# Patient Record
Sex: Male | Born: 1945 | Hispanic: Refuse to answer | Smoking: Former smoker
Health system: Southern US, Community
[De-identification: ages and names within clinical notes are randomized; demographics above are authoritative.]

## PROBLEM LIST (undated history)

## (undated) DIAGNOSIS — K0530 Chronic periodontitis, unspecified: Secondary | ICD-10-CM

## (undated) DIAGNOSIS — Q2381 Bicuspid aortic valve: Secondary | ICD-10-CM

## (undated) DIAGNOSIS — Z951 Presence of aortocoronary bypass graft: Secondary | ICD-10-CM

## (undated) DIAGNOSIS — J189 Pneumonia, unspecified organism: Secondary | ICD-10-CM

## (undated) DIAGNOSIS — I330 Acute and subacute infective endocarditis: Secondary | ICD-10-CM

## (undated) DIAGNOSIS — D649 Anemia, unspecified: Secondary | ICD-10-CM

## (undated) DIAGNOSIS — I2510 Atherosclerotic heart disease of native coronary artery without angina pectoris: Secondary | ICD-10-CM

## (undated) DIAGNOSIS — I7121 Aneurysm of the ascending aorta, without rupture: Secondary | ICD-10-CM

## (undated) DIAGNOSIS — Q231 Congenital insufficiency of aortic valve: Secondary | ICD-10-CM

## (undated) DIAGNOSIS — I351 Nonrheumatic aortic (valve) insufficiency: Secondary | ICD-10-CM

## (undated) DIAGNOSIS — I712 Thoracic aortic aneurysm, without rupture: Secondary | ICD-10-CM

## (undated) DIAGNOSIS — Z954 Presence of other heart-valve replacement: Secondary | ICD-10-CM

## (undated) DIAGNOSIS — Z8774 Personal history of (corrected) congenital malformations of heart and circulatory system: Secondary | ICD-10-CM

## (undated) HISTORY — PX: HERNIA REPAIR: SHX51

## (undated) HISTORY — DX: Acute and subacute infective endocarditis: I33.0

---

## 1976-04-13 DIAGNOSIS — J189 Pneumonia, unspecified organism: Secondary | ICD-10-CM

## 1976-04-13 HISTORY — DX: Pneumonia, unspecified organism: J18.9

## 2016-04-09 ENCOUNTER — Emergency Department (HOSPITAL_COMMUNITY): Payer: Medicare Other

## 2016-04-09 ENCOUNTER — Emergency Department (HOSPITAL_COMMUNITY)
Admission: EM | Admit: 2016-04-09 | Discharge: 2016-04-09 | Disposition: A | Discharge status: Home / Self Care | Payer: Medicare Other | Attending: Emergency Medicine | Admitting: Emergency Medicine

## 2016-04-09 ENCOUNTER — Encounter (HOSPITAL_COMMUNITY): Payer: Self-pay | Admitting: *Deleted

## 2016-04-09 DIAGNOSIS — J40 Bronchitis, not specified as acute or chronic: Secondary | ICD-10-CM | POA: Insufficient documentation

## 2016-04-09 DIAGNOSIS — R05 Cough: Secondary | ICD-10-CM | POA: Diagnosis not present

## 2016-04-09 DIAGNOSIS — Z87891 Personal history of nicotine dependence: Secondary | ICD-10-CM | POA: Insufficient documentation

## 2016-04-09 MED ORDER — GUAIFENESIN-CODEINE 100-10 MG/5ML PO SYRP: 10.0000 mL | ORAL_SOLUTION | Freq: Three times a day (TID) | ORAL | 0 refills | Status: DC | PRN

## 2016-04-09 MED ORDER — AZITHROMYCIN 250 MG PO TABS: ORAL_TABLET | ORAL | 0 refills | Status: DC

## 2016-04-09 MED ORDER — PREDNISONE 20 MG PO TABS: 40.0000 mg | ORAL_TABLET | Freq: Every day | ORAL | 0 refills | Status: DC

## 2016-04-09 MED ORDER — ALBUTEROL SULFATE HFA 108 (90 BASE) MCG/ACT IN AERS
2.0000 | INHALATION_SPRAY | Freq: Once | RESPIRATORY_TRACT | Status: AC
  Administered 2016-04-09: 2 via RESPIRATORY_TRACT
  Filled 2016-04-09: qty 6.7

## 2016-04-09 NOTE — Discharge Instructions (Signed)
2 puffs of the inhaler 4 times a day as needed.  Drink plenty of fluids.  Tylenol if needed for fever.  Follow-up with your doctor for recheck.

## 2016-04-09 NOTE — ED Triage Notes (Signed)
Pt comes in with cough and congestion starting one week ago. NAD noted in triage. Pt denies any n/v/d.

## 2016-04-13 NOTE — ED Provider Notes (Signed)
[place] DEPT Provider Note   CSN: [id number] Arrival date & time: 04/09/16  1150     History   Chief Complaint Chief Complaint  Patient presents with  . Cough    HPI Charles [name] is a 71 y.o. male.  HPI   Charles [name] is a 71 y.o. male who presents to the Emergency Department complaining of cough and chest congestion for one week.  He states cough is productive of dark sputum.  He also reports having associated chest and nasal congestion and "scratchy" throat.  He has tried OTC cold medication w/o relief.  He denies chest pain, shortness of breath and fever.     History reviewed. No pertinent past medical history.  There are no active problems to display for this patient.   Past Surgical History:  Procedure Laterality Date  . HERNIA REPAIR         Home Medications    Prior to Admission medications   Medication Sig Start Date End Date Taking? Authorizing Provider  azithromycin (ZITHROMAX) 250 MG tablet Take first 2 tablets together, then 1 every day until finished. 04/09/16   Kareem Aul, PA-C  guaiFENesin-codeine (ROBITUSSIN AC) 100-10 MG/5ML syrup Take 10 mLs by mouth 3 (three) times daily as needed. 04/09/16   Lousie Calico, PA-C  predniSONE (DELTASONE) 20 MG tablet Take 2 tablets (40 mg total) by mouth daily. For 5 days 04/09/16   Kem Parkinson, PA-C    Family History No family history on file.  Social History Social History  Substance Use Topics  . Smoking status: Former Research scientist (life sciences)  . Smokeless tobacco: Never Used  . Alcohol use No     Allergies   Patient has no known allergies.   Review of Systems Review of Systems  Constitutional: Negative for activity change, appetite change, chills and fever.  HENT: Positive for congestion, rhinorrhea and sore throat. Negative for facial swelling and trouble swallowing.   Eyes: Negative for visual disturbance.  Respiratory: Positive for cough. Negative for chest tightness, shortness of breath,  wheezing and stridor.   Gastrointestinal: Negative for abdominal pain, nausea and vomiting.  Musculoskeletal: Negative for neck pain and neck stiffness.  Skin: Negative.   Neurological: Negative for dizziness, weakness, numbness and headaches.  Hematological: Negative for adenopathy.  Psychiatric/Behavioral: Negative for confusion.  All other systems reviewed and are negative.    Physical Exam Updated Vital Signs BP 130/58 (BP Location: Left Arm)   Pulse 74   Temp 97.6 F (36.4 C) (Oral)   Resp 16   Ht 6' (1.829 m)   Wt 117.9 kg   SpO2 93%   BMI 35.26 kg/m   Physical Exam  Constitutional: He is oriented to person, place, and time. He appears well-developed and well-nourished. No distress.  HENT:  Head: Normocephalic and atraumatic.  Right Ear: Tympanic membrane and ear canal normal.  Left Ear: Tympanic membrane and ear canal normal.  Nose: Mucosal edema and rhinorrhea present.  Mouth/Throat: Uvula is midline and mucous membranes are normal. No trismus in the jaw. No uvula swelling. No oropharyngeal exudate, posterior oropharyngeal edema, posterior oropharyngeal erythema or tonsillar abscesses.  Eyes: Conjunctivae are normal.  Neck: Normal range of motion and phonation normal. Neck supple. No Brudzinski's sign and no Kernig's sign noted.  Cardiovascular: Normal rate, regular rhythm and intact distal pulses.   No murmur heard. Pulmonary/Chest: Effort normal and breath sounds normal. No respiratory distress. He has no wheezes. He has no rales.  Coarse lung sounds bilaterally with scattered expiratory wheezes.  No rales.    Abdominal: Soft. He exhibits no distension. There is no tenderness. There is no rebound and no guarding.  Musculoskeletal: He exhibits no edema.  Lymphadenopathy:    He has no cervical adenopathy.  Neurological: He is alert and oriented to person, place, and time. Coordination normal.  Skin: Skin is warm and dry.  Nursing note and vitals reviewed.    ED  Treatments / Results  Labs (all labs ordered are listed, but only abnormal results are displayed) Labs Reviewed - No data to display  EKG  EKG Interpretation None       Radiology Dg Chest 2 View  Result Date: 04/09/2016 CLINICAL DATA:  Cold with cough and congestion for 1 week EXAM: CHEST  2 VIEW COMPARISON:  None. FINDINGS: There is no focal consolidation. There is mild left basilar atelectasis. There is no pleural effusion or pneumothorax. The heart and mediastinal contours are unremarkable. The osseous structures are unremarkable. IMPRESSION: No active cardiopulmonary disease. Electronically Signed   By: Kathreen Devoid   On: 04/09/2016 12:55     Procedures Procedures (including critical care time)  Medications Ordered in ED Medications  albuterol (PROVENTIL HFA;VENTOLIN HFA) 108 (90 Base) MCG/ACT inhaler 2 puff (2 puffs Inhalation Given 04/09/16 1328)     Initial Impression / Assessment and Plan / ED Course  I have reviewed the triage vital signs and the nursing notes.  Pertinent labs & imaging results that were available during my care of the patient were reviewed by me and considered in my medical decision making (see chart for details).  Clinical Course     Pt non-toxic appearing.  Vitals stable.  CXR neg  For PNA.    Albuterol MDI dispensed, will tx with prednisone, z pack and anti-tussive.  Pt agrees to PMD f/u if needed.   Final Clinical Impressions(s) / ED Diagnoses   Final diagnoses:  Bronchitis    New Prescriptions Discharge Medication List as of 04/09/2016  1:21 PM    START taking these medications   Details  azithromycin (ZITHROMAX) 250 MG tablet Take first 2 tablets together, then 1 every day until finished., Print    guaiFENesin-codeine (ROBITUSSIN AC) 100-10 MG/5ML syrup Take 10 mLs by mouth 3 (three) times daily as needed., Starting Thu 04/09/2016, Print    predniSONE (DELTASONE) 20 MG tablet Take 2 tablets (40 mg total) by mouth daily. For 5  days, Starting Thu 04/09/2016, [place], PA-C 04/13/16 2015    Nat Christen, MD 04/14/16 [phone]

## 2016-04-14 DIAGNOSIS — Z6834 Body mass index (BMI) 34.0-34.9, adult: Secondary | ICD-10-CM | POA: Diagnosis not present

## 2016-04-14 DIAGNOSIS — R0602 Shortness of breath: Secondary | ICD-10-CM | POA: Diagnosis not present

## 2016-05-01 ENCOUNTER — Encounter (HOSPITAL_COMMUNITY): Payer: Self-pay | Admitting: Emergency Medicine

## 2016-05-01 ENCOUNTER — Emergency Department (HOSPITAL_COMMUNITY)
Admission: EM | Admit: 2016-05-01 | Discharge: 2016-05-01 | Disposition: A | Discharge status: Home / Self Care | Payer: Medicare Other | Attending: Dermatology | Admitting: Dermatology

## 2016-05-01 ENCOUNTER — Emergency Department (HOSPITAL_COMMUNITY): Payer: Medicare Other

## 2016-05-01 DIAGNOSIS — R509 Fever, unspecified: Secondary | ICD-10-CM | POA: Diagnosis not present

## 2016-05-01 DIAGNOSIS — Z5321 Procedure and treatment not carried out due to patient leaving prior to being seen by health care provider: Secondary | ICD-10-CM | POA: Diagnosis not present

## 2016-05-01 DIAGNOSIS — R05 Cough: Secondary | ICD-10-CM | POA: Diagnosis not present

## 2016-05-01 DIAGNOSIS — Z79899 Other long term (current) drug therapy: Secondary | ICD-10-CM | POA: Insufficient documentation

## 2016-05-01 DIAGNOSIS — Z87891 Personal history of nicotine dependence: Secondary | ICD-10-CM | POA: Diagnosis not present

## 2016-05-01 NOTE — ED Triage Notes (Signed)
Pt states he has had a fever since last night with generally not feeling well.  Denies other symptoms other than body aches.

## 2016-05-06 DIAGNOSIS — J060 Acute laryngopharyngitis: Secondary | ICD-10-CM | POA: Diagnosis not present

## 2016-05-06 DIAGNOSIS — R05 Cough: Secondary | ICD-10-CM | POA: Diagnosis not present

## 2016-05-06 DIAGNOSIS — Z6834 Body mass index (BMI) 34.0-34.9, adult: Secondary | ICD-10-CM | POA: Diagnosis not present

## 2016-05-25 DIAGNOSIS — Z6832 Body mass index (BMI) 32.0-32.9, adult: Secondary | ICD-10-CM | POA: Diagnosis not present

## 2016-05-25 DIAGNOSIS — J060 Acute laryngopharyngitis: Secondary | ICD-10-CM | POA: Diagnosis not present

## 2016-06-05 DIAGNOSIS — R5383 Other fatigue: Secondary | ICD-10-CM | POA: Diagnosis not present

## 2016-06-05 DIAGNOSIS — K219 Gastro-esophageal reflux disease without esophagitis: Secondary | ICD-10-CM | POA: Diagnosis not present

## 2016-06-05 DIAGNOSIS — R05 Cough: Secondary | ICD-10-CM | POA: Diagnosis not present

## 2016-06-05 DIAGNOSIS — R531 Weakness: Secondary | ICD-10-CM | POA: Diagnosis not present

## 2016-06-19 DIAGNOSIS — R5383 Other fatigue: Secondary | ICD-10-CM | POA: Diagnosis not present

## 2016-06-19 DIAGNOSIS — R05 Cough: Secondary | ICD-10-CM | POA: Diagnosis not present

## 2016-06-19 DIAGNOSIS — R61 Generalized hyperhidrosis: Secondary | ICD-10-CM | POA: Diagnosis not present

## 2016-06-23 DIAGNOSIS — R079 Chest pain, unspecified: Secondary | ICD-10-CM | POA: Diagnosis not present

## 2016-06-23 DIAGNOSIS — R05 Cough: Secondary | ICD-10-CM | POA: Diagnosis not present

## 2016-06-23 DIAGNOSIS — R531 Weakness: Secondary | ICD-10-CM | POA: Diagnosis not present

## 2016-06-23 DIAGNOSIS — R61 Generalized hyperhidrosis: Secondary | ICD-10-CM | POA: Diagnosis not present

## 2016-06-23 DIAGNOSIS — R5383 Other fatigue: Secondary | ICD-10-CM | POA: Diagnosis not present

## 2016-07-01 ENCOUNTER — Encounter (HOSPITAL_COMMUNITY): Payer: Self-pay | Admitting: Emergency Medicine

## 2016-07-01 ENCOUNTER — Emergency Department (HOSPITAL_COMMUNITY)
Admission: EM | Admit: 2016-07-01 | Discharge: 2016-07-02 | Disposition: A | Discharge status: Home / Self Care | Payer: Medicare Other | Source: Home / Self Care | Attending: Emergency Medicine | Admitting: Emergency Medicine

## 2016-07-01 ENCOUNTER — Emergency Department (HOSPITAL_COMMUNITY): Payer: Medicare Other

## 2016-07-01 DIAGNOSIS — R634 Abnormal weight loss: Secondary | ICD-10-CM

## 2016-07-01 DIAGNOSIS — R61 Generalized hyperhidrosis: Secondary | ICD-10-CM | POA: Diagnosis not present

## 2016-07-01 DIAGNOSIS — Y929 Unspecified place or not applicable: Secondary | ICD-10-CM

## 2016-07-01 DIAGNOSIS — Y939 Activity, unspecified: Secondary | ICD-10-CM | POA: Insufficient documentation

## 2016-07-01 DIAGNOSIS — D3501 Benign neoplasm of right adrenal gland: Secondary | ICD-10-CM

## 2016-07-01 DIAGNOSIS — I77819 Aortic ectasia, unspecified site: Secondary | ICD-10-CM

## 2016-07-01 DIAGNOSIS — Y999 Unspecified external cause status: Secondary | ICD-10-CM

## 2016-07-01 DIAGNOSIS — R6889 Other general symptoms and signs: Secondary | ICD-10-CM

## 2016-07-01 DIAGNOSIS — Z87891 Personal history of nicotine dependence: Secondary | ICD-10-CM | POA: Insufficient documentation

## 2016-07-01 DIAGNOSIS — D3500 Benign neoplasm of unspecified adrenal gland: Secondary | ICD-10-CM | POA: Diagnosis not present

## 2016-07-01 DIAGNOSIS — W01198A Fall on same level from slipping, tripping and stumbling with subsequent striking against other object, initial encounter: Secondary | ICD-10-CM | POA: Insufficient documentation

## 2016-07-01 DIAGNOSIS — S0990XA Unspecified injury of head, initial encounter: Secondary | ICD-10-CM

## 2016-07-01 HISTORY — DX: Anemia, unspecified: D64.9

## 2016-07-01 LAB — HEPATIC FUNCTION PANEL
ALK PHOS: 126 U/L (ref 38–126)
ALT: 17 U/L (ref 17–63)
AST: 21 U/L (ref 15–41)
Albumin: 3 g/dL — ABNORMAL LOW (ref 3.5–5.0)
BILIRUBIN TOTAL: 0.7 mg/dL (ref 0.3–1.2)
Bilirubin, Direct: 0.1 mg/dL (ref 0.1–0.5)
Indirect Bilirubin: 0.6 mg/dL (ref 0.3–0.9)
TOTAL PROTEIN: 7.6 g/dL (ref 6.5–8.1)

## 2016-07-01 LAB — URINALYSIS, ROUTINE W REFLEX MICROSCOPIC
Bilirubin Urine: NEGATIVE
Glucose, UA: NEGATIVE mg/dL
KETONES UR: 5 mg/dL — AB
LEUKOCYTES UA: NEGATIVE
Nitrite: NEGATIVE
PROTEIN: 100 mg/dL — AB
Specific Gravity, Urine: 1.026 (ref 1.005–1.030)
pH: 5 (ref 5.0–8.0)

## 2016-07-01 LAB — BASIC METABOLIC PANEL
ANION GAP: 9 (ref 5–15)
BUN: 27 mg/dL — ABNORMAL HIGH (ref 6–20)
CALCIUM: 8.5 mg/dL — AB (ref 8.9–10.3)
CO2: 24 mmol/L (ref 22–32)
CREATININE: 1.37 mg/dL — AB (ref 0.61–1.24)
Chloride: 101 mmol/L (ref 101–111)
GFR, EST AFRICAN AMERICAN: 60 mL/min — AB (ref >60)
GFR, EST NON AFRICAN AMERICAN: 51 mL/min — AB (ref >60)
Glucose, Bld: 148 mg/dL — ABNORMAL HIGH (ref 65–99)
Potassium: 3.9 mmol/L (ref 3.5–5.1)
SODIUM: 134 mmol/L — AB (ref 135–145)

## 2016-07-01 LAB — CBC WITH DIFFERENTIAL/PLATELET
BASOS ABS: 0 10*3/uL (ref 0.0–0.1)
BASOS PCT: 0 %
EOS ABS: 0 10*3/uL (ref 0.0–0.7)
Eosinophils Relative: 0 %
HCT: 32 % — ABNORMAL LOW (ref 39.0–52.0)
Hemoglobin: 10.2 g/dL — ABNORMAL LOW (ref 13.0–17.0)
Lymphocytes Relative: 8 %
Lymphs Abs: 1.3 10*3/uL (ref 0.7–4.0)
MCH: 25.6 pg — ABNORMAL LOW (ref 26.0–34.0)
MCHC: 31.9 g/dL (ref 30.0–36.0)
MCV: 80.4 fL (ref 78.0–100.0)
MONO ABS: 0.8 10*3/uL (ref 0.1–1.0)
Monocytes Relative: 5 %
NEUTROS ABS: 13.8 10*3/uL — AB (ref 1.7–7.7)
NEUTROS PCT: 87 %
Platelets: 299 10*3/uL (ref 150–400)
RBC: 3.98 MIL/uL — ABNORMAL LOW (ref 4.22–5.81)
RDW: 17 % — AB (ref 11.5–15.5)
WBC: 16 10*3/uL — ABNORMAL HIGH (ref 4.0–10.5)

## 2016-07-01 LAB — MAGNESIUM: MAGNESIUM: 2.3 mg/dL (ref 1.7–2.4)

## 2016-07-01 LAB — TSH: TSH: 1.3 u[IU]/mL (ref 0.350–4.500)
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
## 2016-07-02 ENCOUNTER — Inpatient Hospital Stay (HOSPITAL_COMMUNITY)
Admission: EM | Admit: 2016-07-02 | Discharge: 2016-07-09 | DRG: 871 | Disposition: A | Discharge status: Home Health | Payer: Medicare Other | Attending: Internal Medicine | Admitting: Internal Medicine

## 2016-07-02 ENCOUNTER — Emergency Department (HOSPITAL_COMMUNITY): Payer: Medicare Other

## 2016-07-02 ENCOUNTER — Encounter (HOSPITAL_COMMUNITY): Payer: Self-pay | Admitting: Emergency Medicine

## 2016-07-02 DIAGNOSIS — K760 Fatty (change of) liver, not elsewhere classified: Secondary | ICD-10-CM | POA: Diagnosis present

## 2016-07-02 DIAGNOSIS — Z9181 History of falling: Secondary | ICD-10-CM

## 2016-07-02 DIAGNOSIS — I248 Other forms of acute ischemic heart disease: Secondary | ICD-10-CM | POA: Diagnosis present

## 2016-07-02 DIAGNOSIS — B955 Unspecified streptococcus as the cause of diseases classified elsewhere: Secondary | ICD-10-CM | POA: Diagnosis not present

## 2016-07-02 DIAGNOSIS — B9689 Other specified bacterial agents as the cause of diseases classified elsewhere: Secondary | ICD-10-CM | POA: Diagnosis present

## 2016-07-02 DIAGNOSIS — E669 Obesity, unspecified: Secondary | ICD-10-CM | POA: Diagnosis present

## 2016-07-02 DIAGNOSIS — B954 Other streptococcus as the cause of diseases classified elsewhere: Secondary | ICD-10-CM | POA: Diagnosis present

## 2016-07-02 DIAGNOSIS — M545 Low back pain: Secondary | ICD-10-CM | POA: Diagnosis not present

## 2016-07-02 DIAGNOSIS — E43 Unspecified severe protein-calorie malnutrition: Secondary | ICD-10-CM | POA: Diagnosis present

## 2016-07-02 DIAGNOSIS — R634 Abnormal weight loss: Secondary | ICD-10-CM | POA: Diagnosis not present

## 2016-07-02 DIAGNOSIS — D508 Other iron deficiency anemias: Secondary | ICD-10-CM | POA: Diagnosis not present

## 2016-07-02 DIAGNOSIS — Z972 Presence of dental prosthetic device (complete) (partial): Secondary | ICD-10-CM | POA: Diagnosis not present

## 2016-07-02 DIAGNOSIS — I509 Heart failure, unspecified: Secondary | ICD-10-CM

## 2016-07-02 DIAGNOSIS — Z8041 Family history of malignant neoplasm of ovary: Secondary | ICD-10-CM | POA: Diagnosis not present

## 2016-07-02 DIAGNOSIS — I7121 Aneurysm of the ascending aorta, without rupture: Secondary | ICD-10-CM | POA: Diagnosis present

## 2016-07-02 DIAGNOSIS — I330 Acute and subacute infective endocarditis: Secondary | ICD-10-CM

## 2016-07-02 DIAGNOSIS — Z6831 Body mass index (BMI) 31.0-31.9, adult: Secondary | ICD-10-CM

## 2016-07-02 DIAGNOSIS — Z87891 Personal history of nicotine dependence: Secondary | ICD-10-CM | POA: Diagnosis not present

## 2016-07-02 DIAGNOSIS — Z01810 Encounter for preprocedural cardiovascular examination: Secondary | ICD-10-CM | POA: Diagnosis not present

## 2016-07-02 DIAGNOSIS — S0990XA Unspecified injury of head, initial encounter: Secondary | ICD-10-CM | POA: Diagnosis not present

## 2016-07-02 DIAGNOSIS — Q2381 Bicuspid aortic valve: Secondary | ICD-10-CM

## 2016-07-02 DIAGNOSIS — I712 Thoracic aortic aneurysm, without rupture: Secondary | ICD-10-CM | POA: Diagnosis not present

## 2016-07-02 DIAGNOSIS — I350 Nonrheumatic aortic (valve) stenosis: Secondary | ICD-10-CM | POA: Diagnosis not present

## 2016-07-02 DIAGNOSIS — Z79899 Other long term (current) drug therapy: Secondary | ICD-10-CM | POA: Diagnosis not present

## 2016-07-02 DIAGNOSIS — J9811 Atelectasis: Secondary | ICD-10-CM | POA: Diagnosis not present

## 2016-07-02 DIAGNOSIS — I214 Non-ST elevation (NSTEMI) myocardial infarction: Secondary | ICD-10-CM | POA: Diagnosis present

## 2016-07-02 DIAGNOSIS — Q231 Congenital insufficiency of aortic valve: Secondary | ICD-10-CM | POA: Diagnosis not present

## 2016-07-02 DIAGNOSIS — I339 Acute and subacute endocarditis, unspecified: Secondary | ICD-10-CM | POA: Diagnosis not present

## 2016-07-02 DIAGNOSIS — R61 Generalized hyperhidrosis: Secondary | ICD-10-CM | POA: Diagnosis not present

## 2016-07-02 DIAGNOSIS — R778 Other specified abnormalities of plasma proteins: Secondary | ICD-10-CM | POA: Diagnosis not present

## 2016-07-02 DIAGNOSIS — I441 Atrioventricular block, second degree: Secondary | ICD-10-CM | POA: Diagnosis not present

## 2016-07-02 DIAGNOSIS — I77819 Aortic ectasia, unspecified site: Secondary | ICD-10-CM | POA: Diagnosis not present

## 2016-07-02 DIAGNOSIS — R7881 Bacteremia: Principal | ICD-10-CM | POA: Diagnosis present

## 2016-07-02 DIAGNOSIS — Z8249 Family history of ischemic heart disease and other diseases of the circulatory system: Secondary | ICD-10-CM | POA: Diagnosis not present

## 2016-07-02 DIAGNOSIS — D3500 Benign neoplasm of unspecified adrenal gland: Secondary | ICD-10-CM | POA: Diagnosis not present

## 2016-07-02 DIAGNOSIS — D649 Anemia, unspecified: Secondary | ICD-10-CM | POA: Diagnosis not present

## 2016-07-02 DIAGNOSIS — R011 Cardiac murmur, unspecified: Secondary | ICD-10-CM | POA: Diagnosis not present

## 2016-07-02 DIAGNOSIS — D509 Iron deficiency anemia, unspecified: Secondary | ICD-10-CM | POA: Diagnosis not present

## 2016-07-02 DIAGNOSIS — I340 Nonrheumatic mitral (valve) insufficiency: Secondary | ICD-10-CM | POA: Diagnosis not present

## 2016-07-02 DIAGNOSIS — B351 Tinea unguium: Secondary | ICD-10-CM | POA: Diagnosis not present

## 2016-07-02 DIAGNOSIS — D72829 Elevated white blood cell count, unspecified: Secondary | ICD-10-CM | POA: Diagnosis not present

## 2016-07-02 DIAGNOSIS — I351 Nonrheumatic aortic (valve) insufficiency: Secondary | ICD-10-CM | POA: Diagnosis present

## 2016-07-02 DIAGNOSIS — R6884 Jaw pain: Secondary | ICD-10-CM | POA: Diagnosis not present

## 2016-07-02 DIAGNOSIS — Z8679 Personal history of other diseases of the circulatory system: Secondary | ICD-10-CM

## 2016-07-02 DIAGNOSIS — K0530 Chronic periodontitis, unspecified: Secondary | ICD-10-CM | POA: Diagnosis present

## 2016-07-02 HISTORY — DX: Thoracic aortic aneurysm, without rupture: I71.2

## 2016-07-02 HISTORY — DX: Bicuspid aortic valve: Q23.81

## 2016-07-02 HISTORY — DX: Aneurysm of the ascending aorta, without rupture: I71.21

## 2016-07-02 HISTORY — DX: Acute and subacute infective endocarditis: I33.0

## 2016-07-02 HISTORY — DX: Nonrheumatic aortic (valve) insufficiency: I35.1

## 2016-07-02 HISTORY — DX: Congenital insufficiency of aortic valve: Q23.1

## 2016-07-02 LAB — I-STAT CHEM 8, ED
BUN: 33 mg/dL — AB (ref 6–20)
CALCIUM ION: 1.16 mmol/L (ref 1.15–1.40)
CHLORIDE: 102 mmol/L (ref 101–111)
Creatinine, Ser: 1.2 mg/dL (ref 0.61–1.24)
Glucose, Bld: 162 mg/dL — ABNORMAL HIGH (ref 65–99)
HEMATOCRIT: 29 % — AB (ref 39.0–52.0)
Hemoglobin: 9.9 g/dL — ABNORMAL LOW (ref 13.0–17.0)
Potassium: 4.1 mmol/L (ref 3.5–5.1)
SODIUM: 138 mmol/L (ref 135–145)
TCO2: 26 mmol/L (ref 0–100)

## 2016-07-02 LAB — RAPID URINE DRUG SCREEN, HOSP PERFORMED
AMPHETAMINES: NOT DETECTED
BENZODIAZEPINES: NOT DETECTED
Barbiturates: NOT DETECTED
Cocaine: NOT DETECTED
OPIATES: NOT DETECTED
TETRAHYDROCANNABINOL: NOT DETECTED

## 2016-07-02 LAB — I-STAT CG4 LACTIC ACID, ED: LACTIC ACID, VENOUS: 1.21 mmol/L (ref 0.5–1.9)

## 2016-07-02 LAB — TROPONIN I: Troponin I: 0.89 ng/mL (ref <0.03)

## 2016-07-02 MED ORDER — IOPAMIDOL (ISOVUE-300) INJECTION 61%
75.0000 mL | Freq: Once | INTRAVENOUS | Status: AC | PRN
  Administered 2016-07-02: 75 mL via INTRAVENOUS

## 2016-07-02 MED ORDER — ATORVASTATIN CALCIUM 40 MG PO TABS
40.0000 mg | ORAL_TABLET | Freq: Every day | ORAL | Status: DC
  Administered 2016-07-02 – 2016-07-08 (×6): 40 mg via ORAL
  Filled 2016-07-02 (×7): qty 1

## 2016-07-02 MED ORDER — ACETAMINOPHEN 650 MG RE SUPP: 650.0000 mg | Freq: Four times a day (QID) | RECTAL | Status: DC | PRN

## 2016-07-02 MED ORDER — VANCOMYCIN HCL IN DEXTROSE 1-5 GM/200ML-% IV SOLN
1000.0000 mg | Freq: Once | INTRAVENOUS | Status: AC
  Administered 2016-07-02: 1000 mg via INTRAVENOUS
  Filled 2016-07-02: qty 200

## 2016-07-02 MED ORDER — SODIUM CHLORIDE 0.9% FLUSH
3.0000 mL | Freq: Two times a day (BID) | INTRAVENOUS | Status: DC
  Administered 2016-07-03 – 2016-07-06 (×6): 3 mL via INTRAVENOUS

## 2016-07-02 MED ORDER — SODIUM CHLORIDE 0.9 % IV SOLN
INTRAVENOUS | Status: DC
  Administered 2016-07-02: 19:00:00 via INTRAVENOUS

## 2016-07-02 MED ORDER — ASPIRIN 325 MG PO TABS
325.0000 mg | ORAL_TABLET | Freq: Once | ORAL | Status: AC
  Administered 2016-07-02: 325 mg via ORAL
  Filled 2016-07-02: qty 1

## 2016-07-02 MED ORDER — SENNOSIDES-DOCUSATE SODIUM 8.6-50 MG PO TABS: 1.0000 | ORAL_TABLET | Freq: Every evening | ORAL | Status: DC | PRN

## 2016-07-02 MED ORDER — SODIUM CHLORIDE 0.9 % IV BOLUS (SEPSIS)
250.0000 mL | Freq: Once | INTRAVENOUS | Status: AC
  Administered 2016-07-02: 250 mL via INTRAVENOUS

## 2016-07-02 MED ORDER — PIPERACILLIN-TAZOBACTAM 3.375 G IVPB 30 MIN
3.3750 g | Freq: Once | INTRAVENOUS | Status: AC
  Administered 2016-07-02: 3.375 g via INTRAVENOUS
  Filled 2016-07-02: qty 50

## 2016-07-02 MED ORDER — SODIUM CHLORIDE 0.9 % IV SOLN
INTRAVENOUS | Status: AC
  Administered 2016-07-02 – 2016-07-03 (×2): via INTRAVENOUS

## 2016-07-02 MED ORDER — ENSURE ENLIVE PO LIQD
237.0000 mL | Freq: Two times a day (BID) | ORAL | Status: DC
  Administered 2016-07-03 – 2016-07-09 (×8): 237 mL via ORAL

## 2016-07-02 MED ORDER — VANCOMYCIN HCL IN DEXTROSE 750-5 MG/150ML-% IV SOLN
750.0000 mg | Freq: Two times a day (BID) | INTRAVENOUS | Status: DC
  Administered 2016-07-03: 750 mg via INTRAVENOUS
  Filled 2016-07-02 (×7): qty 150

## 2016-07-02 MED ORDER — ONDANSETRON HCL 4 MG/2ML IJ SOLN: 4.0000 mg | Freq: Four times a day (QID) | INTRAMUSCULAR | Status: DC | PRN

## 2016-07-02 MED ORDER — ACETAMINOPHEN 325 MG PO TABS
650.0000 mg | ORAL_TABLET | Freq: Four times a day (QID) | ORAL | Status: DC | PRN
  Administered 2016-07-03 – 2016-07-08 (×10): 650 mg via ORAL
  Filled 2016-07-02 (×10): qty 2

## 2016-07-02 MED ORDER — ENOXAPARIN SODIUM 40 MG/0.4ML ~~LOC~~ SOLN
40.0000 mg | SUBCUTANEOUS | Status: DC
  Administered 2016-07-02 – 2016-07-08 (×7): 40 mg via SUBCUTANEOUS
  Filled 2016-07-02 (×7): qty 0.4

## 2016-07-02 MED ORDER — PIPERACILLIN-TAZOBACTAM 3.375 G IVPB
3.3750 g | Freq: Three times a day (TID) | INTRAVENOUS | Status: DC
  Administered 2016-07-02 – 2016-07-03 (×2): 3.375 g via INTRAVENOUS
  Filled 2016-07-02 (×2): qty 50

## 2016-07-02 MED ORDER — ONDANSETRON HCL 4 MG PO TABS: 4.0000 mg | ORAL_TABLET | Freq: Four times a day (QID) | ORAL | Status: DC | PRN

## 2016-07-02 MED ORDER — SODIUM CHLORIDE 0.9 % IV SOLN: INTRAVENOUS | Status: DC
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
## 2016-07-02 NOTE — H&P (Addendum)
History and Physical    Rohnan Bartleson JGG:[id number] DOB: [birth year] DOA: 07/02/2016  PCP: Wende Neighbors, MD  Patient coming from: Home  Chief Complaint: Night sweats and chills and weight loss.  HPI: Charles [name] is a 71 y.o. male with no significant medical history medical history. Agents initially presented to the ED yesterday- 07/01/16, with complaints of night sweats chills and 15 pound weight loss since his fall in December. Workup was unremarkable, so patient was sent home, but Blood cultures were drawn, now growing gram-positive cocci , shunt was called to come back to the hospital for admission.  In December he sleeps on ice and fell hitting the back of his head- he did not seek medical attention, and he feels he is been ill since that fall. He denies sustaining any bruises, endorses some neck pain and head pain, he does not exactly remember the fall, but his son was present.  Patient endorses drenching night sweats, chills intermittent since fall in december. He was diagnosed with the flu, by his PCP, and was started on some antibiotics. He presented to the ED in December and then in January and chest x-rays were unremarkable. Patient endorses weight dropped from 255-240, which he attributes to poor appetite that started after he took an antibiotic prescribed ?name.  He endorses intermittent chest pain that lasts 30 seconds over the past month, sometimes substernal right shoulder left shoulder back, unprovoked.  He has had a cough since his diagnosis of influenza, productive , but this has improved since he started taking over-the-counter Mucinex. No shortness of breath. He denies diarrhea, vomiting, dysuria, frequency , headaches, neck stiffness. Family history of ovarian cancer in mother. He has never had a colonoscopy. Denies IV drug use, denies alcohol use, quit smoking cigarettes over 20 years ago.  ED Course: Today the ED, vitals were stable, lactic acid was normal at 1.2, CT chest with  contrast- acute process, mildly dilated ascending aorta-4.4cm, fatty liver. Pt was started on IV vancomycin and Zosyn. Repeat blood cultures were drawn  Review of Systems:  SKIN- chronic rash lower extremities HEAD- No Headache or dizziness. EYES- No Vision loss, pain, redness, double or blurred vision. NEUROLOGIC- No Numbness, syncope, seizures or burning. [facility]- Denies depression or anxiety.  Past Medical History:  Diagnosis Date  . Anemia     Past Surgical History:  Procedure Laterality Date  . HERNIA REPAIR      Prior to Admission medications   Medication Sig Start Date End Date Taking? Authorizing Provider  ENSURE PLUS (ENSURE PLUS) LIQD Take 237 mLs by mouth daily.   Yes Historical Provider, MD  ferrous sulfate 325 (65 FE) MG tablet Take 325 mg by mouth daily with breakfast.   Yes Historical Provider, MD  Multiple Vitamin (MULTIVITAMIN WITH MINERALS) TABS tablet Take 1 tablet by mouth daily.   Yes Historical Provider, MD    Physical Exam: Vitals:   07/02/16 1551  BP: 138/65  Pulse: 92  Resp: 18  Temp: 97.8 F (36.6 C)  TempSrc: Oral  SpO2: 97%  Weight: 108.9 kg (240 lb)  Height: 6' (1.829 m)    Constitutional: NAD, calm, comfortable Vitals:   07/02/16 1551  BP: 138/65  Pulse: 92  Resp: 18  Temp: 97.8 F (36.6 C)  TempSrc: Oral  SpO2: 97%  Weight: 108.9 kg (240 lb)  Height: 6' (1.829 m)   Eyes: PERRL, lids and conjunctivae normal ENMT: Mucous membranes are mildly dry. Posterior pharynx clear of any exudate or lesions.Normal dentition.  Neck: normal, supple, no masses, no thyromegaly,. No cervical lymphadenopathy. Respiratory: clear to auscultation bilaterally, no wheezing, no crackles. Normal respiratory effort. No accessory muscle use.  Cardiovascular: Regular rate and rhythm, 2/6 Diastolic murmur ,  No extremity edema. 2+ pedal pulses. Abdomen: no tenderness, no masses palpated. No hepatosplenomegaly. Bowel sounds positive.  Musculoskeletal: no  clubbing / cyanosis. No joint deformity upper and lower extremities. Good ROM, no contractures. Normal muscle tone.  Skin: Chronic hyperpigmented patches bilateral lower extremities suggesting chronic stasis changes . No erythema or open wounds . Neurologic: CN 2-12 grossly intact. Sensation intact, DTR normal. Strength 5/5 in all 4.  Psychiatric: Normal judgment and insight. Alert and oriented x 3. Normal mood.   Labs on Admission: I have personally reviewed following labs and imaging studies  CBC:  Recent Labs Lab 07/01/16 2017 07/02/16 1744  WBC 16.0*  --   NEUTROABS 13.8*  --   HGB 10.2* 9.9*  HCT 32.0* 29.0*  MCV 80.4  --   PLT 299  --    Basic Metabolic Panel:  Recent Labs Lab 07/01/16 2017 07/01/16 2118 07/02/16 1744  NA 134*  --  138  K 3.9  --  4.1  CL 101  --  102  CO2 24  --   --   GLUCOSE 148*  --  162*  BUN 27*  --  33*  CREATININE 1.37*  --  1.20  CALCIUM 8.5*  --   --   MG  --  2.3  --    GFR: Estimated Creatinine Clearance: 75.1 mL/min (by C-G formula based on SCr of 1.2 mg/dL). Liver Function Tests:  Recent Labs Lab 07/01/16 2118  AST 21  ALT 17  ALKPHOS 126  BILITOT 0.7  PROT 7.6  ALBUMIN 3.0*   Thyroid Function Tests:  Recent Labs  07/01/16 2118  TSH 1.300   Urine analysis:    Component Value Date/Time   COLORURINE AMBER (A) 07/01/2016 2304   APPEARANCEUR HAZY (A) 07/01/2016 2304   LABSPEC 1.026 07/01/2016 2304   PHURINE 5.0 07/01/2016 2304   GLUCOSEU NEGATIVE 07/01/2016 2304   HGBUR MODERATE (A) 07/01/2016 2304   BILIRUBINUR NEGATIVE 07/01/2016 2304   KETONESUR 5 (A) 07/01/2016 2304   PROTEINUR 100 (A) 07/01/2016 2304   NITRITE NEGATIVE 07/01/2016 2304   LEUKOCYTESUR NEGATIVE 07/01/2016 2304    Radiological Exams on Admission: Dg Chest 2 View  Result Date: 07/01/2016 CLINICAL DATA:  71 year old male with night sweats and weight loss. EXAM: CHEST  2 VIEW COMPARISON:  Chest radiograph dated 05/01/2016 FINDINGS: The lungs  are clear. There is no pleural effusion or pneumothorax. The cardiac silhouette is within normal limits. The aorta is tortuous. There is prominence of the aortic root on the lateral projection which may be the artifactual and related to tortuosity. Aneurysmal dilatation of the ascending aorta or aortic root is not excluded. CT of the chest with contrast may provide better evaluation. No acute osseous pathology identified. IMPRESSION: 1. No acute cardiopulmonary process. 2. Tortuous aorta with dilated appearance of the ascending aorta/aortic root. CT with contrast may provide better evaluation. Electronically Signed   By: Anner Crete M.D.   On: 07/01/2016 23:21   Ct Head Wo Contrast  Result Date: 07/01/2016 CLINICAL DATA:  Fall in December not feeling well EXAM: CT HEAD WITHOUT CONTRAST TECHNIQUE: Contiguous axial images were obtained from the base of the skull through the vertex without intravenous contrast. COMPARISON:  None. FINDINGS: Brain: No acute territorial infarction, intracranial hemorrhage or  focal mass lesion is visualized. The ventricles are nonenlarged. Mild atrophy. Vascular: No hyperdense vessels. Scattered calcifications at the carotid siphons. Skull: Normal. Negative for fracture or focal lesion. Sinuses/Orbits: Minimal mucosal thickening in the ethmoid sinuses. Prior maxillary antrostomies. Tiny mucous retention cysts or small polyps in the maxillary sinuses. Other: None IMPRESSION: No CT evidence for acute intracranial abnormality. Electronically Signed   By: Donavan Foil M.D.   On: 07/01/2016 23:03   Ct Chest W Contrast  Result Date: 07/02/2016 CLINICAL DATA:  71 year old male with weight loss, night sweats, and abnormal chest x-ray. EXAM: CT CHEST WITH CONTRAST TECHNIQUE: Multidetector CT imaging of the chest was performed during intravenous contrast administration. CONTRAST:  71mL ISOVUE-300 IOPAMIDOL (ISOVUE-300) INJECTION 61% COMPARISON:  Chest radiograph dated 07/01/2016  FINDINGS: Cardiovascular: Top-normal cardiac size. No pericardial effusion. The thoracic aorta is tortuous. There is mild dilatation of the ascending aorta measuring up to 4.4 cm in diameter. No aortic dissection. The origins of the great vessels of the aortic arch appear patent. The central pulmonary arteries are grossly unremarkable. Evaluation of the pulmonary arteries is limited due to suboptimal opacification and timing of the contrast. Mediastinum/Nodes: There is no hilar or mediastinal adenopathy. The esophagus and the thyroid gland are grossly unremarkable. Lungs/Pleura: There is mild elevation of the left hemidiaphragm with linear left lung base atelectasis/ scarring. Pneumonia is less likely. There is no pleural effusion or pneumothorax. The central airways are patent. Upper Abdomen: There is apparent fatty infiltration of the liver. A 2.1 cm hypodense lesion in the caudate lobe is not well characterized but may represent a cyst or hemangioma. There is a 2.5 cm indeterminate right adrenal hypodense nodule, most likely an adenoma. The visualized upper abdomen is otherwise unremarkable. Musculoskeletal: Mild degenerative changes of spine. No acute osseous pathology. IMPRESSION: 1. No acute intrathoracic pathology. 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 Recommend annual imaging followup by CTA or MRA. This recommendation follows 2010 ACCF/AHA/AATS/ACR/ASA/SCA/SCAI/SIR/STS/SVM Guidelines for the Diagnosis and Management of Patients with Thoracic Aortic Disease. Circulation. 2010; 121: A250-N397 2. Fatty liver. A small hypodense lesion in the caudate lobe is indeterminate but likely represents a cyst or hemangioma. 3. Probable right adrenal adenoma. Electronically Signed   By: Anner Crete M.D.   On: 07/02/2016 01:14    EKG: None  Assessment/Plan Principal Problem:   Bacteremia Active Problems:   Anemia   H/O cardiac murmur   Aortic dilatation (HCC)  Bacteremia- ?  Source. Cultures growing gram-positive cocci. Denies IV drug use. Prior influenza infection, ?january with antibiotic treatment, ?post influenza staph infection. - Continue present broad spectrum IV antibiotics with vancomycin and Zosyn pending speciation - Follow-up blood cultures 3/21 - Follow up repeat blood cultures 3/22 - Pt has a diastolic murmur on exam, he has had this since childhood, CT showing dilated aorta, abnormal valves to be high risk for endocarditis, will order TTE,  - Recommend repeat blood cultures 48 hours after IV antibiotics, to document clearance of infection, possible TEE pending repeat blood cultures - IV hydration for 1 day  - UDS  Anemia- no colonoscopies. ?acuity. ?infection related. No acute blood loss. - Anemia panel - Follow up with PCP for colonoscopy.  NSTEMI- atypical chest pain, intermittent for the past month, lasting 30s, sometimes substernal, unprovoked. No chest pain now, but trop elevated 0.89, likely demand ischemia due to bacteremia. - Troponins 3 - EKG- fascicular block, 1st degree A_V block, EKG am - Lipid panel a.m. - Follow-up echo -  Aspirin 325mg  start  - Will start Lipitor 40mg   - Will hold off on heparin for now - Bp soft to low normal, will off BB - Consider cardiology consult am, if continues to trend up.  Aortic dilatation- possibly related to patient's diastolic murmur. CT chest with contrast - No acute intrathoracic pathology. 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 Recommend annual imaging followup by CTA or MRA. 2. Fatty liver. A small hypodense lesion in the caudate lobe is indeterminate but likely represents a cyst or hemangioma. 3.Probable right adrenal adenoma. - Follow up with PCP for repeat annual imaging.    DVT prophylaxis: Lovenox. Code Status: Full Family Communication:  Disposition Plan: Home  Consults called: None  Admission status: Inpt, tele.   Bethena Roys MD [facility] Pager [phone].  If 7PM-7AM, please contact night-coverage [email or web address] Password [email or web address]  07/02/2016, 6:18 PM

## 2016-07-02 NOTE — ED Provider Notes (Addendum)
[facility] DEPT Provider Note   CSN: [id number] Arrival date & time: 07/02/16  1545     History   Chief Complaint Chief Complaint  Patient presents with  . Abnormal Lab    HPI Charles [name] is a 71 y.o. male.  Patient seen yesterday for symptoms of an ongoing for several weeks. Workup was negative other than the leukocytosis. Patient did have blood cultures done. 2 of them turn positive for gram-positive cocci. Patient called to return back. Patient not any worse and is been for the last several weeks.      Past Medical History:  Diagnosis Date  . Anemia     There are no active problems to display for this patient.   Past Surgical History:  Procedure Laterality Date  . HERNIA REPAIR         Home Medications    Prior to Admission medications   Medication Sig Start Date End Date Taking? Authorizing Provider  ENSURE PLUS (ENSURE PLUS) LIQD Take 237 mLs by mouth daily.   Yes Historical Provider, MD  ferrous sulfate 325 (65 FE) MG tablet Take 325 mg by mouth daily with breakfast.   Yes Historical Provider, MD  Multiple Vitamin (MULTIVITAMIN WITH MINERALS) TABS tablet Take 1 tablet by mouth daily.   Yes Historical Provider, MD    Family History History reviewed. No pertinent family history.  Social History Social History  Substance Use Topics  . Smoking status: Former Research scientist (life sciences)  . Smokeless tobacco: Never Used  . Alcohol use No     Allergies   Patient has no known allergies.   Review of Systems Review of Systems  Constitutional: Positive for diaphoresis, fatigue and unexpected weight change.  HENT: Negative for congestion.   Eyes: Negative for visual disturbance.  Respiratory: Negative for shortness of breath.   Cardiovascular: Negative for chest pain.  Gastrointestinal: Positive for nausea. Negative for abdominal pain and vomiting.  Genitourinary: Negative for dysuria and hematuria.  Musculoskeletal: Negative for back pain.  Skin: Negative for  rash.  Neurological: Negative for headaches.  Hematological: Does not bruise/bleed easily.  Psychiatric/Behavioral: Positive for sleep disturbance. Negative for confusion.     Physical Exam Updated Vital Signs BP 138/65 (BP Location: Right Arm)   Pulse 92   Temp 97.8 F (36.6 C) (Oral)   Resp 18   Ht 6' (1.829 m)   Wt 108.9 kg   SpO2 97%   BMI 32.55 kg/m   Physical Exam  Constitutional: He is oriented to person, place, and time. He appears well-developed and well-nourished. No distress.  HENT:  Head: Normocephalic and atraumatic.  Mouth/Throat: Oropharynx is clear and moist.  Eyes: EOM are normal. Pupils are equal, round, and reactive to light.  Neck: Normal range of motion. Neck supple.  Cardiovascular: Normal rate, regular rhythm and normal heart sounds.   No murmur heard. Pulmonary/Chest: Effort normal and breath sounds normal.  Abdominal: Soft. Bowel sounds are normal. There is no tenderness.  Musculoskeletal: Normal range of motion. He exhibits no edema.  Neurological: He is alert and oriented to person, place, and time. No cranial nerve deficit or sensory deficit. He exhibits normal muscle tone. Coordination normal.  Skin: Skin is warm.  Nursing note and vitals reviewed.    ED Treatments / Results  Labs (all labs ordered are listed, but only abnormal results are displayed) Labs Reviewed  CULTURE, BLOOD (ROUTINE X 2)  CULTURE, BLOOD (ROUTINE X 2)  I-STAT CG4 LACTIC ACID, ED  I-STAT CHEM 8, ED  EKG  EKG Interpretation None       Radiology Dg Chest 2 View  Result Date: 07/01/2016 CLINICAL DATA:  71 year old male with night sweats and weight loss. EXAM: CHEST  2 VIEW COMPARISON:  Chest radiograph dated 05/01/2016 FINDINGS: The lungs are clear. There is no pleural effusion or pneumothorax. The cardiac silhouette is within normal limits. The aorta is tortuous. There is prominence of the aortic root on the lateral projection which may be the artifactual and  related to tortuosity. Aneurysmal dilatation of the ascending aorta or aortic root is not excluded. CT of the chest with contrast may provide better evaluation. No acute osseous pathology identified. IMPRESSION: 1. No acute cardiopulmonary process. 2. Tortuous aorta with dilated appearance of the ascending aorta/aortic root. CT with contrast may provide better evaluation. Electronically Signed   By: Anner Crete M.D.   On: 07/01/2016 23:21   Ct Head Wo Contrast  Result Date: 07/01/2016 CLINICAL DATA:  Fall in December not feeling well EXAM: CT HEAD WITHOUT CONTRAST TECHNIQUE: Contiguous axial images were obtained from the base of the skull through the vertex without intravenous contrast. COMPARISON:  None. FINDINGS: Brain: No acute territorial infarction, intracranial hemorrhage or focal mass lesion is visualized. The ventricles are nonenlarged. Mild atrophy. Vascular: No hyperdense vessels. Scattered calcifications at the carotid siphons. Skull: Normal. Negative for fracture or focal lesion. Sinuses/Orbits: Minimal mucosal thickening in the ethmoid sinuses. Prior maxillary antrostomies. Tiny mucous retention cysts or small polyps in the maxillary sinuses. Other: None IMPRESSION: No CT evidence for acute intracranial abnormality. Electronically Signed   By: Donavan Foil M.D.   On: 07/01/2016 23:03   Ct Chest W Contrast  Result Date: 07/02/2016 CLINICAL DATA:  71 year old male with weight loss, night sweats, and abnormal chest x-ray. EXAM: CT CHEST WITH CONTRAST TECHNIQUE: Multidetector CT imaging of the chest was performed during intravenous contrast administration. CONTRAST:  27mL ISOVUE-300 IOPAMIDOL (ISOVUE-300) INJECTION 61% COMPARISON:  Chest radiograph dated 07/01/2016 FINDINGS: Cardiovascular: Top-normal cardiac size. No pericardial effusion. The thoracic aorta is tortuous. There is mild dilatation of the ascending aorta measuring up to 4.4 cm in diameter. No aortic dissection. The origins of the  great vessels of the aortic arch appear patent. The central pulmonary arteries are grossly unremarkable. Evaluation of the pulmonary arteries is limited due to suboptimal opacification and timing of the contrast. Mediastinum/Nodes: There is no hilar or mediastinal adenopathy. The esophagus and the thyroid gland are grossly unremarkable. Lungs/Pleura: There is mild elevation of the left hemidiaphragm with linear left lung base atelectasis/ scarring. Pneumonia is less likely. There is no pleural effusion or pneumothorax. The central airways are patent. Upper Abdomen: There is apparent fatty infiltration of the liver. A 2.1 cm hypodense lesion in the caudate lobe is not well characterized but may represent a cyst or hemangioma. There is a 2.5 cm indeterminate right adrenal hypodense nodule, most likely an adenoma. The visualized upper abdomen is otherwise unremarkable. Musculoskeletal: Mild degenerative changes of spine. No acute osseous pathology. IMPRESSION: 1. No acute intrathoracic pathology. 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 Recommend annual imaging followup by CTA or MRA. This recommendation follows 2010 ACCF/AHA/AATS/ACR/ASA/SCA/SCAI/SIR/STS/SVM Guidelines for the Diagnosis and Management of Patients with Thoracic Aortic Disease. Circulation. 2010; 121: Q469-G295 2. Fatty liver. A small hypodense lesion in the caudate lobe is indeterminate but likely represents a cyst or hemangioma. 3. Probable right adrenal adenoma. Electronically Signed   By: Anner Crete M.D.   On: 07/02/2016 01:14  Results for orders placed or performed during the hospital encounter of 07/01/16  Blood culture (routine x 2)  Result Value Ref Range   Specimen Description BLOOD RIGHT HAND    Special Requests BOTTLES DRAWN AEROBIC AND ANAEROBIC [place]    Culture  Setup Time      GRAM POSITIVE COCCI Gram Stain Report Called to,Read Back By and Verified With: MINTER,R ON 07/02/16 @1515  BY MITCHELL,S     Culture PENDING    Report Status PENDING   Blood culture (routine x 2)  Result Value Ref Range   Specimen Description BLOOD RIGHT ARM    Special Requests BOTTLES DRAWN AEROBIC AND ANAEROBIC [place]    Culture  Setup Time      GRAM POSITIVE COCCI Gram Stain Report Called to,Read Back By and Verified With: BECCA MINTER RN @ 2355 ON 3.22.18 BY BAYSE,L    Culture PENDING    Report Status PENDING   CBC with Differential  Result Value Ref Range   WBC 16.0 (H) 4.0 - 10.5 K/uL   RBC 3.98 (L) 4.22 - 5.81 MIL/uL   Hemoglobin 10.2 (L) 13.0 - 17.0 g/dL   HCT 32.0 (L) 39.0 - 52.0 %   MCV 80.4 78.0 - 100.0 fL   MCH 25.6 (L) 26.0 - 34.0 pg   MCHC 31.9 30.0 - 36.0 g/dL   RDW 17.0 (H) 11.5 - 15.5 %   Platelets 299 150 - 400 K/uL   Neutrophils Relative % 87 %   Neutro Abs 13.8 (H) 1.7 - 7.7 K/uL   Lymphocytes Relative 8 %   Lymphs Abs 1.3 0.7 - 4.0 K/uL   Monocytes Relative 5 %   Monocytes Absolute 0.8 0.1 - 1.0 K/uL   Eosinophils Relative 0 %   Eosinophils Absolute 0.0 0.0 - 0.7 K/uL   Basophils Relative 0 %   Basophils Absolute 0.0 0.0 - 0.1 K/uL  Basic metabolic panel  Result Value Ref Range   Sodium 134 (L) 135 - 145 mmol/L   Potassium 3.9 3.5 - 5.1 mmol/L   Chloride 101 101 - 111 mmol/L   CO2 24 22 - 32 mmol/L   Glucose, Bld 148 (H) 65 - 99 mg/dL   BUN 27 (H) 6 - 20 mg/dL   Creatinine, Ser 1.37 (H) 0.61 - 1.24 mg/dL   Calcium 8.5 (L) 8.9 - 10.3 mg/dL   GFR calc non Af Amer 51 (L) >60 mL/min   GFR calc Af Amer 60 (L) >60 mL/min   Anion gap 9 5 - 15  Hepatic function panel  Result Value Ref Range   Total Protein 7.6 6.5 - 8.1 g/dL   Albumin 3.0 (L) 3.5 - 5.0 g/dL   AST 21 15 - 41 U/L   ALT 17 17 - 63 U/L   Alkaline Phosphatase 126 38 - 126 U/L   Total Bilirubin 0.7 0.3 - 1.2 mg/dL   Bilirubin, Direct 0.1 0.1 - 0.5 mg/dL   Indirect Bilirubin 0.6 0.3 - 0.9 mg/dL  TSH  Result Value Ref Range   TSH 1.300 0.350 - 4.500 uIU/mL  Urinalysis, Routine w reflex microscopic  Result Value  Ref Range   Color, Urine AMBER (A) YELLOW   APPearance HAZY (A) CLEAR   Specific Gravity, Urine 1.026 1.005 - 1.030   pH 5.0 5.0 - 8.0   Glucose, UA NEGATIVE NEGATIVE mg/dL   Hgb urine dipstick MODERATE (A) NEGATIVE   Bilirubin Urine NEGATIVE NEGATIVE   Ketones, ur 5 (A) NEGATIVE mg/dL   Protein,  ur 100 (A) NEGATIVE mg/dL   Nitrite NEGATIVE NEGATIVE   Leukocytes, UA NEGATIVE NEGATIVE   RBC / HPF 6-30 0 - 5 RBC/hpf   WBC, UA 0-5 0 - 5 WBC/hpf   Bacteria, UA RARE (A) NONE SEEN   Mucous PRESENT   Magnesium  Result Value Ref Range   Magnesium 2.3 1.7 - 2.4 mg/dL     Procedures Procedures (including critical care time)  Medications Ordered in ED Medications  sodium chloride 0.9 % bolus 250 mL (not administered)  piperacillin-tazobactam (ZOSYN) IVPB 3.375 g (not administered)  vancomycin (VANCOCIN) IVPB 1000 mg/200 mL premix (not administered)  0.9 %  sodium chloride infusion (not administered)     Initial Impression / Assessment and Plan / ED Course  I have reviewed the triage vital signs and the nursing notes.  Pertinent labs & imaging results that were available during my care of the patient were reviewed by me and considered in my medical decision making (see chart for details).     Patient has to return today for positive blood cultures 2 both growing gram-positive cocci so most likely accurate. Patient with symptoms over the last several weeks could be consistent with a bacteremia. Yesterday's labs x-ray and CT scans reviewed. Discussed with hospitalist they will admit. Started on Zosyn and vancomycin here. Extra blood cultures done today. Lactic acid also ordered.   Vital signs nontoxic no acute distress.    Final Clinical Impressions(s) / ED Diagnoses   Final diagnoses:  Bacteremia    New Prescriptions New Prescriptions   No medications on file     Fredia Sorrow, MD 07/02/16 [place], MD 07/02/16 1702

## 2016-07-02 NOTE — ED Notes (Signed)
Micro called at this time and Dr. Loraine Maple made aware that pt's anaerobic bottles (both of them) came back positive for gram cocci. PT was contacted at this time and stated he would return to the ED for further evaluation today.

## 2016-07-02 NOTE — Discharge Instructions (Signed)
Your lab tests and imaging tonight does not give a specific reason for your symptoms.  You do have mild anemia (hemoglobin is 10.2 with normal range 13-17) and you are slightly dehydrated.  You should concentrate on drinking more fluids.   Your thyroid function appears normal, liver and kidneys appear ok.  You have an incidental finding on your CT scan of a right adrenal nodule (which is most likely an adrenal adenoma or benign, non cancerous tumor).  There are some adrenal tumors however that can secrete hormones that can cause sweating, heart palpitations and potentially weight loss.  This should be followed up by your primary doctor.  Also, the slightly enlarged aorta as discussed is ok, but is recommended that you also have this checked yearly to make sure it is not changing.  Your blood cultures are pending and you will be notified if any positive result is obtained.

## 2016-07-02 NOTE — ED Notes (Signed)
Pt verbalized understanding of discharge instructions. Pt wheeled to car in parking lot.

## 2016-07-02 NOTE — ED Notes (Signed)
  Positive gram positive cocci from blood culture bottles from last night. MD notified.

## 2016-07-02 NOTE — ED Triage Notes (Signed)
PT was told to come back to ED for further evaluation d/t both anaerobic bottles coming back positive for gram cocci bacteria. PT denies any complaints today.

## 2016-07-02 NOTE — Progress Notes (Signed)
Pharmacy Antibiotic Note  Charles [name] is a 71 y.o. male admitted on 07/02/2016 with bacteremia.  Pharmacy has been consulted for Vancomycin and Zosyn dosing.  Initial doses already given.  Obesity/Normalized CrCl Vancomycin dosing protocol will be initiated with an estimated normalized CrCl = 59.3 ml/min.   Plan: Additional Vancomycin 1gm for total 2gm load. Vancomycin 750mg  IV every 12 hours.  Goal trough 15-20 mcg/mL. Zosyn 3.375g IV q8h (4 hour infusion).  Monitor labs, micro and vitals.   Height: 6' (182.9 cm) Weight: 240 lb (108.9 kg) IBW/kg (Calculated) : 77.6  Temp (24hrs), Avg:98.5 F (36.9 C), Min:97.8 F (36.6 C), Max:99.1 F (37.3 C)   Recent Labs Lab 07/01/16 2017 07/02/16 1744  WBC 16.0*  --   CREATININE 1.37* 1.20  LATICACIDVEN  --  1.21    Estimated Creatinine Clearance: 75.1 mL/min (by C-G formula based on SCr of 1.2 mg/dL).    No Known Allergies  Antimicrobials this admission: Vanc 3/22 >>  Zosyn 3/22 >>   Dose adjustments this admission: n/a   Microbiology results: 3/22 BCx: Gram + Cocci 3/22 Repeat Bcx: Pending  Thank you for allowing pharmacy to be a part of this patient's care.  Pricilla Larsson 07/02/2016 7:46 PM

## 2016-07-03 ENCOUNTER — Inpatient Hospital Stay (HOSPITAL_COMMUNITY): Payer: Medicare Other

## 2016-07-03 DIAGNOSIS — R011 Cardiac murmur, unspecified: Secondary | ICD-10-CM

## 2016-07-03 DIAGNOSIS — E43 Unspecified severe protein-calorie malnutrition: Secondary | ICD-10-CM | POA: Diagnosis present

## 2016-07-03 LAB — ECHOCARDIOGRAM COMPLETE
AO mean calculated velocity dopler: 98.5 cm/s
AOVTI: 30.6 cm
AV Area VTI: 4.43 cm2
AV Mean grad: 5 mmHg
AV Peak grad: 8 mmHg
AV VEL mean LVOT/AV: 0.93
AV pk vel: 145 cm/s
AVAREAMEANV: 4.2 cm2
AVAREAMEANVIN: 1.8 cm2/m2
AVAREAVTIIND: 1.95 cm2/m2
AVLVOTPG: 8 mmHg
Ao pk vel: 0.98 m/s
CHL CUP AV PEAK INDEX: 1.89
CHL CUP AV VEL: 4.55
CHL CUP RV SYS PRESS: 23 mmHg
CHL CUP TV REG PEAK VELOCITY: 224 cm/s
E decel time: 176 msec
EERAT: 14.69
FS: 33 % (ref 28–44)
Height: 72 in
IVS/LV PW RATIO, ED: 1.18
LA ID, A-P, ES: 31 mm
LA vol index: 25.6 mL/m2
LADIAMINDEX: 1.33 cm/m2
LAVOL: 59.8 mL
LAVOLA4C: 55.2 mL
LEFT ATRIUM END SYS DIAM: 31 mm
LV E/e'average: 14.69
LV dias vol index: 56 mL/m2
LV dias vol: 132 mL (ref 62–150)
LVEEMED: 14.69
LVELAT: 7.83 cm/s
LVOT SV: 139 mL
LVOT VTI: 30.8 cm
LVOT area: 4.52 cm2
LVOT diameter: 24 mm
LVOTPV: 142 cm/s
LVOTVTI: 1.01 cm
LVSYSVOL: 53 mL
LVSYSVOLIN: 23 mL/m2
MV Dec: 176
MV Peak grad: 5 mmHg
MV pk E vel: 115 m/s
MVPKAVEL: 116 m/s
P 1/2 time: 383 ms
PW: 11.9 mm — AB (ref 0.6–1.1)
RV LATERAL S' VELOCITY: 15.1 cm/s
RV TAPSE: 19.8 mm
Simpson's disk: 60
Stroke v: 79 ml
TDI e' lateral: 7.83
TDI e' medial: 5.66
TRMAXVEL: 224 cm/s
Valve area index: 1.95
Valve area: 4.55 cm2
Weight: 3707.2 oz

## 2016-07-03 LAB — CBC
HCT: 27.1 % — ABNORMAL LOW (ref 39.0–52.0)
Hemoglobin: 8.5 g/dL — ABNORMAL LOW (ref 13.0–17.0)
MCH: 24.9 pg — AB (ref 26.0–34.0)
MCHC: 31.4 g/dL (ref 30.0–36.0)
MCV: 79.2 fL (ref 78.0–100.0)
PLATELETS: 237 10*3/uL (ref 150–400)
RBC: 3.42 MIL/uL — AB (ref 4.22–5.81)
RDW: 16.8 % — AB (ref 11.5–15.5)
WBC: 12.9 10*3/uL — ABNORMAL HIGH (ref 4.0–10.5)

## 2016-07-03 LAB — LIPID PANEL
CHOL/HDL RATIO: 7.6 ratio
Cholesterol: 122 mg/dL (ref 0–200)
HDL: 16 mg/dL — ABNORMAL LOW (ref >40)
LDL Cholesterol: 81 mg/dL (ref 0–99)
Triglycerides: 125 mg/dL (ref <150)
VLDL: 25 mg/dL (ref 0–40)

## 2016-07-03 LAB — BASIC METABOLIC PANEL
Anion gap: 6 (ref 5–15)
BUN: 23 mg/dL — AB (ref 6–20)
CHLORIDE: 106 mmol/L (ref 101–111)
CO2: 24 mmol/L (ref 22–32)
CREATININE: 1.19 mg/dL (ref 0.61–1.24)
Calcium: 8 mg/dL — ABNORMAL LOW (ref 8.9–10.3)
GFR calc Af Amer: >60 mL/min (ref >60)
GFR calc non Af Amer: >60 mL/min (ref >60)
GLUCOSE: 123 mg/dL — AB (ref 65–99)
Potassium: 3.9 mmol/L (ref 3.5–5.1)
Sodium: 136 mmol/L (ref 135–145)

## 2016-07-03 LAB — BLOOD CULTURE ID PANEL (REFLEXED)
Acinetobacter baumannii: NOT DETECTED
Candida albicans: NOT DETECTED
Candida glabrata: NOT DETECTED
Candida krusei: NOT DETECTED
Candida parapsilosis: NOT DETECTED
Candida tropicalis: NOT DETECTED
ENTEROBACTERIACEAE SPECIES: NOT DETECTED
Enterobacter cloacae complex: NOT DETECTED
Enterococcus species: NOT DETECTED
Escherichia coli: NOT DETECTED
Haemophilus influenzae: NOT DETECTED
Klebsiella oxytoca: NOT DETECTED
Klebsiella pneumoniae: NOT DETECTED
LISTERIA MONOCYTOGENES: NOT DETECTED
NEISSERIA MENINGITIDIS: NOT DETECTED
PROTEUS SPECIES: NOT DETECTED
PSEUDOMONAS AERUGINOSA: NOT DETECTED
SERRATIA MARCESCENS: NOT DETECTED
STAPHYLOCOCCUS AUREUS BCID: NOT DETECTED
STAPHYLOCOCCUS SPECIES: NOT DETECTED
STREPTOCOCCUS AGALACTIAE: NOT DETECTED
Streptococcus pneumoniae: NOT DETECTED
Streptococcus pyogenes: NOT DETECTED
Streptococcus species: DETECTED — AB

## 2016-07-03 LAB — IRON AND TIBC
Iron: 37 ug/dL — ABNORMAL LOW (ref 45–182)
Saturation Ratios: 16 % — ABNORMAL LOW (ref 17.9–39.5)
TIBC: 238 ug/dL — AB (ref 250–450)
UIBC: 201 ug/dL

## 2016-07-03 LAB — TROPONIN I
TROPONIN I: 1.01 ng/mL — AB (ref <0.03)
TROPONIN I: 0.78 ng/mL — AB (ref <0.03)

## 2016-07-03 LAB — FERRITIN: Ferritin: 443 ng/mL — ABNORMAL HIGH (ref 24–336)

## 2016-07-03 MED ORDER — BOOST / RESOURCE BREEZE PO LIQD
1.0000 | Freq: Three times a day (TID) | ORAL | Status: DC
  Administered 2016-07-03: 1 via ORAL
  Administered 2016-07-04: 237 mL via ORAL
  Administered 2016-07-05: 1 via ORAL
  Administered 2016-07-05 – 2016-07-06 (×2): 237 mL via ORAL
  Administered 2016-07-06 – 2016-07-09 (×6): 1 via ORAL

## 2016-07-03 MED ORDER — DEXTROSE 5 % IV SOLN
2.0000 g | INTRAVENOUS | Status: DC
  Administered 2016-07-03 – 2016-07-04 (×2): 2 g via INTRAVENOUS
  Filled 2016-07-03 (×4): qty 2

## 2016-07-03 MED ORDER — PRO-STAT SUGAR FREE PO LIQD
30.0000 mL | Freq: Two times a day (BID) | ORAL | Status: DC
  Administered 2016-07-03 – 2016-07-09 (×12): 30 mL via ORAL
  Filled 2016-07-03 (×13): qty 30

## 2016-07-03 NOTE — Progress Notes (Signed)
CRITICAL VALUE ALERT  Critical value received:  Blood culture species identified as Strep species  Date of notification: 3//23/18  Time of notification:  0041  Critical value read back:Yes.    Nurse who received alert:  Concha Pyo, RN  MD notified (1st page):  Opyd  Time of first page:  0045  MD notified (2nd page):  Time of second page:  Responding MD:  Opyd  Time MD responded:  7159 No new orders given at this time.  Pt is on sufficient antibiotic coverage.

## 2016-07-03 NOTE — Progress Notes (Signed)
*  PRELIMINARY RESULTS* Echocardiogram 2D Echocardiogram has been performed.  Samuel Germany 07/03/2016, 2:37 PM

## 2016-07-03 NOTE — Care Management Note (Signed)
Case Management Note  Patient Details  Name: Hosteen Kienast MRN: [id number] Date of Birth: [birth year]  Subjective/Objective:    Adm with bacteremia. Patient reports being from home with "in laws". He reports ind with ADL's, no DME. He states he has a PCP (Dr. Nevada Crane), drives himself to appointments, no HH PTA.                Action/Plan: Anticipate DC home with self care. CM will follow for ongoing needs. ? IV antibiotics.   Expected Discharge Date:       07/06/2016           Expected Discharge Plan:  Home/Self Care  In-House Referral:     Discharge planning Services  CM Consult  Post Acute Care Choice:  NA Choice offered to:  NA  DME Arranged:    DME Agency:     HH Arranged:    HH Agency:     Status of Service:  Completed, signed off  If discussed at [employer] of Stay Meetings, dates discussed:    Additional Comments:  Farley Crooker, Chauncey Reading, RN 07/03/2016, 11:32 AM

## 2016-07-03 NOTE — Progress Notes (Signed)
CRITICAL VALUE ALERT  Critical value received:  Troponin=0.89  Date of notification:  07/02/16  Time of notification:  2140  Critical value read back:Yes.    Nurse who received alert:  Concha Pyo, RN  MD notified (1st page):  Dr. Denton Brick  Time of first page:  2150  MD notified (2nd page):  Time of second page:  Responding MD:  [facility]  Time MD responded: 2226 New orders placed in [facility].  Nursing staff to continue

## 2016-07-03 NOTE — Progress Notes (Signed)
PHARMACY - PHYSICIAN COMMUNICATION CRITICAL VALUE ALERT - BLOOD CULTURE IDENTIFICATION (BCID)  Results for orders placed or performed during the hospital encounter of 07/01/16  Blood Culture ID Panel (Reflexed) (Collected: 07/02/2016 12:17 AM)  Result Value Ref Range   Enterococcus species NOT DETECTED NOT DETECTED   Listeria monocytogenes NOT DETECTED NOT DETECTED   Staphylococcus species NOT DETECTED NOT DETECTED   Staphylococcus aureus NOT DETECTED NOT DETECTED   Streptococcus species DETECTED (A) NOT DETECTED   Streptococcus agalactiae NOT DETECTED NOT DETECTED   Streptococcus pneumoniae NOT DETECTED NOT DETECTED   Streptococcus pyogenes NOT DETECTED NOT DETECTED   Acinetobacter baumannii NOT DETECTED NOT DETECTED   Enterobacteriaceae species NOT DETECTED NOT DETECTED   Enterobacter cloacae complex NOT DETECTED NOT DETECTED   Escherichia coli NOT DETECTED NOT DETECTED   Klebsiella oxytoca NOT DETECTED NOT DETECTED   Klebsiella pneumoniae NOT DETECTED NOT DETECTED   Proteus species NOT DETECTED NOT DETECTED   Serratia marcescens NOT DETECTED NOT DETECTED   Haemophilus influenzae NOT DETECTED NOT DETECTED   Neisseria meningitidis NOT DETECTED NOT DETECTED   Pseudomonas aeruginosa NOT DETECTED NOT DETECTED   Candida albicans NOT DETECTED NOT DETECTED   Candida glabrata NOT DETECTED NOT DETECTED   Candida krusei NOT DETECTED NOT DETECTED   Candida parapsilosis NOT DETECTED NOT DETECTED   Candida tropicalis NOT DETECTED NOT DETECTED   Name of physician (or Provider) Contacted: Dr Jerilee Hoh  Changes to prescribed antibiotics required: Rocephin 2gm IV q24hrs  Hart Robinsons A 07/03/2016  1:05 PM

## 2016-07-03 NOTE — Progress Notes (Signed)
PROGRESS NOTE    Charles [name]  BVQ:[id number] DOB: 09-Jul-1945 DOA: 07/02/2016 PCP: Wende Neighbors, MD     Brief Narrative:  71 y/o man admitted from home on 3/22 after he was called to return to the hospital due to [facility] bacteremia on [facility] dated 3/22. He had been seen in the early hours of the morning due to night sweats and other nonspecific symptoms. Speciation shows strep viridans. ECHO reviewed with Dr. 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 He recommends transfer to [facility] for evaluation by thoracic surgery. D/w Dr. Roxy Manns, will see in consult. Will need TEE.   Assessment & Plan:   Principal Problem:   Bacteremia Active Problems:   Anemia   H/O cardiac murmur   Aortic dilatation (HCC)   Elevated troponin   Protein-calorie malnutrition, severe   Strep Viridans Bacteremia -With AV endocarditis. -On vanc, will switch to rocephin. -With moderate dilatation of the aortic root, have discussed case with Dr. Roxy Manns. Will transfer to [facility] for evaluation by thoracic surgery. -Repeat BC requested. Will need PICC placement once bacteremia cleared for protracted abx course. -Would recommend ID consultation upon arrival to [facility]. -Will also need a cardiology consultation for probable TEE.  Elevated Troponin -Likely demand ischemia from bacteremia and endocarditis. -Case discussed via phone with Dr. Domenic Polite (cardiology); no need for heparin, unlikely NSTEMI. Patient without CP.   DVT prophylaxis: lovenox Code Status: full code Family Communication: patient only Disposition Plan: transfer to [facility]  Consultants:   Cardiology  Thoracic surgery (via phone)  Procedures:   None  Antimicrobials:  Anti-infectives    Start     Dose/Rate Route Frequency Ordered Stop   07/03/16 0800  vancomycin (VANCOCIN) IVPB 750 mg/150 ml premix     750 mg 150 mL/hr over 60 Minutes Intravenous Every 12 hours 07/02/16 1953     07/03/16 0000   piperacillin-tazobactam (ZOSYN) IVPB 3.375 g  Status:  Discontinued     3.375 g 12.5 mL/hr over 240 Minutes Intravenous Every 8 hours 07/02/16 1953 07/03/16 1205   07/02/16 2000  vancomycin (VANCOCIN) IVPB 1000 mg/200 mL premix     1,000 mg 200 mL/hr over 60 Minutes Intravenous  Once 07/02/16 1953 07/02/16 2232   07/02/16 1630  piperacillin-tazobactam (ZOSYN) IVPB 3.375 g     3.375 g 100 mL/hr over 30 Minutes Intravenous  Once 07/02/16 1623 07/02/16 1745   07/02/16 1630  vancomycin (VANCOCIN) IVPB 1000 mg/200 mL premix     1,000 mg 200 mL/hr over 60 Minutes Intravenous  Once 07/02/16 1623 07/02/16 1817       Subjective: No complaints, specifically no CP or SOB  Objective: Vitals:   07/02/16 1845 07/02/16 2002 07/03/16 0428 07/03/16 1416  BP:  (!) 133/55 (!) 109/52 (!) 135/57  Pulse: 78 78 87 77  Resp: 14 20 17 18   Temp:  98.4 F (36.9 C) 98.5 F (36.9 C) 98.3 F (36.8 C)  TempSrc:  Oral Oral Axillary  SpO2: 97% 97% 94% 97%  Weight:  105.1 kg (231 lb 11.2 oz)    Height:  6' (1.829 m)      Intake/Output Summary (Last 24 hours) at 07/03/16 1658 Last data filed at 07/03/16 8003  Gross per 24 hour  Intake          1439.67 ml  Output              500 ml  Net  939.67 ml   Filed Weights   07/02/16 1551 07/02/16 2002  Weight: 108.9 kg (240 lb) 105.1 kg (231 lb 11.2 oz)    Examination:  General exam: Alert, awake, oriented x 3 Respiratory system: Clear to auscultation. Respiratory effort normal. Cardiovascular system:RRR. No murmurs, rubs, gallops. Gastrointestinal system: Abdomen is nondistended, soft and nontender. No organomegaly or masses felt. Normal bowel sounds heard. Central nervous system: Alert and oriented. No focal neurological deficits. Extremities: No C/C/E, +pedal pulses Skin: No rashes, lesions or ulcers Psychiatry: Judgement and insight appear normal. Mood & affect appropriate.     Data Reviewed: I have personally reviewed following labs and  imaging studies  CBC:  Recent Labs Lab 07/01/16 2017 07/02/16 1744 07/03/16 0715  WBC 16.0*  --  12.9*  NEUTROABS 13.8*  --   --   HGB 10.2* 9.9* 8.5*  HCT 32.0* 29.0* 27.1*  MCV 80.4  --  79.2  PLT 299  --  174   Basic Metabolic Panel:  Recent Labs Lab 07/01/16 2017 07/01/16 2118 07/02/16 1744 07/03/16 0715  NA 134*  --  138 136  K 3.9  --  4.1 3.9  CL 101  --  102 106  CO2 24  --   --  24  GLUCOSE 148*  --  162* 123*  BUN 27*  --  33* 23*  CREATININE 1.37*  --  1.20 1.19  CALCIUM 8.5*  --   --  8.0*  MG  --  2.3  --   --    GFR: Estimated Creatinine Clearance: 74.5 mL/min (by C-G formula based on SCr of 1.19 mg/dL). Liver Function Tests:  Recent Labs Lab 07/01/16 2118  AST 21  ALT 17  ALKPHOS 126  BILITOT 0.7  PROT 7.6  ALBUMIN 3.0*   No results for input(s): LIPASE, AMYLASE in the last 168 hours. No results for input(s): AMMONIA in the last 168 hours. Coagulation Profile: No results for input(s): INR, PROTIME in the last 168 hours. Cardiac Enzymes:  Recent Labs Lab 07/02/16 1954 07/03/16 0117 07/03/16 0715  TROPONINI 0.89* 1.01* 0.78*   BNP (last 3 results) No results for input(s): PROBNP in the last 8760 hours. HbA1C: No results for input(s): HGBA1C in the last 72 hours. CBG: No results for input(s): GLUCAP in the last 168 hours. Lipid Profile:  Recent Labs  07/03/16 0715  CHOL 122  HDL 16*  LDLCALC 81  TRIG 125  CHOLHDL 7.6   Thyroid Function Tests:  Recent Labs  07/01/16 2118  TSH 1.300   Anemia Panel:  Recent Labs  07/02/16 1954  FERRITIN 443*  TIBC 238*  IRON 37*   Urine analysis:    Component Value Date/Time   COLORURINE AMBER (A) 07/01/2016 2304   APPEARANCEUR HAZY (A) 07/01/2016 2304   LABSPEC 1.026 07/01/2016 2304   PHURINE 5.0 07/01/2016 2304   GLUCOSEU NEGATIVE 07/01/2016 2304   HGBUR MODERATE (A) 07/01/2016 2304   BILIRUBINUR NEGATIVE 07/01/2016 2304   KETONESUR 5 (A) 07/01/2016 2304   PROTEINUR  100 (A) 07/01/2016 2304   NITRITE NEGATIVE 07/01/2016 2304   LEUKOCYTESUR NEGATIVE 07/01/2016 2304   Sepsis Labs: @LABRCNTIP (procalcitonin:4,lacticidven:4)  ) Recent Results (from the past 240 hour(s))  Blood culture (routine x 2)     Status: Abnormal (Preliminary result)   Collection Time: 07/02/16 12:08 AM  Result Value Ref Range Status   Specimen Description BLOOD RIGHT HAND  Final   Special Requests BOTTLES DRAWN AEROBIC AND ANAEROBIC [place]  Final  Culture  Setup Time   Final    GRAM POSITIVE COCCI Gram Stain Report Called to,Read Back By and Verified With: MINTER,R ON 07/02/16 @1515  BY MITCHELL,S IN BOTH AEROBIC AND ANAEROBIC BOTTLES Performed at [facility], [place] [address] [place], [place] [address]    Culture VIRIDANS STREPTOCOCCUS (A)  Final   Report Status PENDING  Incomplete  Blood culture (routine x 2)     Status: Abnormal (Preliminary result)   Collection Time: 07/02/16 12:17 AM  Result Value Ref Range Status   Specimen Description BLOOD RIGHT ARM  Final   Special Requests BOTTLES DRAWN AEROBIC AND ANAEROBIC [place]  Final   Culture  Setup Time   Final    GRAM POSITIVE COCCI Gram Stain Report Called to,Read Back By and Verified With: BECCA MINTER RN @ 8416 ON 3.22.18 BY BAYSE,L IN BOTH AEROBIC AND ANAEROBIC BOTTLES CRITICAL RESULT CALLED TO, READ BACK BY AND VERIFIED WITH: T BULLOCK,RN @0041  07/03/16 MKELLY,MLT Performed at [facility], [place] [address], [place], [place] [address]    Culture VIRIDANS STREPTOCOCCUS (A)  Final   Report Status PENDING  Incomplete  Blood Culture ID Panel (Reflexed)     Status: Abnormal   Collection Time: 07/02/16 12:17 AM  Result Value Ref Range Status   Enterococcus species NOT DETECTED NOT DETECTED Final   Listeria monocytogenes NOT DETECTED NOT DETECTED Final   Staphylococcus species NOT DETECTED NOT DETECTED Final   Staphylococcus aureus NOT DETECTED NOT DETECTED Final   Streptococcus species DETECTED (A) NOT DETECTED  Final    Comment: Not Enterococcus species, Streptococcus agalactiae, Streptococcus pyogenes, or Streptococcus pneumoniae. CRITICAL RESULT CALLED TO, READ BACK BY AND VERIFIED WITH: T BULLOCK,RN @0041  07/03/16 MKELLY,MLT    Streptococcus agalactiae NOT DETECTED NOT DETECTED Final   Streptococcus pneumoniae NOT DETECTED NOT DETECTED Final   Streptococcus pyogenes NOT DETECTED NOT DETECTED Final   Acinetobacter baumannii NOT DETECTED NOT DETECTED Final   Enterobacteriaceae species NOT DETECTED NOT DETECTED Final   Enterobacter cloacae complex NOT DETECTED NOT DETECTED Final   Escherichia coli NOT DETECTED NOT DETECTED Final   Klebsiella oxytoca NOT DETECTED NOT DETECTED Final   Klebsiella pneumoniae NOT DETECTED NOT DETECTED Final   Proteus species NOT DETECTED NOT DETECTED Final   Serratia marcescens NOT DETECTED NOT DETECTED Final   Haemophilus influenzae NOT DETECTED NOT DETECTED Final   Neisseria meningitidis NOT DETECTED NOT DETECTED Final   Pseudomonas aeruginosa NOT DETECTED NOT DETECTED Final   Candida albicans NOT DETECTED NOT DETECTED Final   Candida glabrata NOT DETECTED NOT DETECTED Final   Candida krusei NOT DETECTED NOT DETECTED Final   Candida parapsilosis NOT DETECTED NOT DETECTED Final   Candida tropicalis NOT DETECTED NOT DETECTED Final    Comment: Performed at [facility], [place] [address], [place], [place] [address]  Culture, blood (Routine X 2) w Reflex to ID Panel     Status: None (Preliminary result)   Collection Time: 07/02/16  4:31 PM  Result Value Ref Range Status   Specimen Description RIGHT ANTECUBITAL  Final   Special Requests BOTTLES DRAWN AEROBIC AND ANAEROBIC 10 CC EACH  Final   Culture  Setup Time   Final    GRAM POSITIVE COCCI Gram Stain Report Called to,Read Back By and Verified With: JOHNSON B. AT [place] [id number] BY THOMPSON S. IN BOTH AEROBIC AND ANAEROBIC BOTTLES CRITICAL VALUE NOTED.  VALUE IS CONSISTENT WITH PREVIOUSLY REPORTED AND CALLED  VALUE. Performed at [facility], [address] [place], [place] [address]    Culture PENDING  Incomplete   Report Status PENDING  Incomplete  Culture, blood (Routine X 2) w Reflex to ID Panel     Status: None (Preliminary result)   Collection Time: 07/02/16  4:34 PM  Result Value Ref Range Status   Specimen Description BLOOD RIGHT HAND  Final   Special Requests BOTTLES DRAWN AEROBIC AND ANAEROBIC 10CC EACH  Final   Culture  Setup Time   Final    GRAM POSITIVE COCCI Gram Stain Report Called to,Read Back By and Verified With: JOHNSON B. AT [place] [id number] BY THOMPSON S. IN BOTH AEROBIC AND ANAEROBIC BOTTLES CRITICAL VALUE NOTED.  VALUE IS CONSISTENT WITH PREVIOUSLY REPORTED AND CALLED VALUE. Performed at [facility], [place] [address], [place], [place] [address]    Culture PENDING  Incomplete   Report Status PENDING  Incomplete         Radiology Studies: Dg Chest 2 View  Result Date: 07/01/2016 CLINICAL DATA:  71 year old male with night sweats and weight loss. EXAM: CHEST  2 VIEW COMPARISON:  Chest radiograph dated 05/01/2016 FINDINGS: The lungs are clear. There is no pleural effusion or pneumothorax. The cardiac silhouette is within normal limits. The aorta is tortuous. There is prominence of the aortic root on the lateral projection which may be the artifactual and related to tortuosity. Aneurysmal dilatation of the ascending aorta or aortic root is not excluded. CT of the chest with contrast may provide better evaluation. No acute osseous pathology identified. IMPRESSION: 1. No acute cardiopulmonary process. 2. Tortuous aorta with dilated appearance of the ascending aorta/aortic root. CT with contrast may provide better evaluation. Electronically Signed   By: Anner Crete M.D.   On: 07/01/2016 23:21   Ct Head Wo Contrast  Result Date: 07/01/2016 CLINICAL DATA:  Fall in December not feeling well EXAM: CT HEAD WITHOUT CONTRAST TECHNIQUE: Contiguous axial images were  obtained from the base of the skull through the vertex without intravenous contrast. COMPARISON:  None. FINDINGS: Brain: No acute territorial infarction, intracranial hemorrhage or focal mass lesion is visualized. The ventricles are nonenlarged. Mild atrophy. Vascular: No hyperdense vessels. Scattered calcifications at the carotid siphons. Skull: Normal. Negative for fracture or focal lesion. Sinuses/Orbits: Minimal mucosal thickening in the ethmoid sinuses. Prior maxillary antrostomies. Tiny mucous retention cysts or small polyps in the maxillary sinuses. Other: None IMPRESSION: No CT evidence for acute intracranial abnormality. Electronically Signed   By: Donavan Foil M.D.   On: 07/01/2016 23:03   Ct Chest W Contrast  Result Date: 07/02/2016 CLINICAL DATA:  71 year old male with weight loss, night sweats, and abnormal chest x-ray. EXAM: CT CHEST WITH CONTRAST TECHNIQUE: Multidetector CT imaging of the chest was performed during intravenous contrast administration. CONTRAST:  25mL ISOVUE-300 IOPAMIDOL (ISOVUE-300) INJECTION 61% COMPARISON:  Chest radiograph dated 07/01/2016 FINDINGS: Cardiovascular: Top-normal cardiac size. No pericardial effusion. The thoracic aorta is tortuous. There is mild dilatation of the ascending aorta measuring up to 4.4 cm in diameter. No aortic dissection. The origins of the great vessels of the aortic arch appear patent. The central pulmonary arteries are grossly unremarkable. Evaluation of the pulmonary arteries is limited due to suboptimal opacification and timing of the contrast. Mediastinum/Nodes: There is no hilar or mediastinal adenopathy. The esophagus and the thyroid gland are grossly unremarkable. Lungs/Pleura: There is mild elevation of the left hemidiaphragm with linear left lung base atelectasis/ scarring. Pneumonia is less likely. There is no pleural effusion or  pneumothorax. The central airways are patent. Upper Abdomen: There is apparent fatty infiltration of the  liver. A 2.1 cm hypodense lesion in the caudate lobe is not well characterized but may represent a cyst or hemangioma. There is a 2.5 cm indeterminate right adrenal hypodense nodule, most likely an adenoma. The visualized upper abdomen is otherwise unremarkable. Musculoskeletal: Mild degenerative changes of spine. No acute osseous pathology. IMPRESSION: 1. No acute intrathoracic pathology. 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 Recommend annual imaging followup by CTA or MRA. This recommendation follows 2010 ACCF/AHA/AATS/ACR/ASA/SCA/SCAI/SIR/STS/SVM Guidelines for the Diagnosis and Management of Patients with Thoracic Aortic Disease. Circulation. 2010; 121: D622-W979 2. Fatty liver. A small hypodense lesion in the caudate lobe is indeterminate but likely represents a cyst or hemangioma. 3. Probable right adrenal adenoma. Electronically Signed   By: Anner Crete M.D.   On: 07/02/2016 01:14        Scheduled Meds: . atorvastatin  40 mg Oral q1800  . enoxaparin (LOVENOX) injection  40 mg Subcutaneous Q24H  . feeding supplement  1 Container Oral TID BM  . feeding supplement (ENSURE ENLIVE)  237 mL Oral BID BM  . feeding supplement (PRO-STAT SUGAR FREE 64)  30 mL Oral BID  . sodium chloride flush  3 mL Intravenous Q12H  . vancomycin  750 mg Intravenous Q12H   Continuous Infusions: . sodium chloride 100 mL/hr at 07/02/16 2132     LOS: 1 day    Time spent: 25 minutes. Greater than 50% of this time was spent in direct contact with the patient coordinating care.     Lelon Frohlich, MD [facility] Pager [phone]  If 7PM-7AM, please contact night-coverage [email or web address] Password [facility] 07/03/2016, 4:58 PM

## 2016-07-03 NOTE — Progress Notes (Signed)
Received order to transfer patient to [place]. Report called and given to Chrissy at [facility]. All questions were answered and no further questions at this time. Pt in stable condition and in no acute distress at time of discharge. Pt will be escorted via [facility].

## 2016-07-03 NOTE — Progress Notes (Signed)
Initial Nutrition Assessment  DOCUMENTATION CODES:   Severe malnutrition in context of acute illness/injury, Obesity unspecified  INTERVENTION:  Ensure Enlive po BID, each supplement provides 350 kcal and 20 grams of protein   Boost Breeze po TID, each supplement provides 250 kcal and 9 grams of protein   ProStat 30 ml TID (each 30 ml provides 100 kcal, 15 gr protein)   Food preferences obtained and conveyed to diet office staff   NUTRITION DIAGNOSIS:   Malnutrition related to acute illness as evidenced by energy intake < or equal to 50% for > or equal to 5 days, percent weight loss (11% < 3 months).   GOAL:   Patient will meet greater than or equal to 90% of their needs   MONITOR:   PO intake, Supplement acceptance, Labs, Weight trends  REASON FOR ASSESSMENT:   Malnutrition Screening Tool    ASSESSMENT: Charles [name] is a 71 yo who has never been admitted into hospital. He presented to the ED back in late December related to Bronchitis he was discharged with antibiotic, steriod and robitussin. According to him he has enjoyed mostly good health up until about 5 weeks ago. Since that time his appetite "just went away". He lives with his wife and consumes regular diet. He denies chewing swallowing problems and says he has been living on fresh and canned fruits, water and fruit juices. Questioned him about protein sources. He had a hamburger from [facility] on day and says he had to "make himself" eat it.  He doesn't usually drink milk but is willing to for increased nutrition and he likes Ensure but especially eager to try the Boost Breeze (juice type) product. He has been taking a multivitamin and Iron consistently. The patient is aware of his poor intake over the past 5 weeks and expresses understanding of the importance of improving his nutritional status. With his poor po's his weight is down 11% since December. His usual body weight range 116-118 kg).  Recent Labs Lab  07/01/16 2017 07/01/16 2118 07/02/16 1744 07/03/16 0715  NA 134*  --  138 136  K 3.9  --  4.1 3.9  CL 101  --  102 106  CO2 24  --   --  24  BUN 27*  --  33* 23*  CREATININE 1.37*  --  1.20 1.19  CALCIUM 8.5*  --   --  8.0*  MG  --  2.3  --   --   GLUCOSE 148*  --  162* 123*   Labs: reviewed  Meds: Zosyn, Vancomycin  Nutrition-Focused physical exam completed. Findings are mild fat depletion (triceps), mild (temporal, clavicle and patellar) muscle depletion, and no edema.  Patient complains of increased weakness.    Diet Order:  Diet regular Room service appropriate? Yes; Fluid consistency: Thin  Skin:  Reviewed, no issues  Last BM:  07/02/16  Height:   Ht Readings from Last 1 Encounters:  07/02/16 6' (1.829 m)    Weight:   Wt Readings from Last 1 Encounters:  07/02/16 231 lb 11.2 oz (105.1 kg)    Ideal Body Weight:  81 kg  BMI:  Body mass index is 31.42 kg/m.  Estimated Nutritional Needs:   Kcal:  1980-2160  Protein:  121-130 gr  Fluid:  2.0-2.2 liters daily  EDUCATION NEEDS:   Education needs addressed regarding calorie and protein needs  Charles Cater MS,RD,CSG,LDN Office: [phone] Pager: [phone]

## 2016-07-03 NOTE — Care Management Important Message (Signed)
Important Message  Patient Details  Name: Charles [name] MRN: [id number] Date of Birth: 01/19/1946   Medicare Important Message Given:  Yes    Duvall Comes, Chauncey Reading, RN 07/03/2016, 3:19 PM

## 2016-07-04 ENCOUNTER — Encounter (HOSPITAL_COMMUNITY): Payer: Self-pay | Admitting: Thoracic Surgery (Cardiothoracic Vascular Surgery)

## 2016-07-04 DIAGNOSIS — Z972 Presence of dental prosthetic device (complete) (partial): Secondary | ICD-10-CM

## 2016-07-04 DIAGNOSIS — B351 Tinea unguium: Secondary | ICD-10-CM

## 2016-07-04 DIAGNOSIS — I330 Acute and subacute infective endocarditis: Secondary | ICD-10-CM

## 2016-07-04 DIAGNOSIS — D72829 Elevated white blood cell count, unspecified: Secondary | ICD-10-CM

## 2016-07-04 DIAGNOSIS — B954 Other streptococcus as the cause of diseases classified elsewhere: Secondary | ICD-10-CM

## 2016-07-04 DIAGNOSIS — D649 Anemia, unspecified: Secondary | ICD-10-CM

## 2016-07-04 DIAGNOSIS — M545 Low back pain: Secondary | ICD-10-CM

## 2016-07-04 DIAGNOSIS — E43 Unspecified severe protein-calorie malnutrition: Secondary | ICD-10-CM

## 2016-07-04 LAB — RETICULOCYTES
RBC.: 3.79 MIL/uL — ABNORMAL LOW (ref 4.22–5.81)
RETIC COUNT ABSOLUTE: 56.9 10*3/uL (ref 19.0–186.0)
Retic Ct Pct: 1.5 % (ref 0.4–3.1)

## 2016-07-04 LAB — CULTURE, BLOOD (ROUTINE X 2)

## 2016-07-04 LAB — IRON AND TIBC
IRON: 27 ug/dL — AB (ref 45–182)
Saturation Ratios: 12 % — ABNORMAL LOW (ref 17.9–39.5)
TIBC: 234 ug/dL — ABNORMAL LOW (ref 250–450)
UIBC: 207 ug/dL

## 2016-07-04 LAB — FOLATE: FOLATE: 27.6 ng/mL (ref >5.9)

## 2016-07-04 LAB — FERRITIN: Ferritin: 411 ng/mL — ABNORMAL HIGH (ref 24–336)

## 2016-07-04 LAB — VITAMIN B12: Vitamin B-12: 636 pg/mL (ref 180–914)

## 2016-07-04 NOTE — Consult Note (Addendum)
CARDIOLOGY CONSULT NOTE  Patient ID: Charles [name] MRN: [id number] DOB/AGE: 10-07-1945 71 y.o.  Admit date: 07/02/2016 Primary Physician Delphina Cahill, MD Primary Cardiologist New Chief Complaint  Night sweats, weight loss Requesting  Dr. Sloan Leiter  HPI:   The patient presented to the ED with night sweats and weight loss.  He was at [facility].  Cultures were drawn and he was sent home but was called back when his cultures became positive.  He was found to have strep Viridans bacteremia.  He has a history of a heart murmur.    However, he was living in [place].  He saw a cardiologist and reports that he had an echo but was not told that he had any valve problems as far as he knows.  He also reports a negative POET (Plain Old Exercise Treadmill) about 3 years ago.  He has had no symptoms recently.  He is able to work around the house and shoveled gravel prior to getting sick.    About two months ago he started feeling weak and having diaphoresis and weight loss.  He had a couple of episodes of chills.  He might have had some fevers but he did not take his temp.  He had about 3 vague episodes of shoulder discomfort.  Otherwise he has no chest, neck or jaw pain.  He has had no prior cardiac diagnosis other than the murmur.    He has had an echo with results as below.  There appears to be a small lesion on a clearly bicuspid aortic valve.  He has an eccentric jet of AI.     Past Medical History:  Diagnosis Date  . Anemia   . Aortic dilatation ([place]) 07/02/2016   Ct chest W contrast- 07/01/16-  1. No acute intrathoracic pathology. 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 Recommend annual imaging followup by CTA or MRA. This recommendation follows 2010 ACCF/AHA/AATS/ACR/ASA/SCA/SCAI/SIR/STS/SVM Guidelines for the Diagnosis and Management of Patients with Thoracic Aortic Disease. Circulation. 2010; 121: J825-K539 2. Fatty liver. A sm  . Aortic insufficiency 07/02/2016  . Bicuspid aortic  valve 07/02/2016    Past Surgical History:  Procedure Laterality Date  . HERNIA REPAIR      No Known Allergies Prescriptions Prior to Admission  Medication Sig Dispense Refill Last Dose  . ENSURE PLUS (ENSURE PLUS) LIQD Take 237 mLs by mouth daily.   07/02/2016 at Unknown time  . ferrous sulfate 325 (65 FE) MG tablet Take 325 mg by mouth daily with breakfast.   07/02/2016 at Unknown time  . Multiple Vitamin (MULTIVITAMIN WITH MINERALS) TABS tablet Take 1 tablet by mouth daily.   07/02/2016 at Unknown time   Family history Father died of Coronary Thrombosis at age 12,  Mother died of Ovarian cancer.     Social History   Social History  . Marital status: Married    Spouse name: N/A  . Number of children: N/A  . Years of education: N/A   Occupational History  . Not on file.   Social History Main Topics  . Smoking status: Former Research scientist (life sciences)  . Smokeless tobacco: Never Used  . Alcohol use No  . Drug use: No  . Sexual activity: Not on file   Other Topics Concern  . Not on file   Social History Narrative  . No narrative on file     ROS:    As stated in the HPI and negative for all other systems.  Physical Exam: Blood  pressure (!) 127/49, pulse 75, temperature 98.2 F (36.8 C), temperature source Oral, resp. rate 16, height 6' (1.829 m), weight 235 lb 10.8 oz (106.9 kg), SpO2 96 %.  GENERAL:  Well appearing HEENT:  Pupils equal round and reactive, fundi not visualized, oral mucosa unremarkable NECK:  No jugular venous distention, waveform within normal limits, carotid upstroke brisk and symmetric, no bruits, no thyromegaly LYMPHATICS:  No cervical, inguinal adenopathy LUNGS:  Clear to auscultation bilaterally BACK:  No CVA tenderness CHEST:  Unremarkable HEART:  PMI not displaced or sustained,S1 and S2 within normal limits, no S3, no S4, no clicks, no rubs, 2/6 brief apical systolic murmur.  2/6 diastolic murmur at the  murmurs ABD:  Flat, positive bowel sounds normal in  frequency in pitch, no bruits, no rebound, no guarding, no midline pulsatile mass, no hepatomegaly, no splenomegaly EXT:  2 plus pulses throughout, mild bilateral ankle edema, no cyanosis no clubbing SKIN:  No rashes no nodules NEURO:  Cranial nerves II through XII grossly intact, motor grossly intact throughout PSYCH:  Cognitively intact, oriented to person place and time   Labs: Lab Results  Component Value Date   BUN 23 (H) 07/03/2016   Lab Results  Component Value Date   CREATININE 1.19 07/03/2016   Lab Results  Component Value Date   NA 136 07/03/2016   K 3.9 07/03/2016   CL 106 07/03/2016   CO2 24 07/03/2016   Lab Results  Component Value Date   TROPONINI 0.78 (HH) 07/03/2016   Lab Results  Component Value Date   WBC 12.9 (H) 07/03/2016   HGB 8.5 (L) 07/03/2016   HCT 27.1 (L) 07/03/2016   MCV 79.2 07/03/2016   PLT 237 07/03/2016   Lab Results  Component Value Date   CHOL 122 07/03/2016   HDL 16 (L) 07/03/2016   LDLCALC 81 07/03/2016   TRIG 125 07/03/2016   CHOLHDL 7.6 07/03/2016   Lab Results  Component Value Date   ALT 17 07/01/2016   AST 21 07/01/2016   ALKPHOS 126 07/01/2016   BILITOT 0.7 07/01/2016   Echo (07/03/16) :  - Left ventricle: The cavity size was mildly dilated. Wall   thickness was increased in a pattern of mild LVH. Systolic   function was normal. The estimated ejection fraction was 55%.   Probable hypokinesis of the apicalinferior myocardium. Doppler   parameters are consistent with abnormal left ventricular   relaxation (grade 1 diastolic dysfunction). Doppler parameters   are consistent with high ventricular filling pressure. - Aortic valve: Bicuspid; moderately thickened leaflets.   Intermittently noted small echodensities seen near leaflet tips   in the parasternal views, although not well in other views.   Suspicious for vegetations particularly in light of bacteremia   although TEE is indicated for further assessment, and also  to   exclude any associated abscess in light of fairly peripheral   aortic regurgitation. There was moderate regurgitation. Mean   gradient (S): 5 mm Hg. Peak gradient (S): 8 mm Hg. Regurgitation   pressure half-time: 383 ms. - Aorta: Aortic root dimension: 50 mm (ED). - Aortic root: The aortic root was moderately dilated. - Mitral valve: Calcified annulus. There was mild regurgitation. - Right atrium: Echodensity noted near the septum and tricuspid   valve plane, best seen in the apical views but not in the   subcostal views. Suspect artifact or perhaps visualization of   compressed free wall, although in light of bacteremia would   suggest further imaging  with TEE to exclude other pathology.   Central venous pressure (est): 8 mm Hg. - Tricuspid valve: There was trivial regurgitation. - Pulmonary arteries: PA peak pressure: 28 mm Hg (S). - Pericardium, extracardiac: There was no pericardial effusion.    Radiology:   CT: 1. No acute intrathoracic pathology. 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 Recommend annual imaging followup by CTA or MRA. This recommendation follows 2010 ACCF/AHA/AATS/ACR/ASA/SCA/SCAI/SIR/STS/SVM Guidelines for the Diagnosis and Management of Patients with Thoracic Aortic Disease. Circulation. 2010; 121: [id number] 2. Fatty liver. A small hypodense lesion in the caudate lobe is indeterminate but likely represents a cyst or hemangioma. 3. Probable right adrenal adenoma.   EKG:   NSR, rate 89, LAD, LVH, no acute ST T wave changes.    ASSESSMENT AND PLAN:   ENDOCARDITIS:  Meets major/minor criteria for SBE.   Eccentric AI.  Plan TEE and will likely schedule for Monday.  ID and CVS to see.  Continue current antibiotics.  Thus far he does not have indicators for early surgical intervention.    BICUSPID AORTIC VALVE:  Will assess as below.    ELEVATED TROPONIN:   No acute ST changes on EKG.  Troponin trend is flat.  Will need cardiac  cath prior to any surgical treatment of his bicuspid valve.  This will follow work up and treatment as above.  No urgent indication for cath.    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  Related to bicuspid valve.    SignedMinus Breeding 07/04/2016, 12:48 PM

## 2016-07-04 NOTE — Consult Note (Signed)
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 male  With history of bicuspid aortic valve and dilated ascending aorta, anemia, closed head injury in dec 2017, and influenza in dec 2017 who was admitted for progressive subjective fevers, nightsweats for roungly 2-3 months, and 15 lb weight loss. He attributes some of these symptoms occurring after being treated for influenza in December. He also reported having dental work (partials placement, no drilling or root canal) in the past few months.When he presented to the Ed on 3/22, he was found to have numerous blood cx positive for viridans streptococcal bacteremia. His TTE suggests that he may have evidence of endocarditis to his bicuspid AV. He was initially placed on vancomycin and piptazo but narrowed in ceftriaxone. His initial labs showed WBC of 16K now down to 12.9K. He is hemodynamically stable, though has had +troponins, peaked at 1.01, being seen by cardiology to see if demand ischemia in setting of infection vs NSTEMI/ACS. He had repeat chest Ct to evaluate ascending aorta, no pulmonary signs to suggest septic emboli per my read.  Past Medical History:  Diagnosis Date  . Anemia   . Aortic dilatation ([place]) 07/02/2016   Ct chest W contrast- 07/01/16-  1. No acute intrathoracic pathology. 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 Recommend annual imaging followup by CTA or MRA. This recommendation follows 2010 ACCF/AHA/AATS/ACR/ASA/SCA/SCAI/SIR/STS/SVM Guidelines for  the Diagnosis and Management of Patients with Thoracic Aortic Disease. Circulation. 2010; 121: [id number] 2. Fatty liver. A sm  . Aortic insufficiency 07/02/2016  . Bicuspid aortic valve 07/02/2016    Allergies: No Known Allergies   MEDICATIONS: . atorvastatin  40 mg Oral q1800  . cefTRIAXone (ROCEPHIN)  IV  2 g Intravenous Q24H  . enoxaparin (LOVENOX) injection  40 mg Subcutaneous Q24H  . feeding supplement  1 Container Oral TID BM  . feeding supplement (ENSURE ENLIVE)  237 mL Oral BID BM  . feeding supplement (PRO-STAT SUGAR FREE 64)  30 mL Oral BID  . sodium chloride flush  3 mL Intravenous Q12H    Social History  Substance Use Topics  . Smoking status: Former Research scientist (life sciences)  . Smokeless tobacco: Never Used  . Alcohol use No    Family HX: father had MI and died of age of 15. Mother died of ovarian ca  Review of Systems -   Constitutional: positive for fever, chills, diaphoresis, decreased activity change, decreased appetite change, fatigue and unexpected weight change of 17 lb.  HENT: Negative for congestion, sore throat, rhinorrhea, sneezing, trouble swallowing and sinus pressure.  Eyes: Negative for photophobia and visual disturbance.  Respiratory: Negative for cough, chest tightness, shortness of breath, wheezing and stridor.  Cardiovascular: Negative for chest pain, palpitations and leg swelling.  Gastrointestinal: Negative for nausea, vomiting, abdominal pain, diarrhea, constipation, blood in stool, abdominal distention and anal bleeding.  Genitourinary: Negative for dysuria, hematuria, flank pain and difficulty urinating.  Musculoskeletal:slight  left lower back pain. Negative for myalgias, back pain, joint swelling, arthralgias and gait problem.  Skin: Negative for color change, pallor, rash and wound.  Neurological: Negative for dizziness, tremors, weakness and light-headedness.  Hematological: Negative for adenopathy. Does not bruise/bleed easily.  Psychiatric/Behavioral:  Negative for behavioral problems, confusion, sleep disturbance, dysphoric mood, decreased concentration and agitation.     OBJECTIVE: Temp:  [97.6 F (36.4 C)-98.7 F (37.1 C)] 98.2 F (36.8 C) (03/24 0835) Pulse Rate:  [73-79] 75 (03/24 0835) Resp:  [16-18] 16 (03/24 0200) BP: (127-137)/(49-69) 127/49 (03/24 0835) SpO2:  [96 %-98 %] 96 % (03/24 0835) Weight:  [235 lb 10.8 oz (106.9 kg)] 235 lb 10.8 oz (106.9 kg) (03/24 0200) Physical Exam  Constitutional: He is oriented to person, place, and time. He appears well-developed and well-nourished. No distress.  HENT:  Mouth/Throat: Oropharynx is clear and moist. No oropharyngeal exudate.  Cardiovascular: Normal rate, regular rhythm and normal heart sounds. Exam reveals no gallop and no friction rub.  No murmur heard.  Pulmonary/Chest: Effort normal and breath sounds normal. No respiratory distress. He has no wheezes. Mild inspiratory crackles on the left base Abdominal: Soft. Bowel sounds are normal. He exhibits no distension. There is no tenderness.  Lymphadenopathy:  He has no cervical adenopathy.  Neurological: He is alert and oriented to person, place, and time.  Skin: Skin is warm and dry. No rash noted. No erythema. No splinter hemorrhages on nails. Onychomycosis of feet Ext: trace edema LE Bilaterally Psychiatric: He has a normal mood and affect. His behavior is normal.     LABS: Results for orders placed or performed during the hospital encounter of 07/02/16 (from the past 48 hour(s))  Culture, blood (Routine X 2) w Reflex to ID Panel     Status: None (Preliminary result)   Collection Time: 07/02/16  4:31 PM  Result Value Ref Range   Specimen Description RIGHT ANTECUBITAL    Special Requests BOTTLES DRAWN AEROBIC AND ANAEROBIC 10 CC EACH    Culture  Setup Time      GRAM POSITIVE COCCI Gram Stain Report Called to,Read Back By and Verified With: JOHNSON B. AT [email or web address] ON [id number] BY THOMPSON S. IN BOTH AEROBIC AND ANAEROBIC  BOTTLES CRITICAL VALUE NOTED.  VALUE IS CONSISTENT WITH PREVIOUSLY REPORTED AND CALLED VALUE. Performed at [facility], [place] [address] [place], [place] [address]    Culture GRAM POSITIVE COCCI    Report Status PENDING   Culture, blood (Routine X 2) w Reflex to ID Panel     Status: None (Preliminary result)   Collection Time: 07/02/16  4:34 PM  Result Value Ref Range   Specimen Description BLOOD RIGHT HAND    Special Requests BOTTLES DRAWN AEROBIC AND ANAEROBIC 10CC EACH    Culture  Setup Time      GRAM POSITIVE COCCI Gram Stain Report Called to,Read Back By and Verified With: JOHNSON B. AT [email or web address] ON [id number] BY THOMPSON S. IN BOTH AEROBIC AND ANAEROBIC BOTTLES CRITICAL VALUE NOTED.  VALUE IS CONSISTENT WITH PREVIOUSLY REPORTED AND CALLED VALUE. Performed at [facility], [place] [address], [place], [place] [address]    Culture GRAM POSITIVE COCCI    Report Status PENDING   I-Stat CG4 Lactic Acid, ED     Status: None   Collection Time: 07/02/16  5:44 PM  Result Value Ref Range   Lactic Acid, Venous 1.21 0.5 - 1.9 mmol/L  I-stat Chem 8, ED     Status: Abnormal  Collection Time: 07/02/16  5:44 PM  Result Value Ref Range   Sodium 138 135 - 145 mmol/L   Potassium 4.1 3.5 - 5.1 mmol/L   Chloride 102 101 - 111 mmol/L   BUN 33 (H) 6 - 20 mg/dL   Creatinine, Ser 1.20 0.61 - 1.24 mg/dL   Glucose, Bld 162 (H) 65 - 99 mg/dL   Calcium, Ion 1.16 1.15 - 1.40 mmol/L   TCO2 26 0 - 100 mmol/L   Hemoglobin 9.9 (L) 13.0 - 17.0 g/dL   HCT 29.0 (L) 39.0 - 52.0 %  Urine rapid drug screen (hosp performed)     Status: None   Collection Time: 07/02/16  7:20 PM  Result Value Ref Range   Opiates NONE DETECTED NONE DETECTED   Cocaine NONE DETECTED NONE DETECTED   Benzodiazepines NONE DETECTED NONE DETECTED   Amphetamines NONE DETECTED NONE DETECTED   Tetrahydrocannabinol NONE DETECTED NONE DETECTED   Barbiturates NONE DETECTED NONE DETECTED    Comment:        DRUG SCREEN FOR MEDICAL  PURPOSES ONLY.  IF CONFIRMATION IS NEEDED FOR ANY PURPOSE, NOTIFY LAB WITHIN 5 DAYS.        LOWEST DETECTABLE LIMITS FOR URINE DRUG SCREEN Drug Class       Cutoff (ng/mL) Amphetamine      1000 Barbiturate      200 Benzodiazepine   384 Tricyclics       665 Opiates          300 Cocaine          300 THC              50   Troponin I     Status: Abnormal   Collection Time: 07/02/16  7:54 PM  Result Value Ref Range   Troponin I 0.89 (HH) <0.03 ng/mL    Comment: CRITICAL RESULT CALLED TO, READ BACK BY AND VERIFIED WITH: BULICK,T AT 9935 ON 7.01.77 BY ISLEY,B   Ferritin     Status: Abnormal   Collection Time: 07/02/16  7:54 PM  Result Value Ref Range   Ferritin 443 (H) 24 - 336 ng/mL    Comment: Performed at [facility], [place] [address], [place], [place] [address]  Iron and TIBC     Status: Abnormal   Collection Time: 07/02/16  7:54 PM  Result Value Ref Range   Iron 37 (L) 45 - 182 ug/dL   TIBC 238 (L) 250 - 450 ug/dL   Saturation Ratios 16 (L) 17.9 - 39.5 %   UIBC 201 ug/dL    Comment: Performed at [facility] [address], [place], [place] [address]  Troponin I     Status: Abnormal   Collection Time: 07/03/16  1:17 AM  Result Value Ref Range   Troponin I 1.01 (HH) <0.03 ng/mL    Comment: CRITICAL VALUE NOTED.  VALUE IS CONSISTENT WITH PREVIOUSLY REPORTED AND CALLED VALUE.  Troponin I     Status: Abnormal   Collection Time: 07/03/16  7:15 AM  Result Value Ref Range   Troponin I 0.78 (HH) <0.03 ng/mL    Comment: CRITICAL VALUE NOTED.  VALUE IS CONSISTENT WITH PREVIOUSLY REPORTED AND CALLED VALUE.  Basic metabolic panel     Status: Abnormal   Collection Time: 07/03/16  7:15 AM  Result Value Ref Range   Sodium 136 135 - 145 mmol/L   Potassium 3.9 3.5 - 5.1 mmol/L   Chloride 106 101 - 111 mmol/L  CO2 24 22 - 32 mmol/L   Glucose, Bld 123 (H) 65 - 99 mg/dL   BUN 23 (H) 6 - 20 mg/dL   Creatinine, Ser 1.19 0.61 - 1.24 mg/dL   Calcium 8.0 (L) 8.9 - 10.3 mg/dL     GFR calc non Af Amer >60 >60 mL/min   GFR calc Af Amer >60 >60 mL/min    Comment: (NOTE) The eGFR has been calculated using the CKD EPI equation. This calculation has not been validated in all clinical situations. eGFR's persistently <60 mL/min signify possible Chronic Kidney Disease.    Anion gap 6 5 - 15  CBC     Status: Abnormal   Collection Time: 07/03/16  7:15 AM  Result Value Ref Range   WBC 12.9 (H) 4.0 - 10.5 K/uL   RBC 3.42 (L) 4.22 - 5.81 MIL/uL   Hemoglobin 8.5 (L) 13.0 - 17.0 g/dL   HCT 27.1 (L) 39.0 - 52.0 %   MCV 79.2 78.0 - 100.0 fL   MCH 24.9 (L) 26.0 - 34.0 pg   MCHC 31.4 30.0 - 36.0 g/dL   RDW 16.8 (H) 11.5 - 15.5 %   Platelets 237 150 - 400 K/uL  Lipid panel     Status: Abnormal   Collection Time: 07/03/16  7:15 AM  Result Value Ref Range   Cholesterol 122 0 - 200 mg/dL   Triglycerides 125 <150 mg/dL   HDL 16 (L) >40 mg/dL   Total CHOL/HDL Ratio 7.6 RATIO   VLDL 25 0 - 40 mg/dL   LDL Cholesterol 81 0 - 99 mg/dL    Comment:        Total Cholesterol/HDL:CHD Risk Coronary Heart Disease Risk Table                     Men   Women  1/2 Average Risk   3.4   3.3  Average Risk       5.0   4.4  2 X Average Risk   9.6   7.1  3 X Average Risk  23.4   11.0        Use the calculated Patient Ratio above and the CHD Risk Table to determine the patient's CHD Risk.        ATP III CLASSIFICATION (LDL):  <100     mg/dL   Optimal  100-129  mg/dL   Near or Above                    Optimal  130-159  mg/dL   Borderline  160-189  mg/dL   High  >190     mg/dL   Very High     MICRO: 3/22 blood cx x 4 - strep viridans S ctx S PCN <0.06 IMAGING: No results found. TTE: - Mildly dilated left ventricle with mild LVH and LVEF   approximately 55%. Probable hypokinesis of the apical inferior   wall. Grade 1 diastolic dysfunction with increased filling   pressures. Calcified mitral annulus with mild mitral   regurgitation. Aortic valve is bicuspid and moderately  thickened.   Intermittently noted small echodensities seen near leaflet tips   in the parasternal views, although not well in other views.   Suspicious for vegetations particularly in light of bacteremia   although TEE is indicated for further assessment, and also to   exclude any associated abscess in light of fairly peripheral   aortic regurgitation. Aortic root is moderately dilated, measures   up  to 50 mm. Trivial tricuspid regurgitation with PASP estimated   28 mmHg. Echodensity noted in right atrium near the septum and   tricuspid valve plane, best seen in the apical views but not in   the subcostal views. 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 Possibly had low grade bacteremia after having influenza in December placing him at risk for endocarditis. Source is unclear.  NVE - recommend to follow up of repeat blood cultures from today to see he is clearing his bacteremia - will recommend to narrow abtx further to penicillin IV 24 MU over 24hr - will need TEE to have further description of aortic valve and if need for valve replacement  Back pain - would recommend imaging of spine to see if other foci of infection. He can undergo mri - he denies any metal implants  NSTEMI - appears that he has family history of cardiac disease. Agree with cardiology work up.   Leukocytosis = starting to trend down since initiation of abtx

## 2016-07-04 NOTE — Progress Notes (Signed)
PROGRESS NOTE        PATIENT DETAILS Name: Charles [name] Age: 71 y.o. Sex: male Date of Birth: 06/19/1945 Admit Date: 07/02/2016 Admitting Physician Bethena Roys, MD ZOX:WRUE [place] Crane, MD  Brief Narrative: Patient is a 71 y.o. 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 See below for further details.  Subjective: No chest pain or shortness of breath. Sitting at bedside.  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 Will likely need a TEE-have formally consulted cardiology. Have also consulted infectious disease. Await orthopantogram-may need to consult dentistry services on Monday.Once work up complete-will formally consult [facility] if needed  Elevated Troponin:?etiology-does have hx of occasional chest pain with mostly atypical features, however does have hypokinesis of apical inferior wall. Given his of AS-likely that he could have underlying CAD or could have septic embolic to his coronaries. Have formally consulted cardiology.  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: seen on CT Chest-will need annual imaging  Severe malnutrition: in context of acute illness/injury-continue supplements  Obesity unspecified  DVT Prophylaxis: Prophylactic Lovenox   Code Status: Full code  Family Communication: None at bedside  Disposition Plan: Remain inpatient  Antimicrobial agents: Anti-infectives     Start     Dose/Rate Route Frequency Ordered Stop   07/03/16 1800  cefTRIAXone (ROCEPHIN) 2 g in dextrose 5 % 50 mL IVPB     2 g 100 mL/hr over 30 Minutes Intravenous Every 24 hours 07/03/16 1711     07/03/16 0800  vancomycin (VANCOCIN) IVPB 750 mg/150 ml premix  Status:  Discontinued     750 mg 150 mL/hr over 60 Minutes Intravenous Every 12 hours 07/02/16 1953 07/03/16 1711   07/03/16 0000  piperacillin-tazobactam (ZOSYN) IVPB 3.375 g  Status:  Discontinued     3.375 g 12.5 mL/hr over 240 Minutes Intravenous Every 8 hours 07/02/16 1953 07/03/16 1205   07/02/16 2000  vancomycin (VANCOCIN) IVPB 1000 mg/200 mL premix     1,000 mg 200 mL/hr over 60 Minutes Intravenous  Once 07/02/16 1953 07/02/16 2232   07/02/16 1630  piperacillin-tazobactam (ZOSYN) IVPB 3.375 g     3.375 g 100 mL/hr over 30 Minutes Intravenous  Once 07/02/16 1623 07/02/16 1745   07/02/16 1630  vancomycin (VANCOCIN) IVPB 1000 mg/200 mL premix     1,000 mg 200 mL/hr over 60 Minutes Intravenous  Once 07/02/16 1623 07/02/16 1817      Procedures: Echo   CONSULTS:  cardiology   ID  Time spent: 25-minutes-Greater than 50% of this time was spent in counseling, explanation of diagnosis, planning of further management, and coordination of care.  MEDICATIONS: Scheduled Meds: . atorvastatin  40 mg Oral q1800  . cefTRIAXone (ROCEPHIN)  IV  2 g Intravenous Q24H  . enoxaparin (LOVENOX) injection  40 mg Subcutaneous Q24H  . feeding supplement  1 Container Oral TID BM  . feeding supplement (ENSURE ENLIVE)  237 mL Oral BID BM  .  feeding supplement (PRO-STAT SUGAR FREE 64)  30 mL Oral BID  . sodium chloride flush  3 mL Intravenous Q12H   Continuous Infusions: PRN Meds:.acetaminophen **OR** acetaminophen, ondansetron **OR** ondansetron (ZOFRAN) IV, senna-docusate   PHYSICAL EXAM: Vital signs: Vitals:   07/03/16 2318 07/04/16 0200 07/04/16 0500 07/04/16 0835  BP: 134/69 137/65  (!) 127/49  Pulse: 73 79  75  Resp: 18  16    Temp: 97.6 F (36.4 C) 98.3 F (36.8 C) 98.7 F (37.1 C) 98.2 F (36.8 C)  TempSrc: Oral Oral Oral Oral  SpO2: 98%   96%  Weight:  106.9 kg (235 lb 10.8 oz)    Height:  6' (1.829 m)     Filed Weights   07/02/16 1551 07/02/16 2002 07/04/16 0200  Weight: 108.9 kg (240 lb) 105.1 kg (231 lb 11.2 oz) 106.9 kg (235 lb 10.8 oz)   Body mass index is 31.96 kg/m.   General appearance :Awake, alert, not in any distress. Speech Clear.  Eyes:, pupils equally reactive to light and accomodation,no scleral icterus. HEENT: Atraumatic and Normocephalic Neck: supple, no JVD. No cervical lymphadenopathy. Resp:Good air entry bilaterally, no added sounds  CVS: S1 S2 regular, + syst murmur GI: Bowel sounds present, Non tender and not distended with no gaurding, rigidity or rebound.No organomegaly Extremities: B/L Lower Ext shows no edema, both legs are warm to touch Neurology:  speech clear,Non focal, sensation is grossly intact. Psychiatric: Normal judgment and insight. Alert and oriented x 3.  Musculoskeletal:No digital cyanosis Skin:No Rash, warm and dry Wounds:N/A  I have personally reviewed following labs and imaging studies  LABORATORY DATA: CBC:  Recent Labs Lab 07/01/16 2017 07/02/16 1744 07/03/16 0715  WBC 16.0*  --  12.9*  NEUTROABS 13.8*  --   --   HGB 10.2* 9.9* 8.5*  HCT 32.0* 29.0* 27.1*  MCV 80.4  --  79.2  PLT 299  --  716    Basic Metabolic Panel:  Recent Labs Lab 07/01/16 2017 07/01/16 2118 07/02/16 1744 07/03/16 0715  NA 134*  --  138 136  K 3.9  --  4.1 3.9  CL 101  --  102 106  CO2 24  --   --  24  GLUCOSE 148*  --  162* 123*  BUN 27*  --  33* 23*  CREATININE 1.37*  --  1.20 1.19  CALCIUM 8.5*  --   --  8.0*  MG  --  2.3  --   --     GFR: Estimated Creatinine Clearance: 75 mL/min (by C-G formula based on SCr of 1.19 mg/dL).  Liver Function Tests:  Recent Labs Lab 07/01/16 2118  AST 21  ALT 17  ALKPHOS 126  BILITOT 0.7  PROT 7.6    ALBUMIN 3.0*   No results for input(s): LIPASE, AMYLASE in the last 168 hours. No results for input(s): AMMONIA in the last 168 hours.  Coagulation Profile: No results for input(s): INR, PROTIME in the last 168 hours.  Cardiac Enzymes:  Recent Labs Lab 07/02/16 1954 07/03/16 0117 07/03/16 0715  TROPONINI 0.89* 1.01* 0.78*    BNP (last 3 results) No results for input(s): PROBNP in the last 8760 hours.  HbA1C: No results for input(s): HGBA1C in the last 72 hours.  CBG: No results for input(s): GLUCAP in the last 168 hours.  Lipid Profile:  Recent Labs  07/03/16 0715  CHOL 122  HDL 16*  LDLCALC 81  TRIG 125  CHOLHDL 7.6    Thyroid  Function Tests:  Recent Labs  07/01/16 2118  TSH 1.300    Anemia Panel:  Recent Labs  07/02/16 1954  FERRITIN 443*  TIBC 238*  IRON 37*    Urine analysis:    Component Value Date/Time   COLORURINE AMBER (A) 07/01/2016 2304   APPEARANCEUR HAZY (A) 07/01/2016 2304   LABSPEC 1.026 07/01/2016 2304   PHURINE 5.0 07/01/2016 2304   GLUCOSEU NEGATIVE 07/01/2016 2304   HGBUR MODERATE (A) 07/01/2016 2304   BILIRUBINUR NEGATIVE 07/01/2016 2304   KETONESUR 5 (A) 07/01/2016 2304   PROTEINUR 100 (A) 07/01/2016 2304   NITRITE NEGATIVE 07/01/2016 2304   LEUKOCYTESUR NEGATIVE 07/01/2016 2304    Sepsis Labs: Lactic Acid, Venous    Component Value Date/Time   LATICACIDVEN 1.21 07/02/2016 1744    MICROBIOLOGY: Recent Results (from the past 240 hour(s))  Blood culture (routine x 2)     Status: Abnormal   Collection Time: 07/02/16 12:08 AM  Result Value Ref Range Status   Specimen Description BLOOD RIGHT HAND  Final   Special Requests BOTTLES DRAWN AEROBIC AND ANAEROBIC [place]  Final   Culture  Setup Time   Final    GRAM POSITIVE COCCI Gram Stain Report Called to,Read Back By and Verified With: MINTER,R ON 07/02/16 @1515  BY MITCHELL,S IN BOTH AEROBIC AND ANAEROBIC BOTTLES    Culture (A)  Final    VIRIDANS  STREPTOCOCCUS SUSCEPTIBILITIES PERFORMED ON PREVIOUS CULTURE WITHIN THE LAST 5 DAYS. Performed at [facility], [place] [address] [place], [place] [address]    Report Status 07/04/2016 FINAL  Final  Blood culture (routine x 2)     Status: Abnormal   Collection Time: 07/02/16 12:17 AM  Result Value Ref Range Status   Specimen Description BLOOD RIGHT ARM  Final   Special Requests BOTTLES DRAWN AEROBIC AND ANAEROBIC [place]  Final   Culture  Setup Time   Final    GRAM POSITIVE COCCI Gram Stain Report Called to,Read Back By and Verified With: BECCA MINTER RN @ 1245 ON 3.22.18 BY BAYSE,L IN BOTH AEROBIC AND ANAEROBIC BOTTLES CRITICAL RESULT CALLED TO, READ BACK BY AND VERIFIED WITH: T BULLOCK,RN @0041  07/03/16 MKELLY,MLT Performed at [facility], [place] [address] [place], [place] [address]    Culture VIRIDANS STREPTOCOCCUS (A)  Final   Report Status 07/04/2016 FINAL  Final   Organism ID, Bacteria VIRIDANS STREPTOCOCCUS  Final      Susceptibility   Viridans streptococcus - MIC*    ERYTHROMYCIN 2 RESISTANT Resistant     LEVOFLOXACIN 1 SENSITIVE Sensitive     VANCOMYCIN 0.5 SENSITIVE Sensitive     * VIRIDANS STREPTOCOCCUS  Blood Culture ID Panel (Reflexed)     Status: Abnormal   Collection Time: 07/02/16 12:17 AM  Result Value Ref Range Status   Enterococcus species NOT DETECTED NOT DETECTED Final   Listeria monocytogenes NOT DETECTED NOT DETECTED Final   Staphylococcus species NOT DETECTED NOT DETECTED Final   Staphylococcus aureus NOT DETECTED NOT DETECTED Final   Streptococcus species DETECTED (A) NOT DETECTED Final    Comment: Not Enterococcus species, Streptococcus agalactiae, Streptococcus pyogenes, or Streptococcus pneumoniae. CRITICAL RESULT CALLED TO, READ BACK BY AND VERIFIED WITH: T BULLOCK,RN @0041  07/03/16 MKELLY,MLT    Streptococcus agalactiae NOT DETECTED NOT DETECTED Final   Streptococcus pneumoniae NOT DETECTED NOT DETECTED Final   Streptococcus pyogenes NOT  DETECTED NOT DETECTED Final   Acinetobacter baumannii NOT DETECTED NOT DETECTED Final   Enterobacteriaceae species NOT DETECTED NOT DETECTED Final  Enterobacter cloacae complex NOT DETECTED NOT DETECTED Final   Escherichia coli NOT DETECTED NOT DETECTED Final   Klebsiella oxytoca NOT DETECTED NOT DETECTED Final   Klebsiella pneumoniae NOT DETECTED NOT DETECTED Final   Proteus species NOT DETECTED NOT DETECTED Final   Serratia marcescens NOT DETECTED NOT DETECTED Final   Haemophilus influenzae NOT DETECTED NOT DETECTED Final   Neisseria meningitidis NOT DETECTED NOT DETECTED Final   Pseudomonas aeruginosa NOT DETECTED NOT DETECTED Final   Candida albicans NOT DETECTED NOT DETECTED Final   Candida glabrata NOT DETECTED NOT DETECTED Final   Candida krusei NOT DETECTED NOT DETECTED Final   Candida parapsilosis NOT DETECTED NOT DETECTED Final   Candida tropicalis NOT DETECTED NOT DETECTED Final    Comment: Performed at [facility], [place] [address] [place] [address], [place], [place] [address]  Culture, blood (Routine X 2) w Reflex to ID Panel     Status: None (Preliminary result)   Collection Time: 07/02/16  4:31 PM  Result Value Ref Range Status   Specimen Description RIGHT ANTECUBITAL  Final   Special Requests BOTTLES DRAWN AEROBIC AND ANAEROBIC 10 CC EACH  Final   Culture  Setup Time   Final    GRAM POSITIVE COCCI Gram Stain Report Called to,Read Back By and Verified With: JOHNSON B. AT [place] [id number] BY THOMPSON S. IN BOTH AEROBIC AND ANAEROBIC BOTTLES CRITICAL VALUE NOTED.  VALUE IS CONSISTENT WITH PREVIOUSLY REPORTED AND CALLED VALUE. Performed at [facility], [place] [address], [place], [place] [address]    Culture GRAM POSITIVE COCCI  Final   Report Status PENDING  Incomplete  Culture, blood (Routine X 2) w Reflex to ID Panel     Status: None (Preliminary result)   Collection Time: 07/02/16  4:34 PM  Result Value Ref Range Status   Specimen Description BLOOD RIGHT HAND  Final    Special Requests BOTTLES DRAWN AEROBIC AND ANAEROBIC 10CC EACH  Final   Culture  Setup Time   Final    GRAM POSITIVE COCCI Gram Stain Report Called to,Read Back By and Verified With: JOHNSON B. AT [place] ON [id number] BY THOMPSON S. IN BOTH AEROBIC AND ANAEROBIC BOTTLES CRITICAL VALUE NOTED.  VALUE IS CONSISTENT WITH PREVIOUSLY REPORTED AND CALLED VALUE. Performed at [facility], [place] [address], [place], [place] [address]    Culture [facility] POSITIVE COCCI  Final   Report Status PENDING  Incomplete    RADIOLOGY STUDIES/RESULTS: Dg Chest 2 View  Result Date: 07/01/2016 CLINICAL DATA:  71 year old male with night sweats and weight loss. EXAM: CHEST  2 VIEW COMPARISON:  Chest radiograph dated 05/01/2016 FINDINGS: The lungs are clear. There is no pleural effusion or pneumothorax. The cardiac silhouette is within normal limits. The aorta is tortuous. There is prominence of the aortic root on the lateral projection which may be the artifactual and related to tortuosity. Aneurysmal dilatation of the ascending aorta or aortic root is not excluded. CT of the chest with contrast may provide better evaluation. No acute osseous pathology identified. IMPRESSION: 1. No acute cardiopulmonary process. 2. Tortuous aorta with dilated appearance of the ascending aorta/aortic root. CT with contrast may provide better evaluation. Electronically Signed   By: Anner Crete M.D.   On: 07/01/2016 23:21   Ct Head Wo Contrast  Result Date: 07/01/2016 CLINICAL DATA:  Fall in December not feeling well EXAM: CT HEAD WITHOUT CONTRAST TECHNIQUE: Contiguous axial images were obtained from the base of the skull through the vertex without  intravenous contrast. COMPARISON:  None. FINDINGS: Brain: No acute territorial infarction, intracranial hemorrhage or focal mass lesion is visualized. The ventricles are nonenlarged. Mild atrophy. Vascular: No hyperdense vessels. Scattered calcifications at the carotid siphons. Skull: Normal.  Negative for fracture or focal lesion. Sinuses/Orbits: Minimal mucosal thickening in the ethmoid sinuses. Prior maxillary antrostomies. Tiny mucous retention cysts or small polyps in the maxillary sinuses. Other: None IMPRESSION: No CT evidence for acute intracranial abnormality. Electronically Signed   By: Donavan Foil M.D.   On: 07/01/2016 23:03   Ct Chest W Contrast  Result Date: 07/02/2016 CLINICAL DATA:  71 year old male with weight loss, night sweats, and abnormal chest x-ray. EXAM: CT CHEST WITH CONTRAST TECHNIQUE: Multidetector CT imaging of the chest was performed during intravenous contrast administration. CONTRAST:  4mL ISOVUE-300 IOPAMIDOL (ISOVUE-300) INJECTION 61% COMPARISON:  Chest radiograph dated 07/01/2016 FINDINGS: Cardiovascular: Top-normal cardiac size. No pericardial effusion. The thoracic aorta is tortuous. There is mild dilatation of the ascending aorta measuring up to 4.4 cm in diameter. No aortic dissection. The origins of the great vessels of the aortic arch appear patent. The central pulmonary arteries are grossly unremarkable. Evaluation of the pulmonary arteries is limited due to suboptimal opacification and timing of the contrast. Mediastinum/Nodes: There is no hilar or mediastinal adenopathy. The esophagus and the thyroid gland are grossly unremarkable. Lungs/Pleura: There is mild elevation of the left hemidiaphragm with linear left lung base atelectasis/ scarring. Pneumonia is less likely. There is no pleural effusion or pneumothorax. The central airways are patent. Upper Abdomen: There is apparent fatty infiltration of the liver. A 2.1 cm hypodense lesion in the caudate lobe is not well characterized but may represent a cyst or hemangioma. There is a 2.5 cm indeterminate right adrenal hypodense nodule, most likely an adenoma. The visualized upper abdomen is otherwise unremarkable. Musculoskeletal: Mild degenerative changes of spine. No acute osseous pathology. IMPRESSION: 1.  No acute intrathoracic pathology. 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 Recommend annual imaging followup by CTA or MRA. This recommendation follows 2010 ACCF/AHA/AATS/ACR/ASA/SCA/SCAI/SIR/STS/SVM Guidelines for the Diagnosis and Management of Patients with Thoracic Aortic Disease. Circulation. 2010; 121: R604-V409 2. Fatty liver. A small hypodense lesion in the caudate lobe is indeterminate but likely represents a cyst or hemangioma. 3. Probable right adrenal adenoma. Electronically Signed   By: Anner Crete M.D.   On: 07/02/2016 01:14     LOS: 2 days   Oren Binet, MD  [facility] Pager:[phone]  If 7PM-7AM, please contact night-coverage [email or web address] Password [facility] 07/04/2016, 11:27 AM

## 2016-07-04 NOTE — Plan of Care (Signed)
Problem: Safety: Goal: Ability to remain free from injury will improve Outcome: Progressing Patient noted with increased difficulty getting out of bed and ambulating. Patient encouraged to call staff before getting out of bed.   He verbalized understanding. Call bell within reach.

## 2016-07-04 NOTE — Consult Note (Addendum)
[address]       [place] [address]             [phone]          CARDIOTHORACIC SURGERY CONSULTATION REPORT  PCP is Wende Neighbors, MD Referring Provider is Ghimire, Henreitta Leber, MD  Reason for consultation:  Bacterial endocarditis  HPI:  Patient is a 71 year old moderately obese white male with no previous cardiac history other than a reported history of heart murmur who presented to the emergency department at [facility] with several month history of night sweats, generalized weakness, loss of appetite, and weight loss and was transferred to [facility] for further management of 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  The patient first began feeling poorly in December. He was evaluated in the emergency department at [facility] a total 3 times beginning on 04/09/2016.  He returned for the third visit on 07/02/2016 and complained of night sweats and chills with a 15 pound weight loss. Blood cultures were obtained at that time and ultimately turned positive for Streptococcus viridans. He was called at home and return to the emergency department where an echocardiogram was performed demonstrating what appeared to be bicuspid aortic valve with at least moderate aortic insufficiency and possible vegetation on the valve consistent with bacterial endocarditis. There was also enlargement of the aortic root and proximal aorta. CT angiogram of the chest was performed confirming moderate aneurysmal enlargement of the proximal ascending thoracic aorta without aortic dissection. The patient was transferred to [facility] for further management and cardiothoracic surgical consultation was requested.  The patient is married and lives with his wife in [place]. 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 He moved to [place] recently to be close to his son and his son's family. He admits to living a sedentary  lifestyle. However, he reports no significant physical limitations. He first began feeling poorly nearly 3 months ago. Symptoms have progressed and characterize as generalized weakness, decreased energy, malaise, night sweats, loss of appetite, and weight loss. He has not experienced shortness of breath or significant chest pain. He has recently began to have some vague myalgias and arthralgias in both legs. He denies any productive cough. He has not had headaches. He reports some transient visual disturbances in the right eye.  He denies and exertional chest discomfort or shortness of breath with ordinary activities, PND, orthopnea, palpitations, dizzy spells or syncope.   Past Medical History:  Diagnosis Date  . Anemia   . Aortic insufficiency   . Ascending aortic aneurysm (HCC)    4.6 cm  . Bacteremia 07/02/2016   Streptococcus viridans   . Bicuspid aortic valve     Past Surgical History:  Procedure Laterality Date  . HERNIA REPAIR     left inguinal    Family History  Problem Relation Age of Onset  . Ovarian cancer Mother   . Heart disease Father 79    Died of coronary thrombosis.      Social History   Social History  . Marital status: Married    Spouse name: N/A  . Number of children: 3  . Years of education: N/A   Occupational History  . Not on file.   Social History Main Topics  . Smoking status: Former Smoker    Types: Cigarettes  . Smokeless tobacco: Never Used     Comment: Quit 30 years ago.    . Alcohol use  No  . Drug use: No  . Sexual activity: Not on file   Other Topics Concern  . Not on file   Social History Narrative  . No narrative on file    Prior to Admission medications   Medication Sig Start Date End Date Taking? Authorizing Provider  ENSURE PLUS (ENSURE PLUS) LIQD Take 237 mLs by mouth daily.   Yes Historical Provider, MD  ferrous sulfate 325 (65 FE) MG tablet Take 325 mg by mouth daily with breakfast.   Yes Historical Provider, MD    Multiple Vitamin (MULTIVITAMIN WITH MINERALS) TABS tablet Take 1 tablet by mouth daily.   Yes Historical Provider, MD    Current Facility-Administered Medications  Medication Dose Route Frequency Provider Last Rate Last Dose  . acetaminophen (TYLENOL) tablet 650 mg  650 mg Oral Q6H PRN Bethena Roys, MD   650 mg at 07/04/16 9509   Or  . acetaminophen (TYLENOL) suppository 650 mg  650 mg Rectal Q6H PRN Ejiroghene E Emokpae, MD      . atorvastatin (LIPITOR) tablet 40 mg  40 mg Oral q1800 Ejiroghene E Emokpae, MD   40 mg at 07/04/16 1700  . cefTRIAXone (ROCEPHIN) 2 g in dextrose 5 % 50 mL IVPB  2 g Intravenous Q24H Erline Hau, MD   2 g at 07/04/16 1751  . enoxaparin (LOVENOX) injection 40 mg  40 mg Subcutaneous Q24H Ejiroghene E Denton Brick, MD   40 mg at 07/03/16 2251  . feeding supplement (BOOST / RESOURCE BREEZE) liquid 1 Container  1 Container Oral TID BM Estela Leonie Green, MD   1 Container at 07/03/16 1550  . feeding supplement (ENSURE ENLIVE) (ENSURE ENLIVE) liquid 237 mL  237 mL Oral BID BM Ejiroghene E Emokpae, MD   237 mL at 07/03/16 1551  . feeding supplement (PRO-STAT SUGAR FREE 64) liquid 30 mL  30 mL Oral BID Erline Hau, MD   30 mL at 07/04/16 0828  . ondansetron (ZOFRAN) tablet 4 mg  4 mg Oral Q6H PRN Ejiroghene Arlyce Dice, MD       Or  . ondansetron (ZOFRAN) injection 4 mg  4 mg Intravenous Q6H PRN Ejiroghene E Emokpae, MD      . senna-docusate (Senokot-S) tablet 1 tablet  1 tablet Oral QHS PRN Ejiroghene E Emokpae, MD      . sodium chloride flush (NS) 0.9 % injection 3 mL  3 mL Intravenous Q12H Ejiroghene Arlyce Dice, MD   3 mL at 07/04/16 0437    No Known Allergies    Review of Systems:   General:  decreased appetite, decreased energy, no weight gain, + significant weight loss, no fever, + night sweats  Cardiac:  no chest pain with exertion, no chest pain at rest, no SOB with exertion, no resting SOB, no PND, no orthopnea, no  palpitations, no arrhythmia, no atrial fibrillation, no LE edema, no dizzy spells, no syncope  Respiratory:  no shortness of breath, no home oxygen, no productive cough, no dry cough, no bronchitis, no wheezing, no hemoptysis, no asthma, no pain with inspiration or cough, no sleep apnea, no CPAP at night  GI:   no difficulty swallowing, no reflux, no frequent heartburn, no hiatal hernia, no abdominal pain, no constipation, no diarrhea, no hematochezia, no hematemesis, no melena  GU:   no dysuria,  no frequency, no urinary tract infection, no hematuria, no enlarged prostate, no kidney stones, no kidney disease  Vascular:  no pain suggestive of  claudication, no pain in feet, no leg cramps, no varicose veins, no DVT, no non-healing foot ulcer  Neuro:   no stroke, no TIA's, no seizures, no headaches, ? temporary blindness one eye,  no slurred speech, no peripheral neuropathy, no chronic pain, no instability of gait, no memory/cognitive dysfunction  Musculoskeletal: no arthritis, no joint swelling, + myalgias, no difficulty walking, normal mobility   Skin:   no rash, no itching, no skin infections, no pressure sores or ulcerations  Psych:   no anxiety, no depression, no nervousness, no unusual recent stress  Eyes:   no blurry vision, no floaters, no recent vision changes, + wears glasses or contacts  ENT:   no hearing loss, no loose or painful teeth, partial lower dentures, last saw dentist within the past 6 months  Hematologic:  no easy bruising, no abnormal bleeding, no clotting disorder, no frequent epistaxis  Endocrine:  no diabetes, does not check CBG's at home     Physical Exam:   BP (!) 137/57 (BP Location: Right Arm)   Pulse 67   Temp 97.6 F (36.4 C) (Oral)   Resp 20   Ht 6' (1.829 m)   Wt 235 lb 10.8 oz (106.9 kg)   SpO2 100%   BMI 31.96 kg/m   General:  Moderately obese male NAD    HEENT:  Unremarkable   Neck:   no JVD, no bruits, no adenopathy   Chest:   clear to auscultation,  symmetrical breath sounds, no wheezes, no rhonchi   CV:   RRR, grade IV/VI diastolic murmur   Abdomen:  soft, non-tender, no masses   Extremities:  warm, well-perfused, pulses palpable, no lower extremity edema  Rectal/GU  Deferred  Neuro:   Grossly non-focal and symmetrical throughout  Skin:   Clean and dry, no rashes, no breakdown  Diagnostic Tests:  Transthoracic Echocardiography  Patient:    Charles [name], Charles [name] MR #:       [id number] Study Date: 07/03/2016 Gender:     M Age:        71 Height:     182.9 cm Weight:     105.1 kg BSA:        2.34 m^2 Pt. Status: Room:       [place], Estela Y  ADMITTING    Emokpae, Ejiroghene E  Theador Hawthorne, Ejiroghene E  REFERRING    Emokpae, Ejiroghene E  PERFORMING   Chmg, Forestine Na  SONOGRAPHER  [employer], RCS  cc:  ------------------------------------------------------------------- LV EF: 55%  ------------------------------------------------------------------- Indications:      Murmur 785.2.  ------------------------------------------------------------------- History:   PMH:   Bacteremia.  PMH:  H/O cardiac murmur, aortic dilatation, NSTEMI.  ------------------------------------------------------------------- Study Conclusions  - Left ventricle: The cavity size was mildly dilated. Wall   thickness was increased in a pattern of mild LVH. Systolic   function was normal. The estimated ejection fraction was 55%.   Probable hypokinesis of the apicalinferior myocardium. Doppler   parameters are consistent with abnormal left ventricular   relaxation (grade 1 diastolic dysfunction). Doppler parameters   are consistent with high ventricular filling pressure. - Aortic valve: Bicuspid; moderately thickened leaflets.   Intermittently noted small echodensities seen near leaflet tips   in the parasternal views, although not well in other views.   Suspicious for vegetations particularly in light  of bacteremia   although TEE is indicated for further assessment, and also to   exclude any  associated abscess in light of fairly peripheral   aortic regurgitation. There was moderate regurgitation. Mean   gradient (S): 5 mm Hg. Peak gradient (S): 8 mm Hg. Regurgitation   pressure half-time: 383 ms. - Aorta: Aortic root dimension: 50 mm (ED). - Aortic root: The aortic root was moderately dilated. - Mitral valve: Calcified annulus. There was mild regurgitation. - Right atrium: Echodensity noted near the septum and tricuspid   valve plane, best seen in the apical views but not in the   subcostal views. Suspect artifact or perhaps visualization of   compressed free wall, although in light of bacteremia would   suggest further imaging with TEE to exclude other pathology.   Central venous pressure (est): 8 mm Hg. - Tricuspid valve: There was trivial regurgitation. - Pulmonary arteries: PA peak pressure: 28 mm Hg (S). - Pericardium, extracardiac: There was no pericardial effusion.  Impressions:  - Mildly dilated left ventricle with mild LVH and LVEF   approximately 55%. Probable hypokinesis of the apical inferior   wall. Grade 1 diastolic dysfunction with increased filling   pressures. Calcified mitral annulus with mild mitral   regurgitation. Aortic valve is bicuspid and moderately thickened.   Intermittently noted small echodensities seen near leaflet tips   in the parasternal views, although not well in other views.   Suspicious for vegetations particularly in light of bacteremia   although TEE is indicated for further assessment, and also to   exclude any associated abscess in light of fairly peripheral   aortic regurgitation. Aortic root is moderately dilated, measures   up to 50 mm. Trivial tricuspid regurgitation with PASP estimated   28 mmHg. Echodensity noted in right atrium near the septum and   tricuspid valve plane, best seen in the apical views but not in   the subcostal  views. Suspect artifact or perhaps visualization of   compressed free wall, although in light of bacteremia would   suggest further imaging with TEE to exclude other pathology.   Results discussed with Dr. Jerilee Hoh.  ------------------------------------------------------------------- Study data:   Study status:  Routine.  Procedure:  The patient reported no pain pre or post test. Transthoracic echocardiography. Image quality was adequate.  Study completion:  There were no complications.          Transthoracic echocardiography.  M-mode, complete 2D, spectral Doppler, and color Doppler.  Birthdate: Patient birthdate: Aug 24, 1945.  Age:  Patient is 71 yr old.  Sex: Gender: male.    BMI: 31.4 kg/m^2.  Blood pressure:     109/52 Patient status:  Inpatient.  Study date:  Study date: 07/03/2016. Study time: 01:35 PM.  Location:  Bedside.  -------------------------------------------------------------------  ------------------------------------------------------------------- Left ventricle:  The cavity size was mildly dilated. Wall thickness was increased in a pattern of mild LVH. Systolic function was normal. The estimated ejection fraction was 55%.  Regional wall motion abnormalities:   Probable hypokinesis of the apicalinferior myocardium. Doppler parameters are consistent with abnormal left ventricular relaxation (grade 1 diastolic dysfunction). Doppler parameters are consistent with high ventricular filling pressure.   ------------------------------------------------------------------- Aortic valve:   Bicuspid; moderately thickened leaflets. Intermittently noted small echodensities seen near leaflet tips in the parasternal views, although not well in other views. Suspicious for vegetations particularly in light of bacteremia although TEE is indicated for further assessment, and also to exclude any associated abscess in light of fairly peripheral aortic regurgitation.  Doppler:  There  was moderate regurgitation.    VTI ratio of LVOT to aortic valve: 1.01.  Valve area (VTI): 4.55 cm^2. Indexed valve area (VTI): 1.95 cm^2/m^2. Peak velocity ratio of LVOT to aortic valve: 0.98. Valve area (Vmax): 4.43 cm^2. Indexed valve area (Vmax): 1.89 cm^2/m^2. Mean velocity ratio of LVOT to aortic valve: 0.93. Valve area (Vmean): 4.2 cm^2. Indexed valve area (Vmean): 1.8 cm^2/m^2.    Mean gradient (S): 5 mm Hg. Peak gradient (S): 8 mm Hg.  ------------------------------------------------------------------- Aorta:  Aortic root: The aortic root was moderately dilated.  ------------------------------------------------------------------- Mitral valve:   Calcified annulus.  Doppler:  There was mild regurgitation.    Peak gradient (D): 5 mm Hg.  ------------------------------------------------------------------- Left atrium:  The atrium was normal in size.  ------------------------------------------------------------------- Right ventricle:  The cavity size was normal. Systolic function was normal.  ------------------------------------------------------------------- Pulmonic valve:    The valve appears to be grossly normal. Doppler:  There was no significant regurgitation.  ------------------------------------------------------------------- Tricuspid valve:   The valve appears to be grossly normal. Doppler:  There was trivial regurgitation.  ------------------------------------------------------------------- Right atrium:  Echodensity noted near the septum and tricuspid valve plane, best seen in the apical views but not in the subcostal views. Suspect artifact or perhaps visualization of compressed free wall, although in light of bacteremia would suggest further imaging with TEE to exclude other pathology. The atrium was normal in size.   ------------------------------------------------------------------- Pericardium:  There was no pericardial  effusion.  ------------------------------------------------------------------- Systemic veins: Inferior vena cava: The vessel was dilated. The respirophasic diameter changes were in the normal range (>= 50%).  ------------------------------------------------------------------- Measurements   Left ventricle                           Value          Reference  LV ID, ED, PLAX chordal          (H)     64    mm       43 - 52  LV ID, ES, PLAX chordal          (H)     43.2  mm       23 - 38  LV fx shortening, PLAX chordal           33    %        >=29  LV PW thickness, ED                      11.9  mm       ----------  IVS/LV PW ratio, ED                      1.18           <=1.3  Stroke volume, 2D                        139   ml       ----------  Stroke volume/bsa, 2D                    59    ml/m^2   ----------  LV ejection fraction, 1-p A4C            61    %        ----------  LV end-diastolic volume, 2-p             132   ml       ----------  LV end-systolic volume,  2-p              53    ml       ----------  LV ejection fraction, 2-p                60    %        ----------  Stroke volume, 2-p                       79    ml       ----------  LV end-diastolic volume/bsa, 2-p         56    ml/m^2   ----------  LV end-systolic volume/bsa, 2-p          23    ml/m^2   ----------  Stroke volume/bsa, 2-p                   33.6  ml/m^2   ----------  LV e&', lateral                           7.83  cm/s     ----------  LV E/e&', lateral                         14.69          ----------  LV e&', medial                            5.66  cm/s     ----------  LV E/e&', medial                          20.32          ----------  LV e&', average                           6.75  cm/s     ----------  LV E/e&', average                         17.05          ----------    Ventricular septum                       Value          Reference  IVS thickness, ED                        14    mm        ----------    LVOT                                     Value          Reference  LVOT ID, S                               24    mm       ----------  LVOT area  4.52  cm^2     ----------  LVOT peak velocity, S                    142   cm/s     ----------  LVOT mean velocity, S                    91.5  cm/s     ----------  LVOT VTI, S                              30.8  cm       ----------  LVOT peak gradient, S                    8     mm Hg    ----------    Aortic valve                             Value          Reference  Aortic valve peak velocity, S            145   cm/s     ----------  Aortic valve mean velocity, S            98.5  cm/s     ----------  Aortic valve VTI, S                      30.6  cm       ----------  Aortic mean gradient, S                  5     mm Hg    ----------  Aortic peak gradient, S                  8     mm Hg    ----------  VTI ratio, LVOT/AV                       1.01           ----------  Aortic valve area, VTI                   4.55  cm^2     ----------  Aortic valve area/bsa, VTI               1.95  cm^2/m^2 ----------  Velocity ratio, peak, LVOT/AV            0.98           ----------  Aortic valve area, peak velocity         4.43  cm^2     ----------  Aortic valve area/bsa, peak              1.89  cm^2/m^2 ----------  velocity  Velocity ratio, mean, LVOT/AV            0.93           ----------  Aortic valve area, mean velocity         4.2   cm^2     ----------  Aortic valve area/bsa, mean              1.8   cm^2/m^2 ----------  velocity  Aortic regurg pressure half-time  383   ms       ----------    Aorta                                    Value          Reference  Aortic root ID, ED                       50    mm       ----------    Left atrium                              Value          Reference  LA ID, A-P, ES                           31    mm       ----------  LA ID/bsa, A-P                            1.33  cm/m^2   <=2.2  LA volume, S                             59.8  ml       ----------  LA volume/bsa, S                         25.6  ml/m^2   ----------  LA volume, ES, 1-p A4C                   55.2  ml       ----------  LA volume/bsa, ES, 1-p A4C               23.6  ml/m^2   ----------  LA volume, ES, 1-p A2C                   61.5  ml       ----------  LA volume/bsa, ES, 1-p A2C               26.3  ml/m^2   ----------    Mitral valve                             Value          Reference  Mitral E-wave peak velocity              115   cm/s     ----------  Mitral A-wave peak velocity              116   cm/s     ----------  Mitral deceleration time                 176   ms       150 - 230  Mitral peak gradient, D                  5     mm Hg    ----------  Mitral E/A ratio, peak  1              ----------    Pulmonary arteries                       Value          Reference  PA pressure, S, DP                       28    mm Hg    <=30    Tricuspid valve                          Value          Reference  Tricuspid regurg peak velocity           224   cm/s     ----------  Tricuspid peak RV-RA gradient            20    mm Hg    ----------    Right atrium                             Value          Reference  RA ID, S-I, ES, A4C              (H)     60.5  mm       34 - 49  RA area, ES, A4C                 (H)     20.3  cm^2     8.3 - 19.5  RA volume, ES, A/L                       56.5  ml       ----------  RA volume/bsa, ES, A/L                   24.2  ml/m^2   ----------    Systemic veins                           Value          Reference  Estimated CVP                            8     mm Hg    ----------    Right ventricle                          Value          Reference  RV ID, ED, PLAX                          33.1  mm       19 - 38  TAPSE                                    19.8  mm       ----------  RV pressure, S, DP  28    mm Hg    <=30   RV s&', lateral, S                        15.1  cm/s     ----------  Legend: (L)  and  (H)  mark values outside specified reference range.  ------------------------------------------------------------------- Prepared and Electronically Authenticated by  Rozann Lesches, M.D. 2018-03-23T15:42:51    Results for ZACCHEUS, EDMISTER (MRN [id number]) as of 07/04/2016 19:06  Ref. Range 07/02/2016 00:17  Organism ID, Bacteria Unknown VIRIDANS STREPTOC...     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 I have personally reviewed the patient's recent transthoracic echocardiogram and CT angiogram. The patient has a bicuspid aortic valve with at least moderate aortic insufficiency. The jet of insufficiency is eccentric and could be related to leaflet prolapse but also might be secondary to leaflet perforation. There may be small vegetations on the valve, although these are not well seen. The aortic root is dilated.  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  There is no aortic dissection. The patient does not presently have any signs or symptoms of congestive heart failure nor evidence of septic embolization.   Plan:  I have discussed the patient's clinical problem and findings on his transthoracic echocardiogram at length with the patient at the bedside this evening. As a next step he needs transesophageal echocardiogram and dental service consultation in addition to consultation with Cardiology and Infectious Disease. In the absence of complicating features I favor continued medical therapy to treat the patient's bacterial endocarditis with close follow-up to look for signs of the development of acute congestive heart failure.  At some point the patient may need aortic valve repair or  replacement, but there are no clear indications for surgery at this time. We'll continue to follow periodically.   I spent in excess of 120 minutes during the conduct of this hospital consultation and >50% of this time involved direct face-to-face encounter for counseling and/or coordination of the patient's care.    Valentina Gu. Roxy Manns, MD 07/04/2016 7:06 PM

## 2016-07-05 ENCOUNTER — Inpatient Hospital Stay (HOSPITAL_COMMUNITY): Payer: Medicare Other

## 2016-07-05 ENCOUNTER — Telehealth: Payer: Self-pay

## 2016-07-05 DIAGNOSIS — D509 Iron deficiency anemia, unspecified: Secondary | ICD-10-CM

## 2016-07-05 DIAGNOSIS — R778 Other specified abnormalities of plasma proteins: Secondary | ICD-10-CM

## 2016-07-05 DIAGNOSIS — R634 Abnormal weight loss: Secondary | ICD-10-CM | POA: Diagnosis present

## 2016-07-05 DIAGNOSIS — I330 Acute and subacute infective endocarditis: Secondary | ICD-10-CM | POA: Diagnosis present

## 2016-07-05 DIAGNOSIS — R7881 Bacteremia: Principal | ICD-10-CM

## 2016-07-05 LAB — CBC
HEMATOCRIT: 27.4 % — AB (ref 39.0–52.0)
Hemoglobin: 8.5 g/dL — ABNORMAL LOW (ref 13.0–17.0)
MCH: 25.1 pg — AB (ref 26.0–34.0)
MCHC: 31 g/dL (ref 30.0–36.0)
MCV: 80.8 fL (ref 78.0–100.0)
PLATELETS: 246 10*3/uL (ref 150–400)
RBC: 3.39 MIL/uL — AB (ref 4.22–5.81)
RDW: 17.2 % — ABNORMAL HIGH (ref 11.5–15.5)
WBC: 8 10*3/uL (ref 4.0–10.5)

## 2016-07-05 LAB — CULTURE, BLOOD (ROUTINE X 2)

## 2016-07-05 LAB — BLOOD GAS, ARTERIAL
Acid-base deficit: 0.4 mmol/L (ref 0.0–2.0)
BICARBONATE: 22.5 mmol/L (ref 20.0–28.0)
Drawn by: 437071
FIO2: 21
O2 Saturation: 97.5 %
PATIENT TEMPERATURE: 97.9
PH ART: 7.502 — AB (ref 7.350–7.450)
PO2 ART: 88.1 mmHg (ref 83.0–108.0)
pCO2 arterial: 28.9 mmHg — ABNORMAL LOW (ref 32.0–48.0)

## 2016-07-05 LAB — COMPREHENSIVE METABOLIC PANEL
ALT: 17 U/L (ref 17–63)
AST: 16 U/L (ref 15–41)
Albumin: 2.2 g/dL — ABNORMAL LOW (ref 3.5–5.0)
Alkaline Phosphatase: 82 U/L (ref 38–126)
Anion gap: 9 (ref 5–15)
BILIRUBIN TOTAL: 0.3 mg/dL (ref 0.3–1.2)
BUN: 19 mg/dL (ref 6–20)
CALCIUM: 8.3 mg/dL — AB (ref 8.9–10.3)
CHLORIDE: 106 mmol/L (ref 101–111)
CO2: 23 mmol/L (ref 22–32)
CREATININE: 1.02 mg/dL (ref 0.61–1.24)
Glucose, Bld: 101 mg/dL — ABNORMAL HIGH (ref 65–99)
Potassium: 4.2 mmol/L (ref 3.5–5.1)
Sodium: 138 mmol/L (ref 135–145)
TOTAL PROTEIN: 6.2 g/dL — AB (ref 6.5–8.1)

## 2016-07-05 LAB — PREALBUMIN: PREALBUMIN: 14.8 mg/dL — AB (ref 18–38)

## 2016-07-05 MED ORDER — FERROUS SULFATE 325 (65 FE) MG PO TABS
325.0000 mg | ORAL_TABLET | Freq: Every day | ORAL | Status: DC
  Administered 2016-07-05 – 2016-07-09 (×5): 325 mg via ORAL
  Filled 2016-07-05 (×6): qty 1

## 2016-07-05 MED ORDER — PENICILLIN G POTASSIUM 20000000 UNITS IJ SOLR
12.0000 10*6.[IU] | Freq: Two times a day (BID) | INTRAVENOUS | Status: DC
  Administered 2016-07-05 – 2016-07-08 (×7): 12 10*6.[IU] via INTRAVENOUS
  Filled 2016-07-05 (×9): qty 12

## 2016-07-05 NOTE — Telephone Encounter (Signed)
+   BC from ED visit 07/02/16. Admitted to [facility] and being treated

## 2016-07-05 NOTE — Progress Notes (Signed)
PROGRESS NOTE        PATIENT DETAILS Name: Charles [name] Age: 71 y.o. Sex: male Date of Birth: Sep 06, 1945 Admit Date: 07/02/2016 Admitting Physician Bethena Roys, MD MBT:[facility] [place] Crane, MD  Brief Narrative: Patient is a 71 y.o. 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 See below for further details.  Subjective: No chest pain or shortness of breath. Sitting at bedside.  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 Cardiology planning on TEE.  ID/CTVS following. Orthopantogram-shows possible periodontal disease affecting the right mandbular pre-molar-will d/w Dr Enrique Sack on 3/26  Elevated Troponin:?etiology-does have hx of occasional chest pain with mostly atypical features, however does have hypokinesis of apical inferior wall. Given his of AS-likely that he could have underlying CAD or could have septic embolic to his coronaries. Cardiology following  Anemia: probably secondary to acute illness-but apparently has Fe def anemia at baseline. Restart Fe supplementation. Per patient he has not had a screening colonoscopy-he will need to do in the outpatient setting.  Hx of Biscuspic aortic valve:see above  Mildly dilated ascending aorta measuring up to 4.4 cm in diameter: seen on CT Chest-likely related to Biscuspid aortic valve-will need annual imaging  Severe malnutrition: in context of acute illness/injury-continue supplements  Obesity unspecified  DVT Prophylaxis: Prophylactic Lovenox   Code Status: Full code  Family Communication: None at bedside  Disposition  Plan: Remain inpatient  Antimicrobial agents: Anti-infectives    Start     Dose/Rate Route Frequency Ordered Stop   07/03/16 1800  cefTRIAXone (ROCEPHIN) 2 g in dextrose 5 % 50 mL IVPB     2 g 100 mL/hr over 30 Minutes Intravenous Every 24 hours 07/03/16 1711     07/03/16 0800  vancomycin (VANCOCIN) IVPB 750 mg/150 ml premix  Status:  Discontinued     750 mg 150 mL/hr over 60 Minutes Intravenous Every 12 hours 07/02/16 1953 07/03/16 1711   07/03/16 0000  piperacillin-tazobactam (ZOSYN) IVPB 3.375 g  Status:  Discontinued     3.375 g 12.5 mL/hr over 240 Minutes Intravenous Every 8 hours 07/02/16 1953 07/03/16 1205   07/02/16 2000  vancomycin (VANCOCIN) IVPB 1000 mg/200 mL premix     1,000 mg 200 mL/hr over 60 Minutes Intravenous  Once 07/02/16 1953 07/02/16 2232   07/02/16 1630  piperacillin-tazobactam (ZOSYN) IVPB 3.375 g     3.375 g 100 mL/hr over 30 Minutes Intravenous  Once 07/02/16 1623 07/02/16 1745   07/02/16 1630  vancomycin (VANCOCIN) IVPB 1000 mg/200 mL premix     1,000 mg 200 mL/hr over 60 Minutes Intravenous  Once 07/02/16 1623 07/02/16 1817      Procedures: Echo   CONSULTS:  cardiology   ID  Time spent: 25-minutes-Greater than 50% of this time was spent in counseling, explanation of diagnosis, planning of further management, and coordination of care.  MEDICATIONS: Scheduled Meds: . atorvastatin  40 mg Oral q1800  . cefTRIAXone (ROCEPHIN)  IV  2 g Intravenous Q24H  . enoxaparin (LOVENOX) injection  40 mg Subcutaneous Q24H  . feeding supplement  1 Container Oral TID BM  .  feeding supplement (ENSURE ENLIVE)  237 mL Oral BID BM  . feeding supplement (PRO-STAT SUGAR FREE 64)  30 mL Oral BID  . sodium chloride flush  3 mL Intravenous Q12H   Continuous Infusions: PRN Meds:.acetaminophen **OR** acetaminophen, ondansetron **OR** ondansetron (ZOFRAN) IV, senna-docusate   PHYSICAL EXAM: Vital signs: Vitals:   07/04/16 0835 07/04/16 1709 07/04/16 2010 07/05/16  0526  BP: (!) 127/49 (!) 137/57 (!) 115/46 (!) 128/55  Pulse: 75 67 81 69  Resp:   19 17  Temp: 98.2 F (36.8 C) 97.6 F (36.4 C) 97.7 F (36.5 C) 99.1 F (37.3 C)  TempSrc: Oral Oral Oral Oral  SpO2: 96% 100%  95%  Weight:    97.8 kg (215 lb 9.6 oz)  Height:       Filed Weights   07/02/16 2002 07/04/16 0200 07/05/16 0526  Weight: 105.1 kg (231 lb 11.2 oz) 106.9 kg (235 lb 10.8 oz) 97.8 kg (215 lb 9.6 oz)   Body mass index is 29.24 kg/m.   General appearance :Awake, alert, not in any distress. Lying comfortably in bed.  Eyes:, pupils equally reactive to light and accomodation HEENT: Atraumatic and Normocephalic Neck: supple, no JVD. No cervical lymphadenopathy. Resp:Good air entry bilaterally, no rhonchi CVS: S1 S2 regular, + syst murmur GI: Bowel sounds present, Non tender and not distended with no gaurding, rigidity or rebound.No organomegaly Extremities: B/L Lower Ext shows no edema, both legs are warm to touch Neurology:  speech clear,Non focal, sensation is grossly intact. Psychiatric: Normal judgment and insight. Alert and oriented x 3.  Musculoskeletal:No digital cyanosis Skin:No Rash, warm and dry Wounds:N/A  I have personally reviewed following labs and imaging studies  LABORATORY DATA: CBC:  Recent Labs Lab 07/01/16 2017 07/02/16 1744 07/03/16 0715 07/05/16 0300  WBC 16.0*  --  12.9* 8.0  NEUTROABS 13.8*  --   --   --   HGB 10.2* 9.9* 8.5* 8.5*  HCT 32.0* 29.0* 27.1* 27.4*  MCV 80.4  --  79.2 80.8  PLT 299  --  237 409    Basic Metabolic Panel:  Recent Labs Lab 07/01/16 2017 07/01/16 2118 07/02/16 1744 07/03/16 0715 07/05/16 0300  NA 134*  --  138 136 138  K 3.9  --  4.1 3.9 4.2  CL 101  --  102 106 106  CO2 24  --   --  24 23  GLUCOSE 148*  --  162* 123* 101*  BUN 27*  --  33* 23* 19  CREATININE 1.37*  --  1.20 1.19 1.02  CALCIUM 8.5*  --   --  8.0* 8.3*  MG  --  2.3  --   --   --     GFR: Estimated Creatinine Clearance: 84 mL/min  (by C-G formula based on SCr of 1.02 mg/dL).  Liver Function Tests:  Recent Labs Lab 07/01/16 2118 07/05/16 0300  AST 21 16  ALT 17 17  ALKPHOS 126 82  BILITOT 0.7 0.3  PROT 7.6 6.2*  ALBUMIN 3.0* 2.2*   No results for input(s): LIPASE, AMYLASE in the last 168 hours. No results for input(s): AMMONIA in the last 168 hours.  Coagulation Profile: No results for input(s): INR, PROTIME in the last 168 hours.  Cardiac Enzymes:  Recent Labs Lab 07/02/16 1954 07/03/16 0117 07/03/16 0715  TROPONINI 0.89* 1.01* 0.78*    BNP (last 3 results) No results for input(s): PROBNP in the last 8760 hours.  HbA1C: No results for input(s): HGBA1C in the  last 72 hours.  CBG: No results for input(s): GLUCAP in the last 168 hours.  Lipid Profile:  Recent Labs  07/03/16 0715  CHOL 122  HDL 16*  LDLCALC 81  TRIG 125  CHOLHDL 7.6    Thyroid Function Tests: No results for input(s): TSH, T4TOTAL, FREET4, T3FREE, THYROIDAB in the last 72 hours.  Anemia Panel:  Recent Labs  07/02/16 1954 07/04/16 1248  VITAMINB12  --  636  FOLATE  --  27.6  FERRITIN 443* 411*  TIBC 238* 234*  IRON 37* 27*  RETICCTPCT  --  1.5    Urine analysis:    Component Value Date/Time   COLORURINE AMBER (A) 07/01/2016 2304   APPEARANCEUR HAZY (A) 07/01/2016 2304   LABSPEC 1.026 07/01/2016 2304   PHURINE 5.0 07/01/2016 2304   GLUCOSEU NEGATIVE 07/01/2016 2304   HGBUR MODERATE (A) 07/01/2016 2304   BILIRUBINUR NEGATIVE 07/01/2016 2304   KETONESUR 5 (A) 07/01/2016 2304   PROTEINUR 100 (A) 07/01/2016 2304   NITRITE NEGATIVE 07/01/2016 2304   LEUKOCYTESUR NEGATIVE 07/01/2016 2304    Sepsis Labs: Lactic Acid, Venous    Component Value Date/Time   LATICACIDVEN 1.21 07/02/2016 1744    MICROBIOLOGY: Recent Results (from the past 240 hour(s))  Blood culture (routine x 2)     Status: Abnormal   Collection Time: 07/02/16 12:08 AM  Result Value Ref Range Status   Specimen Description BLOOD  RIGHT HAND  Final   Special Requests BOTTLES DRAWN AEROBIC AND ANAEROBIC [place]  Final   Culture  Setup Time   Final    GRAM POSITIVE COCCI Gram Stain Report Called to,Read Back By and Verified With: MINTER,R ON 07/02/16 @1515  BY MITCHELL,S IN BOTH AEROBIC AND ANAEROBIC BOTTLES    Culture (A)  Final    VIRIDANS STREPTOCOCCUS SUSCEPTIBILITIES PERFORMED ON PREVIOUS CULTURE WITHIN THE LAST 5 DAYS. Performed at [facility], [place] [address] [place], [place] [address]    Report Status 07/04/2016 FINAL  Final  Blood culture (routine x 2)     Status: Abnormal   Collection Time: 07/02/16 12:17 AM  Result Value Ref Range Status   Specimen Description BLOOD RIGHT ARM  Final   Special Requests BOTTLES DRAWN AEROBIC AND ANAEROBIC [place]  Final   Culture  Setup Time   Final    GRAM POSITIVE COCCI Gram Stain Report Called to,Read Back By and Verified With: BECCA MINTER RN @ 7782 ON 3.22.18 BY BAYSE,L IN BOTH AEROBIC AND ANAEROBIC BOTTLES CRITICAL RESULT CALLED TO, READ BACK BY AND VERIFIED WITH: T BULLOCK,RN @0041  07/03/16 MKELLY,MLT Performed at [facility], [place] [address], [place], [place] [address]    Culture VIRIDANS STREPTOCOCCUS (A)  Final   Report Status 07/04/2016 FINAL  Final   Organism ID, Bacteria VIRIDANS STREPTOCOCCUS  Final      Susceptibility   Viridans streptococcus - MIC*    PENICILLIN <=0.06 SENSITIVE Sensitive     CEFTRIAXONE <=0.12 SENSITIVE Sensitive     ERYTHROMYCIN 2 RESISTANT Resistant     LEVOFLOXACIN 1 SENSITIVE Sensitive     VANCOMYCIN 0.5 SENSITIVE Sensitive     * VIRIDANS STREPTOCOCCUS  Blood Culture ID Panel (Reflexed)     Status: Abnormal   Collection Time: 07/02/16 12:17 AM  Result Value Ref Range Status   Enterococcus species NOT DETECTED NOT DETECTED Final   Listeria monocytogenes NOT DETECTED NOT DETECTED Final   Staphylococcus species NOT DETECTED NOT DETECTED Final   Staphylococcus aureus NOT DETECTED NOT DETECTED Final  Streptococcus species  DETECTED (A) NOT DETECTED Final    Comment: Not Enterococcus species, Streptococcus agalactiae, Streptococcus pyogenes, or Streptococcus pneumoniae. CRITICAL RESULT CALLED TO, READ BACK BY AND VERIFIED WITH: T BULLOCK,RN @0041  07/03/16 MKELLY,MLT    Streptococcus agalactiae NOT DETECTED NOT DETECTED Final   Streptococcus pneumoniae NOT DETECTED NOT DETECTED Final   Streptococcus pyogenes NOT DETECTED NOT DETECTED Final   Acinetobacter baumannii NOT DETECTED NOT DETECTED Final   Enterobacteriaceae species NOT DETECTED NOT DETECTED Final   Enterobacter cloacae complex NOT DETECTED NOT DETECTED Final   Escherichia coli NOT DETECTED NOT DETECTED Final   Klebsiella oxytoca NOT DETECTED NOT DETECTED Final   Klebsiella pneumoniae NOT DETECTED NOT DETECTED Final   Proteus species NOT DETECTED NOT DETECTED Final   Serratia marcescens NOT DETECTED NOT DETECTED Final   Haemophilus influenzae NOT DETECTED NOT DETECTED Final   Neisseria meningitidis NOT DETECTED NOT DETECTED Final   Pseudomonas aeruginosa NOT DETECTED NOT DETECTED Final   Candida albicans NOT DETECTED NOT DETECTED Final   Candida glabrata NOT DETECTED NOT DETECTED Final   Candida krusei NOT DETECTED NOT DETECTED Final   Candida parapsilosis NOT DETECTED NOT DETECTED Final   Candida tropicalis NOT DETECTED NOT DETECTED Final    Comment: Performed at [facility], [place] [address] [place], [place] [address]  Culture, blood (Routine X 2) w Reflex to ID Panel     Status: Abnormal   Collection Time: 07/02/16  4:31 PM  Result Value Ref Range Status   Specimen Description RIGHT ANTECUBITAL  Final   Special Requests BOTTLES DRAWN AEROBIC AND ANAEROBIC 10 CC EACH  Final   Culture  Setup Time   Final    GRAM POSITIVE COCCI Gram Stain Report Called to,Read Back By and Verified With: JOHNSON B. AT [place] ON [id number] BY THOMPSON S. IN BOTH AEROBIC AND ANAEROBIC BOTTLES CRITICAL VALUE NOTED.  VALUE IS CONSISTENT WITH PREVIOUSLY REPORTED AND  CALLED VALUE.    Culture (A)  Final    VIRIDANS STREPTOCOCCUS SUSCEPTIBILITIES PERFORMED ON PREVIOUS CULTURE WITHIN THE LAST 5 DAYS. Performed at [facility], [place] [address], [place], [place] [address]    Report Status 07/05/2016 FINAL  Final  Culture, blood (Routine X 2) w Reflex to ID Panel     Status: Abnormal   Collection Time: 07/02/16  4:34 PM  Result Value Ref Range Status   Specimen Description BLOOD RIGHT HAND  Final   Special Requests BOTTLES DRAWN AEROBIC AND ANAEROBIC 10CC EACH  Final   Culture  Setup Time   Final    GRAM POSITIVE COCCI Gram Stain Report Called to,Read Back By and Verified With: JOHNSON B. AT [place] ON [id number] BY THOMPSON S. IN BOTH AEROBIC AND ANAEROBIC BOTTLES CRITICAL VALUE NOTED.  VALUE IS CONSISTENT WITH PREVIOUSLY REPORTED AND CALLED VALUE.    Culture (A)  Final    VIRIDANS STREPTOCOCCUS SUSCEPTIBILITIES PERFORMED ON PREVIOUS CULTURE WITHIN THE LAST 5 DAYS. Performed at [facility], [place] [address], [place], [place] [address]    Report Status 07/05/2016 FINAL  Final    RADIOLOGY STUDIES/RESULTS: Dg Orthopantogram  Result Date: 07/05/2016 CLINICAL DATA:  Congestive heart failure. History of dental work. Denies jaw pain. EXAM: ORTHOPANTOGRAM/PANORAMIC COMPARISON:  None. FINDINGS: Several root canal procedures have been performed. Surrounding a mandibular RIGHT premolar tooth, there is a periapical lucency (arrow). Several teeth are also missing. IMPRESSION: Query periodontal disease affecting a RIGHT mandibular premolar. Electronically Signed   By: Roderic Ovens.D.  On: 07/05/2016 09:07   Dg Chest 2 View  Result Date: 07/05/2016 CLINICAL DATA:  Patient with history of congestive heart failure. EXAM: CHEST  2 VIEW COMPARISON:  Chest radiograph 07/01/2016; chest CT 07/02/2016. FINDINGS: Monitoring leads overlie the patient. Stable prominent cardiac and mediastinal contours with tortuosity of the thoracic aorta. Minimal heterogeneous  opacities lung bases bilaterally. Stable biapical pleuroparenchymal thickening. Lateral view limited due to overlapping soft tissue. Thoracic spine degenerative changes. IMPRESSION: Tortuous aorta. Aortic atherosclerosis. Basilar atelectasis. Electronically Signed   By: Lovey Newcomer M.D.   On: 07/05/2016 08:12   Dg Chest 2 View  Result Date: 07/01/2016 CLINICAL DATA:  71 year old male with night sweats and weight loss. EXAM: CHEST  2 VIEW COMPARISON:  Chest radiograph dated 05/01/2016 FINDINGS: The lungs are clear. There is no pleural effusion or pneumothorax. The cardiac silhouette is within normal limits. The aorta is tortuous. There is prominence of the aortic root on the lateral projection which may be the artifactual and related to tortuosity. Aneurysmal dilatation of the ascending aorta or aortic root is not excluded. CT of the chest with contrast may provide better evaluation. No acute osseous pathology identified. IMPRESSION: 1. No acute cardiopulmonary process. 2. Tortuous aorta with dilated appearance of the ascending aorta/aortic root. CT with contrast may provide better evaluation. Electronically Signed   By: Anner Crete M.D.   On: 07/01/2016 23:21   Ct Head Wo Contrast  Result Date: 07/01/2016 CLINICAL DATA:  Fall in December not feeling well EXAM: CT HEAD WITHOUT CONTRAST TECHNIQUE: Contiguous axial images were obtained from the base of the skull through the vertex without intravenous contrast. COMPARISON:  None. FINDINGS: Brain: No acute territorial infarction, intracranial hemorrhage or focal mass lesion is visualized. The ventricles are nonenlarged. Mild atrophy. Vascular: No hyperdense vessels. Scattered calcifications at the carotid siphons. Skull: Normal. Negative for fracture or focal lesion. Sinuses/Orbits: Minimal mucosal thickening in the ethmoid sinuses. Prior maxillary antrostomies. Tiny mucous retention cysts or small polyps in the maxillary sinuses. Other: None IMPRESSION: No  CT evidence for acute intracranial abnormality. Electronically Signed   By: Donavan Foil M.D.   On: 07/01/2016 23:03   Ct Chest W Contrast  Result Date: 07/02/2016 CLINICAL DATA:  71 year old male with weight loss, night sweats, and abnormal chest x-ray. EXAM: CT CHEST WITH CONTRAST TECHNIQUE: Multidetector CT imaging of the chest was performed during intravenous contrast administration. CONTRAST:  37mL ISOVUE-300 IOPAMIDOL (ISOVUE-300) INJECTION 61% COMPARISON:  Chest radiograph dated 07/01/2016 FINDINGS: Cardiovascular: Top-normal cardiac size. No pericardial effusion. The thoracic aorta is tortuous. There is mild dilatation of the ascending aorta measuring up to 4.4 cm in diameter. No aortic dissection. The origins of the great vessels of the aortic arch appear patent. The central pulmonary arteries are grossly unremarkable. Evaluation of the pulmonary arteries is limited due to suboptimal opacification and timing of the contrast. Mediastinum/Nodes: There is no hilar or mediastinal adenopathy. The esophagus and the thyroid gland are grossly unremarkable. Lungs/Pleura: There is mild elevation of the left hemidiaphragm with linear left lung base atelectasis/ scarring. Pneumonia is less likely. There is no pleural effusion or pneumothorax. The central airways are patent. Upper Abdomen: There is apparent fatty infiltration of the liver. A 2.1 cm hypodense lesion in the caudate lobe is not well characterized but may represent a cyst or hemangioma. There is a 2.5 cm indeterminate right adrenal hypodense nodule, most likely an adenoma. The visualized upper abdomen is otherwise unremarkable. Musculoskeletal: Mild degenerative changes of spine. No acute osseous pathology.  IMPRESSION: 1. No acute intrathoracic pathology. 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 Recommend annual imaging followup by CTA or MRA. This recommendation follows 2010 ACCF/AHA/AATS/ACR/ASA/SCA/SCAI/SIR/STS/SVM  Guidelines for the Diagnosis and Management of Patients with Thoracic Aortic Disease. Circulation. 2010; 121: Q773-P366 2. Fatty liver. A small hypodense lesion in the caudate lobe is indeterminate but likely represents a cyst or hemangioma. 3. Probable right adrenal adenoma. Electronically Signed   By: Anner Crete M.D.   On: 07/02/2016 01:14     LOS: 3 days   Oren Binet, MD  [facility] Pager:[phone]  If 7PM-7AM, please contact night-coverage [email or web address] Password [facility] 07/05/2016, 9:40 AM

## 2016-07-05 NOTE — Progress Notes (Signed)
Message sent to schedule TEE when endoscopy opens Monday.  Charles [name] make NPO after midnight tonight.  Allegra Lai, MD

## 2016-07-05 NOTE — Progress Notes (Signed)
5 bts V tach noted on telemetry. Asymptomatic. Cards Fellow Mliss Sax, MD) notified via [facility] text messaging.

## 2016-07-05 NOTE — Progress Notes (Signed)
Pt noted to be in short run of Mobitz I and Mobitz II for approx 1 min prior to returning spontaneously to NSR. Pt asymptomatic. Pecolia Ades, PA made aware. Will continue to monitor.

## 2016-07-05 NOTE — Progress Notes (Addendum)
[facility] for Infectious Disease    Date of Admission:  07/02/2016   Total days of antibiotics 4        Day 3 ctx   ID: Charles [name] is a 71 y.o. male with viridans strep native aortic valve endocarditis Principal Problem:   Bacteremia Active Problems:   Anemia   H/O cardiac murmur   Ascending aortic aneurysm (HCC)   NSTEMI (non-ST elevated myocardial infarction) (HCC)   Protein-calorie malnutrition, severe   Bicuspid aortic valve   Aortic insufficiency    Subjective: Less subjective nightsweats. Afebrile.   Evaluated by cards, CT surgery, plan for TEE tomorrow.   Medications:  . atorvastatin  40 mg Oral q1800  . cefTRIAXone (ROCEPHIN)  IV  2 g Intravenous Q24H  . enoxaparin (LOVENOX) injection  40 mg Subcutaneous Q24H  . feeding supplement  1 Container Oral TID BM  . feeding supplement (ENSURE ENLIVE)  237 mL Oral BID BM  . feeding supplement (PRO-STAT SUGAR FREE 64)  30 mL Oral BID  . [START ON 07/06/2016] ferrous sulfate  325 mg Oral Q breakfast  . sodium chloride flush  3 mL Intravenous Q12H    Objective: Vital signs in last 24 hours: Temp:  [97.6 F (36.4 C)-99.1 F (37.3 C)] 99.1 F (37.3 C) (03/25 0526) Pulse Rate:  [67-81] 69 (03/25 0526) Resp:  [17-19] 17 (03/25 0526) BP: (115-137)/(46-57) 128/55 (03/25 0526) SpO2:  [95 %-100 %] 95 % (03/25 0526) Weight:  [215 lb 9.6 oz (97.8 kg)] 215 lb 9.6 oz (97.8 kg) (03/25 0526) Physical Exam  Constitutional: He is oriented to person, place, and time. He appears well-developed and well-nourished. No distress.  HENT:  Mouth/Throat: Oropharynx is clear and moist. No oropharyngeal exudate.  Cardiovascular: Normal rate, regular rhythm and normal heart sounds. 2/6 SM apex. Exam reveals no gallop and no friction rub.  Pulmonary/Chest: Effort normal and breath sounds normal. No respiratory distress. He has no wheezes.  Abdominal: Soft. Bowel sounds are normal. He exhibits no distension. There is no tenderness.    Lymphadenopathy:  He has no cervical adenopathy.  Neurological: He is alert and oriented to person, place, and time.  Skin: Skin is warm and dry. No rash noted. No erythema. No stigmata of endocarditis, no splinter hemorrhage Psychiatric: He has a normal mood and affect. His behavior is normal.     Lab Results  Recent Labs  07/03/16 0715 07/05/16 0300  WBC 12.9* 8.0  HGB 8.5* 8.5*  HCT 27.1* 27.4*  NA 136 138  K 3.9 4.2  CL 106 106  CO2 24 23  BUN 23* 19  CREATININE 1.19 1.02   Liver Panel  Recent Labs  07/05/16 0300  PROT 6.2*  ALBUMIN 2.2*  AST 16  ALT 17  ALKPHOS 82  BILITOT 0.3    Microbiology: 3/24 blood cx ngtd at 24hr 3.22 blood cx viridans strep group S PCN S CTX Studies/Results: Dg Orthopantogram  Result Date: 07/05/2016 CLINICAL DATA:  Congestive heart failure. History of dental work. Denies jaw pain. EXAM: ORTHOPANTOGRAM/PANORAMIC COMPARISON:  None. FINDINGS: Several root canal procedures have been performed. Surrounding a mandibular RIGHT premolar tooth, there is a periapical lucency (arrow). Several teeth are also missing. IMPRESSION: Query periodontal disease affecting a RIGHT mandibular premolar. Electronically Signed   By: Staci Righter M.D.   On: 07/05/2016 09:07   Dg Chest 2 View  Result Date: 07/05/2016 CLINICAL DATA:  Patient with history of congestive heart failure. EXAM: CHEST  2 VIEW  COMPARISON:  Chest radiograph 07/01/2016; chest CT 07/02/2016. FINDINGS: Monitoring leads overlie the patient. Stable prominent cardiac and mediastinal contours with tortuosity of the thoracic aorta. Minimal heterogeneous opacities lung bases bilaterally. Stable biapical pleuroparenchymal thickening. Lateral view limited due to overlapping soft tissue. Thoracic spine degenerative changes. IMPRESSION: Tortuous aorta. Aortic atherosclerosis. Basilar atelectasis. Electronically Signed   By: Lovey Newcomer M.D.   On: 07/05/2016 08:12     Assessment/Plan: Viridans  strep group endocarditis = recommend panorex, will do a trial of penicillin 12MU Q 12 hr to see if he tolerates fluid volume instead of ceftriaxone. If blood cx remain negative today can get picc line tomorrow or Tuesday. Await TEE results. Current consensus is to do medical management no urgent surgical needs  Elevated troponins = trended down . Followed by cards who recommends cardiac cath prior to any surgical tx of AV  Anemia = hx of iron deficiency anema but possibly worsened of late with poor nutritional intake, and weight loss  Weight loss = thought to be due to underlying infection. He reports good appetite since nightsweats improving  Dr Megan Salon to provide further [place], [facility] for Infectious Diseases Cell: [phone] Pager: [phone]  07/05/2016, 1:04 PM

## 2016-07-06 DIAGNOSIS — Z87891 Personal history of nicotine dependence: Secondary | ICD-10-CM

## 2016-07-06 DIAGNOSIS — Z8249 Family history of ischemic heart disease and other diseases of the circulatory system: Secondary | ICD-10-CM

## 2016-07-06 DIAGNOSIS — B955 Unspecified streptococcus as the cause of diseases classified elsewhere: Secondary | ICD-10-CM

## 2016-07-06 DIAGNOSIS — D508 Other iron deficiency anemias: Secondary | ICD-10-CM

## 2016-07-06 DIAGNOSIS — I330 Acute and subacute infective endocarditis: Secondary | ICD-10-CM

## 2016-07-06 DIAGNOSIS — Z8041 Family history of malignant neoplasm of ovary: Secondary | ICD-10-CM

## 2016-07-06 DIAGNOSIS — B954 Other streptococcus as the cause of diseases classified elsewhere: Secondary | ICD-10-CM

## 2016-07-06 DIAGNOSIS — Q231 Congenital insufficiency of aortic valve: Secondary | ICD-10-CM

## 2016-07-06 DIAGNOSIS — I712 Thoracic aortic aneurysm, without rupture: Secondary | ICD-10-CM

## 2016-07-06 DIAGNOSIS — I351 Nonrheumatic aortic (valve) insufficiency: Secondary | ICD-10-CM

## 2016-07-06 LAB — BASIC METABOLIC PANEL
ANION GAP: 9 (ref 5–15)
BUN: 17 mg/dL (ref 6–20)
CHLORIDE: 106 mmol/L (ref 101–111)
CO2: 25 mmol/L (ref 22–32)
Calcium: 8.6 mg/dL — ABNORMAL LOW (ref 8.9–10.3)
Creatinine, Ser: 0.97 mg/dL (ref 0.61–1.24)
Glucose, Bld: 103 mg/dL — ABNORMAL HIGH (ref 65–99)
POTASSIUM: 4.3 mmol/L (ref 3.5–5.1)
SODIUM: 140 mmol/L (ref 135–145)

## 2016-07-06 LAB — HEMOGLOBIN A1C
Hgb A1c MFr Bld: 6 % — ABNORMAL HIGH (ref 4.8–5.6)
MEAN PLASMA GLUCOSE: 126 mg/dL

## 2016-07-06 MED ORDER — SODIUM CHLORIDE 0.9% FLUSH
10.0000 mL | Freq: Two times a day (BID) | INTRAVENOUS | Status: DC
  Administered 2016-07-06 – 2016-07-08 (×4): 10 mL
  Administered 2016-07-08: 20 mL
  Administered 2016-07-09: 10 mL

## 2016-07-06 MED ORDER — SODIUM CHLORIDE 0.9% FLUSH: 10.0000 mL | INTRAVENOUS | Status: DC | PRN

## 2016-07-06 NOTE — Progress Notes (Signed)
Peripherally Inserted Central Catheter/Midline Placement  The IV Nurse has discussed with the patient and/or persons authorized to consent for the patient, the purpose of this procedure and the potential benefits and risks involved with this procedure.  The benefits include less needle sticks, lab draws from the catheter, and the patient may be discharged home with the catheter. Risks include, but not limited to, infection, bleeding, blood clot (thrombus formation), and puncture of an artery; nerve damage and irregular heartbeat and possibility to perform a PICC exchange if needed/ordered by physician.  Alternatives to this procedure were also discussed.  Bard Power PICC patient education guide, fact sheet on infection prevention and patient information card has been provided to patient /or left at bedside.    PICC/Midline Placement Documentation  PICC Single Lumen 12/45/80 PICC Right Basilic 43 cm 0 cm (Active)  Indication for Insertion or Continuance of Line Home intravenous therapies (PICC only) 07/06/2016  9:00 PM  Exposed Catheter (cm) 0 cm 07/06/2016  9:00 PM  Dressing Change Due 07/13/16 07/06/2016  9:00 PM       Jule Economy Horton 07/06/2016, 9:16 PM

## 2016-07-06 NOTE — Consult Note (Signed)
[facility]  Date of Admission:  07/02/2016   Total days of antibiotics 5        Day to penicillin         Principal Problem:   Streptococcal endocarditis Active Problems:   Anemia   H/O cardiac murmur   Ascending aortic aneurysm (HCC)   NSTEMI (non-ST elevated myocardial infarction) (HCC)   Protein-calorie malnutrition, severe   Bicuspid aortic valve   Aortic insufficiency   Weight loss   . atorvastatin  40 mg Oral q1800  . enoxaparin (LOVENOX) injection  40 mg Subcutaneous Q24H  . feeding supplement  1 Container Oral TID BM  . feeding supplement (ENSURE ENLIVE)  237 mL Oral BID BM  . feeding supplement (PRO-STAT SUGAR FREE 64)  30 mL Oral BID  . ferrous sulfate  325 mg Oral Q breakfast  . penicillin g continuous IV infusion  12 Million Units Intravenous Q12H  . sodium chloride flush  3 mL Intravenous Q12H    SUBJECTIVE: He is beginning to feel a little bit better. He did not have night sweats last night. He has been feeling poorly for a little over 3 months.  Review of Systems: Review of Systems  Constitutional: Positive for weight loss. Negative for chills, diaphoresis, fever and malaise/fatigue.  HENT: Negative for sore throat.   Respiratory: Negative for cough, sputum production and shortness of breath.   Cardiovascular: Negative for chest pain.  Gastrointestinal: Negative for abdominal pain, diarrhea, nausea and vomiting.  Genitourinary: Negative for dysuria and frequency.  Musculoskeletal: Negative for joint pain and myalgias.  Skin: Negative for rash.  Neurological: Negative for dizziness and headaches.  Psychiatric/Behavioral: Negative for depression and substance abuse. The patient is not nervous/anxious.     Past Medical History:  Diagnosis Date  . Anemia   . Aortic insufficiency   . Ascending aortic aneurysm (HCC)    4.6 cm  . Bacteremia 07/02/2016   Streptococcus viridans   . Bicuspid aortic valve     Social  History  Substance Use Topics  . Smoking status: Former Smoker    Types: Cigarettes  . Smokeless tobacco: Never Used     Comment: Quit 30 years ago.    . Alcohol use No    Family History  Problem Relation Age of Onset  . Ovarian cancer Mother   . Heart disease Father 33    Died of coronary thrombosis.     No Known Allergies  OBJECTIVE: Vitals:   07/05/16 1400 07/05/16 2242 07/06/16 0527 07/06/16 1225  BP: (!) 132/53 (!) 119/42 (!) 132/53 (!) 124/48  Pulse: 73 84 73 77  Resp: 20   19  Temp: 97.7 F (36.5 C) 98 F (36.7 C) 98.4 F (36.9 C) 98.6 F (37 C)  TempSrc: Oral Oral Oral Oral  SpO2: 98% 96% 98% 97%  Weight:   237 lb 14.4 oz (107.9 kg)   Height:       Body mass index is 32.27 kg/m.  Physical Exam  Constitutional: He is oriented to person, place, and time.  He appears comfortable and in good spirits. He is sitting up in a chair eating lunch.  Cardiovascular: Normal rate and regular rhythm.   Murmur heard. To-and-fro systolic and diastolic murmur heard best at right upper sternal border.  Pulmonary/Chest: Effort normal and breath sounds normal. He has no wheezes. He has no rales.  Abdominal: Soft.  Musculoskeletal: Normal range of motion. He exhibits  no edema or tenderness.  Neurological: He is alert and oriented to person, place, and time.  Skin: No rash noted.  Psychiatric: Mood and affect normal.    Lab Results Lab Results  Component Value Date   WBC 8.0 07/05/2016   HGB 8.5 (L) 07/05/2016   HCT 27.4 (L) 07/05/2016   MCV 80.8 07/05/2016   PLT 246 07/05/2016    Lab Results  Component Value Date   CREATININE 0.97 07/06/2016   BUN 17 07/06/2016   NA 140 07/06/2016   K 4.3 07/06/2016   CL 106 07/06/2016   CO2 25 07/06/2016    Lab Results  Component Value Date   ALT 17 07/05/2016   AST 16 07/05/2016   ALKPHOS 82 07/05/2016   BILITOT 0.3 07/05/2016     Microbiology: Recent Results (from the past 240 hour(s))  Blood culture (routine x 2)      Status: Abnormal   Collection Time: 07/02/16 12:08 AM  Result Value Ref Range Status   Specimen Description BLOOD RIGHT HAND  Final   Special Requests BOTTLES DRAWN AEROBIC AND ANAEROBIC [place]  Final   Culture  Setup Time   Final    GRAM POSITIVE COCCI Gram Stain Report Called to,Read Back By and Verified With: MINTER,R ON 07/02/16 @1515  BY MITCHELL,S IN BOTH AEROBIC AND ANAEROBIC BOTTLES    Culture (A)  Final    VIRIDANS STREPTOCOCCUS SUSCEPTIBILITIES PERFORMED ON PREVIOUS CULTURE WITHIN THE LAST 5 DAYS. Performed at [facility], [place] [address], [place], [place] [address]    Report Status 07/04/2016 FINAL  Final  Blood culture (routine x 2)     Status: Abnormal   Collection Time: 07/02/16 12:17 AM  Result Value Ref Range Status   Specimen Description BLOOD RIGHT ARM  Final   Special Requests BOTTLES DRAWN AEROBIC AND ANAEROBIC [place]  Final   Culture  Setup Time   Final    GRAM POSITIVE COCCI Gram Stain Report Called to,Read Back By and Verified With: BECCA MINTER RN @ 9211 ON 3.22.18 BY BAYSE,L IN BOTH AEROBIC AND ANAEROBIC BOTTLES CRITICAL RESULT CALLED TO, READ BACK BY AND VERIFIED WITH: T BULLOCK,RN @0041  07/03/16 MKELLY,MLT Performed at [facility], [place] [address], [place], [place] [address]    Culture VIRIDANS STREPTOCOCCUS (A)  Final   Report Status 07/04/2016 FINAL  Final   Organism ID, Bacteria VIRIDANS STREPTOCOCCUS  Final      Susceptibility   Viridans streptococcus - MIC*    PENICILLIN <=0.06 SENSITIVE Sensitive     CEFTRIAXONE <=0.12 SENSITIVE Sensitive     ERYTHROMYCIN 2 RESISTANT Resistant     LEVOFLOXACIN 1 SENSITIVE Sensitive     VANCOMYCIN 0.5 SENSITIVE Sensitive     * VIRIDANS STREPTOCOCCUS  Blood Culture ID Panel (Reflexed)     Status: Abnormal   Collection Time: 07/02/16 12:17 AM  Result Value Ref Range Status   Enterococcus species NOT DETECTED NOT DETECTED Final   Listeria monocytogenes NOT DETECTED NOT DETECTED Final   Staphylococcus  species NOT DETECTED NOT DETECTED Final   Staphylococcus aureus NOT DETECTED NOT DETECTED Final   Streptococcus species DETECTED (A) NOT DETECTED Final    Comment: Not Enterococcus species, Streptococcus agalactiae, Streptococcus pyogenes, or Streptococcus pneumoniae. CRITICAL RESULT CALLED TO, READ BACK BY AND VERIFIED WITH: T BULLOCK,RN @0041  07/03/16 MKELLY,MLT    Streptococcus agalactiae NOT DETECTED NOT DETECTED Final   Streptococcus pneumoniae NOT DETECTED NOT DETECTED Final   Streptococcus pyogenes NOT DETECTED NOT DETECTED Final  Acinetobacter baumannii NOT DETECTED NOT DETECTED Final   Enterobacteriaceae species NOT DETECTED NOT DETECTED Final   Enterobacter cloacae complex NOT DETECTED NOT DETECTED Final   Escherichia coli NOT DETECTED NOT DETECTED Final   Klebsiella oxytoca NOT DETECTED NOT DETECTED Final   Klebsiella pneumoniae NOT DETECTED NOT DETECTED Final   Proteus species NOT DETECTED NOT DETECTED Final   Serratia marcescens NOT DETECTED NOT DETECTED Final   Haemophilus influenzae NOT DETECTED NOT DETECTED Final   Neisseria meningitidis NOT DETECTED NOT DETECTED Final   Pseudomonas aeruginosa NOT DETECTED NOT DETECTED Final   Candida albicans NOT DETECTED NOT DETECTED Final   Candida glabrata NOT DETECTED NOT DETECTED Final   Candida krusei NOT DETECTED NOT DETECTED Final   Candida parapsilosis NOT DETECTED NOT DETECTED Final   Candida tropicalis NOT DETECTED NOT DETECTED Final    Comment: Performed at [facility], [place] [address], [place], [place] [address]  Culture, blood (Routine X 2) w Reflex to ID Panel     Status: Abnormal   Collection Time: 07/02/16  4:31 PM  Result Value Ref Range Status   Specimen Description RIGHT ANTECUBITAL  Final   Special Requests BOTTLES DRAWN AEROBIC AND ANAEROBIC 10 CC EACH  Final   Culture  Setup Time   Final    GRAM POSITIVE COCCI Gram Stain Report Called to,Read Back By and Verified With: JOHNSON B. AT [place] [id number] BY  THOMPSON S. IN BOTH AEROBIC AND ANAEROBIC BOTTLES CRITICAL VALUE NOTED.  VALUE IS CONSISTENT WITH PREVIOUSLY REPORTED AND CALLED VALUE.    Culture (A)  Final    VIRIDANS STREPTOCOCCUS SUSCEPTIBILITIES PERFORMED ON PREVIOUS CULTURE WITHIN THE LAST 5 DAYS. Performed at [facility], [place] [address] [place], [place] [address]    Report Status 07/05/2016 FINAL  Final  Culture, blood (Routine X 2) w Reflex to ID Panel     Status: Abnormal   Collection Time: 07/02/16  4:34 PM  Result Value Ref Range Status   Specimen Description BLOOD RIGHT HAND  Final   Special Requests BOTTLES DRAWN AEROBIC AND ANAEROBIC 10CC EACH  Final   Culture  Setup Time   Final    GRAM POSITIVE COCCI Gram Stain Report Called to,Read Back By and Verified With: JOHNSON B. AT [place] ON [id number] BY THOMPSON S. IN BOTH AEROBIC AND ANAEROBIC BOTTLES CRITICAL VALUE NOTED.  VALUE IS CONSISTENT WITH PREVIOUSLY REPORTED AND CALLED VALUE.    Culture (A)  Final    VIRIDANS STREPTOCOCCUS SUSCEPTIBILITIES PERFORMED ON PREVIOUS CULTURE WITHIN THE LAST 5 DAYS. Performed at [facility], [place] [address], [place], [place] [address]    Report Status 07/05/2016 FINAL  Final  Culture, blood (routine x 2)     Status: None (Preliminary result)   Collection Time: 07/04/16 12:48 PM  Result Value Ref Range Status   Specimen Description BLOOD LEFT HAND  Final   Special Requests IN PEDIATRIC BOTTLE 1.5CC  Final   Culture NO GROWTH 1 DAY  Final   Report Status PENDING  Incomplete  Culture, blood (routine x 2)     Status: None (Preliminary result)   Collection Time: 07/04/16 12:55 PM  Result Value Ref Range Status   Specimen Description BLOOD LEFT HAND  Final   Special Requests IN PEDIATRIC BOTTLE 1CC  Final   Culture NO GROWTH 1 DAY  Final   Report Status PENDING  Incomplete     ASSESSMENT: 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 Repeat blood  cultures are negative at 48 hours so I will go ahead and order a PICC. Once he is closer to discharge I will change him to more convenient, once daily dosing with ceftriaxone.  PLAN: 1. Continue penicillin 2. PICC placement  Michel Bickers, [place] for [place] [phone] pager   [phone] cell 07/06/2016, 12:38 PM

## 2016-07-06 NOTE — Progress Notes (Signed)
PROGRESS NOTE        PATIENT DETAILS Name: Charles [name] Age: 71 y.o. Sex: male Date of Birth: 11-25-1945 Admit Date: 07/02/2016 Admitting Physician Bethena Roys, MD KCL:EXNT [place] Crane, MD  Brief Narrative: Patient is a 71 y.o. 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 See below for further details.  Subjective: No chest pain or shortness of breath. Lying comfortably in bed.  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 Cardiology planning on TEE if and now scheduled for 3/27.  ID/CTVS following. Orthopantogram-shows possible periodontal disease affecting the right mandbular pre-have left a message for Dr. Ritta Slot office.   Elevated Troponin:?etiology-does have hx of occasional chest pain with mostly atypical features, however does have hypokinesis of apical inferior wall. Given his of AS-likely that he could have underlying CAD or could have septic embolic to his coronaries. Cardiology following  Anemia: probably secondary to acute illness-but apparently has Fe def anemia at baseline. Restart Fe supplementation. Per patient he has not had a screening colonoscopy-he will need to do in the outpatient setting.  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  Antimicrobial agents: Anti-infectives    Start     Dose/Rate Route Frequency Ordered Stop   07/05/16 1400  penicillin G potassium 12 Million Units in dextrose 5 % 500 mL continuous infusion     12 Million Units 41.7 mL/hr over 12 Hours Intravenous Every 12 hours 07/05/16 1312     07/03/16 1800  cefTRIAXone (ROCEPHIN) 2 g in dextrose 5 % 50 mL IVPB  Status:  Discontinued     2 g 100 mL/hr over 30 Minutes Intravenous Every 24 hours 07/03/16 1711 07/05/16 1312   07/03/16 0800  vancomycin (VANCOCIN) IVPB 750 mg/150 ml premix  Status:  Discontinued     750 mg 150 mL/hr over 60 Minutes Intravenous Every 12 hours 07/02/16 1953 07/03/16 1711   07/03/16 0000  piperacillin-tazobactam (ZOSYN) IVPB 3.375 g  Status:  Discontinued     3.375 g 12.5 mL/hr over 240 Minutes Intravenous Every 8 hours 07/02/16 1953 07/03/16 1205   07/02/16 2000  vancomycin (VANCOCIN) IVPB 1000 mg/200 mL premix     1,000 mg 200 mL/hr over 60 Minutes Intravenous  Once 07/02/16 1953 07/02/16 2232   07/02/16 1630  piperacillin-tazobactam (ZOSYN) IVPB 3.375 g     3.375 g 100 mL/hr over 30 Minutes Intravenous  Once 07/02/16 1623 07/02/16 1745   07/02/16 1630  vancomycin (VANCOCIN) IVPB 1000 mg/200 mL premix     1,000 mg 200 mL/hr over 60 Minutes Intravenous  Once 07/02/16 1623 07/02/16 1817      Procedures: Echo   CONSULTS:  cardiology   ID  Time spent: 25-minutes-Greater than 50% of this time  was spent in counseling, explanation of diagnosis, planning of further management, and coordination of care.  MEDICATIONS: Scheduled Meds: . atorvastatin  40 mg Oral q1800  . enoxaparin (LOVENOX) injection  40 mg Subcutaneous Q24H  . feeding supplement  1 Container Oral TID BM  . feeding supplement (ENSURE ENLIVE)  237 mL Oral BID BM  . feeding supplement (PRO-STAT SUGAR FREE 64)  30 mL Oral BID  . ferrous sulfate   325 mg Oral Q breakfast  . penicillin g continuous IV infusion  12 Million Units Intravenous Q12H  . sodium chloride flush  3 mL Intravenous Q12H   Continuous Infusions: PRN Meds:.acetaminophen **OR** acetaminophen, ondansetron **OR** ondansetron (ZOFRAN) IV, senna-docusate   PHYSICAL EXAM: Vital signs: Vitals:   07/05/16 0526 07/05/16 1400 07/05/16 2242 07/06/16 0527  BP: (!) 128/55 (!) 132/53 (!) 119/42 (!) 132/53  Pulse: 69 73 84 73  Resp: 17 20    Temp: 99.1 F (37.3 C) 97.7 F (36.5 C) 98 F (36.7 C) 98.4 F (36.9 C)  TempSrc: Oral Oral Oral Oral  SpO2: 95% 98% 96% 98%  Weight: 97.8 kg (215 lb 9.6 oz)   107.9 kg (237 lb 14.4 oz)  Height:       Filed Weights   07/04/16 0200 07/05/16 0526 07/06/16 0527  Weight: 106.9 kg (235 lb 10.8 oz) 97.8 kg (215 lb 9.6 oz) 107.9 kg (237 lb 14.4 oz)   Body mass index is 32.27 kg/m.   General appearance :Awake, alert, not in any distress. Lying comfortably in bed.  Eyes:, pupils equally reactive to light and accomodation HEENT: Atraumatic and Normocephalic Neck: supple, no JVD. No cervical lymphadenopathy. Resp:Good air entry bilaterally, no rales CVS: S1 S2 regular, + syst murmur GI: Bowel sounds present, Non tender and not distended with no gaurding, rigidity or rebound Extremities: B/L Lower Ext shows no edema, both legs are warm to touch Neurology:  speech clear,Non focal, sensation is grossly intact. Psychiatric: Normal judgment and insight. Alert and oriented x 3.  Musculoskeletal:No digital cyanosis Skin:No Rash, warm and dry Wounds:N/A  I have personally reviewed following labs and imaging studies  LABORATORY DATA: CBC:  Recent Labs Lab 07/01/16 2017 07/02/16 1744 07/03/16 0715 07/05/16 0300  WBC 16.0*  --  12.9* 8.0  NEUTROABS 13.8*  --   --   --   HGB 10.2* 9.9* 8.5* 8.5*  HCT 32.0* 29.0* 27.1* 27.4*  MCV 80.4  --  79.2 80.8  PLT 299  --  237 614    Basic Metabolic Panel:  Recent Labs Lab  07/01/16 2017 07/01/16 2118 07/02/16 1744 07/03/16 0715 07/05/16 0300 07/06/16 0502  NA 134*  --  138 136 138 140  K 3.9  --  4.1 3.9 4.2 4.3  CL 101  --  102 106 106 106  CO2 24  --   --  24 23 25   GLUCOSE 148*  --  162* 123* 101* 103*  BUN 27*  --  33* 23* 19 17  CREATININE 1.37*  --  1.20 1.19 1.02 0.97  CALCIUM 8.5*  --   --  8.0* 8.3* 8.6*  MG  --  2.3  --   --   --   --     GFR: Estimated Creatinine Clearance: 92.5 mL/min (by C-G formula based on SCr of 0.97 mg/dL).  Liver Function Tests:  Recent Labs Lab 07/01/16 2118 07/05/16 0300  AST 21 16  ALT 17 17  ALKPHOS 126 82  BILITOT 0.7 0.3  PROT 7.6 6.2*  ALBUMIN 3.0* 2.2*   No results for input(s): LIPASE, AMYLASE in the last 168 hours. No results for input(s): AMMONIA in the last 168 hours.  Coagulation Profile: No results for input(s): INR, PROTIME in the last 168 hours.  Cardiac Enzymes:  Recent Labs Lab 07/02/16 1954 07/03/16 0117 07/03/16 0715  TROPONINI 0.89* 1.01* 0.78*    BNP (last 3 results) No results for input(s): PROBNP in the last 8760 hours.  HbA1C:  Recent Labs  07/05/16 0300  HGBA1C 6.0*    CBG: No results for input(s): GLUCAP in the last 168 hours.  Lipid Profile: No results for input(s): CHOL, HDL, LDLCALC, TRIG, CHOLHDL, LDLDIRECT in the last 72 hours.  Thyroid Function Tests: No results for input(s): TSH, T4TOTAL, FREET4, T3FREE, THYROIDAB in the last 72 hours.  Anemia Panel:  Recent Labs  07/04/16 1248  VITAMINB12 636  FOLATE 27.6  FERRITIN 411*  TIBC 234*  IRON 27*  RETICCTPCT 1.5    Urine analysis:    Component Value Date/Time   COLORURINE AMBER (A) 07/01/2016 2304   APPEARANCEUR HAZY (A) 07/01/2016 2304   LABSPEC 1.026 07/01/2016 2304   PHURINE 5.0 07/01/2016 2304   GLUCOSEU NEGATIVE 07/01/2016 2304   HGBUR MODERATE (A) 07/01/2016 2304   BILIRUBINUR NEGATIVE 07/01/2016 2304   KETONESUR 5 (A) 07/01/2016 2304   PROTEINUR 100 (A) 07/01/2016 2304    NITRITE NEGATIVE 07/01/2016 2304   LEUKOCYTESUR NEGATIVE 07/01/2016 2304    Sepsis Labs: Lactic Acid, Venous    Component Value Date/Time   LATICACIDVEN 1.21 07/02/2016 1744    MICROBIOLOGY: Recent Results (from the past 240 hour(s))  Blood culture (routine x 2)     Status: Abnormal   Collection Time: 07/02/16 12:08 AM  Result Value Ref Range Status   Specimen Description BLOOD RIGHT HAND  Final   Special Requests BOTTLES DRAWN AEROBIC AND ANAEROBIC [place]  Final   Culture  Setup Time   Final    GRAM POSITIVE COCCI Gram Stain Report Called to,Read Back By and Verified With: MINTER,R ON 07/02/16 @1515  BY MITCHELL,S IN BOTH AEROBIC AND ANAEROBIC BOTTLES    Culture (A)  Final    VIRIDANS STREPTOCOCCUS SUSCEPTIBILITIES PERFORMED ON PREVIOUS CULTURE WITHIN THE LAST 5 DAYS. Performed at [facility], [place] [address] [place], [place] [address]    Report Status 07/04/2016 FINAL  Final  Blood culture (routine x 2)     Status: Abnormal   Collection Time: 07/02/16 12:17 AM  Result Value Ref Range Status   Specimen Description BLOOD RIGHT ARM  Final   Special Requests BOTTLES DRAWN AEROBIC AND ANAEROBIC [place]  Final   Culture  Setup Time   Final    GRAM POSITIVE COCCI Gram Stain Report Called to,Read Back By and Verified With: BECCA MINTER RN @ 5277 ON 3.22.18 BY BAYSE,L IN BOTH AEROBIC AND ANAEROBIC BOTTLES CRITICAL RESULT CALLED TO, READ BACK BY AND VERIFIED WITH: T BULLOCK,RN @0041  07/03/16 MKELLY,MLT Performed at [facility], [place] [address], [place], [place] [address]    Culture VIRIDANS STREPTOCOCCUS (A)  Final   Report Status 07/04/2016 FINAL  Final   Organism ID, Bacteria VIRIDANS STREPTOCOCCUS  Final      Susceptibility   Viridans streptococcus - MIC*    PENICILLIN <=0.06 SENSITIVE Sensitive     CEFTRIAXONE <=0.12 SENSITIVE Sensitive     ERYTHROMYCIN 2 RESISTANT Resistant     LEVOFLOXACIN 1 SENSITIVE Sensitive     VANCOMYCIN 0.5 SENSITIVE Sensitive     *  VIRIDANS  STREPTOCOCCUS  Blood Culture ID Panel (Reflexed)     Status: Abnormal   Collection Time: 07/02/16 12:17 AM  Result Value Ref Range Status   Enterococcus species NOT DETECTED NOT DETECTED Final   Listeria monocytogenes NOT DETECTED NOT DETECTED Final   Staphylococcus species NOT DETECTED NOT DETECTED Final   Staphylococcus aureus NOT DETECTED NOT DETECTED Final   Streptococcus species DETECTED (A) NOT DETECTED Final    Comment: Not Enterococcus species, Streptococcus agalactiae, Streptococcus pyogenes, or Streptococcus pneumoniae. CRITICAL RESULT CALLED TO, READ BACK BY AND VERIFIED WITH: T BULLOCK,RN @0041  07/03/16 MKELLY,MLT    Streptococcus agalactiae NOT DETECTED NOT DETECTED Final   Streptococcus pneumoniae NOT DETECTED NOT DETECTED Final   Streptococcus pyogenes NOT DETECTED NOT DETECTED Final   Acinetobacter baumannii NOT DETECTED NOT DETECTED Final   Enterobacteriaceae species NOT DETECTED NOT DETECTED Final   Enterobacter cloacae complex NOT DETECTED NOT DETECTED Final   Escherichia coli NOT DETECTED NOT DETECTED Final   Klebsiella oxytoca NOT DETECTED NOT DETECTED Final   Klebsiella pneumoniae NOT DETECTED NOT DETECTED Final   Proteus species NOT DETECTED NOT DETECTED Final   Serratia marcescens NOT DETECTED NOT DETECTED Final   Haemophilus influenzae NOT DETECTED NOT DETECTED Final   Neisseria meningitidis NOT DETECTED NOT DETECTED Final   Pseudomonas aeruginosa NOT DETECTED NOT DETECTED Final   Candida albicans NOT DETECTED NOT DETECTED Final   Candida glabrata NOT DETECTED NOT DETECTED Final   Candida krusei NOT DETECTED NOT DETECTED Final   Candida parapsilosis NOT DETECTED NOT DETECTED Final   Candida tropicalis NOT DETECTED NOT DETECTED Final    Comment: Performed at [facility], [place] [address] [place], [place] [address]  Culture, blood (Routine X 2) w Reflex to ID Panel     Status: Abnormal   Collection Time: 07/02/16  4:31 PM  Result Value Ref Range Status     Specimen Description RIGHT ANTECUBITAL  Final   Special Requests BOTTLES DRAWN AEROBIC AND ANAEROBIC 10 CC EACH  Final   Culture  Setup Time   Final    GRAM POSITIVE COCCI Gram Stain Report Called to,Read Back By and Verified With: JOHNSON B. AT [place] ON [id number] BY THOMPSON S. IN BOTH AEROBIC AND ANAEROBIC BOTTLES CRITICAL VALUE NOTED.  VALUE IS CONSISTENT WITH PREVIOUSLY REPORTED AND CALLED VALUE.    Culture (A)  Final    VIRIDANS STREPTOCOCCUS SUSCEPTIBILITIES PERFORMED ON PREVIOUS CULTURE WITHIN THE LAST 5 DAYS. Performed at [facility], [place] [address] [place], [place] [address]    Report Status 07/05/2016 FINAL  Final  Culture, blood (Routine X 2) w Reflex to ID Panel     Status: Abnormal   Collection Time: 07/02/16  4:34 PM  Result Value Ref Range Status   Specimen Description BLOOD RIGHT HAND  Final   Special Requests BOTTLES DRAWN AEROBIC AND ANAEROBIC 10CC EACH  Final   Culture  Setup Time   Final    GRAM POSITIVE COCCI Gram Stain Report Called to,Read Back By and Verified With: JOHNSON B. AT [place] ON [id number] BY THOMPSON S. IN BOTH AEROBIC AND ANAEROBIC BOTTLES CRITICAL VALUE NOTED.  VALUE IS CONSISTENT WITH PREVIOUSLY REPORTED AND CALLED VALUE.    Culture (A)  Final    VIRIDANS STREPTOCOCCUS SUSCEPTIBILITIES PERFORMED ON PREVIOUS CULTURE WITHIN THE LAST 5 DAYS. Performed at [facility], [place] [address], [place], [place] [address]    Report Status 07/05/2016 FINAL  Final  Culture, blood (routine x 2)  Status: None (Preliminary result)   Collection Time: 07/04/16 12:48 PM  Result Value Ref Range Status   Specimen Description BLOOD LEFT HAND  Final   Special Requests IN PEDIATRIC BOTTLE 1.5CC  Final   Culture NO GROWTH 1 DAY  Final   Report Status PENDING  Incomplete  Culture, blood (routine x 2)     Status: None (Preliminary result)   Collection Time: 07/04/16 12:55 PM  Result Value Ref Range Status   Specimen Description BLOOD LEFT HAND  Final    Special Requests IN PEDIATRIC BOTTLE 1CC  Final   Culture NO GROWTH 1 DAY  Final   Report Status PENDING  Incomplete    RADIOLOGY STUDIES/RESULTS: Dg Orthopantogram  Result Date: 07/05/2016 CLINICAL DATA:  Congestive heart failure. History of dental work. Denies jaw pain. EXAM: ORTHOPANTOGRAM/PANORAMIC COMPARISON:  None. FINDINGS: Several root canal procedures have been performed. Surrounding a mandibular RIGHT premolar tooth, there is a periapical lucency (arrow). Several teeth are also missing. IMPRESSION: Query periodontal disease affecting a RIGHT mandibular premolar. Electronically Signed   By: Staci Righter M.D.   On: 07/05/2016 09:07   Dg Chest 2 View  Result Date: 07/05/2016 CLINICAL DATA:  Patient with history of congestive heart failure. EXAM: CHEST  2 VIEW COMPARISON:  Chest radiograph 07/01/2016; chest CT 07/02/2016. FINDINGS: Monitoring leads overlie the patient. Stable prominent cardiac and mediastinal contours with tortuosity of the thoracic aorta. Minimal heterogeneous opacities lung bases bilaterally. Stable biapical pleuroparenchymal thickening. Lateral view limited due to overlapping soft tissue. Thoracic spine degenerative changes. IMPRESSION: Tortuous aorta. Aortic atherosclerosis. Basilar atelectasis. Electronically Signed   By: Lovey Newcomer M.D.   On: 07/05/2016 08:12   Dg Chest 2 View  Result Date: 07/01/2016 CLINICAL DATA:  71 year old male with night sweats and weight loss. EXAM: CHEST  2 VIEW COMPARISON:  Chest radiograph dated 05/01/2016 FINDINGS: The lungs are clear. There is no pleural effusion or pneumothorax. The cardiac silhouette is within normal limits. The aorta is tortuous. There is prominence of the aortic root on the lateral projection which may be the artifactual and related to tortuosity. Aneurysmal dilatation of the ascending aorta or aortic root is not excluded. CT of the chest with contrast may provide better evaluation. No acute osseous pathology  identified. IMPRESSION: 1. No acute cardiopulmonary process. 2. Tortuous aorta with dilated appearance of the ascending aorta/aortic root. CT with contrast may provide better evaluation. Electronically Signed   By: Anner Crete M.D.   On: 07/01/2016 23:21   Ct Head Wo Contrast  Result Date: 07/01/2016 CLINICAL DATA:  Fall in December not feeling well EXAM: CT HEAD WITHOUT CONTRAST TECHNIQUE: Contiguous axial images were obtained from the base of the skull through the vertex without intravenous contrast. COMPARISON:  None. FINDINGS: Brain: No acute territorial infarction, intracranial hemorrhage or focal mass lesion is visualized. The ventricles are nonenlarged. Mild atrophy. Vascular: No hyperdense vessels. Scattered calcifications at the carotid siphons. Skull: Normal. Negative for fracture or focal lesion. Sinuses/Orbits: Minimal mucosal thickening in the ethmoid sinuses. Prior maxillary antrostomies. Tiny mucous retention cysts or small polyps in the maxillary sinuses. Other: None IMPRESSION: No CT evidence for acute intracranial abnormality. Electronically Signed   By: Donavan Foil M.D.   On: 07/01/2016 23:03   Ct Chest W Contrast  Result Date: 07/02/2016 CLINICAL DATA:  71 year old male with weight loss, night sweats, and abnormal chest x-ray. EXAM: CT CHEST WITH CONTRAST TECHNIQUE: Multidetector CT imaging of the chest was performed during intravenous contrast administration. CONTRAST:  52mL ISOVUE-300 IOPAMIDOL (ISOVUE-300) INJECTION 61% COMPARISON:  Chest radiograph dated 07/01/2016 FINDINGS: Cardiovascular: Top-normal cardiac size. No pericardial effusion. The thoracic aorta is tortuous. There is mild dilatation of the ascending aorta measuring up to 4.4 cm in diameter. No aortic dissection. The origins of the great vessels of the aortic arch appear patent. The central pulmonary arteries are grossly unremarkable. Evaluation of the pulmonary arteries is limited due to suboptimal opacification  and timing of the contrast. Mediastinum/Nodes: There is no hilar or mediastinal adenopathy. The esophagus and the thyroid gland are grossly unremarkable. Lungs/Pleura: There is mild elevation of the left hemidiaphragm with linear left lung base atelectasis/ scarring. Pneumonia is less likely. There is no pleural effusion or pneumothorax. The central airways are patent. Upper Abdomen: There is apparent fatty infiltration of the liver. A 2.1 cm hypodense lesion in the caudate lobe is not well characterized but may represent a cyst or hemangioma. There is a 2.5 cm indeterminate right adrenal hypodense nodule, most likely an adenoma. The visualized upper abdomen is otherwise unremarkable. Musculoskeletal: Mild degenerative changes of spine. No acute osseous pathology. IMPRESSION: 1. No acute intrathoracic pathology. 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 Recommend annual imaging followup by CTA or MRA. This recommendation follows 2010 ACCF/AHA/AATS/ACR/ASA/SCA/SCAI/SIR/STS/SVM Guidelines for the Diagnosis and Management of Patients with Thoracic Aortic Disease. Circulation. 2010; 121: S239-R320 2. Fatty liver. A small hypodense lesion in the caudate lobe is indeterminate but likely represents a cyst or hemangioma. 3. Probable right adrenal adenoma. Electronically Signed   By: Anner Crete M.D.   On: 07/02/2016 01:14     LOS: 4 days   Oren Binet, MD  [facility] Pager:[phone]  If 7PM-7AM, please contact night-coverage [email or web address] Password [facility] 07/06/2016, 10:46 AM

## 2016-07-06 NOTE — Progress Notes (Signed)
Progress Note  Patient Name: Charles [name] Date of Encounter: 07/06/2016  Primary Cardiologist: Hochrein/new  Subjective   Feels well today. No fever, chills, SOB, chest pain.  Inpatient Medications    Scheduled Meds: . atorvastatin  40 mg Oral q1800  . enoxaparin (LOVENOX) injection  40 mg Subcutaneous Q24H  . feeding supplement  1 Container Oral TID BM  . feeding supplement (ENSURE ENLIVE)  237 mL Oral BID BM  . feeding supplement (PRO-STAT SUGAR FREE 64)  30 mL Oral BID  . ferrous sulfate  325 mg Oral Q breakfast  . penicillin g continuous IV infusion  12 Million Units Intravenous Q12H  . sodium chloride flush  3 mL Intravenous Q12H   Continuous Infusions:  PRN Meds: acetaminophen **OR** acetaminophen, ondansetron **OR** ondansetron (ZOFRAN) IV, senna-docusate   Vital Signs    Vitals:   07/05/16 0526 07/05/16 1400 07/05/16 2242 07/06/16 0527  BP: (!) 128/55 (!) 132/53 (!) 119/42 (!) 132/53  Pulse: 69 73 84 73  Resp: 17 20    Temp: 99.1 F (37.3 C) 97.7 F (36.5 C) 98 F (36.7 C) 98.4 F (36.9 C)  TempSrc: Oral Oral Oral Oral  SpO2: 95% 98% 96% 98%  Weight: 215 lb 9.6 oz (97.8 kg)   237 lb 14.4 oz (107.9 kg)  Height:        Intake/Output Summary (Last 24 hours) at 07/06/16 1140 Last data filed at 07/06/16 5427  Gross per 24 hour  Intake           1100.4 ml  Output             1450 ml  Net           -349.6 ml   Filed Weights   07/04/16 0200 07/05/16 0526 07/06/16 0527  Weight: 235 lb 10.8 oz (106.9 kg) 215 lb 9.6 oz (97.8 kg) 237 lb 14.4 oz (107.9 kg)    Telemetry    NSR with rare blocked PAC, rare PVC - Personally Reviewed  ECG    NSR with LVH - Personally Reviewed  Physical Exam   GEN: No acute distress.   Neck: No JVD Cardiac: RRR, 2/6 apical systolic murmur. 2/6 diastolic murmur at the LSB.  Respiratory: Clear to auscultation bilaterally. GI: Soft, nontender, non-distended  MS: No edema; No deformity. Neuro:  Nonfocal  Psych: Normal  affect   Labs    Chemistry Recent Labs Lab 07/01/16 2118  07/03/16 0715 07/05/16 0300 07/06/16 0502  NA  --   < > 136 138 140  K  --   < > 3.9 4.2 4.3  CL  --   < > 106 106 106  CO2  --   --  24 23 25   GLUCOSE  --   < > 123* 101* 103*  BUN  --   < > 23* 19 17  CREATININE  --   < > 1.19 1.02 0.97  CALCIUM  --   --  8.0* 8.3* 8.6*  PROT 7.6  --   --  6.2*  --   ALBUMIN 3.0*  --   --  2.2*  --   AST 21  --   --  16  --   ALT 17  --   --  17  --   ALKPHOS 126  --   --  82  --   BILITOT 0.7  --   --  0.3  --   GFRNONAA  --   --  >60 >  60 >60  GFRAA  --   --  >60 >60 >60  ANIONGAP  --   --  6 9 9   < > = values in this interval not displayed.   Hematology Recent Labs Lab 07/01/16 2017 07/02/16 1744 07/03/16 0715 07/04/16 1248 07/05/16 0300  WBC 16.0*  --  12.9*  --  8.0  RBC 3.98*  --  3.42* 3.79* 3.39*  HGB 10.2* 9.9* 8.5*  --  8.5*  HCT 32.0* 29.0* 27.1*  --  27.4*  MCV 80.4  --  79.2  --  80.8  MCH 25.6*  --  24.9*  --  25.1*  MCHC 31.9  --  31.4  --  31.0  RDW 17.0*  --  16.8*  --  17.2*  PLT 299  --  237  --  246    Cardiac Enzymes Recent Labs Lab 07/02/16 1954 07/03/16 0117 07/03/16 0715  TROPONINI 0.89* 1.01* 0.78*   No results for input(s): TROPIPOC in the last 168 hours.   BNPNo results for input(s): BNP, PROBNP in the last 168 hours.   DDimer No results for input(s): DDIMER in the last 168 hours.   Radiology    Dg Orthopantogram  Result Date: 07/05/2016 CLINICAL DATA:  Congestive heart failure. History of dental work. Denies jaw pain. EXAM: ORTHOPANTOGRAM/PANORAMIC COMPARISON:  None. FINDINGS: Several root canal procedures have been performed. Surrounding a mandibular RIGHT premolar tooth, there is a periapical lucency (arrow). Several teeth are also missing. IMPRESSION: Query periodontal disease affecting a RIGHT mandibular premolar. Electronically Signed   By: Staci Righter M.D.   On: 07/05/2016 09:07   Dg Chest 2 View  Result Date:  07/05/2016 CLINICAL DATA:  Patient with history of congestive heart failure. EXAM: CHEST  2 VIEW COMPARISON:  Chest radiograph 07/01/2016; chest CT 07/02/2016. FINDINGS: Monitoring leads overlie the patient. Stable prominent cardiac and mediastinal contours with tortuosity of the thoracic aorta. Minimal heterogeneous opacities lung bases bilaterally. Stable biapical pleuroparenchymal thickening. Lateral view limited due to overlapping soft tissue. Thoracic spine degenerative changes. IMPRESSION: Tortuous aorta. Aortic atherosclerosis. Basilar atelectasis. Electronically Signed   By: Lovey Newcomer M.D.   On: 07/05/2016 08:12    Cardiac Studies   Echo: 07/03/16: Study Conclusions  - Left ventricle: The cavity size was mildly dilated. Wall   thickness was increased in a pattern of mild LVH. Systolic   function was normal. The estimated ejection fraction was 55%.   Probable hypokinesis of the apicalinferior myocardium. Doppler   parameters are consistent with abnormal left ventricular   relaxation (grade 1 diastolic dysfunction). Doppler parameters   are consistent with high ventricular filling pressure. - Aortic valve: Bicuspid; moderately thickened leaflets.   Intermittently noted small echodensities seen near leaflet tips   in the parasternal views, although not well in other views.   Suspicious for vegetations particularly in light of bacteremia   although TEE is indicated for further assessment, and also to   exclude any associated abscess in light of fairly peripheral   aortic regurgitation. There was moderate regurgitation. Mean   gradient (S): 5 mm Hg. Peak gradient (S): 8 mm Hg. Regurgitation   pressure half-time: 383 ms. - Aorta: Aortic root dimension: 50 mm (ED). - Aortic root: The aortic root was moderately dilated. - Mitral valve: Calcified annulus. There was mild regurgitation. - Right atrium: Echodensity noted near the septum and tricuspid   valve plane, best seen in the apical  views but not in the   subcostal views.  Suspect artifact or perhaps visualization of   compressed free wall, although in light of bacteremia would   suggest further imaging with TEE to exclude other pathology.   Central venous pressure (est): 8 mm Hg. - Tricuspid valve: There was trivial regurgitation. - Pulmonary arteries: PA peak pressure: 28 mm Hg (S). - Pericardium, extracardiac: There was no pericardial effusion.  Impressions:  - Mildly dilated left ventricle with mild LVH and LVEF   approximately 55%. Probable hypokinesis of the apical inferior   wall. Grade 1 diastolic dysfunction with increased filling   pressures. Calcified mitral annulus with mild mitral   regurgitation. Aortic valve is bicuspid and moderately thickened.   Intermittently noted small echodensities seen near leaflet tips   in the parasternal views, although not well in other views.   Suspicious for vegetations particularly in light of bacteremia   although TEE is indicated for further assessment, and also to   exclude any associated abscess in light of fairly peripheral   aortic regurgitation. Aortic root is moderately dilated, measures   up to 50 mm. Trivial tricuspid regurgitation with PASP estimated   28 mmHg. Echodensity noted in right atrium near the septum and   tricuspid valve plane, best seen in the apical views but not in   the subcostal views. Suspect artifact or perhaps visualization of   compressed free wall, although in light of bacteremia would   suggest further imaging with TEE to exclude other pathology.   Results discussed with Dr. Jerilee Hoh.  Patient Profile     71 y.o. male with night sweats and weight loss.  He was at [facility].  Cultures were drawn and he was sent home but was called back when his cultures became positive.  He was found to have strep Viridans bacteremia.  He has a history of a heart murmur.   Assessment & Plan    ENDOCARDITIS:  Meets major/minor criteria for SBE.    Eccentric AI.  Plan TEE tomorrow- Tuesday.  ID and CT surgery on board.   Continue current antibiotics. Repeat blood cultures negative.  Thus far he does not have indicators for early surgical intervention.  no acute CHF, AV block or evidence of peripheral embolization.  BICUSPID AORTIC VALVE:  moderate AI. Await TEE findings.   ELEVATED TROPONIN:   No acute ST changes on EKG.  Troponin trend is flat.  Will need cardiac cath prior to any surgical treatment of his bicuspid valve.  This will follow work up and treatment as above.  No urgent indication for cath.    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  Related to bicuspid valve.     Signed, Makya Yurko [place], MD  07/06/2016, 11:40 AM

## 2016-07-06 NOTE — Care Management Important Message (Signed)
Important Message  Patient Details  Name: Charles [name] MRN: [id number] Date of Birth: [birth year]   Medicare Important Message Given:  Yes    Nathen May 07/06/2016, 4:07 PM

## 2016-07-07 ENCOUNTER — Inpatient Hospital Stay (HOSPITAL_COMMUNITY): Payer: Medicare Other

## 2016-07-07 ENCOUNTER — Encounter (HOSPITAL_COMMUNITY)
Admission: EM | Disposition: A | Discharge status: Home Health | Payer: Self-pay | Source: Home / Self Care | Attending: Internal Medicine

## 2016-07-07 ENCOUNTER — Encounter (HOSPITAL_COMMUNITY): Payer: Self-pay | Admitting: Dentistry

## 2016-07-07 DIAGNOSIS — I214 Non-ST elevation (NSTEMI) myocardial infarction: Secondary | ICD-10-CM

## 2016-07-07 DIAGNOSIS — I350 Nonrheumatic aortic (valve) stenosis: Secondary | ICD-10-CM

## 2016-07-07 DIAGNOSIS — I340 Nonrheumatic mitral (valve) insufficiency: Secondary | ICD-10-CM

## 2016-07-07 DIAGNOSIS — Z01810 Encounter for preprocedural cardiovascular examination: Secondary | ICD-10-CM

## 2016-07-07 HISTORY — PX: TEE WITHOUT CARDIOVERSION: SHX5443

## 2016-07-07 LAB — CBC
HCT: 26.7 % — ABNORMAL LOW (ref 39.0–52.0)
HEMOGLOBIN: 8.3 g/dL — AB (ref 13.0–17.0)
MCH: 25.1 pg — ABNORMAL LOW (ref 26.0–34.0)
MCHC: 31.1 g/dL (ref 30.0–36.0)
MCV: 80.7 fL (ref 78.0–100.0)
Platelets: 252 10*3/uL (ref 150–400)
RBC: 3.31 MIL/uL — AB (ref 4.22–5.81)
RDW: 17.4 % — ABNORMAL HIGH (ref 11.5–15.5)
WBC: 8.2 10*3/uL (ref 4.0–10.5)

## 2016-07-07 LAB — BASIC METABOLIC PANEL
Anion gap: 8 (ref 5–15)
BUN: 14 mg/dL (ref 6–20)
CALCIUM: 8.3 mg/dL — AB (ref 8.9–10.3)
CO2: 25 mmol/L (ref 22–32)
Chloride: 104 mmol/L (ref 101–111)
Creatinine, Ser: 0.91 mg/dL (ref 0.61–1.24)
Glucose, Bld: 100 mg/dL — ABNORMAL HIGH (ref 65–99)
Potassium: 3.9 mmol/L (ref 3.5–5.1)
Sodium: 137 mmol/L (ref 135–145)

## 2016-07-07 LAB — MAGNESIUM: MAGNESIUM: 2 mg/dL (ref 1.7–2.4)

## 2016-07-07 SURGERY — ECHOCARDIOGRAM, TRANSESOPHAGEAL
Anesthesia: Moderate Sedation

## 2016-07-07 MED ORDER — BUTAMBEN-TETRACAINE-BENZOCAINE 2-2-14 % EX AERO
INHALATION_SPRAY | CUTANEOUS | Status: DC | PRN
Start: 2016-07-07 — End: 2016-07-07
  Administered 2016-07-07: 2 via TOPICAL

## 2016-07-07 MED ORDER — MIDAZOLAM HCL 5 MG/ML IJ SOLN
INTRAMUSCULAR | Status: AC
  Filled 2016-07-07: qty 2

## 2016-07-07 MED ORDER — FENTANYL CITRATE (PF) 100 MCG/2ML IJ SOLN
INTRAMUSCULAR | Status: AC
  Filled 2016-07-07: qty 2

## 2016-07-07 MED ORDER — SODIUM CHLORIDE 0.9 % IV SOLN
INTRAVENOUS | Status: DC
  Administered 2016-07-07: 12:00:00 via INTRAVENOUS

## 2016-07-07 MED ORDER — FENTANYL CITRATE (PF) 100 MCG/2ML IJ SOLN
INTRAMUSCULAR | Status: DC | PRN
  Administered 2016-07-07: 50 ug via INTRAVENOUS

## 2016-07-07 MED ORDER — MIDAZOLAM HCL 10 MG/2ML IJ SOLN
INTRAMUSCULAR | Status: DC | PRN
  Administered 2016-07-07: 2 mg via INTRAVENOUS

## 2016-07-07 NOTE — Interval H&P Note (Signed)
History and Physical Interval Note:  07/07/2016 12:34 PM  Charles [name]  has presented today for surgery, with the diagnosis of BACTEREMIA  The various methods of treatment have been discussed with the patient and family. After consideration of risks, benefits and other options for treatment, the patient has consented to  Procedure(s): TRANSESOPHAGEAL ECHOCARDIOGRAM (TEE) (N/A) as a surgical intervention .  The patient's history has been reviewed, patient examined, no change in status, stable for surgery.  I have reviewed the patient's chart and labs.  Questions were answered to the patient's satisfaction.     [name] [employer]

## 2016-07-07 NOTE — Plan of Care (Signed)
Problem: Activity: Goal: Risk for activity intolerance will decrease Outcome: Progressing Patient to and from bathroom denies shortness of breath with that activity.  Patient denying shortness of breath at rest.

## 2016-07-07 NOTE — H&P (View-Only) (Signed)
Progress Note  Patient Name: Charles [name] Date of Encounter: 07/07/2016  Primary Cardiologist: New to Dr. Percival Spanish   Subjective   Sleepy today. No chest pain or dyspnea.   Inpatient Medications    Scheduled Meds: . atorvastatin  40 mg Oral q1800  . enoxaparin (LOVENOX) injection  40 mg Subcutaneous Q24H  . feeding supplement  1 Container Oral TID BM  . feeding supplement (ENSURE ENLIVE)  237 mL Oral BID BM  . feeding supplement (PRO-STAT SUGAR FREE 64)  30 mL Oral BID  . ferrous sulfate  325 mg Oral Q breakfast  . penicillin g continuous IV infusion  12 Million Units Intravenous Q12H  . sodium chloride flush  10-40 mL Intracatheter Q12H  . sodium chloride flush  3 mL Intravenous Q12H   Continuous Infusions:  PRN Meds: acetaminophen **OR** acetaminophen, ondansetron **OR** ondansetron (ZOFRAN) IV, senna-docusate, sodium chloride flush   Vital Signs    Vitals:   07/06/16 2141 07/07/16 0444 07/07/16 0446 07/07/16 0455  BP: 137/70  133/67   Pulse: 74  75   Resp: 20  18   Temp: 98.4 F (36.9 C) 98.5 F (36.9 C)    TempSrc: Axillary Oral    SpO2: 96%  96%   Weight:    237 lb 1.6 oz (107.5 kg)  Height:        Intake/Output Summary (Last 24 hours) at 07/07/16 1033 Last data filed at 07/07/16 0900  Gross per 24 hour  Intake           1385.4 ml  Output             1375 ml  Net             10.4 ml   Filed Weights   07/05/16 0526 07/06/16 0527 07/07/16 0455  Weight: 215 lb 9.6 oz (97.8 kg) 237 lb 14.4 oz (107.9 kg) 237 lb 1.6 oz (107.5 kg)    Telemetry    Sinus rhythm with rate PACs and PVCs- Personally Reviewed  ECG    None today   Physical Exam   GEN: No acute distress.   Neck: No JVD Cardiac: RRR, 8-0/3 systolic and diastolic murmurs, rubs, or gallops.  Respiratory: Clear to auscultation bilaterally. GI: Soft, nontender, non-distended  MS: No edema; No deformity. Neuro:  Nonfocal  Psych: Normal affect   Labs    Chemistry Recent Labs Lab  07/01/16 2118  07/05/16 0300 07/06/16 0502 07/07/16 0500  NA  --   < > 138 140 137  K  --   < > 4.2 4.3 3.9  CL  --   < > 106 106 104  CO2  --   < > 23 25 25   GLUCOSE  --   < > 101* 103* 100*  BUN  --   < > 19 17 14   CREATININE  --   < > 1.02 0.97 0.91  CALCIUM  --   < > 8.3* 8.6* 8.3*  PROT 7.6  --  6.2*  --   --   ALBUMIN 3.0*  --  2.2*  --   --   AST 21  --  16  --   --   ALT 17  --  17  --   --   ALKPHOS 126  --  82  --   --   BILITOT 0.7  --  0.3  --   --   GFRNONAA  --   < > >60 >60 >60  GFRAA  --   < > >60 >60 >60  ANIONGAP  --   < > 9 9 8   < > = values in this interval not displayed.   Hematology Recent Labs Lab 07/03/16 0715 07/04/16 1248 07/05/16 0300 07/07/16 0500  WBC 12.9*  --  8.0 8.2  RBC 3.42* 3.79* 3.39* 3.31*  HGB 8.5*  --  8.5* 8.3*  HCT 27.1*  --  27.4* 26.7*  MCV 79.2  --  80.8 80.7  MCH 24.9*  --  25.1* 25.1*  MCHC 31.4  --  31.0 31.1  RDW 16.8*  --  17.2* 17.4*  PLT 237  --  246 252    Cardiac Enzymes Recent Labs Lab 07/02/16 1954 07/03/16 0117 07/03/16 0715  TROPONINI 0.89* 1.01* 0.78*   No results for input(s): TROPIPOC in the last 168 hours.   BNPNo results for input(s): BNP, PROBNP in the last 168 hours.   DDimer No results for input(s): DDIMER in the last 168 hours.   Radiology    No results found.  Cardiac Studies   Echo: 07/03/16: Study Conclusions  - Left ventricle: The cavity size was mildly dilated. Wall thickness was increased in a pattern of mild LVH. Systolic function was normal. The estimated ejection fraction was 55%. Probable hypokinesis of the apicalinferior myocardium. Doppler parameters are consistent with abnormal left ventricular relaxation (grade 1 diastolic dysfunction). Doppler parameters are consistent with high ventricular filling pressure. - Aortic valve: Bicuspid; moderately thickened leaflets. Intermittently noted small echodensities seen near leaflet tips in the parasternal  views, although not well in other views. Suspicious for vegetations particularly in light of bacteremia although TEE is indicated for further assessment, and also to exclude any associated abscess in light of fairly peripheral aortic regurgitation. There was moderate regurgitation. Mean gradient (S): 5 mm Hg. Peak gradient (S): 8 mm Hg. Regurgitation pressure half-time: 383 ms. - Aorta: Aortic root dimension: 50 mm (ED). - Aortic root: The aortic root was moderately dilated. - Mitral valve: Calcified annulus. There was mild regurgitation. - Right atrium: Echodensity noted near the septum and tricuspid valve plane, best seen in the apical views but not in the subcostal views. Suspect artifact or perhaps visualization of compressed free wall, although in light of bacteremia would suggest further imaging with TEE to exclude other pathology. Central venous pressure (est): 8 mm Hg. - Tricuspid valve: There was trivial regurgitation. - Pulmonary arteries: PA peak pressure: 28 mm Hg (S). - Pericardium, extracardiac: There was no pericardial effusion.  Impressions:  - Mildly dilated left ventricle with mild LVH and LVEF approximately 55%. Probable hypokinesis of the apical inferior wall. Grade 1 diastolic dysfunction with increased filling pressures. Calcified mitral annulus with mild mitral regurgitation. Aortic valve is bicuspid and moderately thickened. Intermittently noted small echodensities seen near leaflet tips in the parasternal views, although not well in other views. Suspicious for vegetations particularly in light of bacteremia although TEE is indicated for further assessment, and also to exclude any associated abscess in light of fairly peripheral aortic regurgitation. Aortic root is moderately dilated, measures up to 50 mm. Trivial tricuspid regurgitation with PASP estimated 28 mmHg. Echodensity noted in right atrium near the  septum and tricuspid valve plane, best seen in the apical views but not in the subcostal views. Suspect artifact or perhaps visualization of compressed free wall, although in light of bacteremia would suggest further imaging with TEE to exclude other pathology. Results discussed with Dr. Jerilee Hoh.  Patient Profile  71 y.o. male with night sweats and weight loss. He was at [facility]. Cultures were drawn and he was sent home but was called back when his cultures became positive. He was found to have strep Viridans bacteremia. He has a history of a heart murmur.   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 ID and CT surgery on board. Repeat blood cultures negative.   2. 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 Related to bicuspid valve. Will need yearly assessment.   4. Elevated troponin - Flat trend. No acute ST changes on EKG. Chest pain free. Will need cardiac cath prior to any surgical treatment of his bicuspid valve. No urgent indication currently.   Jarrett Soho, PA  07/07/2016, 10:33 AM    Patient seen and examined and history reviewed. Agree with above findings and plan. Doing well. No symptoms. No fever. Telemetry shows no AV block. occ PACs and PVCs. For TEE today at 4 pm. Murmur is unchanged.  Saima Monterroso [place], [place] 07/07/2016 10:51 AM

## 2016-07-07 NOTE — CV Procedure (Signed)
Procedure: TEE  Indication: Bacteremia, r/o endocarditis  Sedation: Versed 2 mg IV, Fentanyl 50 mcg IV  Findings: Please see echo section for full report.  The left ventricle appeared moderately dilated with normal wall thickness.  EF 60-65%, normal wall motion.  Normal right ventricular size and systolic function.  Trivial TR, no vegetation.  No PV vegetation.  Normal left and right atrial sizes, no LA thrombus noted.  No PFO or ASD noted by color doppler.  There was mild mitral regurgitation, no MV vegetation.  The aortic valve was bicuspid.  There was eccentric aortic insufficiency that appeared severe.  There was no aortic stenosis.  No definite vegetation seen.  There was a small area of thickening with possible potential space at the valve base adjacent to the RV.  The AI appeared to originate from this area.  However, I am not convinced this is a vegetation or abscess. The aortic root was dilated to to 4.7 cm with a 4.4 cm ascending aorta.  Diastolic flow reversal noted in the ascending thoracic aorta.   Impression:  1. Moderately dilated LV with normal systolic function.  2. Bicuspid aortic valve with severe eccentric aortic insufficiency.  No definite infection seen.  3. Moderately dilated aortic root and ascending aorta.   Loralie Champagne 07/07/2016 1:06 PM

## 2016-07-07 NOTE — Consult Note (Signed)
DENTAL CONSULTATION  Date of Consultation:  07/07/2016 Patient Name:   Charles [name] Date of Birth:   April 05, 1946 Medical Record Number: [id number]  VITALS: BP 133/67 (BP Location: Left Arm)   Pulse 75   Temp 98.5 F (36.9 C) (Oral)   Resp 18   Ht 6' (1.829 m)   Wt 237 lb 1.6 oz (107.5 kg)   SpO2 96%   BMI 32.16 kg/m   CHIEF COMPLAINT: Patient referred by Dr. Sloan Leiter for a dental consultation.  HPI: Charles [name] is a 71 year old male with recent diagnosis of bacterial endocarditis. The patient is currently under care of infectious disease with active treatment with Rocephin IV antibiotic therapy.   The patient with anticipated aortic valve replacement or repair in the future with Dr. Roxy Manns. Patient now seen as part of a medically necessary pre-heart valve surgery dental protocol examination rule out dental infection that may further affect the patient's systemic health and anticipated heart valve surgery.   Patient currently denies acute toothaches, swellings, or abscesses. Patient was last seen in January 2018 for dental treatment with Dr. Aquilla Solian in [place]. Patient with additional dental treatment scheduled for early April of 2018. Patient has been seeing Dr. Andree Elk since June or July of 2017. Patient moved from [place] to [place] at that time. Patient usually sees the dentist on an every 6 month basis. Patient denies having dental phobia. Patient has a lower partial denture by report.  PROBLEM LIST: Patient Active Problem List   Diagnosis Date Noted  . Weight loss   . Streptococcal endocarditis   . Protein-calorie malnutrition, severe 07/03/2016  . Anemia 07/02/2016  . H/O cardiac murmur 07/02/2016  . Ascending aortic aneurysm ([facility]) 07/02/2016  . NSTEMI (non-ST elevated myocardial infarction) ([facility]) 07/02/2016  . Bicuspid aortic valve 07/02/2016  . Aortic insufficiency 07/02/2016    PMH: Past Medical History:  Diagnosis Date  . Anemia   . Aortic  insufficiency   . Ascending aortic aneurysm ([facility])    4.6 cm  . Bacteremia 07/02/2016   Streptococcus viridans   . Bicuspid aortic valve     PSH: Past Surgical History:  Procedure Laterality Date  . HERNIA REPAIR     left inguinal    ALLERGIES: No Known Allergies  MEDICATIONS: Current Facility-Administered Medications  Medication Dose Route Frequency Provider Last Rate Last Dose  . acetaminophen (TYLENOL) tablet 650 mg  650 mg Oral Q6H PRN Bethena Roys, MD   650 mg at 07/06/16 2113   Or  . acetaminophen (TYLENOL) suppository 650 mg  650 mg Rectal Q6H PRN Ejiroghene E Emokpae, MD      . atorvastatin (LIPITOR) tablet 40 mg  40 mg Oral q1800 Ejiroghene E Denton Brick, MD   40 mg at 07/06/16 1646  . enoxaparin (LOVENOX) injection 40 mg  40 mg Subcutaneous Q24H Ejiroghene Arlyce Dice, MD   40 mg at 07/06/16 2114  . feeding supplement (BOOST / RESOURCE BREEZE) liquid 1 Container  1 Container Oral TID BM Estela Leonie Green, MD   1 Container at 07/06/16 2130  . feeding supplement (ENSURE ENLIVE) (ENSURE ENLIVE) liquid 237 mL  237 mL Oral BID BM Ejiroghene E Emokpae, MD   237 mL at 07/06/16 1646  . feeding supplement (PRO-STAT SUGAR FREE 64) liquid 30 mL  30 mL Oral BID Erline Hau, MD   30 mL at 07/06/16 2113  . ferrous sulfate tablet 325 mg  325 mg Oral Q breakfast Shanker M  Ghimire, MD   325 mg at 07/06/16 0953  . ondansetron (ZOFRAN) tablet 4 mg  4 mg Oral Q6H PRN Ejiroghene Arlyce Dice, MD       Or  . ondansetron (ZOFRAN) injection 4 mg  4 mg Intravenous Q6H PRN Ejiroghene E Emokpae, MD      . penicillin G potassium 12 Million Units in dextrose 5 % 500 mL continuous infusion  12 Million Units Intravenous Q12H Carlyle Basques, MD   12 Million Units at 07/06/16 2135  . senna-docusate (Senokot-S) tablet 1 tablet  1 tablet Oral QHS PRN Ejiroghene E Emokpae, MD      . sodium chloride flush (NS) 0.9 % injection 10-40 mL  10-40 mL Intracatheter Q12H Jonetta Osgood, MD   10  mL at 07/06/16 2135  . sodium chloride flush (NS) 0.9 % injection 10-40 mL  10-40 mL Intracatheter PRN Jonetta Osgood, MD      . sodium chloride flush (NS) 0.9 % injection 3 mL  3 mL Intravenous Q12H Ejiroghene E Denton Brick, MD   3 mL at 07/06/16 0954    LABS: Lab Results  Component Value Date   WBC 8.2 07/07/2016   HGB 8.3 (L) 07/07/2016   HCT 26.7 (L) 07/07/2016   MCV 80.7 07/07/2016   PLT 252 07/07/2016      Component Value Date/Time   NA 137 07/07/2016 0500   K 3.9 07/07/2016 0500   CL 104 07/07/2016 0500   CO2 25 07/07/2016 0500   GLUCOSE 100 (H) 07/07/2016 0500   BUN 14 07/07/2016 0500   CREATININE 0.91 07/07/2016 0500   CALCIUM 8.3 (L) 07/07/2016 0500   GFRNONAA >60 07/07/2016 0500   GFRAA >60 07/07/2016 0500   No results found for: INR, PROTIME No results found for: PTT  SOCIAL HISTORY: Social History   Social History  . Marital status: Married    Spouse name: N/A  . Number of children: 3  . Years of education: N/A   Occupational History  . Not on file.   Social History Main Topics  . Smoking status: Former Smoker    Types: Cigarettes  . Smokeless tobacco: Never Used     Comment: Quit 30 years ago.    . Alcohol use No  . Drug use: No  . Sexual activity: Not on file   Other Topics Concern  . Not on file   Social History Narrative  . No narrative on file    FAMILY HISTORY: Family History  Problem Relation Age of Onset  . Ovarian cancer Mother   . Heart disease Father 65    Died of coronary thrombosis.      REVIEW OF SYSTEMS: Reviewed with the patient as per history of present illness.  Psych: Patient denies having dental phobia.   DENTAL HISTORY: CHIEF COMPLAINT: Patient referred by Dr. Sloan Leiter for a dental consultation.  HPI: Charles [name] is a 71 year old male with recent diagnosis of bacterial endocarditis. The patient is currently under care of infectious disease with active treatment with Rocephin IV antibiotic therapy.   The  patient with anticipated aortic valve replacement or repair in the future with Dr. Roxy Manns. Patient now seen as part of a medically necessary pre-heart valve surgery dental protocol examination rule out dental infection that may further affect the patient's systemic health and anticipated heart valve surgery.   Patient currently denies acute toothaches, swellings, or abscesses. Patient was last seen in January 2018 for dental treatment with Dr. Aquilla Solian in  [place], [place] Patient with additional dental treatment scheduled for early April of 2018. Patient has been seeing Dr. Andree Elk since June or July of 2017. Patient moved from [place] to [place] at that time. Patient usually sees the dentist on an every 6 month basis. Patient denies having dental phobia. Patient has a lower partial denture by report.  DENTAL EXAMINATION: GENERAL: The patient is a well-developed, well-nourished male in no acute distress. HEAD AND NECK: There is no palpable neck lymphadenopathy. The patient denies acute TMJ symptoms. INTRAORAL EXAM: Patient has normal saliva. There is no evidence of abscess formation. Patient has bilateral mandibular lingual tori. Patient has a deep palatal vault. DENTITION: The patient is missing tooth numbers 13, 16, 17, 19, 30, and 31. 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 DENTAL CARIES/SUBOPTIMAL RESTORATIONS: No obvious dental caries are noted. Ideally patient would have a full series of dental radiographs to rule out incipient dental caries. ENDODONTIC: Patient currently denies acute pulpitis symptoms. Patient appears to have possible periapical radiolucency associated with the apices of tooth numbers 2, 4, 14, and 29 per review of suboptimal orthopantogram. CROWN AND BRIDGE: Patient has multiple crown restorations noted. PROSTHODONTIC: Patient has a lower partial denture that "  fits fine" by patient report. OCCLUSION: Patient has a poor occlusal scheme secondary to multiple missing teeth, supra-eruption and drifting of the unopposed teeth into the edentulous areas, and lack of replacement of all missing teeth with dental prostheses.  RADIOGRAPHIC INTERPRETATION: A suboptimal orthopantogram was taken on 07/05/2016. There are multiple missing teeth. There is supra-eruption and drifting of the unopposed teeth into the edentulous areas. There is incipient to moderate bone loss noted. There appears to be periapical radiolucencies associated with tooth numbers 2, ?3, 4,14, and 29.  Tooth numbers 2, 4, 14, and 29 have previous root canal therapies.    ASSESSMENTS: 1. Streptococcal endocarditis 2. Moderate aortic insufficiency with history of heart valve surgery  3. Pre-heart valve surgery dental protocol examination 4. Questionable periapical radiolucencies associated with tooth numbers 2, 3, 4, 14, and 29. Ideally need periapical radiographs of these areas to further evaluate for periapical pathology. 5. Chronic periodontitis with bone loss 6. Accretions 7. Gingival recession 8. Lower partial denture 9. Multiple missing teeth 10. Poor occlusal scheme and malocclusion  11. Current Lovenox therapy with risk for bleeding with invasive dental procedures 12. Need for evaluation for antibiotic premedication prior to invasive dental procedures due to history of endocarditis.  PLAN/RECOMMENDATIONS: 1. I discussed the risks, benefits, and complications of various treatment options with the patient in relationship to his medical and dental conditions, anticipated aortic valve replacement or repair, and future risk for endocarditis. We discussed various treatment options to include no treatment, multiple extractions with alveoloplasty, pre-prosthetic surgery as indicated, periodontal therapy, dental restorations, root canal therapy, crown and bridge therapy, implant therapy, and  replacement of missing teeth as indicated. We also discussed the need for referral to an endodontist and/or oral surgeon as indicated. I did call Dr. Andree Elk' office and had a discussion with Dr. Nyra Capes in the absence of Dr. Andree Elk.  The patient was to be referred for extraction of tooth #14 with possible retreatment of tooth #4 with an endodontist. No apparent periapical pathology was noted at the apices of tooth numbers 2, 3, and 29 upon review of radiographs taken approximately 5-6 months ago. After discussion with Dr. Roxy Manns, it was determined that patient most likely will proceed  with aortic valve replacement in the near future. I will therefore schedule the patient for dental x-rays and reexamination to discuss plan of care for the patient as an Outpatient. He has been scheduled for Tuesday, 07/14/2016 at 10 AM at [facility].  In the meantime, the patient will remain on IV antibiotic therapy per the infectious disease team.    2. Discussion of findings with medical team and coordination of future medical and dental care as needed.    Lenn Cal, DDS

## 2016-07-07 NOTE — Plan of Care (Signed)
Problem: Education: Goal: Knowledge of [facility] Education information/materials will improve Outcome: Progressing Patient aware of plan of care.  RN and patient discussed all medications that have been administered thus far this shift.  Patient stated understanding.  Patient able to rate pain using numeric pain scale.

## 2016-07-07 NOTE — Progress Notes (Signed)
PROGRESS NOTE        PATIENT DETAILS Name: Charles [name] Age: 71 y.o. Sex: male Date of Birth: [birth year] Admit Date: 07/02/2016 Admitting Physician Bethena Roys, MD GLO:VFIE [place] Crane, MD  Brief Narrative: Patient is a 71 y.o. 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 See below for further details.  Subjective: No chest pain. No abdominal pain. No nausea or vomiting.  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 Cardiology planning on TEE on 3/27.  ID/CTVS following. Orthopantogram-shows possible periodontal disease affecting the right mandibular-spoke with Dr. Ritta Slot this morning  Elevated Troponin:?etiology-does have hx of occasional chest pain with mostly atypical features, however does have hypokinesis of apical inferior wall. Given his of AS-likely that he could have underlying CAD or could have septic embolic to his coronaries. Cardiology following  Anemia: probably secondary to acute illness-but apparently has Fe def anemia at baseline. Restart Fe supplementation. Per patient he has not had a screening colonoscopy-he will need to do in the outpatient setting.  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  Antimicrobial agents: Anti-infectives    Start     Dose/Rate Route Frequency Ordered Stop   07/05/16 1400  penicillin G potassium 12 Million Units in dextrose 5 % 500 mL continuous infusion     12 Million Units 41.7 mL/hr over 12 Hours Intravenous Every 12 hours 07/05/16 1312     07/03/16 1800  cefTRIAXone (ROCEPHIN) 2 g in dextrose 5 % 50 mL IVPB  Status:  Discontinued     2 g 100 mL/hr over 30 Minutes Intravenous Every 24 hours 07/03/16 1711 07/05/16 1312   07/03/16 0800  vancomycin (VANCOCIN) IVPB 750 mg/150 ml premix  Status:  Discontinued     750 mg 150 mL/hr over 60 Minutes Intravenous Every 12 hours 07/02/16 1953 07/03/16 1711   07/03/16 0000  piperacillin-tazobactam (ZOSYN) IVPB 3.375 g  Status:  Discontinued     3.375 g 12.5 mL/hr over 240 Minutes Intravenous Every 8 hours 07/02/16 1953 07/03/16 1205   07/02/16 2000  vancomycin (VANCOCIN) IVPB 1000 mg/200 mL premix     1,000 mg 200 mL/hr over 60 Minutes Intravenous  Once 07/02/16 1953 07/02/16 2232   07/02/16 1630  piperacillin-tazobactam (ZOSYN) IVPB 3.375 g     3.375 g 100 mL/hr over 30 Minutes Intravenous  Once 07/02/16 1623 07/02/16 1745   07/02/16 1630  vancomycin (VANCOCIN) IVPB 1000 mg/200 mL premix     1,000 mg 200 mL/hr over 60 Minutes Intravenous  Once 07/02/16 1623 07/02/16 1817      Procedures: Echo   CONSULTS:  cardiology   ID  Time spent: 25-minutes-Greater than 50% of this time was spent in counseling, explanation of diagnosis, planning of  further management, and coordination of care.  MEDICATIONS: Scheduled Meds: . atorvastatin  40 mg Oral q1800  . enoxaparin (LOVENOX) injection  40 mg Subcutaneous Q24H  . feeding supplement  1 Container Oral TID BM  . feeding supplement (ENSURE ENLIVE)  237 mL Oral BID BM  . feeding supplement (PRO-STAT SUGAR FREE 64)  30 mL Oral BID  . ferrous sulfate  325 mg Oral Q breakfast  . penicillin g  continuous IV infusion  12 Million Units Intravenous Q12H  . sodium chloride flush  10-40 mL Intracatheter Q12H  . sodium chloride flush  3 mL Intravenous Q12H   Continuous Infusions: PRN Meds:.acetaminophen **OR** acetaminophen, ondansetron **OR** ondansetron (ZOFRAN) IV, senna-docusate, sodium chloride flush   PHYSICAL EXAM: Vital signs: Vitals:   07/06/16 2141 07/07/16 0444 07/07/16 0446 07/07/16 0455  BP: 137/70  133/67   Pulse: 74  75   Resp: 20  18   Temp: 98.4 F (36.9 C) 98.5 F (36.9 C)    TempSrc: Axillary Oral    SpO2: 96%  96%   Weight:    107.5 kg (237 lb 1.6 oz)  Height:       Filed Weights   07/05/16 0526 07/06/16 0527 07/07/16 0455  Weight: 97.8 kg (215 lb 9.6 oz) 107.9 kg (237 lb 14.4 oz) 107.5 kg (237 lb 1.6 oz)   Body mass index is 32.16 kg/m.   General appearance :Awake, alert, not in any distress. Sitting up at chair at bedside. Eyes:, pupils equally reactive to light and accomodation HEENT: Atraumatic and Normocephalic Neck: supple, no JVD. No cervical lymphadenopathy. Resp:Good air entry bilaterally, no rhonchi CVS: S1 S2 regular, + syst murmur GI: Bowel sounds present, Non tender and not distended with no gaurding, rigidity or rebound Extremities: B/L Lower Ext shows no edema, both legs are warm to touch Neurology:  speech clear,Non focal, sensation is grossly intact. Psychiatric: Normal judgment and insight. Alert and oriented x 3.  Musculoskeletal:No digital cyanosis Skin:No Rash, warm and dry Wounds:N/A  I have personally reviewed following labs and imaging studies  LABORATORY DATA: CBC:  Recent Labs Lab 07/01/16 2017 07/02/16 1744 07/03/16 0715 07/05/16 0300 07/07/16 0500  WBC 16.0*  --  12.9* 8.0 8.2  NEUTROABS 13.8*  --   --   --   --   HGB 10.2* 9.9* 8.5* 8.5* 8.3*  HCT 32.0* 29.0* 27.1* 27.4* 26.7*  MCV 80.4  --  79.2 80.8 80.7  PLT 299  --  237 246 366    Basic Metabolic Panel:  Recent Labs Lab 07/01/16 2017  07/01/16 2118 07/02/16 1744 07/03/16 0715 07/05/16 0300 07/06/16 0502 07/07/16 0500  NA 134*  --  138 136 138 140 137  K 3.9  --  4.1 3.9 4.2 4.3 3.9  CL 101  --  102 106 106 106 104  CO2 24  --   --  24 23 25 25   GLUCOSE 148*  --  162* 123* 101* 103* 100*  BUN 27*  --  33* 23* 19 17 14   CREATININE 1.37*  --  1.20 1.19 1.02 0.97 0.91  CALCIUM 8.5*  --   --  8.0* 8.3* 8.6* 8.3*  MG  --  2.3  --   --   --   --  2.0    GFR: Estimated Creatinine Clearance: 98.5 mL/min (by C-G formula based on SCr of 0.91 mg/dL).  Liver Function Tests:  Recent Labs Lab 07/01/16 2118 07/05/16 0300  AST 21 16  ALT  17 17  ALKPHOS 126 82  BILITOT 0.7 0.3  PROT 7.6 6.2*  ALBUMIN 3.0* 2.2*   No results for input(s): LIPASE, AMYLASE in the last 168 hours. No results for input(s): AMMONIA in the last 168 hours.  Coagulation Profile: No results for input(s): INR, PROTIME in the last 168 hours.  Cardiac Enzymes:  Recent Labs Lab 07/02/16 1954 07/03/16 0117 07/03/16 0715  TROPONINI 0.89* 1.01* 0.78*    BNP (last 3 results) No results for input(s): PROBNP in the last 8760 hours.  HbA1C:  Recent Labs  07/05/16 0300  HGBA1C 6.0*    CBG: No results for input(s): GLUCAP in the last 168 hours.  Lipid Profile: No results for input(s): CHOL, HDL, LDLCALC, TRIG, CHOLHDL, LDLDIRECT in the last 72 hours.  Thyroid Function Tests: No results for input(s): TSH, T4TOTAL, FREET4, T3FREE, THYROIDAB in the last 72 hours.  Anemia Panel:  Recent Labs  07/04/16 1248  VITAMINB12 636  FOLATE 27.6  FERRITIN 411*  TIBC 234*  IRON 27*  RETICCTPCT 1.5    Urine analysis:    Component Value Date/Time   COLORURINE AMBER (A) 07/01/2016 2304   APPEARANCEUR HAZY (A) 07/01/2016 2304   LABSPEC 1.026 07/01/2016 2304   PHURINE 5.0 07/01/2016 2304   GLUCOSEU NEGATIVE 07/01/2016 2304   HGBUR MODERATE (A) 07/01/2016 2304   BILIRUBINUR NEGATIVE 07/01/2016 2304   KETONESUR 5 (A) 07/01/2016 2304    PROTEINUR 100 (A) 07/01/2016 2304   NITRITE NEGATIVE 07/01/2016 2304   LEUKOCYTESUR NEGATIVE 07/01/2016 2304    Sepsis Labs: Lactic Acid, Venous    Component Value Date/Time   LATICACIDVEN 1.21 07/02/2016 1744    MICROBIOLOGY: Recent Results (from the past 240 hour(s))  Blood culture (routine x 2)     Status: Abnormal   Collection Time: 07/02/16 12:08 AM  Result Value Ref Range Status   Specimen Description BLOOD RIGHT HAND  Final   Special Requests BOTTLES DRAWN AEROBIC AND ANAEROBIC [place]  Final   Culture  Setup Time   Final    GRAM POSITIVE COCCI Gram Stain Report Called to,Read Back By and Verified With: MINTER,R ON 07/02/16 @1515  BY MITCHELL,S IN BOTH AEROBIC AND ANAEROBIC BOTTLES    Culture (A)  Final    VIRIDANS STREPTOCOCCUS SUSCEPTIBILITIES PERFORMED ON PREVIOUS CULTURE WITHIN THE LAST 5 DAYS. Performed at [facility], [place] [address] [place], [place] [address]    Report Status 07/04/2016 FINAL  Final  Blood culture (routine x 2)     Status: Abnormal   Collection Time: 07/02/16 12:17 AM  Result Value Ref Range Status   Specimen Description BLOOD RIGHT ARM  Final   Special Requests BOTTLES DRAWN AEROBIC AND ANAEROBIC [place]  Final   Culture  Setup Time   Final    GRAM POSITIVE COCCI Gram Stain Report Called to,Read Back By and Verified With: BECCA MINTER RN @ 9562 ON 3.22.18 BY BAYSE,L IN BOTH AEROBIC AND ANAEROBIC BOTTLES CRITICAL RESULT CALLED TO, READ BACK BY AND VERIFIED WITH: T BULLOCK,RN @0041  07/03/16 MKELLY,MLT Performed at [facility], [place] [address] [place], [place] [address]    Culture VIRIDANS STREPTOCOCCUS (A)  Final   Report Status 07/04/2016 FINAL  Final   Organism ID, Bacteria VIRIDANS STREPTOCOCCUS  Final      Susceptibility   Viridans streptococcus - MIC*    PENICILLIN <=0.06 SENSITIVE Sensitive     CEFTRIAXONE <=0.12 SENSITIVE Sensitive     ERYTHROMYCIN 2 RESISTANT Resistant     LEVOFLOXACIN 1  SENSITIVE Sensitive     VANCOMYCIN  0.5 SENSITIVE Sensitive     * VIRIDANS STREPTOCOCCUS  Blood Culture ID Panel (Reflexed)     Status: Abnormal   Collection Time: 07/02/16 12:17 AM  Result Value Ref Range Status   Enterococcus species NOT DETECTED NOT DETECTED Final   Listeria monocytogenes NOT DETECTED NOT DETECTED Final   Staphylococcus species NOT DETECTED NOT DETECTED Final   Staphylococcus aureus NOT DETECTED NOT DETECTED Final   Streptococcus species DETECTED (A) NOT DETECTED Final    Comment: Not Enterococcus species, Streptococcus agalactiae, Streptococcus pyogenes, or Streptococcus pneumoniae. CRITICAL RESULT CALLED TO, READ BACK BY AND VERIFIED WITH: T BULLOCK,RN @0041  07/03/16 MKELLY,MLT    Streptococcus agalactiae NOT DETECTED NOT DETECTED Final   Streptococcus pneumoniae NOT DETECTED NOT DETECTED Final   Streptococcus pyogenes NOT DETECTED NOT DETECTED Final   Acinetobacter baumannii NOT DETECTED NOT DETECTED Final   Enterobacteriaceae species NOT DETECTED NOT DETECTED Final   Enterobacter cloacae complex NOT DETECTED NOT DETECTED Final   Escherichia coli NOT DETECTED NOT DETECTED Final   Klebsiella oxytoca NOT DETECTED NOT DETECTED Final   Klebsiella pneumoniae NOT DETECTED NOT DETECTED Final   Proteus species NOT DETECTED NOT DETECTED Final   Serratia marcescens NOT DETECTED NOT DETECTED Final   Haemophilus influenzae NOT DETECTED NOT DETECTED Final   Neisseria meningitidis NOT DETECTED NOT DETECTED Final   Pseudomonas aeruginosa NOT DETECTED NOT DETECTED Final   Candida albicans NOT DETECTED NOT DETECTED Final   Candida glabrata NOT DETECTED NOT DETECTED Final   Candida krusei NOT DETECTED NOT DETECTED Final   Candida parapsilosis NOT DETECTED NOT DETECTED Final   Candida tropicalis NOT DETECTED NOT DETECTED Final    Comment: Performed at [facility], [place] [address] [place], [place] [address]  Culture, blood (Routine X 2) w Reflex to ID Panel     Status: Abnormal   Collection Time: 07/02/16   4:31 PM  Result Value Ref Range Status   Specimen Description RIGHT ANTECUBITAL  Final   Special Requests BOTTLES DRAWN AEROBIC AND ANAEROBIC 10 CC EACH  Final   Culture  Setup Time   Final    GRAM POSITIVE COCCI Gram Stain Report Called to,Read Back By and Verified With: JOHNSON B. AT [place] ON [id number] BY THOMPSON S. IN BOTH AEROBIC AND ANAEROBIC BOTTLES CRITICAL VALUE NOTED.  VALUE IS CONSISTENT WITH PREVIOUSLY REPORTED AND CALLED VALUE.    Culture (A)  Final    VIRIDANS STREPTOCOCCUS SUSCEPTIBILITIES PERFORMED ON PREVIOUS CULTURE WITHIN THE LAST 5 DAYS. Performed at [facility], [place] [address] [place], [place] [address]    Report Status 07/05/2016 FINAL  Final  Culture, blood (Routine X 2) w Reflex to ID Panel     Status: Abnormal   Collection Time: 07/02/16  4:34 PM  Result Value Ref Range Status   Specimen Description BLOOD RIGHT HAND  Final   Special Requests BOTTLES DRAWN AEROBIC AND ANAEROBIC 10CC EACH  Final   Culture  Setup Time   Final    GRAM POSITIVE COCCI Gram Stain Report Called to,Read Back By and Verified With: JOHNSON B. AT [place] ON [id number] BY THOMPSON S. IN BOTH AEROBIC AND ANAEROBIC BOTTLES CRITICAL VALUE NOTED.  VALUE IS CONSISTENT WITH PREVIOUSLY REPORTED AND CALLED VALUE.    Culture (A)  Final    VIRIDANS STREPTOCOCCUS SUSCEPTIBILITIES PERFORMED ON PREVIOUS CULTURE WITHIN THE LAST 5 DAYS. Performed at [facility], [place] [address] [place], [place] [address]  Report Status 07/05/2016 FINAL  Final  Culture, blood (routine x 2)     Status: None (Preliminary result)   Collection Time: 07/04/16 12:48 PM  Result Value Ref Range Status   Specimen Description BLOOD LEFT HAND  Final   Special Requests IN PEDIATRIC BOTTLE 1.5CC  Final   Culture NO GROWTH 2 DAYS  Final   Report Status PENDING  Incomplete  Culture, blood (routine x 2)     Status: None (Preliminary result)   Collection Time: 07/04/16 12:55 PM  Result Value Ref Range Status   Specimen  Description BLOOD LEFT HAND  Final   Special Requests IN PEDIATRIC BOTTLE 1CC  Final   Culture NO GROWTH 2 DAYS  Final   Report Status PENDING  Incomplete    RADIOLOGY STUDIES/RESULTS: Dg Orthopantogram  Result Date: 07/05/2016 CLINICAL DATA:  Congestive heart failure. History of dental work. Denies jaw pain. EXAM: ORTHOPANTOGRAM/PANORAMIC COMPARISON:  None. FINDINGS: Several root canal procedures have been performed. Surrounding a mandibular RIGHT premolar tooth, there is a periapical lucency (arrow). Several teeth are also missing. IMPRESSION: Query periodontal disease affecting a RIGHT mandibular premolar. Electronically Signed   By: Staci Righter M.D.   On: 07/05/2016 09:07   Dg Chest 2 View  Result Date: 07/05/2016 CLINICAL DATA:  Patient with history of congestive heart failure. EXAM: CHEST  2 VIEW COMPARISON:  Chest radiograph 07/01/2016; chest CT 07/02/2016. FINDINGS: Monitoring leads overlie the patient. Stable prominent cardiac and mediastinal contours with tortuosity of the thoracic aorta. Minimal heterogeneous opacities lung bases bilaterally. Stable biapical pleuroparenchymal thickening. Lateral view limited due to overlapping soft tissue. Thoracic spine degenerative changes. IMPRESSION: Tortuous aorta. Aortic atherosclerosis. Basilar atelectasis. Electronically Signed   By: Lovey Newcomer M.D.   On: 07/05/2016 08:12   Dg Chest 2 View  Result Date: 07/01/2016 CLINICAL DATA:  71 year old male with night sweats and weight loss. EXAM: CHEST  2 VIEW COMPARISON:  Chest radiograph dated 05/01/2016 FINDINGS: The lungs are clear. There is no pleural effusion or pneumothorax. The cardiac silhouette is within normal limits. The aorta is tortuous. There is prominence of the aortic root on the lateral projection which may be the artifactual and related to tortuosity. Aneurysmal dilatation of the ascending aorta or aortic root is not excluded. CT of the chest with contrast may provide better  evaluation. No acute osseous pathology identified. IMPRESSION: 1. No acute cardiopulmonary process. 2. Tortuous aorta with dilated appearance of the ascending aorta/aortic root. CT with contrast may provide better evaluation. Electronically Signed   By: Anner Crete M.D.   On: 07/01/2016 23:21   Ct Head Wo Contrast  Result Date: 07/01/2016 CLINICAL DATA:  Fall in December not feeling well EXAM: CT HEAD WITHOUT CONTRAST TECHNIQUE: Contiguous axial images were obtained from the base of the skull through the vertex without intravenous contrast. COMPARISON:  None. FINDINGS: Brain: No acute territorial infarction, intracranial hemorrhage or focal mass lesion is visualized. The ventricles are nonenlarged. Mild atrophy. Vascular: No hyperdense vessels. Scattered calcifications at the carotid siphons. Skull: Normal. Negative for fracture or focal lesion. Sinuses/Orbits: Minimal mucosal thickening in the ethmoid sinuses. Prior maxillary antrostomies. Tiny mucous retention cysts or small polyps in the maxillary sinuses. Other: None IMPRESSION: No CT evidence for acute intracranial abnormality. Electronically Signed   By: Donavan Foil M.D.   On: 07/01/2016 23:03   Ct Chest W Contrast  Result Date: 07/02/2016 CLINICAL DATA:  71 year old male with weight loss, night sweats, and abnormal chest x-ray. EXAM: CT CHEST WITH  CONTRAST TECHNIQUE: Multidetector CT imaging of the chest was performed during intravenous contrast administration. CONTRAST:  42mL ISOVUE-300 IOPAMIDOL (ISOVUE-300) INJECTION 61% COMPARISON:  Chest radiograph dated 07/01/2016 FINDINGS: Cardiovascular: Top-normal cardiac size. No pericardial effusion. The thoracic aorta is tortuous. There is mild dilatation of the ascending aorta measuring up to 4.4 cm in diameter. No aortic dissection. The origins of the great vessels of the aortic arch appear patent. The central pulmonary arteries are grossly unremarkable. Evaluation of the pulmonary arteries is  limited due to suboptimal opacification and timing of the contrast. Mediastinum/Nodes: There is no hilar or mediastinal adenopathy. The esophagus and the thyroid gland are grossly unremarkable. Lungs/Pleura: There is mild elevation of the left hemidiaphragm with linear left lung base atelectasis/ scarring. Pneumonia is less likely. There is no pleural effusion or pneumothorax. The central airways are patent. Upper Abdomen: There is apparent fatty infiltration of the liver. A 2.1 cm hypodense lesion in the caudate lobe is not well characterized but may represent a cyst or hemangioma. There is a 2.5 cm indeterminate right adrenal hypodense nodule, most likely an adenoma. The visualized upper abdomen is otherwise unremarkable. Musculoskeletal: Mild degenerative changes of spine. No acute osseous pathology. IMPRESSION: 1. No acute intrathoracic pathology. 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 Recommend annual imaging followup by CTA or MRA. This recommendation follows 2010 ACCF/AHA/AATS/ACR/ASA/SCA/SCAI/SIR/STS/SVM Guidelines for the Diagnosis and Management of Patients with Thoracic Aortic Disease. Circulation. 2010; 121: R740-C144 2. Fatty liver. A small hypodense lesion in the caudate lobe is indeterminate but likely represents a cyst or hemangioma. 3. Probable right adrenal adenoma. Electronically Signed   By: Anner Crete M.D.   On: 07/02/2016 01:14     LOS: 5 days   Oren Binet, MD  [facility] Pager:[phone]  If 7PM-7AM, please contact night-coverage [email or web address] Password [facility] 07/07/2016, 9:55 AM

## 2016-07-07 NOTE — Progress Notes (Signed)
Progress Note  Patient Name: Charles [name] Date of Encounter: 07/07/2016  Primary Cardiologist: New to Dr. Percival Spanish   Subjective   Sleepy today. No chest pain or dyspnea.   Inpatient Medications    Scheduled Meds: . atorvastatin  40 mg Oral q1800  . enoxaparin (LOVENOX) injection  40 mg Subcutaneous Q24H  . feeding supplement  1 Container Oral TID BM  . feeding supplement (ENSURE ENLIVE)  237 mL Oral BID BM  . feeding supplement (PRO-STAT SUGAR FREE 64)  30 mL Oral BID  . ferrous sulfate  325 mg Oral Q breakfast  . penicillin g continuous IV infusion  12 Million Units Intravenous Q12H  . sodium chloride flush  10-40 mL Intracatheter Q12H  . sodium chloride flush  3 mL Intravenous Q12H   Continuous Infusions:  PRN Meds: acetaminophen **OR** acetaminophen, ondansetron **OR** ondansetron (ZOFRAN) IV, senna-docusate, sodium chloride flush   Vital Signs    Vitals:   07/06/16 2141 07/07/16 0444 07/07/16 0446 07/07/16 0455  BP: 137/70  133/67   Pulse: 74  75   Resp: 20  18   Temp: 98.4 F (36.9 C) 98.5 F (36.9 C)    TempSrc: Axillary Oral    SpO2: 96%  96%   Weight:    237 lb 1.6 oz (107.5 kg)  Height:        Intake/Output Summary (Last 24 hours) at 07/07/16 1033 Last data filed at 07/07/16 0900  Gross per 24 hour  Intake           1385.4 ml  Output             1375 ml  Net             10.4 ml   Filed Weights   07/05/16 0526 07/06/16 0527 07/07/16 0455  Weight: 215 lb 9.6 oz (97.8 kg) 237 lb 14.4 oz (107.9 kg) 237 lb 1.6 oz (107.5 kg)    Telemetry    Sinus rhythm with rate PACs and PVCs- Personally Reviewed  ECG    None today   Physical Exam   GEN: No acute distress.   Neck: No JVD Cardiac: RRR, 4-6/9 systolic and diastolic murmurs, rubs, or gallops.  Respiratory: Clear to auscultation bilaterally. GI: Soft, nontender, non-distended  MS: No edema; No deformity. Neuro:  Nonfocal  Psych: Normal affect   Labs    Chemistry Recent Labs Lab  07/01/16 2118  07/05/16 0300 07/06/16 0502 07/07/16 0500  NA  --   < > 138 140 137  K  --   < > 4.2 4.3 3.9  CL  --   < > 106 106 104  CO2  --   < > 23 25 25   GLUCOSE  --   < > 101* 103* 100*  BUN  --   < > 19 17 14   CREATININE  --   < > 1.02 0.97 0.91  CALCIUM  --   < > 8.3* 8.6* 8.3*  PROT 7.6  --  6.2*  --   --   ALBUMIN 3.0*  --  2.2*  --   --   AST 21  --  16  --   --   ALT 17  --  17  --   --   ALKPHOS 126  --  82  --   --   BILITOT 0.7  --  0.3  --   --   GFRNONAA  --   < > >60 >60 >60  GFRAA  --   < > >60 >60 >60  ANIONGAP  --   < > 9 9 8   < > = values in this interval not displayed.   Hematology Recent Labs Lab 07/03/16 0715 07/04/16 1248 07/05/16 0300 07/07/16 0500  WBC 12.9*  --  8.0 8.2  RBC 3.42* 3.79* 3.39* 3.31*  HGB 8.5*  --  8.5* 8.3*  HCT 27.1*  --  27.4* 26.7*  MCV 79.2  --  80.8 80.7  MCH 24.9*  --  25.1* 25.1*  MCHC 31.4  --  31.0 31.1  RDW 16.8*  --  17.2* 17.4*  PLT 237  --  246 252    Cardiac Enzymes Recent Labs Lab 07/02/16 1954 07/03/16 0117 07/03/16 0715  TROPONINI 0.89* 1.01* 0.78*   No results for input(s): TROPIPOC in the last 168 hours.   BNPNo results for input(s): BNP, PROBNP in the last 168 hours.   DDimer No results for input(s): DDIMER in the last 168 hours.   Radiology    No results found.  Cardiac Studies   Echo: 07/03/16: Study Conclusions  - Left ventricle: The cavity size was mildly dilated. Wall thickness was increased in a pattern of mild LVH. Systolic function was normal. The estimated ejection fraction was 55%. Probable hypokinesis of the apicalinferior myocardium. Doppler parameters are consistent with abnormal left ventricular relaxation (grade 1 diastolic dysfunction). Doppler parameters are consistent with high ventricular filling pressure. - Aortic valve: Bicuspid; moderately thickened leaflets. Intermittently noted small echodensities seen near leaflet tips in the parasternal  views, although not well in other views. Suspicious for vegetations particularly in light of bacteremia although TEE is indicated for further assessment, and also to exclude any associated abscess in light of fairly peripheral aortic regurgitation. There was moderate regurgitation. Mean gradient (S): 5 mm Hg. Peak gradient (S): 8 mm Hg. Regurgitation pressure half-time: 383 ms. - Aorta: Aortic root dimension: 50 mm (ED). - Aortic root: The aortic root was moderately dilated. - Mitral valve: Calcified annulus. There was mild regurgitation. - Right atrium: Echodensity noted near the septum and tricuspid valve plane, best seen in the apical views but not in the subcostal views. Suspect artifact or perhaps visualization of compressed free wall, although in light of bacteremia would suggest further imaging with TEE to exclude other pathology. Central venous pressure (est): 8 mm Hg. - Tricuspid valve: There was trivial regurgitation. - Pulmonary arteries: PA peak pressure: 28 mm Hg (S). - Pericardium, extracardiac: There was no pericardial effusion.  Impressions:  - Mildly dilated left ventricle with mild LVH and LVEF approximately 55%. Probable hypokinesis of the apical inferior wall. Grade 1 diastolic dysfunction with increased filling pressures. Calcified mitral annulus with mild mitral regurgitation. Aortic valve is bicuspid and moderately thickened. Intermittently noted small echodensities seen near leaflet tips in the parasternal views, although not well in other views. Suspicious for vegetations particularly in light of bacteremia although TEE is indicated for further assessment, and also to exclude any associated abscess in light of fairly peripheral aortic regurgitation. Aortic root is moderately dilated, measures up to 50 mm. Trivial tricuspid regurgitation with PASP estimated 28 mmHg. Echodensity noted in right atrium near the  septum and tricuspid valve plane, best seen in the apical views but not in the subcostal views. Suspect artifact or perhaps visualization of compressed free wall, although in light of bacteremia would suggest further imaging with TEE to exclude other pathology. Results discussed with Dr. Jerilee Hoh.  Patient Profile  71 y.o. male with night sweats and weight loss. He was at [facility]. Cultures were drawn and he was sent home but was called back when his cultures became positive. He was found to have strep Viridans bacteremia. He has a history of a heart murmur.   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 ID and CT surgery on board. Repeat blood cultures negative.   2. 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 Related to bicuspid valve. Will need yearly assessment.   4. Elevated troponin - Flat trend. No acute ST changes on EKG. Chest pain free. Will need cardiac cath prior to any surgical treatment of his bicuspid valve. No urgent indication currently.   Jarrett Soho, PA  07/07/2016, 10:33 AM    Patient seen and examined and history reviewed. Agree with above findings and plan. Doing well. No symptoms. No fever. Telemetry shows no AV block. occ PACs and PVCs. For TEE today at 4 pm. Murmur is unchanged.  Aleksander Edmiston Martin[place], [place] 07/07/2016 10:51 AM

## 2016-07-07 NOTE — Progress Notes (Signed)
  Echocardiogram Echocardiogram Transesophageal has been performed.  Charles [name] 07/07/2016, 1:09 PM

## 2016-07-08 ENCOUNTER — Encounter (HOSPITAL_COMMUNITY): Payer: Self-pay | Admitting: Cardiology

## 2016-07-08 DIAGNOSIS — B954 Other streptococcus as the cause of diseases classified elsewhere: Secondary | ICD-10-CM

## 2016-07-08 DIAGNOSIS — I330 Acute and subacute infective endocarditis: Secondary | ICD-10-CM

## 2016-07-08 DIAGNOSIS — I441 Atrioventricular block, second degree: Secondary | ICD-10-CM | POA: Diagnosis not present

## 2016-07-08 DIAGNOSIS — Z6831 Body mass index (BMI) 31.0-31.9, adult: Secondary | ICD-10-CM

## 2016-07-08 DIAGNOSIS — R634 Abnormal weight loss: Secondary | ICD-10-CM

## 2016-07-08 LAB — BASIC METABOLIC PANEL
ANION GAP: 9 (ref 5–15)
BUN: 14 mg/dL (ref 6–20)
CALCIUM: 8.6 mg/dL — AB (ref 8.9–10.3)
CO2: 25 mmol/L (ref 22–32)
Chloride: 104 mmol/L (ref 101–111)
Creatinine, Ser: 1.03 mg/dL (ref 0.61–1.24)
GFR calc Af Amer: >60 mL/min (ref >60)
GLUCOSE: 110 mg/dL — AB (ref 65–99)
Potassium: 4.3 mmol/L (ref 3.5–5.1)
Sodium: 138 mmol/L (ref 135–145)

## 2016-07-08 MED ORDER — DEXTROSE 5 % IV SOLN
2.0000 g | INTRAVENOUS | Status: DC
Start: 2016-07-08 — End: 2016-07-09
  Administered 2016-07-08 – 2016-07-09 (×2): 2 g via INTRAVENOUS
  Filled 2016-07-08 (×2): qty 2

## 2016-07-08 NOTE — Plan of Care (Signed)
Problem: Nutrition: Goal: Adequate nutrition will be maintained Outcome: Progressing Patient has drank all of scheduled Boost supplements the last two nights in a row.

## 2016-07-08 NOTE — Progress Notes (Signed)
Progress Note  Patient Name: Charles [name] Date of Encounter: 07/08/2016  Primary Cardiologist: New to Dr. Percival Spanish   Subjective    No chest pain or dyspnea. Feels very well. No dizziness or palpitations.  Inpatient Medications    Scheduled Meds: . atorvastatin  40 mg Oral q1800  . enoxaparin (LOVENOX) injection  40 mg Subcutaneous Q24H  . feeding supplement  1 Container Oral TID BM  . feeding supplement (ENSURE ENLIVE)  237 mL Oral BID BM  . feeding supplement (PRO-STAT SUGAR FREE 64)  30 mL Oral BID  . ferrous sulfate  325 mg Oral Q breakfast  . penicillin g continuous IV infusion  12 Million Units Intravenous Q12H  . sodium chloride flush  10-40 mL Intracatheter Q12H  . sodium chloride flush  3 mL Intravenous Q12H   Continuous Infusions:  PRN Meds: acetaminophen **OR** acetaminophen, ondansetron **OR** ondansetron (ZOFRAN) IV, senna-docusate, sodium chloride flush   Vital Signs    Vitals:   07/07/16 1308 07/07/16 1320 07/07/16 1900 07/08/16 0500  BP: (!) 159/70 (!) 153/68 (!) 105/59 (!) 118/51  Pulse: 73 72 78 78  Resp: 14 19 17 19   Temp: 97.8 F (36.6 C)  97.7 F (36.5 C) 98.3 F (36.8 C)  TempSrc: Oral  Oral Oral  SpO2: 98% 95% 96% 96%  Weight:    235 lb 1.6 oz (106.6 kg)  Height:        Intake/Output Summary (Last 24 hours) at 07/08/16 0947 Last data filed at 07/08/16 5956  Gross per 24 hour  Intake             1667 ml  Output             2675 ml  Net            -1008 ml   Filed Weights   07/07/16 0455 07/07/16 1130 07/08/16 0500  Weight: 237 lb 1.6 oz (107.5 kg) 237 lb 1.6 oz (107.5 kg) 235 lb 1.6 oz (106.6 kg)    Telemetry    Sinus rhythm with prolonged PR and intermittent second degree AV block (Wenckebach block).  Personally Reviewed  ECG    None today   Physical Exam   GEN: No acute distress.   Neck: No JVD Cardiac: RRR, 2/6 systolic and diastolic murmurs, rubs, or gallops.  Respiratory: Clear to auscultation bilaterally. GI: Soft,  nontender, non-distended  MS: No edema; No deformity. Neuro:  Nonfocal  Psych: Normal affect   Labs    Chemistry Recent Labs Lab 07/01/16 2118  07/05/16 0300 07/06/16 0502 07/07/16 0500 07/08/16 0510  NA  --   < > 138 140 137 138  K  --   < > 4.2 4.3 3.9 4.3  CL  --   < > 106 106 104 104  CO2  --   < > 23 25 25 25   GLUCOSE  --   < > 101* 103* 100* 110*  BUN  --   < > 19 17 14 14   CREATININE  --   < > 1.02 0.97 0.91 1.03  CALCIUM  --   < > 8.3* 8.6* 8.3* 8.6*  PROT 7.6  --  6.2*  --   --   --   ALBUMIN 3.0*  --  2.2*  --   --   --   AST 21  --  16  --   --   --   ALT 17  --  17  --   --   --  ALKPHOS 126  --  82  --   --   --   BILITOT 0.7  --  0.3  --   --   --   GFRNONAA  --   < > >60 >60 >60 >60  GFRAA  --   < > >60 >60 >60 >60  ANIONGAP  --   < > 9 9 8 9   < > = values in this interval not displayed.   Hematology  Recent Labs Lab 07/03/16 0715 07/04/16 1248 07/05/16 0300 07/07/16 0500  WBC 12.9*  --  8.0 8.2  RBC 3.42* 3.79* 3.39* 3.31*  HGB 8.5*  --  8.5* 8.3*  HCT 27.1*  --  27.4* 26.7*  MCV 79.2  --  80.8 80.7  MCH 24.9*  --  25.1* 25.1*  MCHC 31.4  --  31.0 31.1  RDW 16.8*  --  17.2* 17.4*  PLT 237  --  246 252    Cardiac Enzymes  Recent Labs Lab 07/02/16 1954 07/03/16 0117 07/03/16 0715  TROPONINI 0.89* 1.01* 0.78*   No results for input(s): TROPIPOC in the last 168 hours.   BNPNo results for input(s): BNP, PROBNP in the last 168 hours.   DDimer No results for input(s): DDIMER in the last 168 hours.   Radiology    No results found.  Cardiac Studies   Echo: 07/03/16: Study Conclusions  - Left ventricle: The cavity size was mildly dilated. Wall thickness was increased in a pattern of mild LVH. Systolic function was normal. The estimated ejection fraction was 55%. Probable hypokinesis of the apicalinferior myocardium. Doppler parameters are consistent with abnormal left ventricular relaxation (grade 1 diastolic  dysfunction). Doppler parameters are consistent with high ventricular filling pressure. - Aortic valve: Bicuspid; moderately thickened leaflets. Intermittently noted small echodensities seen near leaflet tips in the parasternal views, although not well in other views. Suspicious for vegetations particularly in light of bacteremia although TEE is indicated for further assessment, and also to exclude any associated abscess in light of fairly peripheral aortic regurgitation. There was moderate regurgitation. Mean gradient (S): 5 mm Hg. Peak gradient (S): 8 mm Hg. Regurgitation pressure half-time: 383 ms. - Aorta: Aortic root dimension: 50 mm (ED). - Aortic root: The aortic root was moderately dilated. - Mitral valve: Calcified annulus. There was mild regurgitation. - Right atrium: Echodensity noted near the septum and tricuspid valve plane, best seen in the apical views but not in the subcostal views. Suspect artifact or perhaps visualization of compressed free wall, although in light of bacteremia would suggest further imaging with TEE to exclude other pathology. Central venous pressure (est): 8 mm Hg. - Tricuspid valve: There was trivial regurgitation. - Pulmonary arteries: PA peak pressure: 28 mm Hg (S). - Pericardium, extracardiac: There was no pericardial effusion.  Impressions:  - Mildly dilated left ventricle with mild LVH and LVEF approximately 55%. Probable hypokinesis of the apical inferior wall. Grade 1 diastolic dysfunction with increased filling pressures. Calcified mitral annulus with mild mitral regurgitation. Aortic valve is bicuspid and moderately thickened. Intermittently noted small echodensities seen near leaflet tips in the parasternal views, although not well in other views. Suspicious for vegetations particularly in light of bacteremia although TEE is indicated for further assessment, and also to exclude any  associated abscess in light of fairly peripheral aortic regurgitation. Aortic root is moderately dilated, measures up to 50 mm. Trivial tricuspid regurgitation with PASP estimated 28 mmHg. Echodensity noted in right atrium near the septum and tricuspid valve plane,  best seen in the apical views but not in the subcostal views. Suspect artifact or perhaps visualization of compressed free wall, although in light of bacteremia would suggest further imaging with TEE to exclude other pathology. Results discussed with Dr. Jerilee Hoh.  TEE 07/07/16: Procedure: TEE  Indication: Bacteremia, r/o endocarditis  Sedation: Versed 2 mg IV, Fentanyl 50 mcg IV  Findings: Please see echo section for full report.  The left ventricle appeared moderately dilated with normal wall thickness.  EF 60-65%, normal wall motion.  Normal right ventricular size and systolic function.  Trivial TR, no vegetation.  No PV vegetation.  Normal left and right atrial sizes, no LA thrombus noted.  No PFO or ASD noted by color doppler.  There was mild mitral regurgitation, no MV vegetation.  The aortic valve was bicuspid.  There was eccentric aortic insufficiency that appeared severe.  There was no aortic stenosis.  No definite vegetation seen.  There was a small area of thickening with possible potential space at the valve base adjacent to the RV.  The AI appeared to originate from this area.  However, I am not convinced this is a vegetation or abscess. The aortic root was dilated to to 4.7 cm with a 4.4 cm ascending aorta.  Diastolic flow reversal noted in the ascending thoracic aorta.   Impression:  1. Moderately dilated LV with normal systolic function.  2. Bicuspid aortic valve with severe eccentric aortic insufficiency.  No definite infection seen.  3. Moderately dilated aortic root and ascending aorta.   Loralie Champagne 07/07/2016 1:06 PM  Patient Profile     71 y.o. male with night sweats and weight loss.  He was at [facility]. Cultures were drawn and he was sent home but was called back when his cultures became positive. He was found to have strep Viridans bacteremia. He has a history of a heart murmur.   Assessment & Plan    1. Endocarditis- strep viridans  -  ID and CT surgery on board. On IV antibiotics. No fever.  Repeat blood cultures negative. 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 Would consider a full 6 week course of antibiotics IV. Development of progressive PR interval prolongation and second degree AV block is concerning.   2. Bicuspid aortic valve - confirmed on TEE. Severe eccentric AI. Some LV dilation. Will eventually need AVR and Aortic root grafting but hopefully we can wait for clearing of infection.  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 Related to bicuspid valve. Will need yearly assessment.   4. Elevated troponin - Flat trend. No acute ST changes on EKG. Chest pain free. Will need cardiac cath prior to any surgical treatment of his bicuspid valve. No urgent indication currently.   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 Patient's PR interval was normal on admission. Now progressively prolonged with intermittent second degree AV block. He is on no rate slowing medication. I would recommend continued monitoring on telemetry.   Signed,  Peter Martinique, [place] 07/08/2016 9:47 AM

## 2016-07-08 NOTE — Progress Notes (Signed)
Patient ID: Charles [name], male   DOB: [birth year], 71 y.o.   MRN: [id number]          [facility]  Date of Admission:  07/02/2016   Total days of antibiotics 7        Day 4 penicillin         Principal Problem:   Streptococcal endocarditis Active Problems:   Anemia   H/O cardiac murmur   Ascending aortic aneurysm (HCC)   NSTEMI (non-ST elevated myocardial infarction) (HCC)   Protein-calorie malnutrition, severe   Bicuspid aortic valve   Aortic insufficiency   Weight loss   Second degree AV block, Mobitz type I   . atorvastatin  40 mg Oral q1800  . enoxaparin (LOVENOX) injection  40 mg Subcutaneous Q24H  . feeding supplement  1 Container Oral TID BM  . feeding supplement (ENSURE ENLIVE)  237 mL Oral BID BM  . feeding supplement (PRO-STAT SUGAR FREE 64)  30 mL Oral BID  . ferrous sulfate  325 mg Oral Q breakfast  . penicillin g continuous IV infusion  12 Million Units Intravenous Q12H  . sodium chloride flush  10-40 mL Intracatheter Q12H  . sodium chloride flush  3 mL Intravenous Q12H    SUBJECTIVE: He is feeling better.  Review of Systems: Review of Systems  Constitutional: Positive for weight loss. Negative for chills, diaphoresis and fever.  Respiratory: Negative for cough, sputum production and shortness of breath.   Cardiovascular: Negative for chest pain.  Gastrointestinal: Negative for abdominal pain, diarrhea, nausea and vomiting.    Past Medical History:  Diagnosis Date  . Anemia   . Aortic insufficiency   . Ascending aortic aneurysm (HCC)    4.6 cm  . Bacteremia 07/02/2016   Streptococcus viridans   . Bicuspid aortic valve     Social History  Substance Use Topics  . Smoking status: Former Smoker    Types: Cigarettes  . Smokeless tobacco: Never Used     Comment: Quit 30 years ago.    . Alcohol use No    Family History  Problem Relation Age of Onset  . Ovarian cancer Mother   . Heart disease Father 58    Died of  coronary thrombosis.     No Known Allergies  OBJECTIVE: Vitals:   07/07/16 1320 07/07/16 1900 07/08/16 0500 07/08/16 1453  BP: (!) 153/68 (!) 105/59 (!) 118/51 (!) 121/59  Pulse: 72 78 78 81  Resp: '19 17 19 15  '$ Temp:  97.7 F (36.5 C) 98.3 F (36.8 C) 98.2 F (36.8 C)  TempSrc:  Oral Oral Oral  SpO2: 95% 96% 96% 98%  Weight:   235 lb 1.6 oz (106.6 kg)   Height:       Body mass index is 31.89 kg/m.  Physical Exam  Constitutional:  He has good spirits. He is sitting up in a chair working on his laptop computer.  Cardiovascular: Normal rate and regular rhythm.   Murmur heard. No change in to and fro, systolic/diastolic murmur.  Pulmonary/Chest: Effort normal and breath sounds normal. He has no wheezes. He has no rales.    Lab Results Lab Results  Component Value Date   WBC 8.2 07/07/2016   HGB 8.3 (L) 07/07/2016   HCT 26.7 (L) 07/07/2016   MCV 80.7 07/07/2016   PLT 252 07/07/2016    Lab Results  Component Value Date   CREATININE 1.03 07/08/2016   BUN 14 07/08/2016   NA 138 07/08/2016  K 4.3 07/08/2016   CL 104 07/08/2016   CO2 25 07/08/2016    Lab Results  Component Value Date   ALT 17 07/05/2016   AST 16 07/05/2016   ALKPHOS 82 07/05/2016   BILITOT 0.3 07/05/2016     Microbiology: Recent Results (from the past 240 hour(s))  Blood culture (routine x 2)     Status: Abnormal   Collection Time: 07/02/16 12:08 AM  Result Value Ref Range Status   Specimen Description BLOOD RIGHT HAND  Final   Special Requests BOTTLES DRAWN AEROBIC AND ANAEROBIC [place]  Final   Culture  Setup Time   Final    GRAM POSITIVE COCCI Gram Stain Report Called to,Read Back By and Verified With: MINTER,R ON 07/02/16 '@1515'$  BY MITCHELL,S IN BOTH AEROBIC AND ANAEROBIC BOTTLES    Culture (A)  Final    VIRIDANS STREPTOCOCCUS SUSCEPTIBILITIES PERFORMED ON PREVIOUS CULTURE WITHIN THE LAST 5 DAYS. Performed at [facility], [place] [address], [place], [place] [address]    Report Status  07/04/2016 FINAL  Final  Blood culture (routine x 2)     Status: Abnormal   Collection Time: 07/02/16 12:17 AM  Result Value Ref Range Status   Specimen Description BLOOD RIGHT ARM  Final   Special Requests BOTTLES DRAWN AEROBIC AND ANAEROBIC [place]  Final   Culture  Setup Time   Final    GRAM POSITIVE COCCI Gram Stain Report Called to,Read Back By and Verified With: BECCA MINTER RN @ 9024 ON 3.22.18 BY BAYSE,L IN BOTH AEROBIC AND ANAEROBIC BOTTLES CRITICAL RESULT CALLED TO, READ BACK BY AND VERIFIED WITH: T BULLOCK,RN '@0041'$  07/03/16 MKELLY,MLT Performed at [facility], [place] [address], [place], [place] [address]    Culture VIRIDANS STREPTOCOCCUS (A)  Final   Report Status 07/04/2016 FINAL  Final   Organism ID, Bacteria VIRIDANS STREPTOCOCCUS  Final      Susceptibility   Viridans streptococcus - MIC*    PENICILLIN <=0.06 SENSITIVE Sensitive     CEFTRIAXONE <=0.12 SENSITIVE Sensitive     ERYTHROMYCIN 2 RESISTANT Resistant     LEVOFLOXACIN 1 SENSITIVE Sensitive     VANCOMYCIN 0.5 SENSITIVE Sensitive     * VIRIDANS STREPTOCOCCUS  Blood Culture ID Panel (Reflexed)     Status: Abnormal   Collection Time: 07/02/16 12:17 AM  Result Value Ref Range Status   Enterococcus species NOT DETECTED NOT DETECTED Final   Listeria monocytogenes NOT DETECTED NOT DETECTED Final   Staphylococcus species NOT DETECTED NOT DETECTED Final   Staphylococcus aureus NOT DETECTED NOT DETECTED Final   Streptococcus species DETECTED (A) NOT DETECTED Final    Comment: Not Enterococcus species, Streptococcus agalactiae, Streptococcus pyogenes, or Streptococcus pneumoniae. CRITICAL RESULT CALLED TO, READ BACK BY AND VERIFIED WITH: T BULLOCK,RN '@0041'$  07/03/16 MKELLY,MLT    Streptococcus agalactiae NOT DETECTED NOT DETECTED Final   Streptococcus pneumoniae NOT DETECTED NOT DETECTED Final   Streptococcus pyogenes NOT DETECTED NOT DETECTED Final   Acinetobacter baumannii NOT DETECTED NOT DETECTED Final    Enterobacteriaceae species NOT DETECTED NOT DETECTED Final   Enterobacter cloacae complex NOT DETECTED NOT DETECTED Final   Escherichia coli NOT DETECTED NOT DETECTED Final   Klebsiella oxytoca NOT DETECTED NOT DETECTED Final   Klebsiella pneumoniae NOT DETECTED NOT DETECTED Final   Proteus species NOT DETECTED NOT DETECTED Final   Serratia marcescens NOT DETECTED NOT DETECTED Final   Haemophilus influenzae NOT DETECTED NOT DETECTED Final   Neisseria meningitidis NOT DETECTED NOT DETECTED Final  Pseudomonas aeruginosa NOT DETECTED NOT DETECTED Final   Candida albicans NOT DETECTED NOT DETECTED Final   Candida glabrata NOT DETECTED NOT DETECTED Final   Candida krusei NOT DETECTED NOT DETECTED Final   Candida parapsilosis NOT DETECTED NOT DETECTED Final   Candida tropicalis NOT DETECTED NOT DETECTED Final    Comment: Performed at [facility], [place] [address] [place], [place] [address]  Culture, blood (Routine X 2) w Reflex to ID Panel     Status: Abnormal   Collection Time: 07/02/16  4:31 PM  Result Value Ref Range Status   Specimen Description RIGHT ANTECUBITAL  Final   Special Requests BOTTLES DRAWN AEROBIC AND ANAEROBIC 10 CC EACH  Final   Culture  Setup Time   Final    GRAM POSITIVE COCCI Gram Stain Report Called to,Read Back By and Verified With: JOHNSON B. AT [place] ON [id number] BY THOMPSON S. IN BOTH AEROBIC AND ANAEROBIC BOTTLES CRITICAL VALUE NOTED.  VALUE IS CONSISTENT WITH PREVIOUSLY REPORTED AND CALLED VALUE.    Culture (A)  Final    VIRIDANS STREPTOCOCCUS SUSCEPTIBILITIES PERFORMED ON PREVIOUS CULTURE WITHIN THE LAST 5 DAYS. Performed at [facility], [place] [address], [place], [place] [address]    Report Status 07/05/2016 FINAL  Final  Culture, blood (Routine X 2) w Reflex to ID Panel     Status: Abnormal   Collection Time: 07/02/16  4:34 PM  Result Value Ref Range Status   Specimen Description BLOOD RIGHT HAND  Final   Special Requests BOTTLES DRAWN AEROBIC  AND ANAEROBIC 10CC EACH  Final   Culture  Setup Time   Final    GRAM POSITIVE COCCI Gram Stain Report Called to,Read Back By and Verified With: JOHNSON B. AT [place] ON [id number] BY THOMPSON S. IN BOTH AEROBIC AND ANAEROBIC BOTTLES CRITICAL VALUE NOTED.  VALUE IS CONSISTENT WITH PREVIOUSLY REPORTED AND CALLED VALUE.    Culture (A)  Final    VIRIDANS STREPTOCOCCUS SUSCEPTIBILITIES PERFORMED ON PREVIOUS CULTURE WITHIN THE LAST 5 DAYS. Performed at [facility], [place] [address] [place], [place] [address]    Report Status 07/05/2016 FINAL  Final  Culture, blood (routine x 2)     Status: None (Preliminary result)   Collection Time: 07/04/16 12:48 PM  Result Value Ref Range Status   Specimen Description BLOOD LEFT HAND  Final   Special Requests IN PEDIATRIC BOTTLE 1.5CC  Final   Culture NO GROWTH 4 DAYS  Final   Report Status PENDING  Incomplete  Culture, blood (routine x 2)     Status: None (Preliminary result)   Collection Time: 07/04/16 12:55 PM  Result Value Ref Range Status   Specimen Description BLOOD LEFT HAND  Final   Special Requests IN PEDIATRIC BOTTLE 1CC  Final   Culture NO GROWTH 4 DAYS  Final   Report Status PENDING  Incomplete     ASSESSMENT: His TEE revealed a bicuspid aortic valve with severe aortic insufficiency. No vegetation was seen but I am quite certain he has had recent subacute endocarditis due to severity and strep given his recent clinical illness and positive blood cultures. He also now has a prolonged PR interval and second-degree heart block raising suspicion for perivalvular infection although no abscess was seen.  PLAN: 1. I will change penicillin to once daily ceftriaxone and plan on 4 weeks of IV antibiotic therapy according to current guidelines  Diagnosis: Aortic valve endocarditis  Culture Result: Ferritin strep  No Known Allergies  Discharge antibiotics: Per pharmacy protocol ceftriaxone  Duration: 4 weeks End Date: 07/29/2016  [facility]  Care Per Protocol:  Labs weekly while on IV antibiotics: _x_ CBC with differential _x_ BMP __ CMP __ CRP __ ESR __ Vancomycin trough  __ Please pull PIC at completion of IV antibiotics _x_ Please leave PIC in place until doctor has seen patient or been notified  Fax weekly labs to [phone]  Clinic Follow Up Appt: I will arrange follow-up in clinic by 07/29/2016    Michel Bickers, [place] for [place] 336 [phone] pager   [phone] cell 07/08/2016, 3:24 PM

## 2016-07-08 NOTE — Progress Notes (Signed)
PHARMACY CONSULT NOTE FOR:  Ceftriaxone  OUTPATIENT  PARENTERAL ANTIBIOTIC THERAPY (OPAT)  Indication:   Aortic valve endocarditis Regimen:   Ceftriaxone 2g IV q24 End date:   07/29/16  IV antibiotic discharge orders are pended. To discharging provider:  please sign these orders via discharge navigator,  Select New Orders & click on the button choice - Manage This Unsigned Work.     Thank you for allowing pharmacy to be a part of this patient's care.   Gracy Bruins, PharmD Clinical Pharmacist [facility]

## 2016-07-08 NOTE — Progress Notes (Signed)
[facility] Infusion Coordinator is following Mr. Goldie for home IV ABX as ordered upon DC to home.  [facility] will partner with patient's home health agency of choice pending his decision.  [facility] will follow until DC to support HH needs.   If patient discharges after hours, please call [phone].   Larry Sierras 07/08/2016, 9:36 AM

## 2016-07-08 NOTE — Care Management Note (Signed)
Case Management Note  Patient Details  Name: Charles [name] MRN: [id number] Date of Birth: [birth year]  Subjective/Objective:  Pt presented for Bacterial Endocarditis of the Bicuspid Aortic Valve-TEE confirmed. Plan will be to return home with his wife with [facility] for IV antibiotic therapy and PT Services for evaluation and treatment 2/2 to deconditioning.                 Action/Plan: CM did offer agency choice and pt chose [facility]. CM did make referral with [facility] Liaison with [facility]. SOC to begin within 24-48 hours post d/c. No further needs at this time.  Expected Discharge Date:                  Expected Discharge Plan:  [place]  In-House Referral:  NA  Discharge planning Services  CM Consult  Post Acute Care Choice:  Durable Medical Equipment, Home Health Choice offered to:  Patient  DME Arranged:  IV pump/equipment DME Agency:  [facility] Arranged:  RN, PT [facility] Agency:  [facility]  Status of Service:  Completed, signed off  If discussed at [place] of Stay Meetings, dates discussed:    Additional Comments:  Bethena Roys, RN 07/08/2016, 10:29 AM

## 2016-07-08 NOTE — Progress Notes (Signed)
      [address]       [place] [address]             [phone]     CARDIOTHORACIC SURGERY PROGRESS NOTE  1 Day Post-Op  S/P Procedure(s) (LRB): TRANSESOPHAGEAL ECHOCARDIOGRAM (TEE) (N/A)  Subjective: No complaints.  Denies SOB.  Overall feeling better.  Objective: Vital signs in last 24 hours: Temp:  [97.7 F (36.5 C)-98.3 F (36.8 C)] 98.2 F (36.8 C) (03/28 1453) Pulse Rate:  [78-81] 81 (03/28 1453) Cardiac Rhythm: Heart block (03/28 1200) Resp:  [15-19] 15 (03/28 1453) BP: (105-121)/(51-59) 121/59 (03/28 1453) SpO2:  [96 %-98 %] 98 % (03/28 1453) Weight:  [235 lb 1.6 oz (106.6 kg)] 235 lb 1.6 oz (106.6 kg) (03/28 0500)  Physical Exam:  Rhythm:   sinus  Breath sounds: clear  Heart sounds:  RRR w/ diastolic murmur  Incisions:  n/a  Abdomen:  soft  Extremities:  warm   Intake/Output from previous day: 03/27 0701 - 03/28 0700 In: 1307 [P.O.:807; IV Piggyback:500] Out: 2675 [Urine:2675] Intake/Output this shift: Total I/O In: 360 [P.O.:360] Out: 850 [Urine:850]  Lab Results:  Recent Labs  07/07/16 0500  WBC 8.2  HGB 8.3*  HCT 26.7*  PLT 252   BMET:  Recent Labs  07/07/16 0500 07/08/16 0510  NA 137 138  K 3.9 4.3  CL 104 104  CO2 25 25  GLUCOSE 100* 110*  BUN 14 14  CREATININE 0.91 1.03  CALCIUM 8.3* 8.6*    CBG (last 3)  No results for input(s): GLUCAP in the last 72 hours. PT/INR:  No results for input(s): LABPROT, INR in the last 72 hours.  CXR:  N/A  Assessment/Plan: S/P Procedure(s) (LRB): TRANSESOPHAGEAL ECHOCARDIOGRAM (TEE) (N/A)  Patient remains clinically stable and overall improved.  No signs of CHF.  I have personally reviewed the patient's TEE.  The valve appears trileaflet with severe aortic insufficiency.  There is some degree of prolapse of the left and non-coronary leaflet, but I am concerned that there may be leaflet perforation.  No definite vegetations were seen but I don't think there's much question  that he has SBE.  I agree w/ plans to d/c home on IV antibiotics.  He will need to be monitored closely for the development of CHF.  He needs to get his teeth taken care of.  He probably should have surgery sooner rather than later.  He will need diagnostic cardiac catheterization prior to surgery.  I will arrange for follow up in my office in 4-6 weeks.  Please call if his condition changes and he needs to be seen sooner.  I spent in excess of 15 minutes during the conduct of this hospital encounter and >50% of this time involved direct face-to-face encounter with the patient for counseling and/or coordination of their care.    [name], MD 07/08/2016 5:50 PM

## 2016-07-08 NOTE — Plan of Care (Signed)
Problem: Bowel/Gastric: Goal: Will not experience complications related to bowel motility Outcome: Completed/Met Date Met: 07/08/16 Per patient report and previous RN charting patient has been experiencing daily bowel movements.

## 2016-07-08 NOTE — Progress Notes (Signed)
PROGRESS NOTE        PATIENT DETAILS Name: Charles [name] Age: 71 y.o. Sex: male Date of Birth: 08/13/1945 Admit Date: 07/02/2016 Admitting Physician Bethena Roys, MD OFB:PZWC [place] Crane, MD  Brief Narrative: Patient is a 71 y.o. 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 See below for further details.  Subjective: No chest pain. No abdominal pain. No nausea or vomiting.  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 Underwent TEE on 3/27-although no convincing evidence for any vegetation, cardiology recommending that we continue to treat as if patient has endocarditis. Patient with episode of second-degree Mobitz type I heart block this morning.   Mobitz type I second degree AV block: Seen in telemetry early this morning, concerning given issues with his aortic valve and possible underlying endocarditis. Spoke with cardiology-Dr. Martin[place], recommends continued telemetry monitoring.  Elevated Troponin:?etiology-does have hx of occasional chest pain with mostly atypical features, however does have hypokinesis of apical inferior wall. Given his of AS-likely that he could have underlying CAD or could have septic embolic to his coronaries. Cardiology following  Questionable periapical radiolucencies associated with tooth numbers 2, 3, 4, 14, and 29: Seen by dentistry, plans are for outpatient evaluation.  Anemia: probably secondary to acute illness-but apparently has Fe def anemia at baseline. Restart Fe supplementation. Per patient he has not had a screening colonoscopy-he will need to do in the  outpatient setting.  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  Antimicrobial agents: Anti-infectives    Start     Dose/Rate Route Frequency Ordered Stop   07/05/16 1400  penicillin G potassium 12 Million Units in dextrose 5 % 500 mL continuous infusion     12 Million Units 41.7 mL/hr over 12 Hours Intravenous Every 12 hours 07/05/16 1312     07/03/16 1800  cefTRIAXone (ROCEPHIN) 2 g in dextrose 5 % 50 mL IVPB  Status:  Discontinued     2 g 100 mL/hr over 30 Minutes Intravenous Every 24 hours 07/03/16 1711 07/05/16 1312   07/03/16 0800  vancomycin (VANCOCIN) IVPB 750 mg/150 ml premix  Status:  Discontinued     750 mg 150 mL/hr over 60 Minutes Intravenous Every 12 hours 07/02/16 1953 07/03/16 1711   07/03/16 0000  piperacillin-tazobactam (ZOSYN) IVPB 3.375 g  Status:  Discontinued     3.375 g 12.5 mL/hr over 240 Minutes Intravenous Every 8 hours 07/02/16 1953 07/03/16 1205   07/02/16 2000  vancomycin (VANCOCIN) IVPB 1000 mg/200 mL premix     1,000 mg 200 mL/hr over 60 Minutes Intravenous  Once 07/02/16 1953 07/02/16 2232   07/02/16 1630  piperacillin-tazobactam (ZOSYN) IVPB 3.375 g     3.375 g 100 mL/hr over 30 Minutes  Intravenous  Once 07/02/16 1623 07/02/16 1745   07/02/16 1630  vancomycin (VANCOCIN) IVPB 1000 mg/200 mL premix     1,000 mg 200 mL/hr over 60 Minutes Intravenous  Once 07/02/16 1623 07/02/16 1817      Procedures: Echo   CONSULTS:  cardiology   ID  Time spent: 25-minutes-Greater than 50% of this time was spent in  counseling, explanation of diagnosis, planning of further management, and coordination of care.  MEDICATIONS: Scheduled Meds: . atorvastatin  40 mg Oral q1800  . enoxaparin (LOVENOX) injection  40 mg Subcutaneous Q24H  . feeding supplement  1 Container Oral TID BM  . feeding supplement (ENSURE ENLIVE)  237 mL Oral BID BM  . feeding supplement (PRO-STAT SUGAR FREE 64)  30 mL Oral BID  . ferrous sulfate  325 mg Oral Q breakfast  . penicillin g continuous IV infusion  12 Million Units Intravenous Q12H  . sodium chloride flush  10-40 mL Intracatheter Q12H  . sodium chloride flush  3 mL Intravenous Q12H   Continuous Infusions: PRN Meds:.acetaminophen **OR** acetaminophen, ondansetron **OR** ondansetron (ZOFRAN) IV, senna-docusate, sodium chloride flush   PHYSICAL EXAM: Vital signs: Vitals:   07/07/16 1308 07/07/16 1320 07/07/16 1900 07/08/16 0500  BP: (!) 159/70 (!) 153/68 (!) 105/59 (!) 118/51  Pulse: 73 72 78 78  Resp: 14 19 17 19   Temp: 97.8 F (36.6 C)  97.7 F (36.5 C) 98.3 F (36.8 C)  TempSrc: Oral  Oral Oral  SpO2: 98% 95% 96% 96%  Weight:    106.6 kg (235 lb 1.6 oz)  Height:       Filed Weights   07/07/16 0455 07/07/16 1130 07/08/16 0500  Weight: 107.5 kg (237 lb 1.6 oz) 107.5 kg (237 lb 1.6 oz) 106.6 kg (235 lb 1.6 oz)   Body mass index is 31.89 kg/m.   General appearance :Awake, alert, not in any distress.  Eyes:, pupils equally reactive to light and accomodation HEENT: Atraumatic and Normocephalic Neck: supple, no JVD. No cervical lymphadenopathy. Resp:Good air entry bilaterally, no rales CVS: S1 S2 regular, + syst murmur GI: Bowel sounds present, Non tender and not distended with no gaurding, rigidity or rebound Extremities: B/L Lower Ext shows no edema, both legs are warm to touch Neurology:  speech clear,Non focal, sensation is grossly intact. Psychiatric: Normal judgment and insight. Alert and oriented x 3.  Musculoskeletal:No digital cyanosis Skin:No  Rash, warm and dry Wounds:N/A  I have personally reviewed following labs and imaging studies  LABORATORY DATA: CBC:  Recent Labs Lab 07/01/16 2017 07/02/16 1744 07/03/16 0715 07/05/16 0300 07/07/16 0500  WBC 16.0*  --  12.9* 8.0 8.2  NEUTROABS 13.8*  --   --   --   --   HGB 10.2* 9.9* 8.5* 8.5* 8.3*  HCT 32.0* 29.0* 27.1* 27.4* 26.7*  MCV 80.4  --  79.2 80.8 80.7  PLT 299  --  237 246 528    Basic Metabolic Panel:  Recent Labs Lab 07/01/16 2118  07/03/16 0715 07/05/16 0300 07/06/16 0502 07/07/16 0500 07/08/16 0510  NA  --   < > 136 138 140 137 138  K  --   < > 3.9 4.2 4.3 3.9 4.3  CL  --   < > 106 106 106 104 104  CO2  --   --  24 23 25 25 25   GLUCOSE  --   < > 123* 101* 103* 100* 110*  BUN  --   < > 23* 19 17  14 14  CREATININE  --   < > 1.19 1.02 0.97 0.91 1.03  CALCIUM  --   --  8.0* 8.3* 8.6* 8.3* 8.6*  MG 2.3  --   --   --   --  2.0  --   < > = values in this interval not displayed.  GFR: Estimated Creatinine Clearance: 86.6 mL/min (by C-G formula based on SCr of 1.03 mg/dL).  Liver Function Tests:  Recent Labs Lab 07/01/16 2118 07/05/16 0300  AST 21 16  ALT 17 17  ALKPHOS 126 82  BILITOT 0.7 0.3  PROT 7.6 6.2*  ALBUMIN 3.0* 2.2*   No results for input(s): LIPASE, AMYLASE in the last 168 hours. No results for input(s): AMMONIA in the last 168 hours.  Coagulation Profile: No results for input(s): INR, PROTIME in the last 168 hours.  Cardiac Enzymes:  Recent Labs Lab 07/02/16 1954 07/03/16 0117 07/03/16 0715  TROPONINI 0.89* 1.01* 0.78*    BNP (last 3 results) No results for input(s): PROBNP in the last 8760 hours.  HbA1C: No results for input(s): HGBA1C in the last 72 hours.  CBG: No results for input(s): GLUCAP in the last 168 hours.  Lipid Profile: No results for input(s): CHOL, HDL, LDLCALC, TRIG, CHOLHDL, LDLDIRECT in the last 72 hours.  Thyroid Function Tests: No results for input(s): TSH, T4TOTAL, FREET4, T3FREE,  THYROIDAB in the last 72 hours.  Anemia Panel: No results for input(s): VITAMINB12, FOLATE, FERRITIN, TIBC, IRON, RETICCTPCT in the last 72 hours.  Urine analysis:    Component Value Date/Time   COLORURINE AMBER (A) 07/01/2016 2304   APPEARANCEUR HAZY (A) 07/01/2016 2304   LABSPEC 1.026 07/01/2016 2304   PHURINE 5.0 07/01/2016 2304   GLUCOSEU NEGATIVE 07/01/2016 2304   HGBUR MODERATE (A) 07/01/2016 2304   BILIRUBINUR NEGATIVE 07/01/2016 2304   KETONESUR 5 (A) 07/01/2016 2304   PROTEINUR 100 (A) 07/01/2016 2304   NITRITE NEGATIVE 07/01/2016 2304   LEUKOCYTESUR NEGATIVE 07/01/2016 2304    Sepsis Labs: Lactic Acid, Venous    Component Value Date/Time   LATICACIDVEN 1.21 07/02/2016 1744    MICROBIOLOGY: Recent Results (from the past 240 hour(s))  Blood culture (routine x 2)     Status: Abnormal   Collection Time: 07/02/16 12:08 AM  Result Value Ref Range Status   Specimen Description BLOOD RIGHT HAND  Final   Special Requests BOTTLES DRAWN AEROBIC AND ANAEROBIC [place]  Final   Culture  Setup Time   Final    GRAM POSITIVE COCCI Gram Stain Report Called to,Read Back By and Verified With: MINTER,R ON 07/02/16 @1515  BY MITCHELL,S IN BOTH AEROBIC AND ANAEROBIC BOTTLES    Culture (A)  Final    VIRIDANS STREPTOCOCCUS SUSCEPTIBILITIES PERFORMED ON PREVIOUS CULTURE WITHIN THE LAST 5 DAYS. Performed at [facility], [place] [address] [place], [place] [address]    Report Status 07/04/2016 FINAL  Final  Blood culture (routine x 2)     Status: Abnormal   Collection Time: 07/02/16 12:17 AM  Result Value Ref Range Status   Specimen Description BLOOD RIGHT ARM  Final   Special Requests BOTTLES DRAWN AEROBIC AND ANAEROBIC [place]  Final   Culture  Setup Time   Final    GRAM POSITIVE COCCI Gram Stain Report Called to,Read Back By and Verified With: BECCA MINTER RN @ 1523 ON 3.22.18 BY BAYSE,L IN BOTH AEROBIC AND ANAEROBIC BOTTLES CRITICAL RESULT CALLED TO, READ BACK BY AND VERIFIED WITH:  T BULLOCK,RN @0041  07/03/16 MKELLY,MLT  Performed at [facility], [place] [address], [place], [place] [address]    Culture VIRIDANS STREPTOCOCCUS (A)  Final   Report Status 07/04/2016 FINAL  Final   Organism ID, Bacteria VIRIDANS STREPTOCOCCUS  Final      Susceptibility   Viridans streptococcus - MIC*    PENICILLIN <=0.06 SENSITIVE Sensitive     CEFTRIAXONE <=0.12 SENSITIVE Sensitive     ERYTHROMYCIN 2 RESISTANT Resistant     LEVOFLOXACIN 1 SENSITIVE Sensitive     VANCOMYCIN 0.5 SENSITIVE Sensitive     * VIRIDANS STREPTOCOCCUS  Blood Culture ID Panel (Reflexed)     Status: Abnormal   Collection Time: 07/02/16 12:17 AM  Result Value Ref Range Status   Enterococcus species NOT DETECTED NOT DETECTED Final   Listeria monocytogenes NOT DETECTED NOT DETECTED Final   Staphylococcus species NOT DETECTED NOT DETECTED Final   Staphylococcus aureus NOT DETECTED NOT DETECTED Final   Streptococcus species DETECTED (A) NOT DETECTED Final    Comment: Not Enterococcus species, Streptococcus agalactiae, Streptococcus pyogenes, or Streptococcus pneumoniae. CRITICAL RESULT CALLED TO, READ BACK BY AND VERIFIED WITH: T BULLOCK,RN @0041  07/03/16 MKELLY,MLT    Streptococcus agalactiae NOT DETECTED NOT DETECTED Final   Streptococcus pneumoniae NOT DETECTED NOT DETECTED Final   Streptococcus pyogenes NOT DETECTED NOT DETECTED Final   Acinetobacter baumannii NOT DETECTED NOT DETECTED Final   Enterobacteriaceae species NOT DETECTED NOT DETECTED Final   Enterobacter cloacae complex NOT DETECTED NOT DETECTED Final   Escherichia coli NOT DETECTED NOT DETECTED Final   Klebsiella oxytoca NOT DETECTED NOT DETECTED Final   Klebsiella pneumoniae NOT DETECTED NOT DETECTED Final   Proteus species NOT DETECTED NOT DETECTED Final   Serratia marcescens NOT DETECTED NOT DETECTED Final   Haemophilus influenzae NOT DETECTED NOT DETECTED Final   Neisseria meningitidis NOT DETECTED NOT DETECTED Final   Pseudomonas  aeruginosa NOT DETECTED NOT DETECTED Final   Candida albicans NOT DETECTED NOT DETECTED Final   Candida glabrata NOT DETECTED NOT DETECTED Final   Candida krusei NOT DETECTED NOT DETECTED Final   Candida parapsilosis NOT DETECTED NOT DETECTED Final   Candida tropicalis NOT DETECTED NOT DETECTED Final    Comment: Performed at [facility], [place] [address], [place], [place] [address]  Culture, blood (Routine X 2) w Reflex to ID Panel     Status: Abnormal   Collection Time: 07/02/16  4:31 PM  Result Value Ref Range Status   Specimen Description RIGHT ANTECUBITAL  Final   Special Requests BOTTLES DRAWN AEROBIC AND ANAEROBIC 10 CC EACH  Final   Culture  Setup Time   Final    GRAM POSITIVE COCCI Gram Stain Report Called to,Read Back By and Verified With: JOHNSON B. AT [place] ON [id number] BY THOMPSON S. IN BOTH AEROBIC AND ANAEROBIC BOTTLES CRITICAL VALUE NOTED.  VALUE IS CONSISTENT WITH PREVIOUSLY REPORTED AND CALLED VALUE.    Culture (A)  Final    VIRIDANS STREPTOCOCCUS SUSCEPTIBILITIES PERFORMED ON PREVIOUS CULTURE WITHIN THE LAST 5 DAYS. Performed at [facility], [place] [address] [place], [place] [address]    Report Status 07/05/2016 FINAL  Final  Culture, blood (Routine X 2) w Reflex to ID Panel     Status: Abnormal   Collection Time: 07/02/16  4:34 PM  Result Value Ref Range Status   Specimen Description BLOOD RIGHT HAND  Final   Special Requests BOTTLES DRAWN AEROBIC AND ANAEROBIC 10CC EACH  Final   Culture  Setup Time   Final  GRAM POSITIVE COCCI Gram Stain Report Called to,Read Back By and Verified With: JOHNSON B. AT [place] ON [id number] BY THOMPSON S. IN BOTH AEROBIC AND ANAEROBIC BOTTLES CRITICAL VALUE NOTED.  VALUE IS CONSISTENT WITH PREVIOUSLY REPORTED AND CALLED VALUE.    Culture (A)  Final    VIRIDANS STREPTOCOCCUS SUSCEPTIBILITIES PERFORMED ON PREVIOUS CULTURE WITHIN THE LAST 5 DAYS. Performed at [facility], [place] [address], [place], [place] [address]     Report Status 07/05/2016 FINAL  Final  Culture, blood (routine x 2)     Status: None (Preliminary result)   Collection Time: 07/04/16 12:48 PM  Result Value Ref Range Status   Specimen Description BLOOD LEFT HAND  Final   Special Requests IN PEDIATRIC BOTTLE 1.5CC  Final   Culture NO GROWTH 3 DAYS  Final   Report Status PENDING  Incomplete  Culture, blood (routine x 2)     Status: None (Preliminary result)   Collection Time: 07/04/16 12:55 PM  Result Value Ref Range Status   Specimen Description BLOOD LEFT HAND  Final   Special Requests IN PEDIATRIC BOTTLE 1CC  Final   Culture NO GROWTH 3 DAYS  Final   Report Status PENDING  Incomplete    RADIOLOGY STUDIES/RESULTS: Dg Orthopantogram  Result Date: 07/05/2016 CLINICAL DATA:  Congestive heart failure. History of dental work. Denies jaw pain. EXAM: ORTHOPANTOGRAM/PANORAMIC COMPARISON:  None. FINDINGS: Several root canal procedures have been performed. Surrounding a mandibular RIGHT premolar tooth, there is a periapical lucency (arrow). Several teeth are also missing. IMPRESSION: Query periodontal disease affecting a RIGHT mandibular premolar. Electronically Signed   By: Staci Righter M.D.   On: 07/05/2016 09:07   Dg Chest 2 View  Result Date: 07/05/2016 CLINICAL DATA:  Patient with history of congestive heart failure. EXAM: CHEST  2 VIEW COMPARISON:  Chest radiograph 07/01/2016; chest CT 07/02/2016. FINDINGS: Monitoring leads overlie the patient. Stable prominent cardiac and mediastinal contours with tortuosity of the thoracic aorta. Minimal heterogeneous opacities lung bases bilaterally. Stable biapical pleuroparenchymal thickening. Lateral view limited due to overlapping soft tissue. Thoracic spine degenerative changes. IMPRESSION: Tortuous aorta. Aortic atherosclerosis. Basilar atelectasis. Electronically Signed   By: Lovey Newcomer M.D.   On: 07/05/2016 08:12   Dg Chest 2 View  Result Date: 07/01/2016 CLINICAL DATA:  71 year old male with  night sweats and weight loss. EXAM: CHEST  2 VIEW COMPARISON:  Chest radiograph dated 05/01/2016 FINDINGS: The lungs are clear. There is no pleural effusion or pneumothorax. The cardiac silhouette is within normal limits. The aorta is tortuous. There is prominence of the aortic root on the lateral projection which may be the artifactual and related to tortuosity. Aneurysmal dilatation of the ascending aorta or aortic root is not excluded. CT of the chest with contrast may provide better evaluation. No acute osseous pathology identified. IMPRESSION: 1. No acute cardiopulmonary process. 2. Tortuous aorta with dilated appearance of the ascending aorta/aortic root. CT with contrast may provide better evaluation. Electronically Signed   By: Anner Crete M.D.   On: 07/01/2016 23:21   Ct Head Wo Contrast  Result Date: 07/01/2016 CLINICAL DATA:  Fall in December not feeling well EXAM: CT HEAD WITHOUT CONTRAST TECHNIQUE: Contiguous axial images were obtained from the base of the skull through the vertex without intravenous contrast. COMPARISON:  None. FINDINGS: Brain: No acute territorial infarction, intracranial hemorrhage or focal mass lesion is visualized. The ventricles are nonenlarged. Mild atrophy. Vascular: No hyperdense vessels. Scattered calcifications at the carotid siphons. Skull: Normal. Negative  for fracture or focal lesion. Sinuses/Orbits: Minimal mucosal thickening in the ethmoid sinuses. Prior maxillary antrostomies. Tiny mucous retention cysts or small polyps in the maxillary sinuses. Other: None IMPRESSION: No CT evidence for acute intracranial abnormality. Electronically Signed   By: Donavan Foil M.D.   On: 07/01/2016 23:03   Ct Chest W Contrast  Result Date: 07/02/2016 CLINICAL DATA:  71 year old male with weight loss, night sweats, and abnormal chest x-ray. EXAM: CT CHEST WITH CONTRAST TECHNIQUE: Multidetector CT imaging of the chest was performed during intravenous contrast administration.  CONTRAST:  35mL ISOVUE-300 IOPAMIDOL (ISOVUE-300) INJECTION 61% COMPARISON:  Chest radiograph dated 07/01/2016 FINDINGS: Cardiovascular: Top-normal cardiac size. No pericardial effusion. The thoracic aorta is tortuous. There is mild dilatation of the ascending aorta measuring up to 4.4 cm in diameter. No aortic dissection. The origins of the great vessels of the aortic arch appear patent. The central pulmonary arteries are grossly unremarkable. Evaluation of the pulmonary arteries is limited due to suboptimal opacification and timing of the contrast. Mediastinum/Nodes: There is no hilar or mediastinal adenopathy. The esophagus and the thyroid gland are grossly unremarkable. Lungs/Pleura: There is mild elevation of the left hemidiaphragm with linear left lung base atelectasis/ scarring. Pneumonia is less likely. There is no pleural effusion or pneumothorax. The central airways are patent. Upper Abdomen: There is apparent fatty infiltration of the liver. A 2.1 cm hypodense lesion in the caudate lobe is not well characterized but may represent a cyst or hemangioma. There is a 2.5 cm indeterminate right adrenal hypodense nodule, most likely an adenoma. The visualized upper abdomen is otherwise unremarkable. Musculoskeletal: Mild degenerative changes of spine. No acute osseous pathology. IMPRESSION: 1. No acute intrathoracic pathology. 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 Recommend annual imaging followup by CTA or MRA. This recommendation follows 2010 ACCF/AHA/AATS/ACR/ASA/SCA/SCAI/SIR/STS/SVM Guidelines for the Diagnosis and Management of Patients with Thoracic Aortic Disease. Circulation. 2010; 121: K349-Z791 2. Fatty liver. A small hypodense lesion in the caudate lobe is indeterminate but likely represents a cyst or hemangioma. 3. Probable right adrenal adenoma. Electronically Signed   By: Anner Crete M.D.   On: 07/02/2016 01:14     LOS: 6 days   Oren Binet, MD  [facility] Pager:[phone]  If 7PM-7AM, please contact night-coverage [email or web address] Password [facility] 07/08/2016, 12:40 PM

## 2016-07-09 ENCOUNTER — Other Ambulatory Visit: Payer: Self-pay | Admitting: Cardiology

## 2016-07-09 ENCOUNTER — Other Ambulatory Visit: Payer: Self-pay | Admitting: Student

## 2016-07-09 DIAGNOSIS — I441 Atrioventricular block, second degree: Secondary | ICD-10-CM

## 2016-07-09 DIAGNOSIS — I440 Atrioventricular block, first degree: Secondary | ICD-10-CM

## 2016-07-09 LAB — CULTURE, BLOOD (ROUTINE X 2)
CULTURE: NO GROWTH
Culture: NO GROWTH

## 2016-07-09 LAB — CREATININE, SERUM
Creatinine, Ser: 0.98 mg/dL (ref 0.61–1.24)
GFR calc non Af Amer: >60 mL/min (ref >60)

## 2016-07-09 MED ORDER — CEFTRIAXONE IV (FOR PTA / DISCHARGE USE ONLY): 2.0000 g | INTRAVENOUS | 0 refills | Status: DC

## 2016-07-09 MED ORDER — HEPARIN SOD (PORK) LOCK FLUSH 100 UNIT/ML IV SOLN
250.0000 [IU] | Freq: Once | INTRAVENOUS | Status: AC
  Administered 2016-07-09: 250 [IU] via INTRAVENOUS
  Filled 2016-07-09 (×3): qty 2.5

## 2016-07-09 NOTE — Progress Notes (Signed)
PHARMACY CONSULT NOTE FOR:  OUTPATIENT  PARENTERAL ANTIBIOTIC THERAPY (OPAT)  Indication: Endocarditis Regimen: Rocephin 2g IV q24h x 4 wks End date: 08/06/2016  IV antibiotic discharge orders are pended. To discharging provider:  please sign these orders via discharge navigator,  Select New Orders & click on the button choice - Manage This Unsigned Work.     Thank you for allowing pharmacy to be a part of this patient's care.   Charma Mocarski S. Alford Highland, PharmD, [place] Clinical Staff Pharmacist Pager [phone]  Eilene Ghazi [facility] 07/09/2016, 10:55 AM

## 2016-07-09 NOTE — Care Management Important Message (Signed)
Important Message  Patient Details  Name: Charles [name] MRN: [id number] Date of Birth: [birth year]   Medicare Important Message Given:  Yes    Nathen May 07/09/2016, 3:32 PM

## 2016-07-09 NOTE — Discharge Summary (Addendum)
PATIENT DETAILS Name: Charles [name] Age: 71 y.o. Sex: male Date of Birth: [birth year] MRN: [id number]. Admitting Physician: Bethena Roys, MD KXF:GHWE [place] Crane, MD  Admit Date: 07/02/2016 Discharge date: 07/09/2016  Recommendations for Outpatient Follow-up:  1. Follow up with PCP in 1-2 weeks 2. Please follow labs-see instructions below in the med list.  3. Please ensure follow up with ID, Cardiology, Dentistry and CTVS 4. Will need annual imaging-ascending aorta 4.4 cm 5. Has a indeterminate 2.5 cm possible adrenal nodule-further workup deferred to patient's PCP.  Admitted From:  Home  Disposition: Home with home health services   Home Health: Yes  Equipment/Devices: PICC line--placed 3/26   Discharge Condition: Stable  CODE STATUS: FULL CODE  Diet recommendation:  Heart Healthy  Brief Summary: See H&P, Labs, Consult and Test reports for all details in brief, Patient is a 71 y.o. 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 See below for further details.  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 Underwent TEE on 3/27-although no evidence of obvious vegetation, but given clinical picture-I likelihood that this patient has endocarditis. TEE showing severe AR. Recommendations from infectious disease are to continue with Rocephin on discharge with a stop date of 4/26. Home health services have been arranged, patient will need close follow-up with infectious disease, cardiology and cardiothoracic surgery on discharge. Please follow labs (see below-med list) while on antibiotics.   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 Spoke with cardiology-Dr. Martin[place], cardiology will arrange for  placement of an event monitor on discharge, okay to discharge from his point of view. Patient instructed to seek immediate medical attention if he has a syncopal or syncopal episode.   Elevated Troponin:?etiology-does have hx of occasional chest pain with mostly atypical features, however does have hypokinesis of apical inferior wall. Given his of AS-likely that he could have underlying CAD or could have septic embolic to his coronaries. Cardiology followed closely throughout this hospital stay, with plans for [facility] prior to aortic valve replacement. Follow-up appointment already arranged for event monitor and for post hospital discharge.  Questionable periapical radiolucencies associated with tooth numbers 2, 3, 4, 14, and 29: Seen by dentistry, plans are for outpatient evaluation. Patient encouraged to keep his appointment with dentist-see below.  Anemia: probably secondary to acute illness-but apparently has Fe def anemia at baseline. Restart Fe supplementation. Per patient he has not had a screening colonoscopy-he will need to do in the outpatient setting.  Hx of Biscuspic aortic valve: Known history of bicuspid aortic valve-TEE showed severe AR. Followed by the cardiothoracic surgery during this hospital stay, plans are to pursue further workup in the outpatient setting and probably pursue valve replacement once he completes IV antimicrobial therapy, and necessary tooth extraction.  Mildly dilated ascending aorta measuring up to 4.4 cm in diameter: seen on CT Chest-likely related to Biscuspid aortic valve-will need annual imaging  Severe malnutrition: in context of acute illness/injury-continue supplements  2.5 cm indeterminate right adrenal nodule: Further workup if  her to the outpatient setting-seen incidentally on a CT scan of the abdomen. Patient aware of This  finding, I have encouraged him to seek follow-up with his PCP.  Obesity unspecified  Procedures/Studies: TEE 3/27 1. Moderately dilated LV with normal systolic function.  2. Bicuspid aortic valve with severe eccentric aortic insufficiency.  No definite infection seen.  3. Moderately dilated aortic root and ascending aorta.   Discharge Diagnoses:  Principal Problem:   Streptococcal endocarditis Active Problems:   Anemia   H/O cardiac murmur   Ascending aortic aneurysm (HCC)   Elevated Troponin   Protein-calorie malnutrition, severe   Bicuspid aortic valve   Aortic insufficiency   Weight loss   Second degree AV block, Mobitz type I   Discharge Instructions:  Activity:  As tolerated with Full fall precautions use walker/cane & assistance as needed  Discharge Instructions    Call MD for:  persistant dizziness or light-headedness    Complete by:  As directed    Diet - low sodium heart healthy    Complete by:  As directed    Home infusion instructions [facility] May follow [place] Dosing Protocol; May administer Cathflo as needed to maintain patency of vascular access device.; Flushing of vascular access device: per [facility] Protocol: 0.9% NaCl pre/post medica...    Complete by:  As directed    Instructions:  May follow [place] Dosing Protocol   Instructions:  May administer Cathflo as needed to maintain patency of vascular access device.   Instructions:  Flushing of vascular access device: per [facility] Protocol: 0.9% NaCl pre/post medication administration and prn patency; Heparin 100 u/ml, 53m for implanted ports and Heparin 10u/ml, 548mfor all other central venous catheters.   Instructions:  May follow [facility] Anaphylaxis Protocol for First Dose Administration in the home: 0.9% NaCl at 25-50 ml/hr to maintain IV access for protocol meds. Epinephrine 0.3 ml IV/IM PRN and  Benadryl 25-50 IV/IM PRN s/s of anaphylaxis.   Instructions:  [facility] [place] Coordinator (RN) to assist per patient IV care needs in the home PRN.   Increase activity slowly    Complete by:  As directed      Allergies as of 07/09/2016   No Known Allergies     Medication List    TAKE these medications   cefTRIAXone IVPB Commonly known as:  ROCEPHIN Inject 2 g into the vein daily. Indication:  Endocarditis Last Day of Therapy:  08/06/2016 Labs - Once weekly:  CBC/D and BMP, Labs - Every other week:  ESR and CRP   ENSURE PLUS Liqd Take 237 mLs by mouth daily.   ferrous sulfate 325 (65 FE) MG tablet Take 325 mg by mouth daily with breakfast.   multivitamin with minerals Tabs tablet Take 1 tablet by mouth daily.            Home Infusion Instuctions        Start     Ordered   07/09/16 0000  Home infusion instructions [facility] May follow [facility] Protocol; May administer Cathflo as needed to maintain patency of vascular access device.; Flushing of vascular access [facility]: 0.9% NaCl pre/post medica...    Question Answer Comment  Instructions May follow [facility] Protocol   Instructions May administer Cathflo as needed to maintain patency of vascular access device.   Instructions Flushing of vascular access device: [facility]: 0.9% NaCl pre/post medication administration and prn patency; Heparin 100 u/ml, 67m367mor implanted ports and Heparin 10u/ml, 67ml36mr all other central venous catheters.   Instructions May follow  [facility] Anaphylaxis Protocol for First Dose Administration in the home: 0.9% NaCl at 25-50 ml/hr to maintain IV access for protocol meds. Epinephrine 0.3 ml IV/IM PRN and Benadryl 25-50 IV/IM PRN s/s of anaphylaxis.   Instructions [facility] Infusion Coordinator (RN) to assist per patient IV care needs in the home PRN.      07/09/16 1123     Follow-up Information    [facility]  Follow up.   Why:  Registered Nurse and Physical Therapy Contact information: [address] [place] [address] [phone]        [facility] - [facility] Follow up.   Why:  IV Pump/Equipment for IV antibiotics.  Contact information: [address] [place] [address] [phone]        Teena Dunk, DDS. Go on 07/14/2016.   Specialty:  Dentistry Why:  July 14, 2016   at 10:00 am. Blanket information: [place] [id number] [phone]        [facility] Follow up on 07/13/2016.   Specialty:  Cardiology Why:  Appointment for event monitor on 07/13/2016 at 3:00PM.  Contact information: [address] [place] [phone]       Rosaria Ferries, PA-C Follow up on 07/21/2016.   Specialties:  Cardiology, Radiology Why:  Cardiology Hosptial Follow-Up on 07/21/2016 at 8:00AM. (Dr. Rosezella [place] PA) Contact information: [place] [address] [phone]        [name], MD Follow up on 08/17/2016.   Specialty:  Cardiothoracic Surgery Why:  appointment at 2 pm Contact information: [place] [address] [phone]        Michel Bickers, MD Follow up.   Specialty:  Infectious Diseases Why:  office will call you for a appointment Contact information: [address] [place] [address] [phone]          No Known Allergies  Consultations:   cardiology, ID and Dentistry, CTVS   Other Procedures/Studies: Dg Orthopantogram  Result Date: 07/05/2016 CLINICAL DATA:  Congestive heart failure. History of dental work. Denies jaw pain. EXAM: ORTHOPANTOGRAM/PANORAMIC COMPARISON:  None. FINDINGS: Several root canal procedures have been performed. Surrounding a mandibular RIGHT premolar tooth, there is a periapical lucency (arrow). Several teeth are also  missing. IMPRESSION: Query periodontal disease affecting a RIGHT mandibular premolar. Electronically Signed   By: Staci Righter M.D.   On: 07/05/2016 09:07   Dg Chest 2 View  Result Date: 07/05/2016 CLINICAL DATA:  Patient with history of congestive heart failure. EXAM: CHEST  2 VIEW COMPARISON:  Chest radiograph 07/01/2016; chest CT 07/02/2016. FINDINGS: Monitoring leads overlie the patient. Stable prominent cardiac and mediastinal contours with tortuosity of the thoracic aorta. Minimal heterogeneous opacities lung bases bilaterally. Stable biapical pleuroparenchymal thickening. Lateral view limited due to overlapping soft tissue. Thoracic spine degenerative changes. IMPRESSION: Tortuous aorta. Aortic atherosclerosis. Basilar atelectasis. Electronically Signed   By: Lovey Newcomer M.D.   On: 07/05/2016 08:12   Dg Chest 2 View  Result Date: 07/01/2016 CLINICAL DATA:  71 year old male with night sweats and weight loss. EXAM: CHEST  2 VIEW COMPARISON:  Chest radiograph dated 05/01/2016 FINDINGS: The lungs are clear. There is no pleural effusion or pneumothorax. The cardiac silhouette is within normal limits. The aorta is tortuous. There is prominence of the aortic root on the lateral projection which may be  the artifactual and related to tortuosity. Aneurysmal dilatation of the ascending aorta or aortic root is not excluded. CT of the chest with contrast may provide better evaluation. No acute osseous pathology identified. IMPRESSION: 1. No acute cardiopulmonary process. 2. Tortuous aorta with dilated appearance of the ascending aorta/aortic root. CT with contrast may provide better evaluation. Electronically Signed   By: Anner Crete M.D.   On: 07/01/2016 23:21   Ct Head Wo Contrast  Result Date: 07/01/2016 CLINICAL DATA:  Fall in December not feeling well EXAM: CT HEAD WITHOUT CONTRAST TECHNIQUE: Contiguous axial images were obtained from the base of the skull through the vertex without intravenous  contrast. COMPARISON:  None. FINDINGS: Brain: No acute territorial infarction, intracranial hemorrhage or focal mass lesion is visualized. The ventricles are nonenlarged. Mild atrophy. Vascular: No hyperdense vessels. Scattered calcifications at the carotid siphons. Skull: Normal. Negative for fracture or focal lesion. Sinuses/Orbits: Minimal mucosal thickening in the ethmoid sinuses. Prior maxillary antrostomies. Tiny mucous retention cysts or small polyps in the maxillary sinuses. Other: None IMPRESSION: No CT evidence for acute intracranial abnormality. Electronically Signed   By: Donavan Foil M.D.   On: 07/01/2016 23:03   Ct Chest W Contrast  Result Date: 07/02/2016 CLINICAL DATA:  71 year old male with weight loss, night sweats, and abnormal chest x-ray. EXAM: CT CHEST WITH CONTRAST TECHNIQUE: Multidetector CT imaging of the chest was performed during intravenous contrast administration. CONTRAST:  39m ISOVUE-300 IOPAMIDOL (ISOVUE-300) INJECTION 61% COMPARISON:  Chest radiograph dated 07/01/2016 FINDINGS: Cardiovascular: Top-normal cardiac size. No pericardial effusion. The thoracic aorta is tortuous. There is mild dilatation of the ascending aorta measuring up to 4.4 cm in diameter. No aortic dissection. The origins of the great vessels of the aortic arch appear patent. The central pulmonary arteries are grossly unremarkable. Evaluation of the pulmonary arteries is limited due to suboptimal opacification and timing of the contrast. Mediastinum/Nodes: There is no hilar or mediastinal adenopathy. The esophagus and the thyroid gland are grossly unremarkable. Lungs/Pleura: There is mild elevation of the left hemidiaphragm with linear left lung base atelectasis/ scarring. Pneumonia is less likely. There is no pleural effusion or pneumothorax. The central airways are patent. Upper Abdomen: There is apparent fatty infiltration of the liver. A 2.1 cm hypodense lesion in the caudate lobe is not well characterized  but may represent a cyst or hemangioma. There is a 2.5 cm indeterminate right adrenal hypodense nodule, most likely an adenoma. The visualized upper abdomen is otherwise unremarkable. Musculoskeletal: Mild degenerative changes of spine. No acute osseous pathology. IMPRESSION: 1. No acute intrathoracic pathology. 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 Recommend annual imaging followup by CTA or MRA. This recommendation follows 2010 ACCF/AHA/AATS/ACR/ASA/SCA/SCAI/SIR/STS/SVM Guidelines for the Diagnosis and Management of Patients with Thoracic Aortic Disease. Circulation. 2010; 121: eR518-A4162. Fatty liver. A small hypodense lesion in the caudate lobe is indeterminate but likely represents a cyst or hemangioma. 3. Probable right adrenal adenoma. Electronically Signed   By: AAnner CreteM.D.   On: 07/02/2016 01:14     TODAY-DAY OF DISCHARGE:  Subjective:   Charles [name] has no headache,no chest abdominal pain,no new weakness tingling or numbness, feels much better wants to go home today.  Objective:   Blood pressure 132/60, pulse 73, temperature 97.8 F (36.6 C), temperature source Oral, resp. rate 15, height 6' (1.829 m), weight 105.8 kg (233 lb 4.8 oz), SpO2 97 %.  Intake/Output Summary (Last 24 hours) at 07/09/16 1139 Last data filed at 07/09/16  0840  Gross per 24 hour  Intake             1867 ml  Output             2100 ml  Net             -233 ml   Filed Weights   07/07/16 1130 07/08/16 0500 07/09/16 0537  Weight: 107.5 kg (237 lb 1.6 oz) 106.6 kg (235 lb 1.6 oz) 105.8 kg (233 lb 4.8 oz)    Exam: Awake Alert, Oriented *3, No new F.N deficits, Normal affect [place].AT,PERRAL Supple Neck,No JVD, No cervical lymphadenopathy appriciated.  Symmetrical Chest wall movement, Good air movement bilaterally, CTAB RRR,No Gallops,Rubs or new Murmurs, No Parasternal Heave +ve B.Sounds, Abd Soft, Non tender, No organomegaly appriciated, No rebound -guarding or  rigidity. No Cyanosis, Clubbing or edema, No new Rash or bruise   PERTINENT RADIOLOGIC STUDIES: Dg Orthopantogram  Result Date: 07/05/2016 CLINICAL DATA:  Congestive heart failure. History of dental work. Denies jaw pain. EXAM: ORTHOPANTOGRAM/PANORAMIC COMPARISON:  None. FINDINGS: Several root canal procedures have been performed. Surrounding a mandibular RIGHT premolar tooth, there is a periapical lucency (arrow). Several teeth are also missing. IMPRESSION: Query periodontal disease affecting a RIGHT mandibular premolar. Electronically Signed   By: Staci Righter M.D.   On: 07/05/2016 09:07   Dg Chest 2 View  Result Date: 07/05/2016 CLINICAL DATA:  Patient with history of congestive heart failure. EXAM: CHEST  2 VIEW COMPARISON:  Chest radiograph 07/01/2016; chest CT 07/02/2016. FINDINGS: Monitoring leads overlie the patient. Stable prominent cardiac and mediastinal contours with tortuosity of the thoracic aorta. Minimal heterogeneous opacities lung bases bilaterally. Stable biapical pleuroparenchymal thickening. Lateral view limited due to overlapping soft tissue. Thoracic spine degenerative changes. IMPRESSION: Tortuous aorta. Aortic atherosclerosis. Basilar atelectasis. Electronically Signed   By: Lovey Newcomer M.D.   On: 07/05/2016 08:12   Dg Chest 2 View  Result Date: 07/01/2016 CLINICAL DATA:  71 year old male with night sweats and weight loss. EXAM: CHEST  2 VIEW COMPARISON:  Chest radiograph dated 05/01/2016 FINDINGS: The lungs are clear. There is no pleural effusion or pneumothorax. The cardiac silhouette is within normal limits. The aorta is tortuous. There is prominence of the aortic root on the lateral projection which may be the artifactual and related to tortuosity. Aneurysmal dilatation of the ascending aorta or aortic root is not excluded. CT of the chest with contrast may provide better evaluation. No acute osseous pathology identified. IMPRESSION: 1. No acute cardiopulmonary process.  2. Tortuous aorta with dilated appearance of the ascending aorta/aortic root. CT with contrast may provide better evaluation. Electronically Signed   By: Anner Crete M.D.   On: 07/01/2016 23:21   Ct Head Wo Contrast  Result Date: 07/01/2016 CLINICAL DATA:  Fall in December not feeling well EXAM: CT HEAD WITHOUT CONTRAST TECHNIQUE: Contiguous axial images were obtained from the base of the skull through the vertex without intravenous contrast. COMPARISON:  None. FINDINGS: Brain: No acute territorial infarction, intracranial hemorrhage or focal mass lesion is visualized. The ventricles are nonenlarged. Mild atrophy. Vascular: No hyperdense vessels. Scattered calcifications at the carotid siphons. Skull: Normal. Negative for fracture or focal lesion. Sinuses/Orbits: Minimal mucosal thickening in the ethmoid sinuses. Prior maxillary antrostomies. Tiny mucous retention cysts or small polyps in the maxillary sinuses. Other: None IMPRESSION: No CT evidence for acute intracranial abnormality. Electronically Signed   By: Donavan Foil M.D.   On: 07/01/2016 23:03   Ct Chest W Contrast  Result  Date: 07/02/2016 CLINICAL DATA:  71 year old male with weight loss, night sweats, and abnormal chest x-ray. EXAM: CT CHEST WITH CONTRAST TECHNIQUE: Multidetector CT imaging of the chest was performed during intravenous contrast administration. CONTRAST:  69m ISOVUE-300 IOPAMIDOL (ISOVUE-300) INJECTION 61% COMPARISON:  Chest radiograph dated 07/01/2016 FINDINGS: Cardiovascular: Top-normal cardiac size. No pericardial effusion. The thoracic aorta is tortuous. There is mild dilatation of the ascending aorta measuring up to 4.4 cm in diameter. No aortic dissection. The origins of the great vessels of the aortic arch appear patent. The central pulmonary arteries are grossly unremarkable. Evaluation of the pulmonary arteries is limited due to suboptimal opacification and timing of the contrast. Mediastinum/Nodes: There is no  hilar or mediastinal adenopathy. The esophagus and the thyroid gland are grossly unremarkable. Lungs/Pleura: There is mild elevation of the left hemidiaphragm with linear left lung base atelectasis/ scarring. Pneumonia is less likely. There is no pleural effusion or pneumothorax. The central airways are patent. Upper Abdomen: There is apparent fatty infiltration of the liver. A 2.1 cm hypodense lesion in the caudate lobe is not well characterized but may represent a cyst or hemangioma. There is a 2.5 cm indeterminate right adrenal hypodense nodule, most likely an adenoma. The visualized upper abdomen is otherwise unremarkable. Musculoskeletal: Mild degenerative changes of spine. No acute osseous pathology. IMPRESSION: 1. No acute intrathoracic pathology. 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 Recommend annual imaging followup by CTA or MRA. This recommendation follows 2010 ACCF/AHA/AATS/ACR/ASA/SCA/SCAI/SIR/STS/SVM Guidelines for the Diagnosis and Management of Patients with Thoracic Aortic Disease. Circulation. 2010; 121: [id number]. Fatty liver. A small hypodense lesion in the caudate lobe is indeterminate but likely represents a cyst or hemangioma. 3. Probable right adrenal adenoma. Electronically Signed   By: AAnner CreteM.D.   On: 07/02/2016 01:14     PERTINENT LAB RESULTS: CBC:  Recent Labs  07/07/16 0500  WBC 8.2  HGB 8.3*  HCT 26.7*  PLT 252   CMET CMP     Component Value Date/Time   NA 138 07/08/2016 0510   K 4.3 07/08/2016 0510   CL 104 07/08/2016 0510   CO2 25 07/08/2016 0510   GLUCOSE 110 (H) 07/08/2016 0510   BUN 14 07/08/2016 0510   CREATININE 0.98 07/09/2016 0545   CALCIUM 8.6 (L) 07/08/2016 0510   PROT 6.2 (L) 07/05/2016 0300   ALBUMIN 2.2 (L) 07/05/2016 0300   AST 16 07/05/2016 0300   ALT 17 07/05/2016 0300   ALKPHOS 82 07/05/2016 0300   BILITOT 0.3 07/05/2016 0300   GFRNONAA >60 07/09/2016 0545   GFRAA >60 07/09/2016 0545     GFR Estimated Creatinine Clearance: 90.7 mL/min (by C-G formula based on SCr of 0.98 mg/dL). No results for input(s): LIPASE, AMYLASE in the last 72 hours. No results for input(s): CKTOTAL, CKMB, CKMBINDEX, TROPONINI in the last 72 hours. Invalid input(s): POCBNP No results for input(s): DDIMER in the last 72 hours. No results for input(s): HGBA1C in the last 72 hours. No results for input(s): CHOL, HDL, LDLCALC, TRIG, CHOLHDL, LDLDIRECT in the last 72 hours. No results for input(s): TSH, T4TOTAL, T3FREE, THYROIDAB in the last 72 hours.  Invalid input(s): FREET3 No results for input(s): VITAMINB12, FOLATE, FERRITIN, TIBC, IRON, RETICCTPCT in the last 72 hours. Coags: No results for input(s): INR in the last 72 hours.  Invalid input(s): PT Microbiology: Recent Results (from the past 240 hour(s))  Blood culture (routine x 2)     Status: Abnormal   Collection Time: 07/02/16  12:08 AM  Result Value Ref Range Status   Specimen Description BLOOD RIGHT HAND  Final   Special Requests BOTTLES DRAWN AEROBIC AND ANAEROBIC [place]  Final   Culture  Setup Time   Final    GRAM POSITIVE COCCI Gram Stain Report Called to,Read Back By and Verified With: MINTER,R ON 07/02/16 '@1515'$  BY MITCHELL,S IN BOTH AEROBIC AND ANAEROBIC BOTTLES    Culture (A)  Final    VIRIDANS STREPTOCOCCUS SUSCEPTIBILITIES PERFORMED ON PREVIOUS CULTURE WITHIN THE LAST 5 DAYS. Performed at [facility], [place] [address] [place], [place] [address]    Report Status 07/04/2016 FINAL  Final  Blood culture (routine x 2)     Status: Abnormal   Collection Time: 07/02/16 12:17 AM  Result Value Ref Range Status   Specimen Description BLOOD RIGHT ARM  Final   Special Requests BOTTLES DRAWN AEROBIC AND ANAEROBIC [place]  Final   Culture  Setup Time   Final    GRAM POSITIVE COCCI Gram Stain Report Called to,Read Back By and Verified With: BECCA MINTER RN @ 7371 ON 3.22.18 BY BAYSE,L IN BOTH AEROBIC AND ANAEROBIC BOTTLES CRITICAL  RESULT CALLED TO, READ BACK BY AND VERIFIED WITH: T BULLOCK,RN '@0041'$  07/03/16 MKELLY,MLT Performed at [facility], [place] [address], [place], [place] [address]    Culture VIRIDANS STREPTOCOCCUS (A)  Final   Report Status 07/04/2016 FINAL  Final   Organism ID, Bacteria VIRIDANS STREPTOCOCCUS  Final      Susceptibility   Viridans streptococcus - MIC*    PENICILLIN <=0.06 SENSITIVE Sensitive     CEFTRIAXONE <=0.12 SENSITIVE Sensitive     ERYTHROMYCIN 2 RESISTANT Resistant     LEVOFLOXACIN 1 SENSITIVE Sensitive     VANCOMYCIN 0.5 SENSITIVE Sensitive     * VIRIDANS STREPTOCOCCUS  Blood Culture ID Panel (Reflexed)     Status: Abnormal   Collection Time: 07/02/16 12:17 AM  Result Value Ref Range Status   Enterococcus species NOT DETECTED NOT DETECTED Final   Listeria monocytogenes NOT DETECTED NOT DETECTED Final   Staphylococcus species NOT DETECTED NOT DETECTED Final   Staphylococcus aureus NOT DETECTED NOT DETECTED Final   Streptococcus species DETECTED (A) NOT DETECTED Final    Comment: Not Enterococcus species, Streptococcus agalactiae, Streptococcus pyogenes, or Streptococcus pneumoniae. CRITICAL RESULT CALLED TO, READ BACK BY AND VERIFIED WITH: T BULLOCK,RN '@0041'$  07/03/16 MKELLY,MLT    Streptococcus agalactiae NOT DETECTED NOT DETECTED Final   Streptococcus pneumoniae NOT DETECTED NOT DETECTED Final   Streptococcus pyogenes NOT DETECTED NOT DETECTED Final   Acinetobacter baumannii NOT DETECTED NOT DETECTED Final   Enterobacteriaceae species NOT DETECTED NOT DETECTED Final   Enterobacter cloacae complex NOT DETECTED NOT DETECTED Final   Escherichia coli NOT DETECTED NOT DETECTED Final   Klebsiella oxytoca NOT DETECTED NOT DETECTED Final   Klebsiella pneumoniae NOT DETECTED NOT DETECTED Final   Proteus species NOT DETECTED NOT DETECTED Final   Serratia marcescens NOT DETECTED NOT DETECTED Final   Haemophilus influenzae NOT DETECTED NOT DETECTED Final   Neisseria meningitidis  NOT DETECTED NOT DETECTED Final   Pseudomonas aeruginosa NOT DETECTED NOT DETECTED Final   Candida albicans NOT DETECTED NOT DETECTED Final   Candida glabrata NOT DETECTED NOT DETECTED Final   Candida krusei NOT DETECTED NOT DETECTED Final   Candida parapsilosis NOT DETECTED NOT DETECTED Final   Candida tropicalis NOT DETECTED NOT DETECTED Final    Comment: Performed at [facility], [place] [address], [place], [place] [address]  Culture, blood (Routine X 2) w Reflex to ID Panel     Status: Abnormal   Collection Time: 07/02/16  4:31 PM  Result Value Ref Range Status   Specimen Description RIGHT ANTECUBITAL  Final   Special Requests BOTTLES DRAWN AEROBIC AND ANAEROBIC 10 CC EACH  Final   Culture  Setup Time   Final    GRAM POSITIVE COCCI Gram Stain Report Called to,Read Back By and Verified With: JOHNSON B. AT [place] ON [id number] BY THOMPSON S. IN BOTH AEROBIC AND ANAEROBIC BOTTLES CRITICAL VALUE NOTED.  VALUE IS CONSISTENT WITH PREVIOUSLY REPORTED AND CALLED VALUE.    Culture (A)  Final    VIRIDANS STREPTOCOCCUS SUSCEPTIBILITIES PERFORMED ON PREVIOUS CULTURE WITHIN THE LAST 5 DAYS. Performed at [facility], [place] [address] [place], [place] [address]    Report Status 07/05/2016 FINAL  Final  Culture, blood (Routine X 2) w Reflex to ID Panel     Status: Abnormal   Collection Time: 07/02/16  4:34 PM  Result Value Ref Range Status   Specimen Description BLOOD RIGHT HAND  Final   Special Requests BOTTLES DRAWN AEROBIC AND ANAEROBIC 10CC EACH  Final   Culture  Setup Time   Final    GRAM POSITIVE COCCI Gram Stain Report Called to,Read Back By and Verified With: JOHNSON B. AT [place] ON [id number] BY THOMPSON S. IN BOTH AEROBIC AND ANAEROBIC BOTTLES CRITICAL VALUE NOTED.  VALUE IS CONSISTENT WITH PREVIOUSLY REPORTED AND CALLED VALUE.    Culture (A)  Final    VIRIDANS STREPTOCOCCUS SUSCEPTIBILITIES PERFORMED ON PREVIOUS CULTURE WITHIN THE LAST 5 DAYS. Performed at [place], [place] [address] [place], [place] [address]    Report Status 07/05/2016 FINAL  Final  Culture, blood (routine x 2)     Status: None (Preliminary result)   Collection Time: 07/04/16 12:48 PM  Result Value Ref Range Status   Specimen Description BLOOD LEFT HAND  Final   Special Requests IN PEDIATRIC BOTTLE 1.5CC  Final   Culture NO GROWTH 4 DAYS  Final   Report Status PENDING  Incomplete  Culture, blood (routine x 2)     Status: None (Preliminary result)   Collection Time: 07/04/16 12:55 PM  Result Value Ref Range Status   Specimen Description BLOOD LEFT HAND  Final   Special Requests IN PEDIATRIC BOTTLE 1CC  Final   Culture NO GROWTH 4 DAYS  Final   Report Status PENDING  Incomplete    FURTHER DISCHARGE INSTRUCTIONS:  Get Medicines reviewed and adjusted: Please take all your medications with you for your next visit with your Primary MD  Laboratory/radiological data: Please request your Primary MD to go over all hospital tests and procedure/radiological results at the follow up, please ask your Primary MD to get all Hospital records sent to his/her office.  In some cases, they will be blood work, cultures and biopsy results pending at the time of your discharge. Please request that your primary care M.D. goes through all the records of your hospital data and follows up on these results.  Also Note the following: If you experience worsening of your admission symptoms, develop shortness of breath, life threatening emergency, suicidal or homicidal thoughts you must seek medical attention immediately by calling 911 or calling your MD immediately  if symptoms less severe.  You must read complete instructions/literature along with all the possible adverse reactions/side effects for all the Medicines you take and that have been prescribed to you. Take any  new Medicines after you have completely understood and accpet all the possible adverse reactions/side effects.   Do not drive when taking  Pain medications or sleeping medications (Benzodaizepines)  Do not take more than prescribed Pain, Sleep and Anxiety Medications. It is not advisable to combine anxiety,sleep and pain medications without talking with your primary care practitioner  Special Instructions: If you have smoked or chewed Tobacco  in the last 2 yrs please stop smoking, stop any regular Alcohol  and or any Recreational drug use.  Wear Seat belts while driving.  Please note: You were cared for by a hospitalist during your hospital stay. Once you are discharged, your primary care physician will handle any further medical issues. Please note that NO REFILLS for any discharge medications will be authorized once you are discharged, as it is imperative that you return to your primary care physician (or establish a relationship with a primary care physician if you do not have one) for your post hospital discharge needs so that they can reassess your need for medications and monitor your lab values.  Total Time spent coordinating discharge including counseling, education and face to face time equals 45 minutes.  SignedOren Binet 07/09/2016 11:39 AM

## 2016-07-09 NOTE — Plan of Care (Signed)
Problem: Physical Regulation: Goal: Ability to maintain clinical measurements within normal limits will improve Outcome: Completed/Met Date Met: 07/09/16 Pt's symptoms of fever, fatigue & etc all improved prior to d/c. Goal: Will remain free from infection Outcome: Completed/Met Date Met: 07/09/16 Pt will be going home on IV abx  Problem: Education: Goal: Knowledge of [facility] Education information/materials will improve Outcome: Completed/Met Date Met: 07/09/16 Pt was educated on his diagnosis, f/u appointments, meds, pain mgmt, when to call md & etc. All questions were answered.

## 2016-07-09 NOTE — Progress Notes (Addendum)
Pt given discharge education on f/u appointments, meds, when to call MD & etc. Pt given his prescription. Pt educated on PICC care. Pt made sure to have all of his belongings. Pt's telemetry monitor was removed. Pt's PICC was hep locked prior to d/c. Hoover Brunette, RN

## 2016-07-09 NOTE — Plan of Care (Signed)
Problem: Education: Goal: Knowledge of  General Education information/materials will improve Outcome: Progressing Patient aware of plan of care.  RN provided medication education on all medications administered thus far this shift.  Patient stated understanding.     

## 2016-07-09 NOTE — Progress Notes (Signed)
Progress Note  Patient Name: Charles [name] Date of Encounter: 07/09/2016  Primary Cardiologist: New to Dr. Percival Spanish   Subjective    No chest pain or dyspnea. Feels very well. No dizziness or palpitations.  Inpatient Medications    Scheduled Meds: . atorvastatin  40 mg Oral q1800  . cefTRIAXone (ROCEPHIN)  IV  2 g Intravenous Q24H  . enoxaparin (LOVENOX) injection  40 mg Subcutaneous Q24H  . feeding supplement  1 Container Oral TID BM  . feeding supplement (ENSURE ENLIVE)  237 mL Oral BID BM  . feeding supplement (PRO-STAT SUGAR FREE 64)  30 mL Oral BID  . ferrous sulfate  325 mg Oral Q breakfast  . sodium chloride flush  10-40 mL Intracatheter Q12H  . sodium chloride flush  3 mL Intravenous Q12H   Continuous Infusions:  PRN Meds: acetaminophen **OR** acetaminophen, ondansetron **OR** ondansetron (ZOFRAN) IV, senna-docusate, sodium chloride flush   Vital Signs    Vitals:   07/08/16 1453 07/08/16 2123 07/09/16 0537 07/09/16 0544  BP: (!) 121/59 (!) 118/46  132/60  Pulse: 81 73  73  Resp: 15     Temp: 98.2 F (36.8 C) 98.3 F (36.8 C)  97.8 F (36.6 C)  TempSrc: Oral Oral  Oral  SpO2: 98% 97%  97%  Weight:   233 lb 4.8 oz (105.8 kg)   Height:        Intake/Output Summary (Last 24 hours) at 07/09/16 0950 Last data filed at 07/09/16 0840  Gross per 24 hour  Intake             1867 ml  Output             2100 ml  Net             -233 ml   Filed Weights   07/07/16 1130 07/08/16 0500 07/09/16 0537  Weight: 237 lb 1.6 oz (107.5 kg) 235 lb 1.6 oz (106.6 kg) 233 lb 4.8 oz (105.8 kg)    Telemetry    Sinus rhythm with prolonged PR and intermittent second degree AV block (Wenckebach block). No significant pauses or bradycardia.  Personally Reviewed  ECG    None today   Physical Exam   GEN: No acute distress.   Neck: No JVD Cardiac: RRR, 2/6 systolic and diastolic murmurs, rubs, or gallops.  Respiratory: Clear to auscultation bilaterally. GI: Soft,  nontender, non-distended  MS: No edema; No deformity. Neuro:  Nonfocal  Psych: Normal affect   Labs    Chemistry  Recent Labs Lab 07/05/16 0300 07/06/16 0502 07/07/16 0500 07/08/16 0510 07/09/16 0545  NA 138 140 137 138  --   K 4.2 4.3 3.9 4.3  --   CL 106 106 104 104  --   CO2 23 25 25 25   --   GLUCOSE 101* 103* 100* 110*  --   BUN 19 17 14 14   --   CREATININE 1.02 0.97 0.91 1.03 0.98  CALCIUM 8.3* 8.6* 8.3* 8.6*  --   PROT 6.2*  --   --   --   --   ALBUMIN 2.2*  --   --   --   --   AST 16  --   --   --   --   ALT 17  --   --   --   --   ALKPHOS 82  --   --   --   --   BILITOT 0.3  --   --   --   --  GFRNONAA >60 >60 >60 >60 >60  GFRAA >60 >60 >60 >60 >60  ANIONGAP 9 9 8 9   --      Hematology  Recent Labs Lab 07/03/16 0715 07/04/16 1248 07/05/16 0300 07/07/16 0500  WBC 12.9*  --  8.0 8.2  RBC 3.42* 3.79* 3.39* 3.31*  HGB 8.5*  --  8.5* 8.3*  HCT 27.1*  --  27.4* 26.7*  MCV 79.2  --  80.8 80.7  MCH 24.9*  --  25.1* 25.1*  MCHC 31.4  --  31.0 31.1  RDW 16.8*  --  17.2* 17.4*  PLT 237  --  246 252    Cardiac Enzymes  Recent Labs Lab 07/02/16 1954 07/03/16 0117 07/03/16 0715  TROPONINI 0.89* 1.01* 0.78*   No results for input(s): TROPIPOC in the last 168 hours.   BNPNo results for input(s): BNP, PROBNP in the last 168 hours.   DDimer No results for input(s): DDIMER in the last 168 hours.   Radiology    No results found.  Cardiac Studies   Echo: 07/03/16: Study Conclusions  - Left ventricle: The cavity size was mildly dilated. Wall thickness was increased in a pattern of mild LVH. Systolic function was normal. The estimated ejection fraction was 55%. Probable hypokinesis of the apicalinferior myocardium. Doppler parameters are consistent with abnormal left ventricular relaxation (grade 1 diastolic dysfunction). Doppler parameters are consistent with high ventricular filling pressure. - Aortic valve: Bicuspid; moderately  thickened leaflets. Intermittently noted small echodensities seen near leaflet tips in the parasternal views, although not well in other views. Suspicious for vegetations particularly in light of bacteremia although TEE is indicated for further assessment, and also to exclude any associated abscess in light of fairly peripheral aortic regurgitation. There was moderate regurgitation. Mean gradient (S): 5 mm Hg. Peak gradient (S): 8 mm Hg. Regurgitation pressure half-time: 383 ms. - Aorta: Aortic root dimension: 50 mm (ED). - Aortic root: The aortic root was moderately dilated. - Mitral valve: Calcified annulus. There was mild regurgitation. - Right atrium: Echodensity noted near the septum and tricuspid valve plane, best seen in the apical views but not in the subcostal views. Suspect artifact or perhaps visualization of compressed free wall, although in light of bacteremia would suggest further imaging with TEE to exclude other pathology. Central venous pressure (est): 8 mm Hg. - Tricuspid valve: There was trivial regurgitation. - Pulmonary arteries: PA peak pressure: 28 mm Hg (S). - Pericardium, extracardiac: There was no pericardial effusion.  Impressions:  - Mildly dilated left ventricle with mild LVH and LVEF approximately 55%. Probable hypokinesis of the apical inferior wall. Grade 1 diastolic dysfunction with increased filling pressures. Calcified mitral annulus with mild mitral regurgitation. Aortic valve is bicuspid and moderately thickened. Intermittently noted small echodensities seen near leaflet tips in the parasternal views, although not well in other views. Suspicious for vegetations particularly in light of bacteremia although TEE is indicated for further assessment, and also to exclude any associated abscess in light of fairly peripheral aortic regurgitation. Aortic root is moderately dilated, measures up to 50 mm.  Trivial tricuspid regurgitation with PASP estimated 28 mmHg. Echodensity noted in right atrium near the septum and tricuspid valve plane, best seen in the apical views but not in the subcostal views. Suspect artifact or perhaps visualization of compressed free wall, although in light of bacteremia would suggest further imaging with TEE to exclude other pathology. Results discussed with Dr. Jerilee Hoh.  TEE 07/07/16: Procedure: TEE  Indication: Bacteremia, r/o endocarditis  Sedation:  Versed 2 mg IV, Fentanyl 50 mcg IV  Findings: Please see echo section for full report.  The left ventricle appeared moderately dilated with normal wall thickness.  EF 60-65%, normal wall motion.  Normal right ventricular size and systolic function.  Trivial TR, no vegetation.  No PV vegetation.  Normal left and right atrial sizes, no LA thrombus noted.  No PFO or ASD noted by color doppler.  There was mild mitral regurgitation, no MV vegetation.  The aortic valve was bicuspid.  There was eccentric aortic insufficiency that appeared severe.  There was no aortic stenosis.  No definite vegetation seen.  There was a small area of thickening with possible potential space at the valve base adjacent to the RV.  The AI appeared to originate from this area.  However, I am not convinced this is a vegetation or abscess. The aortic root was dilated to to 4.7 cm with a 4.4 cm ascending aorta.  Diastolic flow reversal noted in the ascending thoracic aorta.   Impression:  1. Moderately dilated LV with normal systolic function.  2. Bicuspid aortic valve with severe eccentric aortic insufficiency.  No definite infection seen.  3. Moderately dilated aortic root and ascending aorta.   Loralie Champagne 07/07/2016 1:06 PM  Patient Profile     70 y.o. male with night sweats and weight loss. He was at [facility]. Cultures were drawn and he was sent home but was called back when his cultures became positive. He was found to  have strep Viridans bacteremia. He has a history of a heart murmur.   Assessment & Plan    1. Endocarditis- strep viridans  -  ID and CT surgery on board. On IV antibiotics. No fever.  Repeat blood cultures negative. Although TEE does not show definite vegetation or abscess I think clinically he has SBE of the valve with possible perforation of valve leaflet. Recommend  full 6 week course of antibiotics IV. No CHF or distal embolus.  Development of progressive PR interval prolongation and second degree AV block is concerning but stable without brady or long pauses. I would recommend DC today. Will arrange for an event monitor given AV block. Will need close followup in our office and will need to arrange [facility] prior to follow up with Dr. Roxy Manns in 4 weeks.   2. Bicuspid aortic valve - confirmed on TEE. Severe eccentric AI. Some LV dilation. Will eventually need AVR and Aortic root grafting.   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 Related to bicuspid valve.   4. Elevated troponin - Flat trend. No acute ST changes on EKG. Chest pain free. Will need cardiac cath prior to any surgical treatment of his bicuspid valve. No urgent indication currently.   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 Patient's PR interval was normal on admission. Now progressively prolonged with intermittent second degree AV block. He is on no rate slowing medication. Will arrange event monitor.   Signed,  Anzley Dibbern [place], [place] 07/09/2016 9:50 AM

## 2016-07-11 LAB — ECHO TEE: P 1/2 time: 277 ms

## 2016-07-13 ENCOUNTER — Encounter: Payer: Self-pay | Admitting: Student

## 2016-07-13 ENCOUNTER — Telehealth: Payer: Self-pay | Admitting: Cardiology

## 2016-07-13 ENCOUNTER — Ambulatory Visit (INDEPENDENT_AMBULATORY_CARE_PROVIDER_SITE_OTHER): Payer: Medicare Other | Admitting: Student

## 2016-07-13 ENCOUNTER — Ambulatory Visit (INDEPENDENT_AMBULATORY_CARE_PROVIDER_SITE_OTHER): Payer: Medicare Other

## 2016-07-13 ENCOUNTER — Telehealth: Payer: Self-pay | Admitting: Physician Assistant

## 2016-07-13 VITALS — BP 128/62 | HR 79 | Ht 72.0 in | Wt 237.0 lb

## 2016-07-13 DIAGNOSIS — I330 Acute and subacute infective endocarditis: Secondary | ICD-10-CM | POA: Diagnosis not present

## 2016-07-13 DIAGNOSIS — I5033 Acute on chronic diastolic (congestive) heart failure: Secondary | ICD-10-CM

## 2016-07-13 DIAGNOSIS — I440 Atrioventricular block, first degree: Secondary | ICD-10-CM

## 2016-07-13 DIAGNOSIS — R0789 Other chest pain: Secondary | ICD-10-CM

## 2016-07-13 DIAGNOSIS — B954 Other streptococcus as the cause of diseases classified elsewhere: Secondary | ICD-10-CM | POA: Diagnosis not present

## 2016-07-13 DIAGNOSIS — I351 Nonrheumatic aortic (valve) insufficiency: Secondary | ICD-10-CM

## 2016-07-13 DIAGNOSIS — I441 Atrioventricular block, second degree: Secondary | ICD-10-CM | POA: Diagnosis not present

## 2016-07-13 DIAGNOSIS — Q231 Congenital insufficiency of aortic valve: Secondary | ICD-10-CM

## 2016-07-13 DIAGNOSIS — B955 Unspecified streptococcus as the cause of diseases classified elsewhere: Secondary | ICD-10-CM

## 2016-07-13 DIAGNOSIS — Z5181 Encounter for therapeutic drug level monitoring: Secondary | ICD-10-CM | POA: Diagnosis not present

## 2016-07-13 MED ORDER — FUROSEMIDE 40 MG PO TABS: 40.0000 mg | ORAL_TABLET | Freq: Every day | ORAL | 2 refills | Status: DC

## 2016-07-13 NOTE — Telephone Encounter (Signed)
New message       Pt c/o of Chest Pain: STAT if CP now or developed within 24 hours  1. Are you having CP right now? Chest pain this am 2. Are you experiencing any other symptoms (ex. SOB, nausea, vomiting, sweating)?  +3 edema, pt was discharged from hosp last thurs or friday  3. How long have you been experiencing CP? First time since discharge from hosp 4. Is your CP continuous or coming and going?  Come and go ( Not lasting long) 5. Have you taken Nitroglycerin?  ?no

## 2016-07-13 NOTE — Telephone Encounter (Signed)
Returned call to patient. He was seen in clinic today and received his event monitor. He called stating he had 5/10 chest pain for 90 seconds about 1 hr after carrying in his suitcase from the car. The chest pain was substernal, did not radiate anywhere, and was not associated with any other symptoms. He denied nausea, vomiting, SOB, diaphoresis, and palpitations. I asked him to keep a log of all chest pain. I advised him to report to the ED if he continues to get intermittent chest pain. He will call the office in the morning to report these symptoms.   Tami Lin Elyshia Kumagai, PA-C 07/13/2016, 7:49 PM [phone]

## 2016-07-13 NOTE — Telephone Encounter (Signed)
Returned call to patient.Appointment scheduled with Bernerd Pho PA this afternoon at 1:30 pm at [facility] office.

## 2016-07-13 NOTE — Telephone Encounter (Signed)
Spoke with [facility] nurse/pt they state that this morning @930am  and yesterday pt had 30-45 sec's of chest pain and no other symptoms. also pt has 3+LE edema nurse states that she told pt to rest and elevate LE. Pt has appt today 3 pm at [place] office for monitor placement. What else should we do? Please advise.

## 2016-07-13 NOTE — Patient Instructions (Addendum)
Medication Instructions: START Lasix (Furosemide) 40 mg daily.  If you need a refill on your cardiac medications before your next appointment, please call your pharmacy.  Follow-Up: Your physician recommends that you schedule a follow-up appointment in: [place] with Jory Sims, NP on 07/23/16.  Your physician recommends that you schedule a follow-up appointment in: 3 weeks with Dr. Domenic Polite in [place].    Any Other Special Instructions will be listed below:  Potassium Content of Foods Potassium is a mineral found in many foods and drinks. It helps keep fluids and minerals balanced in your body and affects how steadily your heart beats. Potassium also helps control your blood pressure and keep your muscles and nervous system healthy. Certain health conditions and medicines may change the balance of potassium in your body. When this happens, you can help balance your level of potassium through the foods that you do or do not eat. Your health care provider or dietitian may recommend an amount of potassium that you should have each day. The following lists of foods provide the amount of potassium (in parentheses) per serving in each item. High in potassium The following foods and beverages have 200 mg or more of potassium per serving:  Apricots, 2 raw or 5 dry (200 mg).  Artichoke, 1 medium (345 mg).  Avocado, raw,  each (245 mg).  Banana, 1 medium (425 mg).  Beans, lima, or baked beans, canned,  cup (280 mg).  Beans, white, canned,  cup (595 mg).  Beef roast, 3 oz (320 mg).  Beef, ground, 3 oz (270 mg).  Beets, raw or cooked,  cup (260 mg).  Bran muffin, 2 oz (300 mg).  Broccoli,  cup (230 mg).  Brussels sprouts,  cup (250 mg).  Cantaloupe,  cup (215 mg).  Cereal, 100% bran,  cup (200-400 mg).  Cheeseburger, single, fast food, 1 each (225-400 mg).  Chicken, 3 oz (220 mg).  Clams, canned, 3 oz (535 mg).  Crab, 3 oz (225 mg).  Dates, 5 each (270  mg).  Dried beans and peas,  cup (300-475 mg).  Figs, dried, 2 each (260 mg).  Fish: halibut, tuna, cod, snapper, 3 oz (480 mg).  Fish: salmon, haddock, swordfish, perch, 3 oz (300 mg).  Fish, tuna, canned 3 oz (200 mg).  [place] fries, fast food, 3 oz (470 mg).  Granola with fruit and nuts,  cup (200 mg).  Grapefruit juice,  cup (200 mg).  Greens, beet,  cup (655 mg).  Honeydew melon,  cup (200 mg).  Kale, raw, 1 cup (300 mg).  Kiwi, 1 medium (240 mg).  Kohlrabi, rutabaga, parsnips,  cup (280 mg).  Lentils,  cup (365 mg).  Mango, 1 each (325 mg).  Milk, chocolate, 1 cup (420 mg).  Milk: nonfat, low-fat, whole, buttermilk, 1 cup (350-380 mg).  Molasses, 1 Tbsp (295 mg).  Mushrooms,  cup (280) mg.  Nectarine, 1 each (275 mg).  Nuts: almonds, peanuts, hazelnuts, [place], cashew, mixed, 1 oz (200 mg).  Nuts, pistachios, 1 oz (295 mg).  Orange, 1 each (240 mg).  Orange juice,  cup (235 mg).  Papaya, medium,  fruit (390 mg).  Peanut butter, chunky, 2 Tbsp (240 mg).  Peanut butter, smooth, 2 Tbsp (210 mg).  Pear, 1 medium (200 mg).  Pomegranate, 1 whole (400 mg).  Pomegranate juice,  cup (215 mg).  Pork, 3 oz (350 mg).  Potato chips, salted, 1 oz (465 mg).  Potato, baked with skin, 1 medium (925 mg).  Potatoes, boiled,  cup (255 mg).  Potatoes, mashed,  cup (330 mg).  Prune juice,  cup (370 mg).  Prunes, 5 each (305 mg).  Pudding, chocolate,  cup (230 mg).  Pumpkin, canned,  cup (250 mg).  Raisins, seedless,  cup (270 mg).  Seeds, sunflower or pumpkin, 1 oz (240 mg).  Soy milk, 1 cup (300 mg).  Spinach,  cup (420 mg).  Spinach, canned,  cup (370 mg).  Sweet potato, baked with skin, 1 medium (450 mg).  Swiss chard,  cup (480 mg).  Tomato or vegetable juice,  cup (275 mg).  Tomato sauce or puree,  cup (400-550 mg).  Tomato, raw, 1 medium (290 mg).  Tomatoes, canned,  cup (200-300 mg).  [place], 3 oz (250  mg).  Wheat germ, 1 oz (250 mg).  Winter squash,  cup (250 mg).  Yogurt, plain or fruited, 6 oz (260-435 mg).  Zucchini,  cup (220 mg). Moderate in potassium The following foods and beverages have 50-200 mg of potassium per serving:  Apple, 1 each (150 mg).  Apple juice,  cup (150 mg).  Applesauce,  cup (90 mg).  Apricot nectar,  cup (140 mg).  Asparagus, small spears,  cup or 6 spears (155 mg).  Bagel, cinnamon raisin, 1 each (130 mg).  Bagel, egg or plain, 4 in., 1 each (70 mg).  Beans, green,  cup (90 mg).  Beans, yellow,  cup (190 mg).  Beer, regular, 12 oz (100 mg).  Beets, canned,  cup (125 mg).  Blackberries,  cup (115 mg).  Blueberries,  cup (60 mg).  Bread, whole wheat, 1 slice (70 mg).  Broccoli, raw,  cup (145 mg).  Cabbage,  cup (150 mg).  Carrots, cooked or raw,  cup (180 mg).  Cauliflower, raw,  cup (150 mg).  Celery, raw,  cup (155 mg).  Cereal, bran flakes, cup (120-150 mg).  Cheese, cottage,  cup (110 mg).  Cherries, 10 each (150 mg).  Chocolate, 1 oz bar (165 mg).  Coffee, brewed 6 oz (90 mg).  Corn,  cup or 1 ear (195 mg).  Cucumbers,  cup (80 mg).  Egg, large, 1 each (60 mg).  Eggplant,  cup (60 mg).  Endive, raw, cup (80 mg).  English muffin, 1 each (65 mg).  Fish, orange roughy, 3 oz (150 mg).  Frankfurter, beef or pork, 1 each (75 mg).  Fruit cocktail,  cup (115 mg).  Grape juice,  cup (170 mg).  Grapefruit,  fruit (175 mg).  Grapes,  cup (155 mg).  Greens: kale, turnip, collard,  cup (110-150 mg).  Ice cream or frozen yogurt, chocolate,  cup (175 mg).  Ice cream or frozen yogurt, vanilla,  cup (120-150 mg).  Lemons, limes, 1 each (80 mg).  Lettuce, all types, 1 cup (100 mg).  Mixed vegetables,  cup (150 mg).  Mushrooms, raw,  cup (110 mg).  Nuts: walnuts, pecans, or macadamia, 1 oz (125 mg).  Oatmeal,  cup (80 mg).  Okra,  cup (110 mg).  Onions, raw,  cup  (120 mg).  Peach, 1 each (185 mg).  Peaches, canned,  cup (120 mg).  Pears, canned,  cup (120 mg).  Peas, green, frozen,  cup (90 mg).  Peppers, green,  cup (130 mg).  Peppers, red,  cup (160 mg).  Pineapple juice,  cup (165 mg).  Pineapple, fresh or canned,  cup (100 mg).  Plums, 1 each (105 mg).  Pudding, vanilla,  cup (150 mg).  Raspberries,  cup (90 mg).  Rhubarb,  cup (115 mg).  Rice, wild,  cup (80 mg).  Shrimp, 3 oz (155 mg).  Spinach, raw, 1 cup (170 mg).  Strawberries,  cup (125 mg).  Summer squash  cup (175-200 mg).  Swiss chard, raw, 1 cup (135 mg).  Tangerines, 1 each (140 mg).  Tea, brewed, 6 oz (65 mg).  Turnips,  cup (140 mg).  Watermelon,  cup (85 mg).  Wine, red, table, 5 oz (180 mg).  Wine, white, table, 5 oz (100 mg). Low in potassium The following foods and beverages have less than 50 mg of potassium per serving.  Bread, white, 1 slice (30 mg).  Carbonated beverages, 12 oz (less than 5 mg).  Cheese, 1 oz (20-30 mg).  Cranberries,  cup (45 mg).  Cranberry juice cocktail,  cup (20 mg).  Fats and oils, 1 Tbsp (less than 5 mg).  Hummus, 1 Tbsp (32 mg).  Nectar: papaya, mango, or pear,  cup (35 mg).  Rice, white or brown,  cup (50 mg).  Spaghetti or macaroni,  cup cooked (30 mg).  Tortilla, flour or corn, 1 each (50 mg).  Waffle, 4 in., 1 each (50 mg).  Water chestnuts,  cup (40 mg). This information is not intended to replace advice given to you by your health care provider. Make sure you discuss any questions you have with your health care provider. Document Released: 11/11/2004 Document Revised: 09/05/2015 Document Reviewed: 02/24/2013 Elsevier Interactive Patient Education  2017 [employer].

## 2016-07-13 NOTE — Progress Notes (Signed)
Cardiology Office Note    Date:  07/13/2016   ID:  Tami Barren, DOB 1945/07/14, MRN [id number]  PCP:  Wende Neighbors, MD  Cardiologist: Dr. Percival Spanish --> Wishes to Follow-Up in [place], [place]  Chief Complaint  Patient presents with  . Edema  . Chest Pain    History of Present Illness:    Charles [name] is a 71 y.o. male with past medical history of bicuspid aortic valve with severe AI, aortic root dilation, and recently diagnosed endocarditis who presents to the office as an add-on for evaluation of worsening chest pain and lower extremity edema.   He was admitted from 3/22 - 07/09/2016 for night sweats and weight loss, found to have strep viridans bacteremia. An echocardiogram was obtained and showed an EF of 55% with possible HK of the apical inferior myocardium and echo densities near the leaflet tips of the aortic valve. A TEE was performed which showed a bicuspid aortic valve with severe eccentric AI but no definitive infection noted. It was still thought that clinically he had SBE, therefore a full 6-week course of antibiotics was recommended. Throughout admission, he did have progressive PR interval prolongation, therefore an event monitor was recommended as an outpatient. The plan was for an outpatient cardiac catheterization with follow-up with CT Surgery in regards to AVR.   In talking with the patient today, he reports his home health nurse noted worsening lower extremity edema today on her visit. He denies any associated orthopnea or PND.  Also occurring this morning was an episode of chest pain which he describes as shooting pain along his left pectoral region which lasted for up to 30 seconds and spontaneously resolved. This occurred at rest and was not associated with exertion. This was the first episode of chest pain he has experienced since his hospitalization.   Past Medical History:  Diagnosis Date  . Anemia   . Aortic insufficiency    a. 07/07/2016: TEE showing bicuspid  aortic valve with severe eccentric AI   . Ascending aortic aneurysm (HCC)    4.6 cm  . Bacteremia 07/02/2016   Streptococcus viridans   . Bicuspid aortic valve     Past Surgical History:  Procedure Laterality Date  . HERNIA REPAIR     left inguinal  . TEE WITHOUT CARDIOVERSION N/A 07/07/2016   Procedure: TRANSESOPHAGEAL ECHOCARDIOGRAM (TEE);  Surgeon: Larey Dresser, MD;  Location: [facility] ENDOSCOPY;  Service: Cardiovascular;  Laterality: N/A;    Current Medications: Outpatient Medications Prior to Visit  Medication Sig Dispense Refill  . cefTRIAXone (ROCEPHIN) IVPB Inject 2 g into the vein daily. Indication:  Endocarditis Last Day of Therapy:  08/06/2016 Labs - Once weekly:  CBC/D and BMP, Labs - Every other week:  ESR and CRP 27 Units 0  . ENSURE PLUS (ENSURE PLUS) LIQD Take 237 mLs by mouth daily.    . ferrous sulfate 325 (65 FE) MG tablet Take 325 mg by mouth daily with breakfast.    . Multiple Vitamin (MULTIVITAMIN WITH MINERALS) TABS tablet Take 1 tablet by mouth daily.     No facility-administered medications prior to visit.      Allergies:   Patient has no known allergies.   Social History   Social History  . Marital status: Married    Spouse name: N/A  . Number of children: 3  . Years of education: N/A   Social History Main Topics  . Smoking status: Former Smoker    Types: Cigarettes  . Smokeless tobacco: Never  Used     Comment: Quit 30 years ago.    . Alcohol use No  . Drug use: No  . Sexual activity: Not Asked   Other Topics Concern  . None   Social History Narrative  . None     Family History:  The patient's family history includes Heart disease (age of onset: 73) in his father; Ovarian cancer in his mother.   Review of Systems:   Please see the history of present illness.     General:  No chills, fever, night sweats or weight changes.  Cardiovascular:  No dyspnea on exertion, orthopnea, palpitations, paroxysmal nocturnal dyspnea. Positive for lower  extremity edema and chest pain.  Dermatological: No rash, lesions/masses Respiratory: No cough, dyspnea Urologic: No hematuria, dysuria Abdominal:   No nausea, vomiting, diarrhea, bright red blood per rectum, melena, or hematemesis Neurologic:  No visual changes, wkns, changes in mental status. All other systems reviewed and are otherwise negative except as noted above.   Physical Exam:    VS:  BP 128/62   Pulse 79   Ht 6' (1.829 m)   Wt 237 lb (107.5 kg)   BMI 32.14 kg/m    General: Well developed, well nourished,male appearing in no acute distress. Head: Normocephalic, atraumatic, sclera non-icteric, no xanthomas, nares are without discharge.  Neck: No carotid bruits. JVD not elevated.  Lungs: Respirations regular and unlabored, without wheezes or rales.  Heart: Regular rate and rhythm. No S3 or S4.  No murmur, no rubs, or gallops appreciated. Abdomen: Soft, non-tender, non-distended with normoactive bowel sounds. No hepatomegaly. No rebound/guarding. No obvious abdominal masses. Msk:  Strength and tone appear normal for age. No joint deformities or effusions. Extremities: No clubbing or cyanosis. 2+ pitting edema up to mid-shins bilaterally.  Distal pedal pulses are 2+ bilaterally. Neuro: Alert and oriented X 3. Moves all extremities spontaneously. No focal deficits noted. Psych:  Responds to questions appropriately with a normal affect. Skin: No rashes or lesions noted  Wt Readings from Last 3 Encounters:  07/13/16 237 lb (107.5 kg)  07/09/16 233 lb 4.8 oz (105.8 kg)  07/01/16 240 lb (108.9 kg)    Studies/Labs Reviewed:   EKG:  EKG is ordered today.  The ekg ordered today demonstrates SR, HR 79, with 1st degree AV block. QTc 460 ms.   Recent Labs: 07/01/2016: TSH 1.300 07/05/2016: ALT 17 07/07/2016: Hemoglobin 8.3; Magnesium 2.0; Platelets 252 07/08/2016: BUN 14; Potassium 4.3; Sodium 138 07/09/2016: Creatinine, Ser 0.98   Lipid Panel    Component Value Date/Time   CHOL  122 07/03/2016 0715   TRIG 125 07/03/2016 0715   HDL 16 (L) 07/03/2016 0715   CHOLHDL 7.6 07/03/2016 0715   VLDL 25 07/03/2016 0715   LDLCALC 81 07/03/2016 0715    Additional studies/ records that were reviewed today include:   Echocardiogram: 07/03/2016 Study Conclusions  - Left ventricle: The cavity size was mildly dilated. Wall   thickness was increased in a pattern of mild LVH. Systolic   function was normal. The estimated ejection fraction was 55%.   Probable hypokinesis of the apicalinferior myocardium. Doppler   parameters are consistent with abnormal left ventricular   relaxation (grade 1 diastolic dysfunction). Doppler parameters   are consistent with high ventricular filling pressure. - Aortic valve: Bicuspid; moderately thickened leaflets.   Intermittently noted small echodensities seen near leaflet tips   in the parasternal views, although not well in other views.   Suspicious for vegetations particularly in light of bacteremia  although TEE is indicated for further assessment, and also to   exclude any associated abscess in light of fairly peripheral   aortic regurgitation. There was moderate regurgitation. Mean   gradient (S): 5 mm Hg. Peak gradient (S): 8 mm Hg. Regurgitation   pressure half-time: 383 ms. - Aorta: Aortic root dimension: 50 mm (ED). - Aortic root: The aortic root was moderately dilated. - Mitral valve: Calcified annulus. There was mild regurgitation. - Right atrium: Echodensity noted near the septum and tricuspid   valve plane, best seen in the apical views but not in the   subcostal views. Suspect artifact or perhaps visualization of   compressed free wall, although in light of bacteremia would   suggest further imaging with TEE to exclude other pathology.   Central venous pressure (est): 8 mm Hg. - Tricuspid valve: There was trivial regurgitation. - Pulmonary arteries: PA peak pressure: 28 mm Hg (S). - Pericardium, extracardiac: There was no  pericardial effusion.  Impressions:  - Mildly dilated left ventricle with mild LVH and LVEF   approximately 55%. Probable hypokinesis of the apical inferior   wall. Grade 1 diastolic dysfunction with increased filling   pressures. Calcified mitral annulus with mild mitral   regurgitation. Aortic valve is bicuspid and moderately thickened.   Intermittently noted small echodensities seen near leaflet tips   in the parasternal views, although not well in other views.   Suspicious for vegetations particularly in light of bacteremia   although TEE is indicated for further assessment, and also to   exclude any associated abscess in light of fairly peripheral   aortic regurgitation. Aortic root is moderately dilated, measures   up to 50 mm. Trivial tricuspid regurgitation with PASP estimated   28 mmHg. Echodensity noted in right atrium near the septum and   tricuspid valve plane, best seen in the apical views but not in   the subcostal views. Suspect artifact or perhaps visualization of   compressed free wall, although in light of bacteremia would   suggest further imaging with TEE to exclude other pathology.   Results discussed with Dr. Jerilee Hoh.   Assessment:    1. Acute on chronic diastolic CHF (congestive heart failure) ([place])   2. Atypical chest pain   3. Bicuspid aortic valve   4. Nonrheumatic aortic valve insufficiency   5. Streptococcal endocarditis   6. Prolonged P-R interval      Plan:   In order of problems listed above:  1. Acute on Chronic Diastolic CHF - echo during recent admission showed a preserved EF of 55% with Grade 1 DD. Has developed worsening lower extremity edema in the setting of severe AI. Weight is up 4 lbs since hospital discharge (233 --> 237 lbs). Thankfully lungs are clear on examination and he denies any orthopnea, PND or dyspnea on exertion.  - will start PO Lasix '40mg'$  daily. Already takes an OTC K+ supplementation.  Will recheck a BMET in 1 week.  Reviewed sodium and fluid restriction with the patient. Encouraged to keep a record of daily weights and to call the office if weights continue to trend upwards despite the initiation of Lasix, as dosing may need to be increased.   2. Atypical Chest Pain - reports an episode of a sharp chest pain occurring this morning and lasting approximately 30 seconds. Occurred at rest and was not associated with exertion. No associated symptoms.  - EKG is without acute ischemic changes. Seems atypical for a cardiac etiology.  - continue with  plans for a cardiac catheterization once volume status improves.   3. Bicuspid Aortic Valve/ Aortic Insufficiency -  TEE on 07/07/2016 showed a bicuspid aortic valve with severe eccentric AI but no definitive infection noted. The plan is for an outpatient cardiac catheterization and follow-up with CT Surgery in regards to AVR. Fluid status needs to improve prior to a cardiac catheterization being performed.   4. Streptococcal Endocarditis - diagnosed during recent admission.  - remains on IV Ceftriaxone for 6 weeks of total therapy.   5. Prolonged PR Interval - PR interval normal on admission but progressed throughout his hospitalization. 1st degree AV block still noted on EKG today. Appointment arranged for him to pick up an event monitor later this afternoon.     The patient lives in [place], [place] and wishes to follow-up there. His wife is a patient of Dr. Domenic Polite and he wishes for Dr. Domenic Polite to be his Primary Cardiologist. Will arrange for close follow-up next week.     Medication Adjustments/Labs and Tests Ordered: Current medicines are reviewed at length with the patient today.  Concerns regarding medicines are outlined above.  Medication changes, Labs and Tests ordered today are listed in the Patient Instructions below. Patient Instructions  Medication Instructions: START Lasix (Furosemide) 40 mg daily.  If you need a refill on your cardiac medications  before your next appointment, please call your pharmacy.  Follow-Up: Your physician recommends that you schedule a follow-up appointment in: [place] with Jory Sims, NP on 07/23/16.  Your physician recommends that you schedule a follow-up appointment in: 3 weeks with Dr. Domenic Polite in [place].   Charles Medici, PA  07/13/2016 9:08 PM    [facility] [place], [place], [place]  [address] Phone: [phone]; Fax: [phone]  [address] [place], [place] [address] Phone: [phone]

## 2016-07-14 ENCOUNTER — Other Ambulatory Visit (HOSPITAL_COMMUNITY): Payer: Medicare Other | Admitting: Dentistry

## 2016-07-15 ENCOUNTER — Telehealth (HOSPITAL_COMMUNITY): Payer: Self-pay | Admitting: Dentistry

## 2016-07-15 ENCOUNTER — Telehealth: Payer: Self-pay | Admitting: Cardiology

## 2016-07-15 NOTE — Telephone Encounter (Signed)
Returned call to [facility]. Patient new to her service, he voiced issue of chest pain which is "continuous" & "worse with deep inspiration", and her understanding was that this started the evening after his appt w [place].   I called patient to get better understanding of his symptoms. He identifies sensation in center of chest. Pt describes as, "just there", States "I wouldn't describe it as pain, more like a nagging sensation".  Patient qualifies symptom as intermittent, 2/10, dull, "low level sensitivity". States he takes deep breaths & discomfort is not present with that.  States no SOB, weakness. He "just wanted you all to be aware", but denies urgency for complaint. He's aware of my recommendation to go to ED should he have increase in severity of symptoms, associated SOB, fatigue, diaphoresis, etc. Patient is transitioning to care in [place] office and seeing provider next week. Will route for further f/u.

## 2016-07-15 NOTE — Telephone Encounter (Signed)
Yes, agree with recommendation to proceed to the ED if symptoms worsen. Plan is for a cardiac catheterization once his fluid status improves. Thank you!

## 2016-07-15 NOTE — Telephone Encounter (Signed)
New message   Kathlee Nations from [facility] is calling.  Pt c/o of Chest Pain: STAT if CP now or developed within 24 hours  1. Are you having CP right now? Yes-says its dull, nagging, aching pain  2. Are you experiencing any other symptoms (ex. SOB, nausea, vomiting, sweating)? no  3. How long have you been experiencing CP? Since Monday  4. Is your CP continuous or coming and going? continuous  5. Have you taken Nitroglycerin? No-pt does not have any?

## 2016-07-15 NOTE — Telephone Encounter (Signed)
07/15/16 Called and spoke w/pt. regarding schl. Dental Consult w/Dr. Enrique Sack.  Pt. has appt. w/his regular Dentist at this time and will call Dental Medicine if needed, per pt.   LRI

## 2016-07-20 DIAGNOSIS — Z5181 Encounter for therapeutic drug level monitoring: Secondary | ICD-10-CM | POA: Diagnosis not present

## 2016-07-20 DIAGNOSIS — B954 Other streptococcus as the cause of diseases classified elsewhere: Secondary | ICD-10-CM | POA: Diagnosis not present

## 2016-07-21 ENCOUNTER — Ambulatory Visit: Payer: Medicare Other | Admitting: Physician Assistant

## 2016-07-21 ENCOUNTER — Other Ambulatory Visit (HOSPITAL_COMMUNITY): Payer: Self-pay | Admitting: Dentistry

## 2016-07-23 ENCOUNTER — Ambulatory Visit (INDEPENDENT_AMBULATORY_CARE_PROVIDER_SITE_OTHER): Payer: Medicare Other | Admitting: Adult Health

## 2016-07-23 ENCOUNTER — Encounter: Payer: Self-pay | Admitting: Adult Health

## 2016-07-23 VITALS — BP 132/58 | HR 83 | Wt 227.0 lb

## 2016-07-23 DIAGNOSIS — I7121 Aneurysm of the ascending aorta, without rupture: Secondary | ICD-10-CM

## 2016-07-23 DIAGNOSIS — N289 Disorder of kidney and ureter, unspecified: Secondary | ICD-10-CM

## 2016-07-23 DIAGNOSIS — Q231 Congenital insufficiency of aortic valve: Secondary | ICD-10-CM | POA: Diagnosis not present

## 2016-07-23 DIAGNOSIS — D508 Other iron deficiency anemias: Secondary | ICD-10-CM

## 2016-07-23 DIAGNOSIS — I712 Thoracic aortic aneurysm, without rupture: Secondary | ICD-10-CM

## 2016-07-23 DIAGNOSIS — I330 Acute and subacute infective endocarditis: Secondary | ICD-10-CM

## 2016-07-23 NOTE — Progress Notes (Signed)
Name: Charles [name]    DOB: Aug 16, 1945  Age: 71 y.o.  MR#: [id number]       PCP:  Wende Neighbors, MD      Insurance: Payor: [place] / Plan: BCBS MEDICARE / Product Type: *No Product type* /   CC:   No chief complaint on file.   VS Vitals:   07/23/16 1509  BP: (!) 132/58  Pulse: 83  SpO2: 95%  Weight: 227 lb (103 kg)    Weights Current Weight  07/23/16 227 lb (103 kg)  07/13/16 237 lb (107.5 kg)  07/09/16 233 lb 4.8 oz (105.8 kg)    Blood Pressure  BP Readings from Last 3 Encounters:  07/23/16 (!) 132/58  07/13/16 128/62  07/09/16 132/60     Admit date:  (Not on file) Last encounter with RMR:  Visit date not found   Allergy Patient has no known allergies.  Current Outpatient Prescriptions  Medication Sig Dispense Refill  . cefTRIAXone (ROCEPHIN) IVPB Inject 2 g into the vein daily. Indication:  Endocarditis Last Day of Therapy:  08/06/2016 Labs - Once weekly:  CBC/D and BMP, Labs - Every other week:  ESR and CRP 27 Units 0  . ENSURE PLUS (ENSURE PLUS) LIQD Take 237 mLs by mouth daily.    . furosemide (LASIX) 40 MG tablet Take 1 tablet (40 mg total) by mouth daily. 30 tablet 2   No current facility-administered medications for this visit.     Discontinued Meds:   There are no discontinued medications.  Patient Active Problem List   Diagnosis Date Noted  . Second degree AV block, Mobitz type I 07/08/2016  . Weight loss   . Streptococcal endocarditis   . Protein-calorie malnutrition, severe 07/03/2016  . Anemia 07/02/2016  . H/O cardiac murmur 07/02/2016  . Ascending aortic aneurysm ([place]) 07/02/2016  . NSTEMI (non-ST elevated myocardial infarction) ([place]) 07/02/2016  . Bicuspid aortic valve 07/02/2016  . Aortic insufficiency 07/02/2016    LABS    Component Value Date/Time   NA 138 07/08/2016 0510   NA 137 07/07/2016 0500   NA 140 07/06/2016 0502   K 4.3 07/08/2016 0510   K 3.9 07/07/2016 0500   K 4.3 07/06/2016 0502   CL 104  07/08/2016 0510   CL 104 07/07/2016 0500   CL 106 07/06/2016 0502   CO2 25 07/08/2016 0510   CO2 25 07/07/2016 0500   CO2 25 07/06/2016 0502   GLUCOSE 110 (H) 07/08/2016 0510   GLUCOSE 100 (H) 07/07/2016 0500   GLUCOSE 103 (H) 07/06/2016 0502   BUN 14 07/08/2016 0510   BUN 14 07/07/2016 0500   BUN 17 07/06/2016 0502   CREATININE 0.98 07/09/2016 0545   CREATININE 1.03 07/08/2016 0510   CREATININE 0.91 07/07/2016 0500   CALCIUM 8.6 (L) 07/08/2016 0510   CALCIUM 8.3 (L) 07/07/2016 0500   CALCIUM 8.6 (L) 07/06/2016 0502   GFRNONAA >60 07/09/2016 0545   GFRNONAA >60 07/08/2016 0510   GFRNONAA >60 07/07/2016 0500   GFRAA >60 07/09/2016 0545   GFRAA >60 07/08/2016 0510   GFRAA >60 07/07/2016 0500   CMP     Component Value Date/Time   NA 138 07/08/2016 0510   K 4.3 07/08/2016 0510   CL 104 07/08/2016 0510   CO2 25 07/08/2016 0510   GLUCOSE 110 (H) 07/08/2016 0510   BUN 14 07/08/2016 0510   CREATININE 0.98 07/09/2016 0545   CALCIUM 8.6 (L) 07/08/2016 0510   PROT 6.2 (L) 07/05/2016  0300   ALBUMIN 2.2 (L) 07/05/2016 0300   AST 16 07/05/2016 0300   ALT 17 07/05/2016 0300   ALKPHOS 82 07/05/2016 0300   BILITOT 0.3 07/05/2016 0300   GFRNONAA >60 07/09/2016 0545   GFRAA >60 07/09/2016 0545       Component Value Date/Time   WBC 8.2 07/07/2016 0500   WBC 8.0 07/05/2016 0300   WBC 12.9 (H) 07/03/2016 0715   HGB 8.3 (L) 07/07/2016 0500   HGB 8.5 (L) 07/05/2016 0300   HGB 8.5 (L) 07/03/2016 0715   HCT 26.7 (L) 07/07/2016 0500   HCT 27.4 (L) 07/05/2016 0300   HCT 27.1 (L) 07/03/2016 0715   MCV 80.7 07/07/2016 0500   MCV 80.8 07/05/2016 0300   MCV 79.2 07/03/2016 0715    Lipid Panel     Component Value Date/Time   CHOL 122 07/03/2016 0715   TRIG 125 07/03/2016 0715   HDL 16 (L) 07/03/2016 0715   CHOLHDL 7.6 07/03/2016 0715   VLDL 25 07/03/2016 0715   LDLCALC 81 07/03/2016 0715    ABG    Component Value Date/Time   PHART 7.502 (H) 07/05/2016 0410   PCO2ART 28.9  (L) 07/05/2016 0410   PO2ART 88.1 07/05/2016 0410   HCO3 22.5 07/05/2016 0410   TCO2 26 07/02/2016 1744   ACIDBASEDEF 0.4 07/05/2016 0410   O2SAT 97.5 07/05/2016 0410     Lab Results  Component Value Date   TSH 1.300 07/01/2016   BNP (last 3 results) No results for input(s): BNP in the last 8760 hours.  ProBNP (last 3 results) No results for input(s): PROBNP in the last 8760 hours.  Cardiac Panel (last 3 results) No results for input(s): CKTOTAL, CKMB, TROPONINI, RELINDX in the last 72 hours.  Iron/TIBC/Ferritin/ %Sat    Component Value Date/Time   IRON 27 (L) 07/04/2016 1248   TIBC 234 (L) 07/04/2016 1248   FERRITIN 411 (H) 07/04/2016 1248   IRONPCTSAT 12 (L) 07/04/2016 1248     EKG Orders placed or performed in visit on 07/13/16  . EKG 12-Lead     Prior Assessment and Plan Problem List as of 07/23/2016 Reviewed: 07/13/2016  8:47 PM by Erma Heritage, PA     Cardiovascular and Mediastinum   Ascending aortic aneurysm ([place])   NSTEMI (non-ST elevated myocardial infarction) ([place])   Bicuspid aortic valve   Aortic insufficiency   Streptococcal endocarditis   Second degree AV block, Mobitz type I     Other   Anemia   H/O cardiac murmur   Protein-calorie malnutrition, severe   Weight loss       Imaging: Dg Orthopantogram  Result Date: 07/05/2016 CLINICAL DATA:  Congestive heart failure. History of dental work. Denies jaw pain. EXAM: ORTHOPANTOGRAM/PANORAMIC COMPARISON:  None. FINDINGS: Several root canal procedures have been performed. Surrounding a mandibular RIGHT premolar tooth, there is a periapical lucency (arrow). Several teeth are also missing. IMPRESSION: Query periodontal disease affecting a RIGHT mandibular premolar. Electronically Signed   By: Staci Righter M.D.   On: 07/05/2016 09:07   Dg Chest 2 View  Result Date: 07/05/2016 CLINICAL DATA:  Patient with history of congestive heart failure. EXAM: CHEST  2 VIEW COMPARISON:  Chest radiograph 07/01/2016;  chest CT 07/02/2016. FINDINGS: Monitoring leads overlie the patient. Stable prominent cardiac and mediastinal contours with tortuosity of the thoracic aorta. Minimal heterogeneous opacities lung bases bilaterally. Stable biapical pleuroparenchymal thickening. Lateral view limited due to overlapping soft tissue. Thoracic spine degenerative changes. IMPRESSION: Tortuous aorta. Aortic  atherosclerosis. Basilar atelectasis. Electronically Signed   By: Lovey Newcomer M.D.   On: 07/05/2016 08:12   Dg Chest 2 View  Result Date: 07/01/2016 CLINICAL DATA:  71 year old male with night sweats and weight loss. EXAM: CHEST  2 VIEW COMPARISON:  Chest radiograph dated 05/01/2016 FINDINGS: The lungs are clear. There is no pleural effusion or pneumothorax. The cardiac silhouette is within normal limits. The aorta is tortuous. There is prominence of the aortic root on the lateral projection which may be the artifactual and related to tortuosity. Aneurysmal dilatation of the ascending aorta or aortic root is not excluded. CT of the chest with contrast may provide better evaluation. No acute osseous pathology identified. IMPRESSION: 1. No acute cardiopulmonary process. 2. Tortuous aorta with dilated appearance of the ascending aorta/aortic root. CT with contrast may provide better evaluation. Electronically Signed   By: Anner Crete M.D.   On: 07/01/2016 23:21   Ct Head Wo Contrast  Result Date: 07/01/2016 CLINICAL DATA:  Fall in December not feeling well EXAM: CT HEAD WITHOUT CONTRAST TECHNIQUE: Contiguous axial images were obtained from the base of the skull through the vertex without intravenous contrast. COMPARISON:  None. FINDINGS: Brain: No acute territorial infarction, intracranial hemorrhage or focal mass lesion is visualized. The ventricles are nonenlarged. Mild atrophy. Vascular: No hyperdense vessels. Scattered calcifications at the carotid siphons. Skull: Normal. Negative for fracture or focal lesion.  Sinuses/Orbits: Minimal mucosal thickening in the ethmoid sinuses. Prior maxillary antrostomies. Tiny mucous retention cysts or small polyps in the maxillary sinuses. Other: None IMPRESSION: No CT evidence for acute intracranial abnormality. Electronically Signed   By: Donavan Foil M.D.   On: 07/01/2016 23:03   Ct Chest W Contrast  Result Date: 07/02/2016 CLINICAL DATA:  71 year old male with weight loss, night sweats, and abnormal chest x-ray. EXAM: CT CHEST WITH CONTRAST TECHNIQUE: Multidetector CT imaging of the chest was performed during intravenous contrast administration. CONTRAST:  77m ISOVUE-300 IOPAMIDOL (ISOVUE-300) INJECTION 61% COMPARISON:  Chest radiograph dated 07/01/2016 FINDINGS: Cardiovascular: Top-normal cardiac size. No pericardial effusion. The thoracic aorta is tortuous. There is mild dilatation of the ascending aorta measuring up to 4.4 cm in diameter. No aortic dissection. The origins of the great vessels of the aortic arch appear patent. The central pulmonary arteries are grossly unremarkable. Evaluation of the pulmonary arteries is limited due to suboptimal opacification and timing of the contrast. Mediastinum/Nodes: There is no hilar or mediastinal adenopathy. The esophagus and the thyroid gland are grossly unremarkable. Lungs/Pleura: There is mild elevation of the left hemidiaphragm with linear left lung base atelectasis/ scarring. Pneumonia is less likely. There is no pleural effusion or pneumothorax. The central airways are patent. Upper Abdomen: There is apparent fatty infiltration of the liver. A 2.1 cm hypodense lesion in the caudate lobe is not well characterized but may represent a cyst or hemangioma. There is a 2.5 cm indeterminate right adrenal hypodense nodule, most likely an adenoma. The visualized upper abdomen is otherwise unremarkable. Musculoskeletal: Mild degenerative changes of spine. No acute osseous pathology. IMPRESSION: 1. No acute intrathoracic pathology. Mildly  dilated ascending aorta measuring up to 4.4 cm in diameter. Recommend annual imaging followup by CTA or MRA. This recommendation follows 2010 ACCF/AHA/AATS/ACR/ASA/SCA/SCAI/SIR/STS/SVM Guidelines for the Diagnosis and Management of Patients with Thoracic Aortic Disease. Circulation. 2010; 121: eE454-U9812. Fatty liver. A small hypodense lesion in the caudate lobe is indeterminate but likely represents a cyst or hemangioma. 3. Probable right adrenal adenoma. Electronically Signed   By: ALaren EvertsD.  On: 07/02/2016 01:14

## 2016-07-23 NOTE — Progress Notes (Signed)
Cardiology Office Note   Date:  07/23/2016   ID:  Norman Piacentini, DOB 01-19-1946, MRN [id number]  PCP:  Wende Neighbors, MD  Cardiologist:  H[place]/  Jory Sims, NP   Chief Complaint  Patient presents with  . Endocarditis  . Cardiac Valve Problem    Bicuspid       History of Present Illness: Charles [name] is a 71 y.o. male who presents for post hospital physician follow-up after admission for chest pain, and bacteremia with history of heart murmur. He was diagnosed with endocarditis strep viridans. TEE did not show definite vegetation or abscess. Clinically however he was found to have SBE of the valve with possible perforation of the bowel leaflets. He was to be placed on a six-week course of antibiotics IV.   He was recommended once antibiotic therapy had been completed to be assessed for aortic valve replacement and aortic root grafting.  Aortic root was measured at 4.4 cm by CT, and 5.0 by echo. This was found to be related to bicuspid valve. He was also noted to have second-degree AV block Mobitz type I-we can block. The PR interval was normal on admission but had progressively prolonged PR with intermittent second-degree AV block. He was sent home on an event monitor. Event monitor was placed on 07/13/2016. Has not yet been read.   he comes in today fatigue but without any chest pain, dyspnea, edema in the lower extremities has resolved with addition of Lasix which was ordered for him last week. He is now on 40 mg daily.  He is not on potassium replacement yet. He has advanced home health care nurses dressing his PICC line and giving him antibiotics.   Past Medical History:  Diagnosis Date  . Anemia   . Aortic insufficiency    a. 07/07/2016: TEE showing bicuspid aortic valve with severe eccentric AI   . Ascending aortic aneurysm ([facility])    4.6 cm  . Bacteremia 07/02/2016   Streptococcus viridans   . Bicuspid aortic valve     Past Surgical History:  Procedure Laterality Date  .  HERNIA REPAIR     left inguinal  . TEE WITHOUT CARDIOVERSION N/A 07/07/2016   Procedure: TRANSESOPHAGEAL ECHOCARDIOGRAM (TEE);  Surgeon: Larey Dresser, MD;  Location: [facility] ENDOSCOPY;  Service: Cardiovascular;  Laterality: N/A;     Current Outpatient Prescriptions  Medication Sig Dispense Refill  . cefTRIAXone (ROCEPHIN) IVPB Inject 2 g into the vein daily. Indication:  Endocarditis Last Day of Therapy:  08/06/2016 Labs - Once weekly:  CBC/D and BMP, Labs - Every other week:  ESR and CRP 27 Units 0  . ENSURE PLUS (ENSURE PLUS) LIQD Take 237 mLs by mouth daily.    . furosemide (LASIX) 40 MG tablet Take 1 tablet (40 mg total) by mouth daily. 30 tablet 2   No current facility-administered medications for this visit.     Allergies:   Patient has no known allergies.    Social History:  The patient  reports that he has quit smoking. His smoking use included Cigarettes. He has never used smokeless tobacco. He reports that he does not drink alcohol or use drugs.   Family History:  The patient's family history includes Heart disease (age of onset: 89) in his father; Ovarian cancer in his mother.    ROS: All other systems are reviewed and negative. Unless otherwise mentioned in H&P    PHYSICAL EXAM: VS:  BP (!) 132/58   Pulse 83   Wt 227 lb (  103 kg)   SpO2 95%   BMI 30.79 kg/m  , BMI Body mass index is 30.79 kg/m. GEN: Well nourished, well developed, in no acute distress  HEENT: normal  Neck: no JVD, carotid bruits, or masses Cardiac: RRR; 1/6 systolic murmur, with diastolic murmur, no murmurs, rubs, or gallops,no edema  Respiratory:  Clear to auscultation bilaterally, normal work of breathing GI: soft, nontender, nondistended, + BS MS: no deformity or atrophy PIC line in upper right arm, no signs of infection or bleeding. Sore to touch. Right ankle, DP area tender with venous stasis skin changes, and skin thinning.  Skin: warm and dry, no rash Neuro:  Strength and sensation are  intact Psych: euthymic mood, full affect   ZJQ:BHALPFX cardiac monitor  Recent Labs: 07/01/2016: TSH 1.300 07/05/2016: ALT 17 07/07/2016: Hemoglobin 8.3; Magnesium 2.0; Platelets 252 07/08/2016: BUN 14; Potassium 4.3; Sodium 138 07/09/2016: Creatinine, Ser 0.98    Lipid Panel    Component Value Date/Time   CHOL 122 20-Jul-2016 0715   TRIG 125 Jul 20, 2016 0715   HDL 16 (L) July 20, 2016 0715   CHOLHDL 7.6 07/20/16 0715   VLDL 25 07/20/16 0715   LDLCALC 81 07-20-2016 0715      Wt Readings from Last 3 Encounters:  07/23/16 227 lb (103 kg)  07/13/16 237 lb (107.5 kg)  07/09/16 233 lb 4.8 oz (105.8 kg)      Other studies Reviewed: Echocardiogram July 20, 2016 Left ventricle: The cavity size was mildly dilated. Wall   thickness was increased in a pattern of mild LVH. Systolic   function was normal. The estimated ejection fraction was 55%.   Probable hypokinesis of the apicalinferior myocardium. Doppler   parameters are consistent with abnormal left ventricular   relaxation (grade 1 diastolic dysfunction). Doppler parameters   are consistent with high ventricular filling pressure. - Aortic valve: Bicuspid; moderately thickened leaflets.   Intermittently noted small echodensities seen near leaflet tips   in the parasternal views, although not well in other views.   Suspicious for vegetations particularly in light of bacteremia   although TEE is indicated for further assessment, and also to   exclude any associated abscess in light of fairly peripheral   aortic regurgitation. There was moderate regurgitation. Mean   gradient (S): 5 mm Hg. Peak gradient (S): 8 mm Hg. Regurgitation   pressure half-time: 383 ms. - Aorta: Aortic root dimension: 50 mm (ED). - Aortic root: The aortic root was moderately dilated. - Mitral valve: Calcified annulus. There was mild regurgitation. - Right atrium: Echodensity noted near the septum and tricuspid   valve plane, best seen in the apical views but  not in the   subcostal views. Suspect artifact or perhaps visualization of   compressed free wall, although in light of bacteremia would   suggest further imaging with TEE to exclude other pathology.   Central venous pressure (est): 8 mm Hg. - Tricuspid valve: There was trivial regurgitation. - Pulmonary arteries: PA peak pressure: 28 mm Hg (S). - Pericardium, extracardiac: There was no pericardial effusion.   ASSESSMENT AND PLAN:  1.   Aortic valve endocarditis:  Continues treatment with IV Rocephin via riht arm PICC line, treatment is directed by Dr. Megan Salon , infectious disease, and is administered by advanced home health care nurses. Plans to follow-up with Dr. Ricard Dillon for aortic valve replacement along with  Ascending aorta  repair once antibiotic regimen is completed. H is already scheduled to be seen by Dr. Ricard Dillon on 08/14/2016.  2.  Mobitz  type I second-degree AV block: he is wearing cardiac monitor currently. This will be taken off in approximately one week. We will be able to follow-up with appointment to discuss results. No telemetry sheets are available at this time. He is asymptomatic for chest pain, racing heart rate, near syncope, or dyspnea.  3. Viridans streptococcal bacteremia:  He is to follow-up with D. Kulinski  Oral surgery and extractions.  He will evaluate him for oral candidiasis on multiple antibiotics.  4.  Anemia:  We'll have CBC ordered. This will be completed by home health nurses when drawn BMET.  5.  Fluid retention: resolved with use of by mouth Lasix. Checking BMET for kidney function and potassium status.    Current medicines are reviewed at length with the patient today.    Labs/ tests ordered today include:   Orders Placed This Encounter  Procedures  . CBC with Differential  . Basic Metabolic Panel (BMET)     Disposition:   FU with  Dr. Domenic Polite in one month  Signed, Jory Sims, NP  07/23/2016 3:58 PM    [facility]. [address] [place], [place] [address] Phone: [phone]; Fax: [phone]

## 2016-07-23 NOTE — Patient Instructions (Signed)
Your physician recommends that you schedule a follow-up appointment with Dr. Domenic Polite.  Your physician recommends that you continue on your current medications as directed. Please refer to the Current Medication list given to you today.  Your physician recommends that you return for lab work to be done by home health nurse.   If you need a refill on your cardiac medications before your next appointment, please call your pharmacy.  Thank you for choosing [facility]!

## 2016-07-27 ENCOUNTER — Telehealth: Payer: Self-pay | Admitting: *Deleted

## 2016-07-27 ENCOUNTER — Other Ambulatory Visit: Payer: Self-pay | Admitting: Internal Medicine

## 2016-07-27 DIAGNOSIS — I38 Endocarditis, valve unspecified: Secondary | ICD-10-CM | POA: Diagnosis not present

## 2016-07-27 DIAGNOSIS — I509 Heart failure, unspecified: Secondary | ICD-10-CM | POA: Diagnosis not present

## 2016-07-27 DIAGNOSIS — R079 Chest pain, unspecified: Secondary | ICD-10-CM

## 2016-07-27 DIAGNOSIS — Z5181 Encounter for therapeutic drug level monitoring: Secondary | ICD-10-CM | POA: Diagnosis not present

## 2016-07-27 DIAGNOSIS — B954 Other streptococcus as the cause of diseases classified elsewhere: Secondary | ICD-10-CM | POA: Diagnosis not present

## 2016-07-27 NOTE — Telephone Encounter (Signed)
[facility] called and states that pt. c/o chest pain that occurred two days ago. Pt states that he has been having chest pain on and off. On 4/14 pt reports CP @ 0250 to the center of the chest lasting 2 mins. He denies SOB, sweating, and nausea. Pt reports CP on 4/13 to the left of center of the chest lasting 5 mins at 2102 and 1403 in the center of the chest. Lasting less than 2 min. Pt reports having chest pains to the lower left abd . He states the pains were in the lower part of his lung. Please advise. Patient states he can be reached all day at [phone].

## 2016-07-28 ENCOUNTER — Encounter: Payer: Self-pay | Admitting: Cardiology

## 2016-07-28 ENCOUNTER — Ambulatory Visit (HOSPITAL_COMMUNITY)
Admission: RE | Admit: 2016-07-28 | Discharge: 2016-07-28 | Disposition: A | Discharge status: Home / Self Care | Payer: Medicare Other | Source: Ambulatory Visit | Attending: Adult Health | Admitting: Adult Health

## 2016-07-28 ENCOUNTER — Encounter (HOSPITAL_COMMUNITY): Payer: Self-pay | Admitting: Vascular Surgery

## 2016-07-28 ENCOUNTER — Encounter: Payer: Self-pay | Admitting: Internal Medicine

## 2016-07-28 ENCOUNTER — Ambulatory Visit (INDEPENDENT_AMBULATORY_CARE_PROVIDER_SITE_OTHER): Payer: Medicare Other | Admitting: Internal Medicine

## 2016-07-28 ENCOUNTER — Telehealth: Payer: Self-pay | Admitting: Pharmacist

## 2016-07-28 DIAGNOSIS — B955 Unspecified streptococcus as the cause of diseases classified elsewhere: Secondary | ICD-10-CM

## 2016-07-28 DIAGNOSIS — I330 Acute and subacute infective endocarditis: Secondary | ICD-10-CM | POA: Diagnosis not present

## 2016-07-28 DIAGNOSIS — M47894 Other spondylosis, thoracic region: Secondary | ICD-10-CM | POA: Diagnosis not present

## 2016-07-28 DIAGNOSIS — R079 Chest pain, unspecified: Secondary | ICD-10-CM | POA: Insufficient documentation

## 2016-07-28 MED ORDER — CEFTRIAXONE IV (FOR PTA / DISCHARGE USE ONLY): 2.0000 g | INTRAVENOUS | 0 refills | Status: AC

## 2016-07-28 NOTE — Progress Notes (Signed)
Anesthesia Chart Review: SAME DAY WORK-UP.  Patient is a 71 year old male scheduled for multiple teeth extractions on 07/29/16 (8:30 AM) by Dr. Enrique Sack.  History includes former smoker, NSTEMI 06/2016 (in the setting of bacteremia/endocarditis), bicuspid AV with severe AI 06/2016, Streptococcus viridans bacteremia with native AV endocarditis (hospitalized 07/02/16-07/09/16), ascending TAA, hernia repair, fall on ice with closed head injury 03/2016 (did not seek medical attention), influenza 03/2016. Last BMI 30.79. He denied ETOH, IV drug use. He was seen by cardiologist Dr. Percival Spanish and CT surgeon Dr. Roxy Manns. By notes, there was no urgent indication for AVR or cardiac cath during hospitalization, but may ultimately need. Dr. Roxy Manns is scheduled to see him in follow-up on 08/17/16. If he does require AVR, he will need cardiac cath prior.   - PCP is Dr. Emi Belfast. - Cardiologist is listed as Dr. Percival Spanish, last visit with Jory Sims, NP on 07/23/16 at [facility]).  - ID is Dr. Michel Bickers, currently with appointment scheduled for this afternoon.  Meds include Rocephin, ferrous sulfate, Lasix, MVI.   EKG 07/13/16: SR with first degree AV block, LAD, LVH with QRS widening.  TEE 07/07/16: Study Conclusions - Left ventricle: The cavity size was moderately dilated. Wall   thickness was normal. Systolic function was normal. The estimated   ejection fraction was in the range of 60% to 65%. Wall motion was   normal; there were no regional wall motion abnormalities. - Aortic valve: The aortic valve was bicuspid. There was eccentric   aortic insufficiency that appeared severe. There was no aortic   stenosis. No definite vegetation seen. There was a small area of   thickening with possible potential space at the valve base   adjacent to the RV. The AI appeared to originate from this area.   However, I am not convinced this is a vegetation or abscess. - Aorta: The aortic root was dilated  to to 4.7 cm with a 4.4 cm   ascending aorta. Diastolic flow reversal noted in the ascending   thoracic aorta. - Mitral valve: No evidence of vegetation. There was mild   regurgitation. - Left atrium: No evidence of thrombus in the atrial cavity or   appendage. - Right ventricle: The cavity size was normal. Systolic function   was normal. - Right atrium: No evidence of thrombus in the atrial cavity or   appendage. - Atrial septum: No defect or patent foramen ovale was identified. - Tricuspid valve: No evidence of vegetation. - Pulmonic valve: No evidence of vegetation. Impressions: - 1. Moderately dilated LV with normal systolic function.   2. Bicuspid aortic valve with severe eccentric aortic   insufficiency. No definite infection seen.   3. Moderately dilated aortic root and ascending aorta.  CXR 07/28/16: PENDING.  Labs from 07/27/16 ([facility]) showed glucose 84, the 124, creatinine 0.94, sodium 144, potassium 4.0, WBC 6.2, hemoglobin 9.2, hematocrit 29.9, platelet 344, sed rate elevated at 48, C-reactive protein quantitative elevated at 15.1.  There are telephone communication from yesterday indicating that patient has been having chest pain on and off. I discussed above with anesthesiologist Dr. Nyoka Cowden and with Dr. Enrique Sack. Will need to see if cardiology feels patient should hold off on dental surgery until re-evaluated. He has also not been assigned to a [facility] cardiologist yet. I spoke with Jory Sims, NP. Patient will be seen by Dr. Rozann Lesches tomorrow for evaluation. Postpone dental surgery until further cardiology recommendations. I will notify Dr. Enrique Sack.  Myra Gianotti, PA-C  [facility] Short Stay Center/Anesthesiology Phone [phone] 07/28/2016 2:51 PM

## 2016-07-28 NOTE — Telephone Encounter (Signed)
Uncertain why he is experiencing chest discomfort now.  Please recheck labs with BMET and Mg+. CXR to evaluate for CHF. He needs appointment with cardiologist. If pain gets worse he will need to go to ER. Do not want to prescribe pain medications over the phone.

## 2016-07-28 NOTE — Assessment & Plan Note (Signed)
He is improving on therapy for his viridans strep aortic valve endocarditis. He will complete 4 weeks of therapy tomorrow and then have his PICC removed. He will follow-up with me in 6 weeks. He knows to contact me right away if he has any signs of early relapse.

## 2016-07-28 NOTE — Addendum Note (Signed)
Addended by: Levonne Hubert on: 07/28/2016 10:13 AM   Modules accepted: Orders

## 2016-07-28 NOTE — Telephone Encounter (Signed)
Called and gave verbal order to [place] at [facility] to pull patient's PICC line on Thursday 4/19

## 2016-07-28 NOTE — Telephone Encounter (Signed)
Pt notified of appt tomorrow with Dr. Domenic Polite.

## 2016-07-28 NOTE — Telephone Encounter (Signed)
Notified pt of Charles [name] notes. Pt states he had labs done yesterday by [facility]. Will call for results. Pt will come have CXR today.

## 2016-07-28 NOTE — Progress Notes (Signed)
Cardiology Office Note  Date: 07/29/2016   ID: Charles [name], DOB Jun 12, 1945, MRN [id number]  PCP: Wende Neighbors, MD  Evaluating Cardiologist: Rozann Lesches, MD   Chief Complaint  Patient presents with  . Aortic valve disease  . Recent endocarditis  . Chest Pain    History of Present Illness: Charles [name] is a medically complex 71 y.o. male presenting for a follow-up office visit and for preoperative clearance. This is my first meeting with him today. I reviewed extensive records and updated his chart. He was recently hospitalized in March with Viridans streptococcal bacteremia and endocarditis. He had no definitive valvular vegetation by TEE but at baseline has a bicuspid aortic valve and had severe aortic regurgitation. He was seen by ID service with plan for 4 weeks IV antibiotics. 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 His note from March 28 indicated concern that there may have been some component of leaflet perforation. Patient was to undergo dental work and also cardiac catheterization. He was seen in the office by Ms. Lawrence NP following discharge on April 12.  My understanding in reviewing the chart and speaking with him today is that he was to undergo dental work earlier this morning, however the procedure was canceled by Dr. Enrique Sack given concerns about reported chest pain. Patient states that over the last 3-4 weeks he has had intermittent episodes of chest pain, lasting anywhere from seconds to a few minutes, no precipitant, generally sharp in quality, and not specifically relieved by anything in particular. He has had no palpitations during this time. He is currently wearing a heart monitor to exclude any specific arrhythmias or progressive conduction system disease. Patient followed up with Dr. Megan Salon yesterday in the ID clinic, has just completed 4 weeks of IV antibiotics and is to have his  PICC line removed tomorrow. He does not report any fevers or chills. Reports steadily improving appetite.  Today we had a long discussion regarding his recent hospitalization and general plan moving toward anticipated AVR with Dr. Roxy Manns. We discussed proceeding with a diagnostic cardiac catheterization as a next step, now that he has completed IV antibiotics, for clear assessment of coronary anatomy particularly in light of his chest pain symptoms, although these are fairly atypical. We discussed the risks and benefits, and he is in agreement to proceed.  Past Medical History:  Diagnosis Date  . Anemia   . Aortic insufficiency    a. 07/07/2016: TEE showing bicuspid aortic valve with severe eccentric AI   . Ascending aortic aneurysm (HCC)    4.4 cm by CT 06/2016  . 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 Bicuspid aortic valve     Past Surgical History:  Procedure Laterality Date  . HERNIA REPAIR Left   . TEE WITHOUT CARDIOVERSION N/A 07/07/2016   Procedure: TRANSESOPHAGEAL ECHOCARDIOGRAM (TEE);  Surgeon: Larey Dresser, MD;  Location[facility] ENDOSCOPY;  Service: Cardiovascular;  Laterality: N/A;    Current Outpatient Prescriptions  Medication Sig Dispense Refill  . acetaminophen (TYLENOL) 325 MG tablet Take 650 mg by mouth every 6 (six) hours as needed for headache.    Marland Kitchen aspirin EC 81 MG tablet Take 81 mg by mouth daily.    . cefTRIAXone (ROCEPHIN) IVPB Inject 2 g into the vein daily. Indication:  Endocarditis Last Day of Therapy:  08/06/2016 Labs - Once weekly:  CBC/D and BMP,  Labs - Every other week:  ESR and CRP 27 Units 0  . ENSURE PLUS (ENSURE PLUS) LIQD Take 237 mLs by mouth daily.    . Ferrous Sulfate 27 MG TABS Take 27 mg by mouth daily.    . furosemide (LASIX) 40 MG tablet Take 1 tablet (40 mg total) by mouth daily. 30 tablet 2  . Multiple Vitamin (MULTIVITAMIN WITH MINERALS) TABS tablet Take 1 tablet by mouth daily.     No current facility-administered  medications for this visit.    Allergies:  No known allergies   Social History: The patient  reports that he quit smoking about 51 years ago. His smoking use included Cigarettes. He has never used smokeless tobacco. He reports that he does not drink alcohol or use drugs.   Family History: The patient's family history includes Heart disease (age of onset: 38) in his father; Ovarian cancer in his mother.   ROS:  Please see the history of present illness. Otherwise, complete review of systems is positive for improving appetite and fatigue.  All other systems are reviewed and negative.   Physical Exam: VS:  BP (!) 128/58   Pulse 77   Ht 6' (1.829 m)   Wt 229 lb (103.9 kg)   SpO2 97%   BMI 31.06 kg/m , BMI Body mass index is 31.06 kg/m.  Wt Readings from Last 3 Encounters:  07/29/16 229 lb (103.9 kg)  07/28/16 231 lb (104.8 kg)  07/23/16 227 lb (103 kg)    General: Obese male, appears comfortable at rest. HEENT: Conjunctiva and lids normal, oropharynx clear. Neck: Supple, no elevated JVP or carotid bruits, no thyromegaly. Lungs: Clear to auscultation, nonlabored breathing at rest. Cardiac: Regular rate and rhythm, no S3, 2/6 systolic murmur and 3/6 diastolic murmur, no pericardial rub. Abdomen: Soft, nontender, bowel sounds present, no guarding or rebound. Extremities: No pitting edema, distal pulses 2+. Skin: Warm and dry. Musculoskeletal: No kyphosis. Neuropsychiatric: Alert and oriented x3, affect grossly appropriate.  ECG: I personally reviewed the tracing from 07/13/2016 which showed sinus rhythm with prolonged PR interval, left anterior fascicular block, and increased voltage.  Recent Labwork: 07/01/2016: TSH 1.300 07/05/2016: ALT 17; AST 16 07/07/2016: Hemoglobin 8.3; Magnesium 2.0; Platelets 252 07/08/2016: BUN 14; Potassium 4.3; Sodium 138 07/09/2016: Creatinine, Ser 0.98     Component Value Date/Time   CHOL 122 07/03/2016 0715   TRIG 125 07/03/2016 0715   HDL 16 (L)  07/03/2016 0715   CHOLHDL 7.6 07/03/2016 0715   VLDL 25 07/03/2016 0715   LDLCALC 81 07/03/2016 0715    Other Studies Reviewed Today:  Chest CTA 07/02/2016: IMPRESSION: 1. No acute intrathoracic pathology. 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 Recommend annual imaging followup by CTA or MRA. This recommendation follows 2010 ACCF/AHA/AATS/ACR/ASA/SCA/SCAI/SIR/STS/SVM Guidelines for the Diagnosis and Management of Patients with Thoracic Aortic Disease. Circulation. 2010; 121: G401-U272 2. Fatty liver. A small hypodense lesion in the caudate lobe is indeterminate but likely represents a cyst or hemangioma. 3. Probable right adrenal adenoma.  TEE 07/07/2016: Study Conclusions  - Left ventricle: The cavity size was moderately dilated. Wall   thickness was normal. Systolic function was normal. The estimated   ejection fraction was in the range of 60% to 65%. Wall motion was   normal; there were no regional wall motion abnormalities. - Aortic valve: The aortic valve was bicuspid. There was eccentric   aortic insufficiency that appeared severe. There was no aortic   stenosis. No definite vegetation  seen. There was a small area of   thickening with possible potential space at the valve base   adjacent to the RV. The AI appeared to originate from this area.   However, I am not convinced this is a vegetation or abscess. - Aorta: The aortic root was dilated to to 4.7 cm with a 4.4 cm   ascending aorta. Diastolic flow reversal noted in the ascending   thoracic aorta. - Mitral valve: No evidence of vegetation. There was mild   regurgitation. - Left atrium: No evidence of thrombus in the atrial cavity or   appendage. - Right ventricle: The cavity size was normal. Systolic function   was normal. - Right atrium: No evidence of thrombus in the atrial cavity or   appendage. - Atrial septum: No defect or patent foramen ovale was identified. - Tricuspid valve: No  evidence of vegetation. - Pulmonic valve: No evidence of vegetation.  Impressions:  - 1. Moderately dilated LV with normal systolic function.   2. Bicuspid aortic valve with severe eccentric aortic   insufficiency. No definite infection seen.   3. Moderately dilated aortic root and ascending aorta.  Assessment and Plan:  1. Recurring atypical chest pain as outlined above. Patient otherwise has been steadily improving in terms of weakness and improved appetite. He completed a four-week course of IV antibiotics today and will have his PICC line removed tomorrow. Dental work was canceled given concerns about his chest pain. After reviewing the situation and plan, we will move ahead with arranging a diagnostic cardiac catheterization for early next week to clearly evaluate coronary anatomy. Results need to be conveyed to Dr. Roxy Manns who will be seeing the patient within the next few weeks. If coronary anatomy is reassuring, dental work should be rescheduled as well.  2. Viridans streptococcal bacteremia and endocarditis. Patient had follow-up with Dr. Megan Salon yesterday he has completed 4 weeks of IV antibiotics. PICC line to be removed tomorrow. No fevers or chills.  3. Bicuspid aortic valve with severe eccentric aortic regurgitation. No definitive valvular vegetation by TEE although endocarditis still suspected. Patient has follow-up with Dr. Roxy Manns in the next few weeks to discuss AVR.  4. Ascending aortic aneurysm measuring 4.4 cm by recent CTA.  5. Remote history of tobacco use. No obvious chronic lung disease.  Current medicines were reviewed with the patient today.   Orders Placed This Encounter  Procedures  . CBC w/Diff/Platelet  . Basic Metabolic Panel (BMET)  . INR/PT    Disposition: Follow-up after cardiac catheterization.  Signed, Satira Sark, MD, [facility] 07/29/2016 3:56 PM    [place] [facility] [address], [place], [place]  [address] Phone: [phone]; Fax: [phone]

## 2016-07-28 NOTE — Progress Notes (Signed)
[name] for Infectious Disease  Patient Active Problem List   Diagnosis Date Noted  . Streptococcal endocarditis     Priority: High  . Second degree AV block, Mobitz type I 07/08/2016  . Weight loss   . Protein-calorie malnutrition, severe 07/03/2016  . Anemia 07/02/2016  . H/O cardiac murmur 07/02/2016  . Ascending aortic aneurysm ([place]) 07/02/2016  . NSTEMI (non-ST elevated myocardial infarction) ([place]) 07/02/2016  . Bicuspid aortic valve 07/02/2016  . Aortic insufficiency 07/02/2016    Patient's Medications  New Prescriptions   No medications on file  Previous Medications   ACETAMINOPHEN (TYLENOL) 325 MG TABLET    Take 650 mg by mouth every 6 (six) hours as needed for headache.   CEFTRIAXONE (ROCEPHIN) IVPB    Inject 2 g into the vein daily. Indication:  Endocarditis Last Day of Therapy:  08/06/2016 Labs - Once weekly:  CBC/D and BMP, Labs - Every other week:  ESR and CRP   ENSURE PLUS (ENSURE PLUS) LIQD    Take 237 mLs by mouth daily.   FERROUS SULFATE 27 MG TABS    Take 27 mg by mouth daily.   FUROSEMIDE (LASIX) 40 MG TABLET    Take 1 tablet (40 mg total) by mouth daily.   MULTIPLE VITAMIN (MULTIVITAMIN WITH MINERALS) TABS TABLET    Take 1 tablet by mouth daily.  Modified Medications   No medications on file  Discontinued Medications   No medications on file    Subjective: Charles [name] is in for his hospital follow-up visit. He was hospitalized last month and diagnosed with subacute bacterial endocarditis of his aortic valve caused by viridans strep. He had been sick for 2-3 months with fever, chills, sweats and weight loss. Transesophageal echocardiogram showed a bicuspid aortic valve. There was no definite vegetation or perivalvular abscess. He did have severe aortic insufficiency. He was noted to have first-degree heart block. He was discharged on IV ceftriaxone and has now completed 27 days. He is feeling much better. He has not had any further fever,  chills, or sweats. His appetite has improved and he believes he is starting to regain some of the weight that that he lost. He has had some discomfort in his right arm around his PICC but otherwise has not had any trouble tolerating his antibiotic.  Review of Systems: Review of Systems  Constitutional: Positive for weight loss. Negative for chills, diaphoresis and fever.  Respiratory: Negative for shortness of breath.   Cardiovascular: Positive for chest pain.  Gastrointestinal: Negative for abdominal pain, diarrhea, nausea and vomiting.    Past Medical History:  Diagnosis Date  . Anemia   . Aortic insufficiency    a. 07/07/2016: TEE showing bicuspid aortic valve with severe eccentric AI   . Ascending aortic aneurysm (HCC)    4.6 cm  . Bacteremia 07/02/2016   Streptococcus viridans   . Bicuspid aortic valve     Social History  Substance Use Topics  . Smoking status: Former Smoker    Types: Cigarettes  . Smokeless tobacco: Never Used     Comment: Quit 30 years ago.    . Alcohol use No    Family History  Problem Relation Age of Onset  . Ovarian cancer Mother   . Heart disease Father 23    Died of coronary thrombosis.      Allergies  Allergen Reactions  . No Known Allergies     Objective: Vitals:   07/28/16 1432  BP: 127/72  Pulse: 80  Temp: 98 F (36.7 C)  TempSrc: Oral  Weight: 231 lb (104.8 kg)  Height: 6' (1.829 m)   Body mass index is 31.33 kg/m.  Physical Exam  Constitutional: He is oriented to person, place, and time.  He is looking much better and in good spirits.  Cardiovascular: Normal rate and regular rhythm.   Murmur heard. No change in 2/6 to and fro systolic and diastolic murmurs.  Pulmonary/Chest: Effort normal and breath sounds normal. He has no rales.  He is wearing a heart monitor.  Neurological: He is alert and oriented to person, place, and time.  Skin: No rash noted.  Right arm PICC site looks good.  Psychiatric: Mood and affect  normal.    Lab Results    Problem List Items Addressed This Visit      High   Streptococcal endocarditis    He is improving on therapy for his viridans strep aortic valve endocarditis. He will complete 4 weeks of therapy tomorrow and then have his PICC removed. He will follow-up with me in 6 weeks. He knows to contact me right away if he has any signs of early relapse.          Michel Bickers, MD [facility] [phone] pager   [phone] cell 07/28/2016, 3:18 PM

## 2016-07-29 ENCOUNTER — Ambulatory Visit (INDEPENDENT_AMBULATORY_CARE_PROVIDER_SITE_OTHER): Payer: Medicare Other | Admitting: Cardiology

## 2016-07-29 ENCOUNTER — Encounter (HOSPITAL_COMMUNITY): Admission: RE | Payer: Self-pay | Source: Ambulatory Visit

## 2016-07-29 ENCOUNTER — Other Ambulatory Visit: Payer: Self-pay | Admitting: Cardiology

## 2016-07-29 ENCOUNTER — Encounter: Payer: Self-pay | Admitting: Cardiology

## 2016-07-29 ENCOUNTER — Other Ambulatory Visit (HOSPITAL_COMMUNITY)
Admission: RE | Admit: 2016-07-29 | Discharge: 2016-07-29 | Disposition: A | Discharge status: Home / Self Care | Payer: Medicare Other | Source: Ambulatory Visit | Attending: Cardiology | Admitting: Cardiology

## 2016-07-29 ENCOUNTER — Ambulatory Visit (HOSPITAL_COMMUNITY): Admission: RE | Admit: 2016-07-29 | Payer: Medicare Other | Source: Ambulatory Visit | Admitting: Dentistry

## 2016-07-29 VITALS — BP 128/58 | HR 77 | Ht 72.0 in | Wt 229.0 lb

## 2016-07-29 DIAGNOSIS — Z01812 Encounter for preprocedural laboratory examination: Secondary | ICD-10-CM | POA: Insufficient documentation

## 2016-07-29 DIAGNOSIS — I330 Acute and subacute infective endocarditis: Secondary | ICD-10-CM

## 2016-07-29 DIAGNOSIS — R0789 Other chest pain: Secondary | ICD-10-CM

## 2016-07-29 DIAGNOSIS — B955 Unspecified streptococcus as the cause of diseases classified elsewhere: Secondary | ICD-10-CM | POA: Diagnosis not present

## 2016-07-29 DIAGNOSIS — Z01810 Encounter for preprocedural cardiovascular examination: Secondary | ICD-10-CM | POA: Diagnosis not present

## 2016-07-29 DIAGNOSIS — I712 Thoracic aortic aneurysm, without rupture: Secondary | ICD-10-CM

## 2016-07-29 DIAGNOSIS — Q231 Congenital insufficiency of aortic valve: Secondary | ICD-10-CM | POA: Diagnosis not present

## 2016-07-29 DIAGNOSIS — F17201 Nicotine dependence, unspecified, in remission: Secondary | ICD-10-CM

## 2016-07-29 DIAGNOSIS — I7121 Aneurysm of the ascending aorta, without rupture: Secondary | ICD-10-CM

## 2016-07-29 LAB — BASIC METABOLIC PANEL
Anion gap: 9 (ref 5–15)
BUN: 25 mg/dL — AB (ref 6–20)
CO2: 26 mmol/L (ref 22–32)
CREATININE: 1.17 mg/dL (ref 0.61–1.24)
Calcium: 8.8 mg/dL — ABNORMAL LOW (ref 8.9–10.3)
Chloride: 101 mmol/L (ref 101–111)
GFR calc Af Amer: >60 mL/min (ref >60)
Glucose, Bld: 107 mg/dL — ABNORMAL HIGH (ref 65–99)
POTASSIUM: 4.3 mmol/L (ref 3.5–5.1)
Sodium: 136 mmol/L (ref 135–145)

## 2016-07-29 LAB — CBC WITH DIFFERENTIAL/PLATELET
BASOS ABS: 0 10*3/uL (ref 0.0–0.1)
BASOS PCT: 0 %
Eosinophils Absolute: 0.2 10*3/uL (ref 0.0–0.7)
Eosinophils Relative: 2 %
HEMATOCRIT: 34 % — AB (ref 39.0–52.0)
Hemoglobin: 10.5 g/dL — ABNORMAL LOW (ref 13.0–17.0)
Lymphocytes Relative: 26 %
Lymphs Abs: 2 10*3/uL (ref 0.7–4.0)
MCH: 24.5 pg — ABNORMAL LOW (ref 26.0–34.0)
MCHC: 30.9 g/dL (ref 30.0–36.0)
MCV: 79.4 fL (ref 78.0–100.0)
MONO ABS: 0.5 10*3/uL (ref 0.1–1.0)
Monocytes Relative: 6 %
NEUTROS ABS: 4.8 10*3/uL (ref 1.7–7.7)
Neutrophils Relative %: 66 %
PLATELETS: 316 10*3/uL (ref 150–400)
RBC: 4.28 MIL/uL (ref 4.22–5.81)
RDW: 17.9 % — AB (ref 11.5–15.5)
WBC: 7.4 10*3/uL (ref 4.0–10.5)

## 2016-07-29 LAB — PROTIME-INR
INR: 1.06
Prothrombin Time: 13.9 seconds (ref 11.4–15.2)

## 2016-07-29 SURGERY — MULTIPLE EXTRACTION WITH ALVEOLOPLASTY
Anesthesia: General

## 2016-07-29 NOTE — Patient Instructions (Addendum)
     [place] [address] Dept: [phone] Loc: [phone]  VONZELL LINDBLAD  07/29/2016  You are scheduled for a Cardiac Catheterization on Monday, April 23 with Dr. Lauree Chandler.  1. Please arrive at the [facility] (Main Entrance A) at [facility]: [address] [place], [place] [address] at 10 :00 am (two hours before your procedure to ensure your preparation). Free valet parking service is available.   Special note: Every effort is made to have your procedure done on time. Please understand that emergencies sometimes delay scheduled procedures.  2. Diet: Do not eat or drink anything after midnight prior to your procedure except sips of water to take medications.  3. Labs: You will need to have blood drawn on Wednesday, April 18 at [facility]. 4. Medication instructions in preparation for your procedure:    Current Outpatient Prescriptions (Cardiovascular):  .  furosemide (LASIX) 40 MG tablet, Take 1 tablet (40 mg total) by mouth daily.   Current Outpatient Prescriptions (Analgesics):  .  acetaminophen (TYLENOL) 325 MG tablet, Take 650 mg by mouth every 6 (six) hours as needed for headache.  Current Outpatient Prescriptions (Hematological):  Marland Kitchen  Ferrous Sulfate 27 MG TABS, Take 27 mg by mouth daily.  Current Outpatient Prescriptions (Other):  .  cefTRIAXone (ROCEPHIN) IVPB, Inject 2 g into the vein daily. Indication:  Endocarditis Last Day of Therapy:  08/06/2016 Labs - Once weekly:  CBC/D and BMP, Labs - Every other week:  ESR and CRP .  ENSURE PLUS (ENSURE PLUS) LIQD, Take 237 mLs by mouth daily. .  Multiple Vitamin (MULTIVITAMIN WITH MINERALS) TABS tablet, Take 1 tablet by mouth daily. *For reference purposes while preparing patient instructions.   Delete this med list prior to printing instructions for patient.*    On the morning of your  procedure, take your Aspirin and any morning medicines NOT listed above.  You may use sips of water.  5. Plan for one night stay--bring personal belongings. 6. Bring a current list of your medications and current insurance cards. 7. You MUST have a responsible person to drive you home. 8. Someone MUST be with you the first 24 hours after you arrive home or your discharge will be delayed. 9. Please wear clothes that are easy to get on and off and wear slip-on shoes.  Thank you for allowing [place] to care for you!   -- [facility]

## 2016-07-31 LAB — SPECIMEN STATUS REPORT

## 2016-07-31 LAB — MAGNESIUM: MAGNESIUM: 2.5 mg/dL — AB (ref 1.6–2.3)

## 2016-08-03 ENCOUNTER — Encounter (HOSPITAL_COMMUNITY)
Admission: RE | Disposition: A | Discharge status: Home / Self Care | Payer: Self-pay | Source: Ambulatory Visit | Attending: Cardiovascular Disease

## 2016-08-03 ENCOUNTER — Encounter: Payer: Self-pay | Admitting: Cardiology

## 2016-08-03 ENCOUNTER — Ambulatory Visit (HOSPITAL_COMMUNITY)
Admission: RE | Admit: 2016-08-03 | Discharge: 2016-08-03 | Disposition: A | Discharge status: Home / Self Care | Payer: Medicare Other | Source: Ambulatory Visit | Attending: Cardiovascular Disease | Admitting: Cardiovascular Disease

## 2016-08-03 DIAGNOSIS — I38 Endocarditis, valve unspecified: Secondary | ICD-10-CM | POA: Diagnosis not present

## 2016-08-03 DIAGNOSIS — I712 Thoracic aortic aneurysm, without rupture: Secondary | ICD-10-CM | POA: Insufficient documentation

## 2016-08-03 DIAGNOSIS — I2510 Atherosclerotic heart disease of native coronary artery without angina pectoris: Secondary | ICD-10-CM | POA: Diagnosis not present

## 2016-08-03 DIAGNOSIS — D649 Anemia, unspecified: Secondary | ICD-10-CM | POA: Insufficient documentation

## 2016-08-03 DIAGNOSIS — K760 Fatty (change of) liver, not elsewhere classified: Secondary | ICD-10-CM | POA: Diagnosis not present

## 2016-08-03 DIAGNOSIS — Z8249 Family history of ischemic heart disease and other diseases of the circulatory system: Secondary | ICD-10-CM | POA: Diagnosis not present

## 2016-08-03 DIAGNOSIS — Q231 Congenital insufficiency of aortic valve: Secondary | ICD-10-CM | POA: Diagnosis not present

## 2016-08-03 DIAGNOSIS — R0789 Other chest pain: Secondary | ICD-10-CM

## 2016-08-03 DIAGNOSIS — Z7982 Long term (current) use of aspirin: Secondary | ICD-10-CM | POA: Diagnosis not present

## 2016-08-03 DIAGNOSIS — Z87891 Personal history of nicotine dependence: Secondary | ICD-10-CM | POA: Diagnosis not present

## 2016-08-03 DIAGNOSIS — I359 Nonrheumatic aortic valve disorder, unspecified: Secondary | ICD-10-CM

## 2016-08-03 HISTORY — PX: RIGHT/LEFT HEART CATH AND CORONARY ANGIOGRAPHY: CATH118266

## 2016-08-03 LAB — POCT I-STAT 3, ART BLOOD GAS (G3+)
Bicarbonate: 23.1 mmol/L (ref 20.0–28.0)
O2 SAT: 96 %
TCO2: 24 mmol/L (ref 0–100)
pCO2 arterial: 31.4 mmHg — ABNORMAL LOW (ref 32.0–48.0)
pH, Arterial: 7.475 — ABNORMAL HIGH (ref 7.350–7.450)
pO2, Arterial: 78 mmHg — ABNORMAL LOW (ref 83.0–108.0)

## 2016-08-03 LAB — POCT I-STAT 3, VENOUS BLOOD GAS (G3P V)
Bicarbonate: 23.7 mmol/L (ref 20.0–28.0)
O2 Saturation: 63 %
PCO2 VEN: 35.1 mmHg — AB (ref 44.0–60.0)
PH VEN: 7.439 — AB (ref 7.250–7.430)
TCO2: 25 mmol/L (ref 0–100)
pO2, Ven: 31 mmHg — CL (ref 32.0–45.0)

## 2016-08-03 SURGERY — RIGHT/LEFT HEART CATH AND CORONARY ANGIOGRAPHY
Anesthesia: LOCAL

## 2016-08-03 MED ORDER — SODIUM CHLORIDE 0.9 % IV SOLN: 250.0000 mL | INTRAVENOUS | Status: DC | PRN

## 2016-08-03 MED ORDER — ASPIRIN 81 MG PO CHEW
81.0000 mg | CHEWABLE_TABLET | ORAL | Status: AC
  Administered 2016-08-03: 81 mg via ORAL

## 2016-08-03 MED ORDER — FENTANYL CITRATE (PF) 100 MCG/2ML IJ SOLN
INTRAMUSCULAR | Status: AC
  Filled 2016-08-03: qty 2

## 2016-08-03 MED ORDER — VERAPAMIL HCL 2.5 MG/ML IV SOLN
INTRAVENOUS | Status: DC | PRN
  Administered 2016-08-03: 10 mL via INTRA_ARTERIAL

## 2016-08-03 MED ORDER — SODIUM CHLORIDE 0.9% FLUSH: 3.0000 mL | Freq: Two times a day (BID) | INTRAVENOUS | Status: DC

## 2016-08-03 MED ORDER — IOPAMIDOL (ISOVUE-370) INJECTION 76%
INTRAVENOUS | Status: DC | PRN
  Administered 2016-08-03: 150 mL via INTRA_ARTERIAL

## 2016-08-03 MED ORDER — HEPARIN (PORCINE) IN NACL 2-0.9 UNIT/ML-% IJ SOLN
INTRAMUSCULAR | Status: DC | PRN
  Administered 2016-08-03: 1000 mL via INTRA_ARTERIAL

## 2016-08-03 MED ORDER — LIDOCAINE HCL (PF) 1 % IJ SOLN
INTRAMUSCULAR | Status: DC | PRN
  Administered 2016-08-03: 12 mL via INTRADERMAL
  Administered 2016-08-03: 2 mL via INTRADERMAL
  Administered 2016-08-03: 12 mL via INTRADERMAL

## 2016-08-03 MED ORDER — MIDAZOLAM HCL 2 MG/2ML IJ SOLN
INTRAMUSCULAR | Status: DC | PRN
  Administered 2016-08-03: 2 mg via INTRAVENOUS
  Administered 2016-08-03: 1 mg via INTRAVENOUS

## 2016-08-03 MED ORDER — FENTANYL CITRATE (PF) 100 MCG/2ML IJ SOLN
INTRAMUSCULAR | Status: DC | PRN
  Administered 2016-08-03 (×2): 50 ug via INTRAVENOUS

## 2016-08-03 MED ORDER — HEPARIN (PORCINE) IN NACL 2-0.9 UNIT/ML-% IJ SOLN
INTRAMUSCULAR | Status: AC
  Filled 2016-08-03: qty 1000

## 2016-08-03 MED ORDER — SODIUM CHLORIDE 0.9% FLUSH: 3.0000 mL | INTRAVENOUS | Status: DC | PRN

## 2016-08-03 MED ORDER — SODIUM CHLORIDE 0.9 % IV SOLN: INTRAVENOUS | Status: AC

## 2016-08-03 MED ORDER — MIDAZOLAM HCL 2 MG/2ML IJ SOLN
INTRAMUSCULAR | Status: AC
  Filled 2016-08-03: qty 2

## 2016-08-03 MED ORDER — ASPIRIN 81 MG PO CHEW
CHEWABLE_TABLET | ORAL | Status: AC
  Administered 2016-08-03: 81 mg via ORAL
  Filled 2016-08-03: qty 1

## 2016-08-03 MED ORDER — VERAPAMIL HCL 2.5 MG/ML IV SOLN
INTRAVENOUS | Status: AC
  Filled 2016-08-03: qty 2

## 2016-08-03 MED ORDER — LIDOCAINE HCL (PF) 1 % IJ SOLN
INTRAMUSCULAR | Status: AC
  Filled 2016-08-03: qty 30

## 2016-08-03 MED ORDER — SODIUM CHLORIDE 0.9 % IV SOLN
INTRAVENOUS | Status: DC
  Administered 2016-08-03: 12:00:00 via INTRAVENOUS

## 2016-08-03 SURGICAL SUPPLY — 20 items
CATH 5FR JL3.5 JR4 ANG PIG MP (CATHETERS) ×2 IMPLANT
CATH INFINITI 5 FR JL6.0 (CATHETERS) ×2 IMPLANT
CATH INFINITI 5F JL4 125CM (CATHETERS) ×2 IMPLANT
CATH INFINITI 5FR AL1 (CATHETERS) ×2 IMPLANT
CATH INFINITI 5FR JL5 (CATHETERS) ×2 IMPLANT
CATH SWAN GANZ 7F STRAIGHT (CATHETERS) ×2 IMPLANT
CATH VISTA GUIDE 6FR XBLAD3.5 (CATHETERS) ×2 IMPLANT
DEVICE RAD COMP TR BAND LRG (VASCULAR PRODUCTS) ×2 IMPLANT
GLIDESHEATH SLEND SS 6F .021 (SHEATH) ×2 IMPLANT
GUIDEWIRE INQWIRE 1.5J.035X260 (WIRE) ×1 IMPLANT
INQWIRE 1.5J .035X260CM (WIRE) ×2
KIT HEART LEFT (KITS) ×2 IMPLANT
PACK CARDIAC CATHETERIZATION (CUSTOM PROCEDURE TRAY) ×2 IMPLANT
SHEATH GLIDE SLENDER 4/5FR (SHEATH) ×2 IMPLANT
SHEATH PINNACLE 5F 10CM (SHEATH) ×2 IMPLANT
SHEATH PINNACLE 7F 10CM (SHEATH) ×2 IMPLANT
TRANSDUCER W/STOPCOCK (MISCELLANEOUS) ×2 IMPLANT
TUBING ART PRESS 72  MALE/FEM (TUBING) ×1
TUBING ART PRESS 72 MALE/FEM (TUBING) ×1 IMPLANT
TUBING CIL FLEX 10 FLL-RA (TUBING) ×2 IMPLANT

## 2016-08-03 NOTE — Telephone Encounter (Signed)
This refers to changes related to arthritis.

## 2016-08-03 NOTE — Progress Notes (Signed)
Site area:  Rt fem art and rt fem venous Site Prior to Removal:  Level 0 Pressure Applied For: 30 min Manual:   yes Patient Status During Pull:  A/O Post Pull Site:  Level 0  No hematoma is groin is soft. Post Pull Instructions Given:  Yes Post Pull Pulses Present: 2+ rt dp Dressing Applied:  tegaderm and 4x4 Bedrest begins @ 14:30:00 Comments: Pt leaves cath lab holding in stable condition. Rt groin is unremarkable. No bruising or hematoma noted.  Sheaths pulled by Burton Apley RRT

## 2016-08-03 NOTE — H&P (View-Only) (Signed)
Cardiology Office Note  Date: 07/29/2016   ID: Charles [name], DOB Jun 12, 1945, MRN [id number]  PCP: Wende Neighbors, MD  Evaluating Cardiologist: Rozann Lesches, MD   Chief Complaint  Patient presents with  . Aortic valve disease  . Recent endocarditis  . Chest Pain    History of Present Illness: Charles [name] is a medically complex 71 y.o. male presenting for a follow-up office visit and for preoperative clearance. This is my first meeting with him today. I reviewed extensive records and updated his chart. He was recently hospitalized in March with Viridans streptococcal bacteremia and endocarditis. He had no definitive valvular vegetation by TEE but at baseline has a bicuspid aortic valve and had severe aortic regurgitation. He was seen by ID service with plan for 4 weeks IV antibiotics. 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 His note from March 28 indicated concern that there may have been some component of leaflet perforation. Patient was to undergo dental work and also cardiac catheterization. He was seen in the office by Ms. Lawrence NP following discharge on April 12.  My understanding in reviewing the chart and speaking with him today is that he was to undergo dental work earlier this morning, however the procedure was canceled by Dr. Enrique Sack given concerns about reported chest pain. Patient states that over the last 3-4 weeks he has had intermittent episodes of chest pain, lasting anywhere from seconds to a few minutes, no precipitant, generally sharp in quality, and not specifically relieved by anything in particular. He has had no palpitations during this time. He is currently wearing a heart monitor to exclude any specific arrhythmias or progressive conduction system disease. Patient followed up with Dr. Megan Salon yesterday in the ID clinic, has just completed 4 weeks of IV antibiotics and is to have his  PICC line removed tomorrow. He does not report any fevers or chills. Reports steadily improving appetite.  Today we had a long discussion regarding his recent hospitalization and general plan moving toward anticipated AVR with Dr. Roxy Manns. We discussed proceeding with a diagnostic cardiac catheterization as a next step, now that he has completed IV antibiotics, for clear assessment of coronary anatomy particularly in light of his chest pain symptoms, although these are fairly atypical. We discussed the risks and benefits, and he is in agreement to proceed.  Past Medical History:  Diagnosis Date  . Anemia   . Aortic insufficiency    a. 07/07/2016: TEE showing bicuspid aortic valve with severe eccentric AI   . Ascending aortic aneurysm (HCC)    4.4 cm by CT 06/2016  . 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 Bicuspid aortic valve     Past Surgical History:  Procedure Laterality Date  . HERNIA REPAIR Left   . TEE WITHOUT CARDIOVERSION N/A 07/07/2016   Procedure: TRANSESOPHAGEAL ECHOCARDIOGRAM (TEE);  Surgeon: Larey Dresser, MD;  Location: [facility] ENDOSCOPY;  Service: Cardiovascular;  Laterality: N/A;    Current Outpatient Prescriptions  Medication Sig Dispense Refill  . acetaminophen (TYLENOL) 325 MG tablet Take 650 mg by mouth every 6 (six) hours as needed for headache.    Marland Kitchen aspirin EC 81 MG tablet Take 81 mg by mouth daily.    . cefTRIAXone (ROCEPHIN) IVPB Inject 2 g into the vein daily. Indication:  Endocarditis Last Day of Therapy:  08/06/2016 Labs - Once weekly:  CBC/D and BMP,  Labs - Every other week:  ESR and CRP 27 Units 0  . ENSURE PLUS (ENSURE PLUS) LIQD Take 237 mLs by mouth daily.    . Ferrous Sulfate 27 MG TABS Take 27 mg by mouth daily.    . furosemide (LASIX) 40 MG tablet Take 1 tablet (40 mg total) by mouth daily. 30 tablet 2  . Multiple Vitamin (MULTIVITAMIN WITH MINERALS) TABS tablet Take 1 tablet by mouth daily.     No current facility-administered  medications for this visit.    Allergies:  No known allergies   Social History: The patient  reports that he quit smoking about 51 years ago. His smoking use included Cigarettes. He has never used smokeless tobacco. He reports that he does not drink alcohol or use drugs.   Family History: The patient's family history includes Heart disease (age of onset: 29) in his father; Ovarian cancer in his mother.   ROS:  Please see the history of present illness. Otherwise, complete review of systems is positive for improving appetite and fatigue.  All other systems are reviewed and negative.   Physical Exam: VS:  BP (!) 128/58   Pulse 77   Ht 6' (1.829 m)   Wt 229 lb (103.9 kg)   SpO2 97%   BMI 31.06 kg/m , BMI Body mass index is 31.06 kg/m.  Wt Readings from Last 3 Encounters:  07/29/16 229 lb (103.9 kg)  07/28/16 231 lb (104.8 kg)  07/23/16 227 lb (103 kg)    General: Obese male, appears comfortable at rest. HEENT: Conjunctiva and lids normal, oropharynx clear. Neck: Supple, no elevated JVP or carotid bruits, no thyromegaly. Lungs: Clear to auscultation, nonlabored breathing at rest. Cardiac: Regular rate and rhythm, no S3, 2/6 systolic murmur and 3/6 diastolic murmur, no pericardial rub. Abdomen: Soft, nontender, bowel sounds present, no guarding or rebound. Extremities: No pitting edema, distal pulses 2+. Skin: Warm and dry. Musculoskeletal: No kyphosis. Neuropsychiatric: Alert and oriented x3, affect grossly appropriate.  ECG: I personally reviewed the tracing from 07/13/2016 which showed sinus rhythm with prolonged PR interval, left anterior fascicular block, and increased voltage.  Recent Labwork: 07/01/2016: TSH 1.300 07/05/2016: ALT 17; AST 16 07/07/2016: Hemoglobin 8.3; Magnesium 2.0; Platelets 252 07/08/2016: BUN 14; Potassium 4.3; Sodium 138 07/09/2016: Creatinine, Ser 0.98     Component Value Date/Time   CHOL 122 07/03/2016 0715   TRIG 125 07/03/2016 0715   HDL 16 (L)  07/03/2016 0715   CHOLHDL 7.6 07/03/2016 0715   VLDL 25 07/03/2016 0715   LDLCALC 81 07/03/2016 0715    Other Studies Reviewed Today:  Chest CTA 07/02/2016: IMPRESSION: 1. No acute intrathoracic pathology. 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 Recommend annual imaging followup by CTA or MRA. This recommendation follows 2010 ACCF/AHA/AATS/ACR/ASA/SCA/SCAI/SIR/STS/SVM Guidelines for the Diagnosis and Management of Patients with Thoracic Aortic Disease. Circulation. 2010; 121: I433-I951 2. Fatty liver. A small hypodense lesion in the caudate lobe is indeterminate but likely represents a cyst or hemangioma. 3. Probable right adrenal adenoma.  TEE 07/07/2016: Study Conclusions  - Left ventricle: The cavity size was moderately dilated. Wall   thickness was normal. Systolic function was normal. The estimated   ejection fraction was in the range of 60% to 65%. Wall motion was   normal; there were no regional wall motion abnormalities. - Aortic valve: The aortic valve was bicuspid. There was eccentric   aortic insufficiency that appeared severe. There was no aortic   stenosis. No definite vegetation  seen. There was a small area of   thickening with possible potential space at the valve base   adjacent to the RV. The AI appeared to originate from this area.   However, I am not convinced this is a vegetation or abscess. - Aorta: The aortic root was dilated to to 4.7 cm with a 4.4 cm   ascending aorta. Diastolic flow reversal noted in the ascending   thoracic aorta. - Mitral valve: No evidence of vegetation. There was mild   regurgitation. - Left atrium: No evidence of thrombus in the atrial cavity or   appendage. - Right ventricle: The cavity size was normal. Systolic function   was normal. - Right atrium: No evidence of thrombus in the atrial cavity or   appendage. - Atrial septum: No defect or patent foramen ovale was identified. - Tricuspid valve: No  evidence of vegetation. - Pulmonic valve: No evidence of vegetation.  Impressions:  - 1. Moderately dilated LV with normal systolic function.   2. Bicuspid aortic valve with severe eccentric aortic   insufficiency. No definite infection seen.   3. Moderately dilated aortic root and ascending aorta.  Assessment and Plan:  1. Recurring atypical chest pain as outlined above. Patient otherwise has been steadily improving in terms of weakness and improved appetite. He completed a four-week course of IV antibiotics today and will have his PICC line removed tomorrow. Dental work was canceled given concerns about his chest pain. After reviewing the situation and plan, we will move ahead with arranging a diagnostic cardiac catheterization for early next week to clearly evaluate coronary anatomy. Results need to be conveyed to Dr. Roxy Manns who will be seeing the patient within the next few weeks. If coronary anatomy is reassuring, dental work should be rescheduled as well.  2. Viridans streptococcal bacteremia and endocarditis. Patient had follow-up with Dr. Megan Salon yesterday he has completed 4 weeks of IV antibiotics. PICC line to be removed tomorrow. No fevers or chills.  3. Bicuspid aortic valve with severe eccentric aortic regurgitation. No definitive valvular vegetation by TEE although endocarditis still suspected. Patient has follow-up with Dr. Roxy Manns in the next few weeks to discuss AVR.  4. Ascending aortic aneurysm measuring 4.4 cm by recent CTA.  5. Remote history of tobacco use. No obvious chronic lung disease.  Current medicines were reviewed with the patient today.   Orders Placed This Encounter  Procedures  . CBC w/Diff/Platelet  . Basic Metabolic Panel (BMET)  . INR/PT    Disposition: Follow-up after cardiac catheterization.  Signed, Satira Sark, MD, [facility] 07/29/2016 3:56 PM    [place] [facility] [address], [place], [place]  [address] Phone: [phone]; Fax: [phone]

## 2016-08-03 NOTE — Interval H&P Note (Signed)
History and Physical Interval Note:  08/03/2016 12:10 PM  Charles [name]  has presented today for cardiac cath with the diagnosis of aortic insufficiency. The various methods of treatment have been discussed with the patient and family. After consideration of risks, benefits and other options for treatment, the patient has consented to  Procedure(s): Left Heart Cath and Coronary Angiography (N/A) as a surgical intervention .  The patient's history has been reviewed, patient examined, no change in status, stable for surgery.  I have reviewed the patient's chart and labs.  Questions were answered to the patient's satisfaction.   Cath Lab Visit (complete for each Cath Lab visit)  Clinical Evaluation Leading to the Procedure:   ACS: No.  Non-ACS:    Anginal Classification: No Symptoms  Anti-ischemic medical therapy: No Therapy  Non-Invasive Test Results: No non-invasive testing performed  Prior CABG: No previous CABG          Lauree Chandler

## 2016-08-03 NOTE — Discharge Instructions (Signed)
Radial Site Care Refer to this sheet in the next few weeks. These instructions provide you with information about caring for yourself after your procedure. Your health care provider may also give you more specific instructions. Your treatment has been planned according to current medical practices, but problems sometimes occur. Call your health care provider if you have any problems or questions after your procedure. What can I expect after the procedure? After your procedure, it is typical to have the following:  Bruising at the radial site that usually fades within 1-2 weeks.  Blood collecting in the tissue (hematoma) that may be painful to the touch. It should usually decrease in size and tenderness within 1-2 weeks. Follow these instructions at home:  Take medicines only as directed by your health care provider.  You may shower 24-48 hours after the procedure or as directed by your health care provider. Remove the bandage (dressing) and gently wash the site with plain soap and water. Pat the area dry with a clean towel. Do not rub the site, because this may cause bleeding.  Do not take baths, swim, or use a hot tub until your health care provider approves.  Check your insertion site every day for redness, swelling, or drainage.  Do not apply powder or lotion to the site.  Do not flex or bend the affected arm for 24 hours or as directed by your health care provider.  Do not push or pull heavy objects with the affected arm for 24 hours or as directed by your health care provider.  Do not lift over 10 lb (4.5 kg) for 5 days after your procedure or as directed by your health care provider.  Ask your health care provider when it is okay to:  Return to work or school.  Resume usual physical activities or sports.  Resume sexual activity.  Do not drive home if you are discharged the same day as the procedure. Have someone else drive you.  You may drive 24 hours after the procedure  unless otherwise instructed by your health care provider.  Do not operate machinery or power tools for 24 hours after the procedure.  If your procedure was done as an outpatient procedure, which means that you went home the same day as your procedure, a responsible adult should be with you for the first 24 hours after you arrive home.  Keep all follow-up visits as directed by your health care provider. This is important. Contact a health care provider if:  You have a fever.  You have chills.  You have increased bleeding from the radial site. Hold pressure on the site. Get help right away if:  You have unusual pain at the radial site.  You have redness, warmth, or swelling at the radial site.  You have drainage (other than a small amount of blood on the dressing) from the radial site.  The radial site is bleeding, and the bleeding does not stop after 30 minutes of holding steady pressure on the site.  Your arm or hand becomes pale, cool, tingly, or numb.  This information is not intended to replace advice given to you by your health care provider. Make sure you discuss any questions you have with your health care provider. Document Released: 05/02/2010 Document Revised: 09/05/2015 Document Reviewed: 10/16/2013     Femoral Site Care Refer to this sheet in the next few weeks. These instructions provide you with information about caring for yourself after your procedure. Your health care provider may also give you more specific instructions. Your treatment has been planned according to current medical practices, but problems sometimes occur. Call your health care provider if you have any problems or questions after your procedure. What can I expect after the procedure? After your procedure, it is typical to have the following:  Bruising at the site that usually fades within 1-2  weeks.  Blood collecting in the tissue (hematoma) that may be painful to the touch. It should usually decrease in size and tenderness within 1-2 weeks. Follow these instructions at home:  Take medicines only as directed by your health care provider.  You may shower 24-48 hours after the procedure or as directed by your health care provider. Remove the bandage (dressing) and gently wash the site with plain soap and water. Pat the area dry with a clean towel. Do not rub the site, because this may cause bleeding.  Do not take baths, swim, or use a hot tub until your health care provider approves.  Check your insertion site every day for redness, swelling, or drainage.  Do not apply powder or lotion to the site.  Limit use of stairs to twice a day for the first 2-3 days or as directed by your health care provider.  Do not squat for the first 2-3 days or as directed by your health care provider.  Do not lift over 10 lb (4.5 kg) for 5 days after your procedure or as directed by your health care provider.  Ask your health care provider when it is okay to:  Return to work or school.  Resume usual physical activities or sports.  Resume sexual activity.  Do not drive home if you are discharged the same day as the procedure. Have someone else drive you.  You may drive 24 hours after the procedure unless otherwise instructed by your health care provider.  Do not operate machinery or power tools for 24 hours after the procedure or as directed by your health care provider.  If your procedure was done as an outpatient procedure, which means that you went home the same day as your procedure, a responsible adult should be with you for the first 24 hours after you arrive home.  Keep all follow-up visits as directed by your health care provider. This is important. Contact a health care provider if:  You have a fever.  You have chills.  You have increased bleeding from the site. Hold pressure  on the site. Get help right away if:  You have unusual pain at the site.  You have redness, warmth, or swelling at the site.  You have drainage (other than a small amount of blood on the dressing) from the site.  The site is bleeding, and the bleeding does not stop after 30 minutes of holding steady pressure on the site.  Your leg or foot becomes pale, cool, tingly, or numb. This information is not intended to replace advice given to you by your health care provider. Make sure you discuss any questions you have with your health care provider. Document Released: 12/01/2013  Document Revised: 09/05/2015 Document Reviewed: 10/17/2013 Elsevier Interactive Patient Education  2017 [place] Interactive Patient Education  [employer]. Refer to this sheet in the next few weeks. These instructions provide you with information about caring for yourself after your procedure. Your health care provider may also give you more specific instructions. Your treatment has been planned according to current medical practices, but problems sometimes occur. Call your health care provider if you have any problems or questions after your procedure. What can I expect after the procedure? After your procedure, it is typical to have the following:  Bruising at the site that usually fades within 1-2 weeks.  Blood collecting in the tissue (hematoma) that may be painful to the touch. It should usually decrease in size and tenderness within 1-2 weeks. Follow these instructions at home:  Take medicines only as directed by your health care provider.  You may shower 24-48 hours after the procedure or as directed by your health care provider. Remove the bandage (dressing) and gently wash the site with plain soap and water. Pat the area dry with a clean  towel. Do not rub the site, because this may cause bleeding.  Do not take baths, swim, or use a hot tub until your health care provider approves.  Check your insertion site every day for redness, swelling, or drainage.  Do not apply powder or lotion to the site.  Limit use of stairs to twice a day for the first 2-3 days or as directed by your health care provider.  Do not squat for the first 2-3 days or as directed by your health care provider.  Do not lift over 10 lb (4.5 kg) for 5 days after your procedure or as directed by your health care provider.  Ask your health care provider when it is okay to:  Return to work or school.  Resume usual physical activities or sports.  Resume sexual activity.  Do not drive home if you are discharged the same day as the procedure. Have someone else drive you.  You may drive 24 hours after the procedure unless otherwise instructed by your health care provider.  Do not operate machinery or power tools for 24 hours after the procedure or as directed by your health care provider.  If your procedure was done as an outpatient procedure, which means that you went home the same day as your procedure, a responsible adult should be with you for the first 24 hours after you arrive home.  Keep all follow-up visits as directed by your health care provider. This is important. Contact a health care provider if:  You have a fever.  You have chills.  You have increased bleeding from the site. Hold pressure on the site. Get help right away if:  You have unusual pain at the site.  You have redness, warmth, or swelling at the site.  You have drainage (other than a small amount of blood on the dressing) from the site.  The site is bleeding, and the bleeding does not stop  after 30 minutes of holding steady pressure on the site.  Your leg or foot becomes pale, cool, tingly, or numb. This information is not intended to replace advice given to you by your  health care provider. Make sure you discuss any questions you have with your health care provider. Document Released: 12/01/2013 Document Revised: 09/05/2015 Document Reviewed: 10/17/2013 Elsevier Interactive Patient Education  2017 [employer].

## 2016-08-04 ENCOUNTER — Encounter (HOSPITAL_COMMUNITY): Payer: Self-pay | Admitting: Cardiovascular Disease

## 2016-08-07 ENCOUNTER — Emergency Department (HOSPITAL_COMMUNITY): Payer: Medicare Other

## 2016-08-07 ENCOUNTER — Emergency Department (HOSPITAL_COMMUNITY)
Admission: EM | Admit: 2016-08-07 | Discharge: 2016-08-07 | Disposition: A | Discharge status: Home / Self Care | Payer: Medicare Other | Attending: Emergency Medicine | Admitting: Emergency Medicine

## 2016-08-07 ENCOUNTER — Encounter (HOSPITAL_COMMUNITY): Payer: Self-pay

## 2016-08-07 DIAGNOSIS — I2510 Atherosclerotic heart disease of native coronary artery without angina pectoris: Secondary | ICD-10-CM | POA: Diagnosis not present

## 2016-08-07 DIAGNOSIS — K5901 Slow transit constipation: Secondary | ICD-10-CM

## 2016-08-07 DIAGNOSIS — Z7982 Long term (current) use of aspirin: Secondary | ICD-10-CM | POA: Diagnosis not present

## 2016-08-07 DIAGNOSIS — Z87891 Personal history of nicotine dependence: Secondary | ICD-10-CM | POA: Diagnosis not present

## 2016-08-07 DIAGNOSIS — Z79899 Other long term (current) drug therapy: Secondary | ICD-10-CM | POA: Diagnosis not present

## 2016-08-07 DIAGNOSIS — K5900 Constipation, unspecified: Secondary | ICD-10-CM | POA: Diagnosis not present

## 2016-08-07 LAB — URINALYSIS, ROUTINE W REFLEX MICROSCOPIC
Bilirubin Urine: NEGATIVE
GLUCOSE, UA: NEGATIVE mg/dL
KETONES UR: NEGATIVE mg/dL
LEUKOCYTES UA: NEGATIVE
NITRITE: NEGATIVE
PROTEIN: NEGATIVE mg/dL
Specific Gravity, Urine: 1.021 (ref 1.005–1.030)
pH: 5 (ref 5.0–8.0)

## 2016-08-07 LAB — COMPREHENSIVE METABOLIC PANEL
ALT: 11 U/L — ABNORMAL LOW (ref 17–63)
AST: 16 U/L (ref 15–41)
Albumin: 3.5 g/dL (ref 3.5–5.0)
Alkaline Phosphatase: 95 U/L (ref 38–126)
Anion gap: 9 (ref 5–15)
BUN: 22 mg/dL — AB (ref 6–20)
CHLORIDE: 102 mmol/L (ref 101–111)
CO2: 26 mmol/L (ref 22–32)
Calcium: 8.9 mg/dL (ref 8.9–10.3)
Creatinine, Ser: 1.17 mg/dL (ref 0.61–1.24)
GFR calc Af Amer: >60 mL/min (ref >60)
Glucose, Bld: 110 mg/dL — ABNORMAL HIGH (ref 65–99)
POTASSIUM: 3.7 mmol/L (ref 3.5–5.1)
Sodium: 137 mmol/L (ref 135–145)
Total Bilirubin: 0.4 mg/dL (ref 0.3–1.2)
Total Protein: 7.8 g/dL (ref 6.5–8.1)

## 2016-08-07 LAB — CBC WITH DIFFERENTIAL/PLATELET
BASOS ABS: 0 10*3/uL (ref 0.0–0.1)
BASOS PCT: 0 %
Eosinophils Absolute: 0.3 10*3/uL (ref 0.0–0.7)
Eosinophils Relative: 3 %
HEMATOCRIT: 33.2 % — AB (ref 39.0–52.0)
HEMOGLOBIN: 10.5 g/dL — AB (ref 13.0–17.0)
LYMPHS PCT: 21 %
Lymphs Abs: 2.1 10*3/uL (ref 0.7–4.0)
MCH: 25.4 pg — ABNORMAL LOW (ref 26.0–34.0)
MCHC: 31.6 g/dL (ref 30.0–36.0)
MCV: 80.2 fL (ref 78.0–100.0)
MONOS PCT: 6 %
Monocytes Absolute: 0.6 10*3/uL (ref 0.1–1.0)
NEUTROS PCT: 70 %
Neutro Abs: 6.8 10*3/uL (ref 1.7–7.7)
Platelets: 268 10*3/uL (ref 150–400)
RBC: 4.14 MIL/uL — ABNORMAL LOW (ref 4.22–5.81)
RDW: 18 % — ABNORMAL HIGH (ref 11.5–15.5)
WBC: 9.8 10*3/uL (ref 4.0–10.5)

## 2016-08-07 MED ORDER — PEG 3350-KCL-NABCB-NACL-NASULF 236 G PO SOLR: ORAL | 0 refills | Status: DC

## 2016-08-07 NOTE — ED Triage Notes (Signed)
Pt reports that he has been unable to have a BM for the last 3 days due to hard stool and being painful. Pt has taken MOM, stool softener, suppository

## 2016-08-07 NOTE — ED Notes (Signed)
Pt states rectal pain and pressure, feels like he has to have a bowel movement but unable to go for the past 3 days.

## 2016-08-07 NOTE — ED Triage Notes (Signed)
Pt reports three day history of abd pain  Dr Nevada Crane PCP

## 2016-08-07 NOTE — ED Notes (Signed)
Pt alert & oriented x4, stable gait. Patient given discharge instructions, paperwork & prescription(s). Patient  instructed to stop at the registration desk to finish any additional paperwork. Patient verbalized understanding. Pt left department w/ no further questions. 

## 2016-08-07 NOTE — Discharge Instructions (Signed)
Drink plenty of water.  Return here for any worsening symptoms such as fever, vomiting or increasing abdominal pain

## 2016-08-11 ENCOUNTER — Telehealth: Payer: Self-pay | Admitting: Cardiology

## 2016-08-11 ENCOUNTER — Other Ambulatory Visit (HOSPITAL_COMMUNITY): Payer: Self-pay | Admitting: Dentistry

## 2016-08-11 NOTE — Telephone Encounter (Signed)
Patient calling regarding clearance for tooth extraction. / tg

## 2016-08-11 NOTE — Telephone Encounter (Signed)
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 This will need to be addressed with bypass of the time of AVR. Overall risk is in the intermediate range, but it looks like he needs to proceed with dental procedure so that plan can move forward regarding cardiac surgery.

## 2016-08-11 NOTE — ED Provider Notes (Signed)
[facility] DEPT Provider Note   CSN: [id number] Arrival date & time: 08/07/16  1816     History   Chief Complaint Chief Complaint  Patient presents with  . Constipation    HPI Charles [name] is a 71 y.o. male.  HPI  Charles [name] is a 71 y.o. male who presents to the Emergency Department complaining of constipation for 3 days.  He reports having urge to defecate but develops sharp pain to his rectal area and then inability to have a BM.  He has tried stool softener, MOM, and suppository without relief.  He denies vomiting, recent abdominal surgery, chest pain and abdominal pain  Past Medical History:  Diagnosis Date  . Anemia   . Aortic insufficiency    a. 07/07/2016: TEE showing bicuspid aortic valve with severe eccentric AI   . Ascending aortic aneurysm (HCC)    4.4 cm by CT 06/2016  . 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 Bicuspid aortic valve     Patient Active Problem List   Diagnosis Date Noted  . Aortic valve disorder   . Coronary artery disease involving native coronary artery of native heart without angina pectoris   . Second degree AV block, Mobitz type I 07/08/2016  . Weight loss   . Streptococcal endocarditis   . Protein-calorie malnutrition, severe 07/03/2016  . Anemia 07/02/2016  . H/O cardiac murmur 07/02/2016  . Ascending aortic aneurysm ([place]) 07/02/2016  . NSTEMI (non-ST elevated myocardial infarction) ([place]) 07/02/2016  . Bicuspid aortic valve 07/02/2016  . Aortic insufficiency 07/02/2016    Past Surgical History:  Procedure Laterality Date  . HERNIA REPAIR Left   . RIGHT/LEFT HEART CATH AND CORONARY ANGIOGRAPHY N/A 08/03/2016   Procedure: Right/Left Heart Cath and Coronary Angiography;  Surgeon: Burnell Blanks, MD;  Location: [facility];  Service: Cardiovascular;  Laterality: N/A;  . TEE WITHOUT CARDIOVERSION N/A 07/07/2016   Procedure: TRANSESOPHAGEAL ECHOCARDIOGRAM (TEE);  Surgeon: Larey Dresser,  MD;  Location: [facility];  Service: Cardiovascular;  Laterality: N/A;       Home Medications    Prior to Admission medications   Medication Sig Start Date End Date Taking? Authorizing Provider  acetaminophen (TYLENOL) 325 MG tablet Take 650 mg by mouth every 6 (six) hours as needed for headache.   Yes Historical Provider, MD  aspirin EC 81 MG tablet Take 81 mg by mouth daily.   Yes Historical Provider, MD  ENSURE PLUS (ENSURE PLUS) LIQD Take 237 mLs by mouth daily.   Yes Historical Provider, MD  Ferrous Sulfate 27 MG TABS Take 27 mg by mouth daily.   Yes Historical Provider, MD  furosemide (LASIX) 40 MG tablet Take 1 tablet (40 mg total) by mouth daily. 07/13/16 10/11/16 Yes Erma Heritage, PA-C  Multiple Vitamin (MULTIVITAMIN WITH MINERALS) TABS tablet Take 1 tablet by mouth daily.   Yes Historical Provider, MD  polyethylene glycol (GOLYTELY) 236 g solution Mix as directed with water.  Drink 8 oz glass every 15-20 minutes until stools are clear 08/07/16   Kem Parkinson, PA-C    Family History Family History  Problem Relation Age of Onset  . Ovarian cancer Mother   . Heart disease Father 72    Died of coronary thrombosis.      Social History Social History  Substance Use Topics  . Smoking status: Former Smoker    Types: Cigarettes    Quit date: 04/30/1965  . Smokeless tobacco: Never Used  Comment: Quit 30 years ago.    . Alcohol use No     Allergies   Patient has no known allergies.   Review of Systems Review of Systems  Constitutional: Negative for appetite change, chills and fever.  Respiratory: Negative for shortness of breath.   Cardiovascular: Negative for chest pain.  Gastrointestinal: Positive for constipation. Negative for abdominal distention, abdominal pain, blood in stool, nausea and vomiting.  Genitourinary: Negative for decreased urine volume, difficulty urinating, dysuria and flank pain.  Musculoskeletal: Negative for back pain.  Skin: Negative  for color change and rash.  Neurological: Negative for dizziness, weakness and numbness.  Hematological: Negative for adenopathy.  All other systems reviewed and are negative.    Physical Exam Updated Vital Signs BP (!) 127/59 (BP Location: Left Arm)   Pulse 94   Temp 98.6 F (37 C) (Oral)   Resp 16   Wt 103.4 kg   SpO2 95%   BMI 30.92 kg/m   Physical Exam  Constitutional: He is oriented to person, place, and time. He appears well-developed and well-nourished. No distress.  HENT:  Head: Normocephalic and atraumatic.  Mouth/Throat: Oropharynx is clear and moist.  Cardiovascular: Normal rate, regular rhythm, normal heart sounds and intact distal pulses.   No murmur heard. Pulmonary/Chest: Effort normal and breath sounds normal. No respiratory distress.  Abdominal: Soft. Bowel sounds are normal. He exhibits no distension and no mass. There is no tenderness. There is no rebound and no guarding.  Musculoskeletal: Normal range of motion. He exhibits no edema.  Neurological: He is alert and oriented to person, place, and time. He exhibits normal muscle tone. Coordination normal.  Skin: Skin is warm and dry.  Nursing note and vitals reviewed.    ED Treatments / Results  Labs (all labs ordered are listed, but only abnormal results are displayed) Labs Reviewed  COMPREHENSIVE METABOLIC PANEL - Abnormal; Notable for the following:       Result Value   Glucose, Bld 110 (*)    BUN 22 (*)    ALT 11 (*)    All other components within normal limits  CBC WITH DIFFERENTIAL/PLATELET - Abnormal; Notable for the following:    RBC 4.14 (*)    Hemoglobin 10.5 (*)    HCT 33.2 (*)    MCH 25.4 (*)    RDW 18.0 (*)    All other components within normal limits  URINALYSIS, ROUTINE W REFLEX MICROSCOPIC - Abnormal; Notable for the following:    APPearance HAZY (*)    Hgb urine dipstick SMALL (*)    Bacteria, UA RARE (*)    Squamous Epithelial / LPF 0-5 (*)    All other components within  normal limits    EKG  EKG Interpretation None       Radiology No results found.  Procedures Procedures (including critical care time)  Medications Ordered in ED Medications - No data to display   Initial Impression / Assessment and Plan / ED Course  I have reviewed the triage vital signs and the nursing notes.  Pertinent labs & imaging results that were available during my care of the patient were reviewed by me and considered in my medical decision making (see chart for details).    Pt well appearing.  Vitals stable.  Care plan discussed with Dr. Roderic Palau.  Pt agrees to rx for [employer].  Return precautions discussed.  No concerning sx's for acute abdomen.   Final Clinical Impressions(s) / ED Diagnoses   Final diagnoses:  Slow transit constipation    New Prescriptions Discharge Medication List as of 08/07/2016  9:40 PM    START taking these medications   Details  polyethylene glycol (GOLYTELY) 236 g solution Mix as directed with water.  Drink 8 oz glass every 15-20 minutes until stools are clear, Print         Kem Parkinson, PA-C 08/11/16 2347    Milton Ferguson, MD 08/12/16 1556

## 2016-08-11 NOTE — Telephone Encounter (Signed)
Pt having 2 molars and pre molar to be extracted due to infection. He is going to call oral surgeon and have them fax their form to [place].

## 2016-08-12 ENCOUNTER — Encounter (HOSPITAL_COMMUNITY): Payer: Self-pay | Admitting: *Deleted

## 2016-08-12 NOTE — Progress Notes (Addendum)
Pt denies SOB and chest pain. Pt under the care of Dr. Domenic Polite, Cardiology. Pt stated that a stress test was performed 3-4 years ago in [place], [place]. Pt made aware to stop taking vitamins, fish oil and herbal medications. Do not take any NSAIDs ie: Ibuprofen, Advil, Naproxen BC and Goody Powder. Pt verbalized understanding of all pre-op instructions. Anesthesia made aware of cardiac clearance note in Epic.

## 2016-08-12 NOTE — Progress Notes (Signed)
Anesthesia Chart Review: SAME DAY WORK-UP.  Patient is a 71 year old male scheduled for multiple teeth extractions with alveoloplasty on 08/13/16 by Dr. Maurilio Lovely. Procedure was initially scheduled for 07/29/16 with Dr. Enrique Sack, but reported chest pain so surgery was postponed until he could be re-evaluated by cardiology. He had known severe AI with hospitalization for endocarditis 06/2016. He has since seen Dr. Domenic Polite and had a cardiac cath revealing severe RCA stenosis. It is anticipated that he will ultimately require AVR and RCA grafting. In regarding to proceeding with this surgery, Dr. Domenic Polite wrote, "He had a recent cardiac catheterization showing single vessel obstructive disease affecting the RCA. This will need to be addressed with bypass of the time of AVR. 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 Last BMI 30.92. He denied ETOH, IV drug use. He was seen by cardiologist Dr. Percival Spanish and CT surgeon Dr. Roxy Manns and they felt there was no urgent need for AVR during his 06/2016 hospitalization.  - PCP is Dr. Emi Belfast. - Cardiologist is Dr. Rozann Lesches at [facility]).  - ID is Dr. Michel Bickers, last visit 07/28/16. Patient was noted to be improving on therapy for viridans strep aortic valve endocarditis. He was to complete antibiotic therapy on 07/29/16 and then follow-up with ID in 6 weeks.  - CT surgeon is Dr. Darylene Price, next visit scheduled for 08/17/16.  Meds include ASA 81 mg, ferrous  sulfate, Lasix, MVI.   EKG 07/13/16: SR with first degree AV block, LAD, LVH with QRS widening.  Cardiac cath 08/03/16:   Ost RCA lesion, 70 %stenosed.  Prox Cx to Mid Cx lesion, 30 %stenosed.  Mid LAD lesion, 20 %stenosed.  Ost 2nd Diag lesion, 20 %stenosed.  Dist LAD lesion, 40 %stenosed.  LV end diastolic pressure is mildly elevated. 1. Severe stenosis ostium of the large dominant RCA. There is dampening of the pressure with catheter engagement of this vessel suggesting the stenosis is severe.  2. Mild disease in the Circumflex and LAD 3. Mildly elevated LV filling pressure 4. Dilated ascending aorta.  Recommendation: He will continue planning for AVR. He will need AVR and bypass of the RCA with special attention paid to his ascending aorta. He has follow up in 2 weeks with Dr. Roxy Manns.   TEE 07/07/16: Study Conclusions - Left ventricle: The cavity size was moderately dilated. Wall thickness was normal. Systolic function was normal. The estimated ejection fraction was in the range of 60% to 65%. Wall motion was normal; there were no regional wall motion abnormalities. - Aortic valve: The aortic valve was bicuspid. There was eccentric aortic insufficiency that appeared severe. There was no aortic stenosis. No definite vegetation seen. There was a small area of thickening with possible potential space at the valve base adjacent to the RV. The AI appeared to originate from this area. However, I am not convinced this is a vegetation or abscess. - Aorta: The aortic root was dilated to to 4.7 cm with a 4.4 cm ascending aorta. Diastolic flow reversal noted in the ascending thoracic aorta. - Mitral valve: No evidence of  vegetation. There was mild regurgitation. - Left atrium: No evidence of thrombus in the atrial cavity or appendage. - Right ventricle: The cavity size was normal. Systolic function was normal. - Right atrium: No evidence of thrombus in the  atrial cavity or appendage. - Atrial septum: No defect or patent foramen ovale was identified. - Tricuspid valve: No evidence of vegetation. - Pulmonic valve: No evidence of vegetation. Impressions: - 1. Moderately dilated LV with normal systolic function. 2. Bicuspid aortic valve with severe eccentric aortic insufficiency. No definite infection seen. 3. Moderately dilated aortic root and ascending aorta.  DG ABD acut w/chest 08/07/16: FINDINGS: - No pneumothorax. The heart is unchanged. A tortuous aorta with ascending aneurysmal dilatation is again identified and unchanged. No nodules or masses. No focal infiltrates. - No free air, portal venous gas, or pneumatosis. No evidence of bowel obstruction. No other acute abnormalities. IMPRESSION: 1. Known ascending thoracic aortic aneurysm, unchanged on today's study compared to the July 05, 2016 chest x-ray. 2. No bowel obstruction or acute abdominal abnormality.  Labs from 08/07/16 noted. Cr 1.17. Glucose 110. H/H 10.5/33.2.   He has known 1V CAD with severe AI and recent treatment for endocarditis. It is anticipated that he will eventually need AVR/CABG. Dental surgery is felt indicated prior to undergoing cardiac surgery. Cardiology felt he was "intermediate" risk. If no acute changes then I anticipate that he can proceed as planned.   George Hugh [facility] Short Stay Center/Anesthesiology Phone [phone] 08/12/2016 1:39 PM

## 2016-08-13 ENCOUNTER — Ambulatory Visit (HOSPITAL_COMMUNITY): Payer: Medicare Other | Admitting: Vascular Surgery

## 2016-08-13 ENCOUNTER — Encounter (HOSPITAL_COMMUNITY): Payer: Self-pay | Admitting: Urology

## 2016-08-13 ENCOUNTER — Ambulatory Visit (HOSPITAL_COMMUNITY)
Admission: RE | Admit: 2016-08-13 | Discharge: 2016-08-13 | Disposition: A | Discharge status: Home / Self Care | Payer: Medicare Other | Source: Ambulatory Visit | Attending: Dentistry | Admitting: Dentistry

## 2016-08-13 ENCOUNTER — Encounter (HOSPITAL_COMMUNITY)
Admission: RE | Disposition: A | Discharge status: Home / Self Care | Payer: Self-pay | Source: Ambulatory Visit | Attending: Dentistry

## 2016-08-13 DIAGNOSIS — K0530 Chronic periodontitis, unspecified: Secondary | ICD-10-CM | POA: Diagnosis not present

## 2016-08-13 DIAGNOSIS — I330 Acute and subacute infective endocarditis: Secondary | ICD-10-CM | POA: Insufficient documentation

## 2016-08-13 DIAGNOSIS — Z87891 Personal history of nicotine dependence: Secondary | ICD-10-CM | POA: Diagnosis not present

## 2016-08-13 DIAGNOSIS — Z7982 Long term (current) use of aspirin: Secondary | ICD-10-CM | POA: Diagnosis not present

## 2016-08-13 DIAGNOSIS — K045 Chronic apical periodontitis: Secondary | ICD-10-CM

## 2016-08-13 DIAGNOSIS — I2510 Atherosclerotic heart disease of native coronary artery without angina pectoris: Secondary | ICD-10-CM | POA: Insufficient documentation

## 2016-08-13 DIAGNOSIS — B955 Unspecified streptococcus as the cause of diseases classified elsewhere: Secondary | ICD-10-CM | POA: Diagnosis not present

## 2016-08-13 DIAGNOSIS — Z79899 Other long term (current) drug therapy: Secondary | ICD-10-CM | POA: Diagnosis not present

## 2016-08-13 DIAGNOSIS — D649 Anemia, unspecified: Secondary | ICD-10-CM | POA: Insufficient documentation

## 2016-08-13 DIAGNOSIS — I38 Endocarditis, valve unspecified: Secondary | ICD-10-CM | POA: Diagnosis not present

## 2016-08-13 DIAGNOSIS — I712 Thoracic aortic aneurysm, without rupture: Secondary | ICD-10-CM | POA: Insufficient documentation

## 2016-08-13 DIAGNOSIS — I25110 Atherosclerotic heart disease of native coronary artery with unstable angina pectoris: Secondary | ICD-10-CM | POA: Diagnosis not present

## 2016-08-13 DIAGNOSIS — Q231 Congenital insufficiency of aortic valve: Secondary | ICD-10-CM | POA: Insufficient documentation

## 2016-08-13 DIAGNOSIS — I351 Nonrheumatic aortic (valve) insufficiency: Secondary | ICD-10-CM

## 2016-08-13 HISTORY — PX: MULTIPLE EXTRACTIONS WITH ALVEOLOPLASTY: SHX5342

## 2016-08-13 HISTORY — DX: Chronic periodontitis, unspecified: K05.30

## 2016-08-13 HISTORY — DX: Pneumonia, unspecified organism: J18.9

## 2016-08-13 HISTORY — DX: Atherosclerotic heart disease of native coronary artery without angina pectoris: I25.10

## 2016-08-13 SURGERY — MULTIPLE EXTRACTION WITH ALVEOLOPLASTY
Anesthesia: General

## 2016-08-13 MED ORDER — DEXAMETHASONE SODIUM PHOSPHATE 10 MG/ML IJ SOLN
INTRAMUSCULAR | Status: AC
Start: 2016-08-13 — End: ?
  Filled 2016-08-13: qty 1

## 2016-08-13 MED ORDER — MIDAZOLAM HCL 2 MG/2ML IJ SOLN
INTRAMUSCULAR | Status: AC
Start: 2016-08-13 — End: ?
  Filled 2016-08-13: qty 2

## 2016-08-13 MED ORDER — CHLORHEXIDINE GLUCONATE 0.12 % MT SOLN: OROMUCOSAL | 99 refills | Status: DC

## 2016-08-13 MED ORDER — OXYCODONE-ACETAMINOPHEN 5-325 MG PO TABS
1.0000 | ORAL_TABLET | ORAL | Status: DC | PRN
  Administered 2016-08-13: 2 via ORAL

## 2016-08-13 MED ORDER — PHENYLEPHRINE HCL 10 MG/ML IJ SOLN
INTRAMUSCULAR | Status: DC | PRN
  Administered 2016-08-13: 80 ug via INTRAVENOUS
  Administered 2016-08-13: 40 ug via INTRAVENOUS
  Administered 2016-08-13 (×3): 80 ug via INTRAVENOUS

## 2016-08-13 MED ORDER — LACTATED RINGERS IV SOLN: INTRAVENOUS | Status: DC

## 2016-08-13 MED ORDER — OXYCODONE-ACETAMINOPHEN 5-325 MG PO TABS: ORAL_TABLET | ORAL | 0 refills | Status: DC

## 2016-08-13 MED ORDER — LIDOCAINE-EPINEPHRINE 2 %-1:100000 IJ SOLN
INTRAMUSCULAR | Status: DC | PRN
  Administered 2016-08-13: 1.7 mL via INTRADERMAL

## 2016-08-13 MED ORDER — PROPOFOL 10 MG/ML IV BOLUS
INTRAVENOUS | Status: AC
  Filled 2016-08-13: qty 40

## 2016-08-13 MED ORDER — ONDANSETRON HCL 4 MG/2ML IJ SOLN
INTRAMUSCULAR | Status: DC | PRN
  Administered 2016-08-13: 4 mg via INTRAVENOUS

## 2016-08-13 MED ORDER — FENTANYL CITRATE (PF) 250 MCG/5ML IJ SOLN
INTRAMUSCULAR | Status: AC
  Filled 2016-08-13: qty 5

## 2016-08-13 MED ORDER — BUPIVACAINE-EPINEPHRINE 0.5% -1:200000 IJ SOLN
INTRAMUSCULAR | Status: DC | PRN
  Administered 2016-08-13: 10 mL

## 2016-08-13 MED ORDER — LACTATED RINGERS IV SOLN
INTRAVENOUS | Status: DC | PRN
  Administered 2016-08-13: 07:00:00 via INTRAVENOUS

## 2016-08-13 MED ORDER — LIDOCAINE HCL (CARDIAC) 20 MG/ML IV SOLN
INTRAVENOUS | Status: DC | PRN
  Administered 2016-08-13: 100 mg via INTRAVENOUS

## 2016-08-13 MED ORDER — ROCURONIUM BROMIDE 10 MG/ML (PF) SYRINGE
PREFILLED_SYRINGE | INTRAVENOUS | Status: AC
  Filled 2016-08-13: qty 5

## 2016-08-13 MED ORDER — SUGAMMADEX SODIUM 200 MG/2ML IV SOLN
INTRAVENOUS | Status: AC
  Filled 2016-08-13: qty 2

## 2016-08-13 MED ORDER — CEFAZOLIN SODIUM-DEXTROSE 2-4 GM/100ML-% IV SOLN
2.0000 g | Freq: Once | INTRAVENOUS | Status: AC
  Administered 2016-08-13: 2 g via INTRAVENOUS
  Filled 2016-08-13: qty 100

## 2016-08-13 MED ORDER — OXYMETAZOLINE HCL 0.05 % NA SOLN
NASAL | Status: DC | PRN
  Administered 2016-08-13: 2 via NASAL

## 2016-08-13 MED ORDER — EPHEDRINE 5 MG/ML INJ
INTRAVENOUS | Status: AC
  Filled 2016-08-13: qty 10

## 2016-08-13 MED ORDER — PROPOFOL 10 MG/ML IV BOLUS
INTRAVENOUS | Status: DC | PRN
  Administered 2016-08-13: 150 mg via INTRAVENOUS

## 2016-08-13 MED ORDER — LIDOCAINE 2% (20 MG/ML) 5 ML SYRINGE
INTRAMUSCULAR | Status: AC
  Filled 2016-08-13: qty 5

## 2016-08-13 MED ORDER — MIDAZOLAM HCL 5 MG/5ML IJ SOLN
INTRAMUSCULAR | Status: DC | PRN
  Administered 2016-08-13 (×2): 1 mg via INTRAVENOUS

## 2016-08-13 MED ORDER — BUPIVACAINE-EPINEPHRINE (PF) 0.5% -1:200000 IJ SOLN
INTRAMUSCULAR | Status: AC
Start: 2016-08-13 — End: ?
  Filled 2016-08-13: qty 3.6

## 2016-08-13 MED ORDER — ROCURONIUM BROMIDE 100 MG/10ML IV SOLN
INTRAVENOUS | Status: DC | PRN
  Administered 2016-08-13: 50 mg via INTRAVENOUS
  Administered 2016-08-13: 10 mg via INTRAVENOUS

## 2016-08-13 MED ORDER — HEMOSTATIC AGENTS (NO CHARGE) OPTIME
TOPICAL | Status: DC | PRN
  Administered 2016-08-13: 1 via TOPICAL

## 2016-08-13 MED ORDER — EPHEDRINE SULFATE 50 MG/ML IJ SOLN
INTRAMUSCULAR | Status: DC | PRN
  Administered 2016-08-13 (×2): 5 mg via INTRAVENOUS
  Administered 2016-08-13: 10 mg via INTRAVENOUS

## 2016-08-13 MED ORDER — FENTANYL CITRATE (PF) 100 MCG/2ML IJ SOLN
INTRAMUSCULAR | Status: DC | PRN
  Administered 2016-08-13: 100 ug via INTRAVENOUS

## 2016-08-13 MED ORDER — OXYCODONE-ACETAMINOPHEN 5-325 MG PO TABS
ORAL_TABLET | ORAL | Status: AC
  Filled 2016-08-13: qty 2

## 2016-08-13 MED ORDER — 0.9 % SODIUM CHLORIDE (POUR BTL) OPTIME
TOPICAL | Status: DC | PRN
  Administered 2016-08-13: 1000 mL

## 2016-08-13 MED ORDER — DEXAMETHASONE SODIUM PHOSPHATE 10 MG/ML IJ SOLN
INTRAMUSCULAR | Status: DC | PRN
  Administered 2016-08-13: 10 mg via INTRAVENOUS

## 2016-08-13 MED ORDER — ONDANSETRON HCL 4 MG/2ML IJ SOLN
INTRAMUSCULAR | Status: AC
Start: 2016-08-13 — End: ?
  Filled 2016-08-13: qty 2

## 2016-08-13 MED ORDER — SUGAMMADEX SODIUM 200 MG/2ML IV SOLN
INTRAVENOUS | Status: DC | PRN
  Administered 2016-08-13: 200 mg via INTRAVENOUS

## 2016-08-13 MED ORDER — LIDOCAINE-EPINEPHRINE 2 %-1:100000 IJ SOLN
INTRAMUSCULAR | Status: AC
  Filled 2016-08-13: qty 10.2

## 2016-08-13 MED ORDER — PHENYLEPHRINE 40 MCG/ML (10ML) SYRINGE FOR IV PUSH (FOR BLOOD PRESSURE SUPPORT)
PREFILLED_SYRINGE | INTRAVENOUS | Status: AC
Start: 2016-08-13 — End: ?
  Filled 2016-08-13: qty 10

## 2016-08-13 SURGICAL SUPPLY — 36 items
ALCOHOL 70% 16 OZ (MISCELLANEOUS) ×2 IMPLANT
ATTRACTOMAT 16X20 MAGNETIC DRP (DRAPES) ×2 IMPLANT
BLADE SURG 15 STRL LF DISP TIS (BLADE) ×2 IMPLANT
BLADE SURG 15 STRL SS (BLADE) ×2
COVER SURGICAL LIGHT HANDLE (MISCELLANEOUS) ×2 IMPLANT
GAUZE PACKING FOLDED 2  STR (GAUZE/BANDAGES/DRESSINGS) ×1
GAUZE PACKING FOLDED 2 STR (GAUZE/BANDAGES/DRESSINGS) ×1 IMPLANT
GAUZE SPONGE 4X4 12PLY STRL LF (GAUZE/BANDAGES/DRESSINGS) ×2 IMPLANT
GAUZE SPONGE 4X4 16PLY XRAY LF (GAUZE/BANDAGES/DRESSINGS) ×2 IMPLANT
GLOVE BIOGEL PI IND STRL 6 (GLOVE) ×1 IMPLANT
GLOVE BIOGEL PI INDICATOR 6 (GLOVE) ×1
GLOVE SURG ORTHO 8.0 STRL STRW (GLOVE) ×2 IMPLANT
GLOVE SURG SS PI 6.0 STRL IVOR (GLOVE) ×2 IMPLANT
GOWN STRL REUS W/ TWL LRG LVL3 (GOWN DISPOSABLE) ×1 IMPLANT
GOWN STRL REUS W/TWL 2XL LVL3 (GOWN DISPOSABLE) ×2 IMPLANT
GOWN STRL REUS W/TWL LRG LVL3 (GOWN DISPOSABLE) ×1
HEMOSTAT SURGICEL 2X14 (HEMOSTASIS) ×2 IMPLANT
KIT BASIN OR (CUSTOM PROCEDURE TRAY) ×2 IMPLANT
KIT ROOM TURNOVER OR (KITS) ×2 IMPLANT
MANIFOLD NEPTUNE WASTE (CANNULA) ×2 IMPLANT
NEEDLE BLUNT 16X1.5 OR ONLY (NEEDLE) ×2 IMPLANT
NS IRRIG 1000ML POUR BTL (IV SOLUTION) ×2 IMPLANT
PACK EENT II TURBAN DRAPE (CUSTOM PROCEDURE TRAY) ×2 IMPLANT
PAD ARMBOARD 7.5X6 YLW CONV (MISCELLANEOUS) ×2 IMPLANT
SPONGE SURGIFOAM ABS GEL 100 (HEMOSTASIS) IMPLANT
SPONGE SURGIFOAM ABS GEL 12-7 (HEMOSTASIS) IMPLANT
SPONGE SURGIFOAM ABS GEL SZ50 (HEMOSTASIS) ×2 IMPLANT
SUCTION FRAZIER HANDLE 10FR (MISCELLANEOUS)
SUCTION TUBE FRAZIER 10FR DISP (MISCELLANEOUS) IMPLANT
SUT CHROMIC 3 0 PS 2 (SUTURE) ×4 IMPLANT
SUT CHROMIC 4 0 P 3 18 (SUTURE) ×2 IMPLANT
SYR 50ML SLIP (SYRINGE) ×2 IMPLANT
TOWEL OR 17X26 10 PK STRL BLUE (TOWEL DISPOSABLE) ×2 IMPLANT
TUBE CONNECTING 12X1/4 (SUCTIONS) ×2 IMPLANT
WATER TABLETS ICX (MISCELLANEOUS) ×2 IMPLANT
YANKAUER SUCT BULB TIP NO VENT (SUCTIONS) ×2 IMPLANT

## 2016-08-13 NOTE — Anesthesia Postprocedure Evaluation (Signed)
Anesthesia Post Note  Patient: Charles [name]  Procedure(s) Performed: Procedure(s) (LRB): Extraction of tooth #'s 2,4,14 with alveoloplasty (N/A)  Patient location during evaluation: PACU Anesthesia Type: General Level of consciousness: sedated Pain management: pain level controlled Vital Signs Assessment: post-procedure vital signs reviewed and stable Respiratory status: spontaneous breathing and respiratory function stable Cardiovascular status: stable Anesthetic complications: no       Last Vitals:  Vitals:   08/13/16 0930 08/13/16 0945  BP: (!) 115/51 (!) 131/53  Pulse: 71 69  Resp: 16 18  Temp:  36.6 C    Last Pain:  Vitals:   08/13/16 0945  TempSrc:   PainSc: 3                  Money Mckeithan DANIEL

## 2016-08-13 NOTE — Transfer of Care (Signed)
Immediate Anesthesia Transfer of Care Note  Patient: Charles [name]  Procedure(s) Performed: Procedure(s): Extraction of tooth #'s 2,4,14 with alveoloplasty (N/A)  Patient Location: PACU  Anesthesia Type:General  Level of Consciousness: drowsy  Airway & Oxygen Therapy: Patient Spontanous Breathing and Patient connected to nasal cannula oxygen  Post-op Assessment: Report given to RN and Post -op Vital signs reviewed and stable  Post vital signs: Reviewed and stable  Last Vitals:  Vitals:   08/13/16 0621  BP: (!) 146/61  Pulse: 64  Resp: 20  Temp: 37 C    Last Pain:  Vitals:   08/13/16 0621  TempSrc: Oral  PainSc: 2       Patients Stated Pain Goal: 3 (03/47/42 5956)  Complications: No apparent anesthesia complications

## 2016-08-13 NOTE — Anesthesia Preprocedure Evaluation (Addendum)
Anesthesia Evaluation  Patient identified by MRN, date of birth, ID band Patient awake    Reviewed: Allergy & Precautions, NPO status , Patient's Chart, lab work & pertinent test results  Airway Mallampati: I  TM Distance: >3 FB Neck ROM: Full    Dental   Pulmonary former smoker,    Pulmonary exam normal        Cardiovascular + CAD  Normal cardiovascular exam     Neuro/Psych    GI/Hepatic   Endo/Other    Renal/GU      Musculoskeletal   Abdominal   Peds  Hematology   Anesthesia Other Findings   Reproductive/Obstetrics                             Anesthesia Physical Anesthesia Plan  ASA: III  Anesthesia Plan: General   Post-op Pain Management:    Induction: Intravenous  Airway Management Planned: Nasal ETT  Additional Equipment:   Intra-op Plan: Utilization Of Total Body Hypothermia per surgeon request  Post-operative Plan: Extubation in OR  Informed Consent: I have reviewed the patients History and Physical, chart, labs and discussed the procedure including the risks, benefits and alternatives for the proposed anesthesia with the patient or authorized representative who has indicated his/her understanding and acceptance.     Plan Discussed with: CRNA and Surgeon  Anesthesia Plan Comments:        Anesthesia Quick Evaluation

## 2016-08-13 NOTE — Op Note (Signed)
OPERATIVE REPORT  Patient:            Charles [name] Date of Birth:  May 01, 1945 MRN:                [id number]   DATE OF PROCEDURE:  08/13/2016  PREOPERATIVE DIAGNOSES: 1. History of streptococcal endocarditis 2. Aortic insufficiency 3. Pre-heart valve surgery dental protocol 4. Chronic apical periodontitis 5. Chronic periodontitis   POSTOPERATIVE DIAGNOSES: 1. History of streptococcal endocarditis 2. Aortic insufficiency 3. Pre-heart valve surgery dental protocol 4. Chronic apical periodontitis 5. Chronic periodontitis   OPERATIONS: 1. Multiple extraction of tooth numbers 2,4,14  2. 2 Quadrants of alveoloplasty   SURGEON: Lenn Cal, DDS  ASSISTANT: Camie Patience, (dental assistant)  ANESTHESIA: General anesthesia via nasoendotracheal tube.  MEDICATIONS: 1. Ancef 2 g IV prior to invasive dental procedures. 2. Local anesthesia with a total utilization of 4 carpules each containing 34 mg of lidocaine with 0.017 mg of epinephrine.  SPECIMENS: There are 3 teeth that were discarded.  DRAINS: None  CULTURES: None  COMPLICATIONS: None   ESTIMATED BLOOD LOSS: Less than 50 mLs.  INTRAVENOUS FLUIDS: 400 mLs of Lactated ringers solution.  INDICATIONS: The patient was previously diagnosis of streptococcal endocarditis. A medically necessary dental consultation was then requested to evaluate poor dentition for source of endocarditis as well as part of a pre-heart valve surgery dental protocol.  The patient was examined and treatment planned for extraction of tooth numbers 2, 4, and 14.  This treatment plan was formulated to decrease the risks and complications associated with dental infection from further affecting the patient's systemic health and anticipated heart valve surgery.  OPERATIVE FINDINGS: Patient was examined operating room number 10.  The teeth were identified for extraction. The patient was noted be affected by chronic periodontitis and chronic apical  periodontitis.   DESCRIPTION OF PROCEDURE: Patient was brought to the main operating room number 10. Patient was then placed in the supine position on the operating table. General anesthesia was then induced per the anesthesia team. The patient was then prepped and draped in the usual manner for dental medicine procedure. A timeout was performed. The patient was identified and procedures were verified. A throat pack was placed at this time. The oral cavity was then thoroughly examined with the findings noted above. The patient was then ready for dental medicine procedure as follows:  Local anesthesia was then administered sequentially with a total utilization of 4 carpules each containing 34 mg of lidocaine with 0.017 mg of epinephrine.  The Maxillary left and right quadrants first approached. Anesthesia was then delivered utilizing infiltration with lidocaine with epinephrine. A #15 blade incision was then made from the distal of #15 extended to the distal of #12.  A  surgical flap was then carefully reflected. Appropriate amounts of buccal and interseptal bone were then removed utilizing a surgical handpiece and bur and copious amounts of sterile water around tooth #14.  Tooth #14 was then subluxated with a series of straight elevators. The coronal aspect of tooth #14 as well as the buccal roots were then removed with a 53L forceps leaving the palatal root remaining. Further bone was then removed around the palatal root utilizing a surgical handpiece and bur and copious amounts sterile water. The palatal root was then elevated out with a cryers elevator. Alveoloplasty was then performed utilizing a rongeur and bone file to help achieve primary closure. The surgical site was then irrigated with copious amounts of sterile saline. A piece  of Surgifoam was placed in the extraction sockets appropriately. The surgical site was then closed from the mesial of #15 extended to the distal lumbar 12 utilizing 3-0  chromic gut suture in a continuous interrupted suture technique 1.  At this point time the maxillary right quadrant was approached. A 15 blade incision was made from the distal of #1 and extended to the mesial of #6. A surgical flap was then carefully reflected. Tooth numbers 2 and 4 were then subluxated with a series of straight elevators. The coronal aspect of tooth #4 was then removed with a 150 forceps leaving the roots remaining. Further bone was then removed around the retained roots with a surgical handpiece and bur and copious amounts sterile water. Retained roots were then elevated out with a root tip pick. Excessive granulation tissue was then removed with a rongeurs. The coronal aspect of tooth #2 was then removed with a 53R forceps leaving the roots remaining. Further bone was then removed around retained roots with a surgical handpiece and bur and copious amounts sterile water. The roots were then elevated out with a series of cryers elevators. Alveoloplasty was performed utilizing a rongeurs and bone file to help achieve primary closure in the area of tooth numbers 2 and 4 appropriately.  The surgical sites were then irrigated with copious amounts of sterile saline. A piece of Surgifoam was placed in the extraction sockets appropriately. The surgical site area #2 was then closed from the mesial of #1 and extended to the distal of #3 utilizing 3-0 chromic gut suture in a continuous interrupted suture technique 1. The surgical site was then further closed from the mesial #3 and extended to the distal of number 5 using 3-0 chromic gut suture in a continuous interrupted suture technique 1. An interrupted suture was then placed interproximally between tooth numbers 5 and 6 to further close the  surgical site.  At this point time, the entire mouth was irrigated with copious amounts of sterile saline. The patient was examined for complications, seeing none, the dental medicine procedure was deemed to  be complete. The throat pack was removed at this time. An oral airway was then placed at the request of the anesthesia team. A series of 4 x 4 gauze were placed in the mouth to aid hemostasis. The patient was then handed over to the anesthesia team for final disposition. After an appropriate amount of time, the patient was extubated and taken to the postanesthsia care unit in good condition. All counts were correct for the dental medicine procedure. Patient was given a prescription for Percocet 5/325. Patient is to take 1-2 tablets by mouth every 6 hours as needed for pain. Patient also is to start using chlorhexidine rinses on Friday morning in a swish and spit manner.   Lenn Cal, DDS.

## 2016-08-13 NOTE — Progress Notes (Signed)
PRE-OPERATIVE NOTE:  08/13/2016 Charles [name] [id number]  VITALS: BP (!) 146/61   Pulse 64   Temp 98.6 F (37 C) (Oral)   Resp 20   SpO2 98%   Lab Results  Component Value Date   WBC 9.8 08/07/2016   HGB 10.5 (L) 08/07/2016   HCT 33.2 (L) 08/07/2016   MCV 80.2 08/07/2016   PLT 268 08/07/2016   BMET    Component Value Date/Time   NA 137 08/07/2016 2019   K 3.7 08/07/2016 2019   CL 102 08/07/2016 2019   CO2 26 08/07/2016 2019   GLUCOSE 110 (H) 08/07/2016 2019   BUN 22 (H) 08/07/2016 2019   CREATININE 1.17 08/07/2016 2019   CALCIUM 8.9 08/07/2016 2019   GFRNONAA >60 08/07/2016 2019   GFRAA >60 08/07/2016 2019    Lab Results  Component Value Date   INR 1.06 07/29/2016   No results found for: PTT   [name] presents for multiple dental extractions with alveoloplasty in the OR with general anesthesia.  SUBJECTIVE: The patient denies any acute medical or dental changes and agrees to proceed with treatment as planned.  EXAM: No sign of acute dental changes.  ASSESSMENT: Patient is affected by chronic apical periodontitis, chronic periodontitis, and history of endocarditis.  PLAN: Patient agrees to proceed with treatment as planned in the operating room as previously discussed and accepts the risks, benefits, and complications of the proposed treatment. Patient is aware of the risk for bleeding, bruising, swelling, infection, pain, nerve damage, soft tissue damage, damage to adjacent teeth, sinus involvement, root tip fracture, and the risks of complications associated with the anesthesia. Patient also is aware of the potential for other complications not mentioned above.   Lenn Cal, DDS

## 2016-08-13 NOTE — Anesthesia Procedure Notes (Signed)
Procedure Name: Intubation Date/Time: 08/13/2016 7:38 AM Performed by: Candis Shine Pre-anesthesia Checklist: Patient identified, Emergency Drugs available, Suction available and Patient being monitored Patient Re-evaluated:Patient Re-evaluated prior to inductionOxygen Delivery Method: Circle System Utilized Preoxygenation: Pre-oxygenation with 100% oxygen Intubation Type: IV induction Ventilation: Mask ventilation without difficulty and Oral airway inserted - appropriate to patient size Laryngoscope Size: Sabra Heck and 3 Grade View: Grade II Nasal Tubes: Nasal Rae, Nasal prep performed, Magill forceps- large, utilized and Right Tube size: 7.5 mm Number of attempts: 1 Airway Equipment and Method: Oral airway Placement Confirmation: ETT inserted through vocal cords under direct vision,  positive ETCO2 and breath sounds checked- equal and bilateral Secured at: 22 cm Tube secured with: Tape Dental Injury: Teeth and Oropharynx as per pre-operative assessment  Comments: Intubation by Brooke Bonito, SRNA

## 2016-08-13 NOTE — Discharge Instructions (Signed)

## 2016-08-13 NOTE — H&P (Signed)
08/13/2016  Patient:            Charles [name] Date of Birth:  02-Jan-1946 MRN:                [id number]   BP (!) 146/61   Pulse 64   Temp 98.6 F (37 C) (Oral)   Resp 20   SpO2 98%   Charles [name] presents for multiple extractions with alveoloplasty in the operating room with general anesthesia. Patient denies any recent medical changes or dental changes. Patient recently evaluated by Dr. Conni Elliot and also had a recent cardiac catheterization revealing coronary artery disease of the RCA. Patient with anticipated aortic valve replacement as well as RCA intervention. Please see admission note from Dr. Maxie Barb H&P for the dental operating room procedure dated 07/29/2016.  Charles [name], DDS  Cardiology Office Note  Date: 07/29/2016   ID: Charles [name], DOB 01-21-1946, MRN [id number]  PCP: Wende Neighbors, MD     Evaluating Cardiologist: Rozann Lesches, MD      Chief Complaint  Patient presents with  . Aortic valve disease  . Recent endocarditis  . Chest Pain    History of Present Illness: Charles [name] is a medically complex 71 y.o. male presenting for a follow-up office visit and for preoperative clearance. This is my first meeting with him today. I reviewed extensive records and updated his chart. He was recently hospitalized in March with Viridans streptococcal bacteremia and endocarditis. He had no definitive valvular vegetation by TEE but at baseline has a bicuspid aortic valve and had severe aortic regurgitation. He was seen by ID service with plan for 4 weeks IV antibiotics. 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 His note from March 28 indicated concern that there may have been some component of leaflet perforation. Patient was to undergo dental work and also cardiac catheterization. He was seen in the office by Ms. Lawrence NP following discharge on April 12.  My understanding in  reviewing the chart and speaking with him today is that he was to undergo dental work earlier this morning, however the procedure was canceled by Dr. Enrique Sack given concerns about reported chest pain. Patient states that over the last 3-4 weeks he has had intermittent episodes of chest pain, lasting anywhere from seconds to a few minutes, no precipitant, generally sharp in quality, and not specifically relieved by anything in particular. He has had no palpitations during this time. He is currently wearing a heart monitor to exclude any specific arrhythmias or progressive conduction system disease. Patient followed up with Dr. Megan Salon yesterday in the ID clinic, has just completed 4 weeks of IV antibiotics and is to have his PICC line removed tomorrow. He does not report any fevers or chills. Reports steadily improving appetite.  Today we had a long discussion regarding his recent hospitalization and general plan moving toward anticipated AVR with Dr. Roxy Manns. We discussed proceeding with a diagnostic cardiac catheterization as a next step, now that he has completed IV antibiotics, for clear assessment of coronary anatomy particularly in light of his chest pain symptoms, although these are fairly atypical. We discussed the risks and benefits, and he is in agreement to proceed.      Past Medical History:  Diagnosis Date  . Anemia   . Aortic insufficiency    a. 07/07/2016: TEE showing bicuspid aortic valve with severe eccentric AI   .  Ascending aortic aneurysm (HCC)    4.4 cm by CT 06/2016  . 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 Bicuspid aortic valve          Past Surgical History:  Procedure Laterality Date  . HERNIA REPAIR Left   . TEE WITHOUT CARDIOVERSION N/A 07/07/2016   Procedure: TRANSESOPHAGEAL ECHOCARDIOGRAM (TEE);  Surgeon: Larey Dresser, MD;  Location: [facility] ENDOSCOPY;  Service: Cardiovascular;  Laterality: N/A;          Current Outpatient  Prescriptions  Medication Sig Dispense Refill  . acetaminophen (TYLENOL) 325 MG tablet Take 650 mg by mouth every 6 (six) hours as needed for headache.    Marland Kitchen aspirin EC 81 MG tablet Take 81 mg by mouth daily.    . cefTRIAXone (ROCEPHIN) IVPB Inject 2 g into the vein daily. Indication:  Endocarditis Last Day of Therapy:  08/06/2016 Labs - Once weekly:  CBC/D and BMP, Labs - Every other week:  ESR and CRP 27 Units 0  . ENSURE PLUS (ENSURE PLUS) LIQD Take 237 mLs by mouth daily.    . Ferrous Sulfate 27 MG TABS Take 27 mg by mouth daily.    . furosemide (LASIX) 40 MG tablet Take 1 tablet (40 mg total) by mouth daily. 30 tablet 2  . Multiple Vitamin (MULTIVITAMIN WITH MINERALS) TABS tablet Take 1 tablet by mouth daily.     No current facility-administered medications for this visit.    Allergies:  No known allergies   Social History: The patient  reports that he quit smoking about 51 years ago. His smoking use included Cigarettes. He has never used smokeless tobacco. He reports that he does not drink alcohol or use drugs.   Family History: The patient's family history includes Heart disease (age of onset: 53) in his father; Ovarian cancer in his mother.   ROS:  Please see the history of present illness. Otherwise, complete review of systems is positive for improving appetite and fatigue.  All other systems are reviewed and negative.   Physical Exam: VS:  BP (!) 128/58   Pulse 77   Ht 6' (1.829 m)   Wt 229 lb (103.9 kg)   SpO2 97%   BMI 31.06 kg/m , BMI Body mass index is 31.06 kg/m.     Wt Readings from Last 3 Encounters:  07/29/16 229 lb (103.9 kg)  07/28/16 231 lb (104.8 kg)  07/23/16 227 lb (103 kg)    General: Obese male, appears comfortable at rest. HEENT: Conjunctiva and lids normal, oropharynx clear. Neck: Supple, no elevated JVP or carotid bruits, no thyromegaly. Lungs: Clear to auscultation, nonlabored breathing at rest. Cardiac: Regular rate and rhythm,  no S3, 2/6 systolic murmur and 3/6 diastolic murmur, no pericardial rub. Abdomen: Soft, nontender, bowel sounds present, no guarding or rebound. Extremities: No pitting edema, distal pulses 2+. Skin: Warm and dry. Musculoskeletal: No kyphosis. Neuropsychiatric: Alert and oriented x3, affect grossly appropriate.  ECG: I personally reviewed the tracing from 07/13/2016 which showed sinus rhythm with prolonged PR interval, left anterior fascicular block, and increased voltage.  Recent Labwork: 07/01/2016: TSH 1.300 07/05/2016: ALT 17; AST 16 07/07/2016: Hemoglobin 8.3; Magnesium 2.0; Platelets 252 07/08/2016: BUN 14; Potassium 4.3; Sodium 138 07/09/2016: Creatinine, Ser 0.98  Labs (Brief)          Component Value Date/Time   CHOL 122 07/03/2016 0715   TRIG 125 07/03/2016 0715   HDL 16 (L) 07/03/2016 0715   CHOLHDL 7.6 07/03/2016 0715  VLDL 25 07/03/2016 0715   LDLCALC 81 07/03/2016 0715      Other Studies Reviewed Today:  Chest CTA 07/02/2016: IMPRESSION: 1. No acute intrathoracic pathology. 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 Recommend annual imaging followup by CTA or MRA. This recommendation follows 2010 ACCF/AHA/AATS/ACR/ASA/SCA/SCAI/SIR/STS/SVM Guidelines for the Diagnosis and Management of Patients with Thoracic Aortic Disease. Circulation. 2010; 121: U235-T614 2. Fatty liver. A small hypodense lesion in the caudate lobe is indeterminate but likely represents a cyst or hemangioma. 3. Probable right adrenal adenoma.  TEE 07/07/2016: Study Conclusions  - Left ventricle: The cavity size was moderately dilated. Wall thickness was normal. Systolic function was normal. The estimated ejection fraction was in the range of 60% to 65%. Wall motion was normal; there were no regional wall motion abnormalities. - Aortic valve: The aortic valve was bicuspid. There was eccentric aortic insufficiency that appeared severe. There was no  aortic stenosis. No definite vegetation seen. There was a small area of thickening with possible potential space at the valve base adjacent to the RV. The AI appeared to originate from this area. However, I am not convinced this is a vegetation or abscess. - Aorta: The aortic root was dilated to to 4.7 cm with a 4.4 cm ascending aorta. Diastolic flow reversal noted in the ascending thoracic aorta. - Mitral valve: No evidence of vegetation. There was mild regurgitation. - Left atrium: No evidence of thrombus in the atrial cavity or appendage. - Right ventricle: The cavity size was normal. Systolic function was normal. - Right atrium: No evidence of thrombus in the atrial cavity or appendage. - Atrial septum: No defect or patent foramen ovale was identified. - Tricuspid valve: No evidence of vegetation. - Pulmonic valve: No evidence of vegetation.  Impressions:  - 1. Moderately dilated LV with normal systolic function. 2. Bicuspid aortic valve with severe eccentric aortic insufficiency. No definite infection seen. 3. Moderately dilated aortic root and ascending aorta.  Assessment and Plan:  1. Recurring atypical chest pain as outlined above. Patient otherwise has been steadily improving in terms of weakness and improved appetite. He completed a four-week course of IV antibiotics today and will have his PICC line removed tomorrow. Dental work was canceled given concerns about his chest pain. After reviewing the situation and plan, we will move ahead with arranging a diagnostic cardiac catheterization for early next week to clearly evaluate coronary anatomy. Results need to be conveyed to Dr. Roxy Manns who will be seeing the patient within the next few weeks. If coronary anatomy is reassuring, dental work should be rescheduled as well.  2. Viridans streptococcal bacteremia and endocarditis. Patient had follow-up with Dr. Megan Salon yesterday he has completed 4 weeks  of IV antibiotics. PICC line to be removed tomorrow. No fevers or chills.  3. Bicuspid aortic valve with severe eccentric aortic regurgitation. No definitive valvular vegetation by TEE although endocarditis still suspected. Patient has follow-up with Dr. Roxy Manns in the next few weeks to discuss AVR.  4. Ascending aortic aneurysm measuring 4.4 cm by recent CTA.  5. Remote history of tobacco use. No obvious chronic lung disease.  Current medicines were reviewed with the patient today.      Orders Placed This Encounter  Procedures  . CBC w/Diff/Platelet  . Basic Metabolic Panel (BMET)  . INR/PT    Disposition: Follow-up after cardiac catheterization.  Signed, Satira Sark, MD, [facility] 07/29/2016 3:56 PM    [place] [facility] [address], [place], [place] [address] Phone: [phone]; Fax: [phone]

## 2016-08-14 ENCOUNTER — Encounter (HOSPITAL_COMMUNITY): Payer: Self-pay | Admitting: Dentistry

## 2016-08-17 ENCOUNTER — Ambulatory Visit (INDEPENDENT_AMBULATORY_CARE_PROVIDER_SITE_OTHER): Payer: Medicare Other | Admitting: Thoracic Surgery (Cardiothoracic Vascular Surgery)

## 2016-08-17 ENCOUNTER — Encounter: Payer: Self-pay | Admitting: Thoracic Surgery (Cardiothoracic Vascular Surgery)

## 2016-08-17 VITALS — BP 114/60 | HR 88 | Resp 20 | Ht 72.0 in | Wt 228.0 lb

## 2016-08-17 DIAGNOSIS — I351 Nonrheumatic aortic (valve) insufficiency: Secondary | ICD-10-CM | POA: Diagnosis not present

## 2016-08-17 DIAGNOSIS — Q231 Congenital insufficiency of aortic valve: Secondary | ICD-10-CM | POA: Diagnosis not present

## 2016-08-17 DIAGNOSIS — B955 Unspecified streptococcus as the cause of diseases classified elsewhere: Secondary | ICD-10-CM

## 2016-08-17 DIAGNOSIS — I712 Thoracic aortic aneurysm, without rupture: Secondary | ICD-10-CM | POA: Diagnosis not present

## 2016-08-17 DIAGNOSIS — I359 Nonrheumatic aortic valve disorder, unspecified: Secondary | ICD-10-CM

## 2016-08-17 DIAGNOSIS — I2510 Atherosclerotic heart disease of native coronary artery without angina pectoris: Secondary | ICD-10-CM

## 2016-08-17 DIAGNOSIS — Q2381 Bicuspid aortic valve: Secondary | ICD-10-CM

## 2016-08-17 DIAGNOSIS — I330 Acute and subacute infective endocarditis: Secondary | ICD-10-CM

## 2016-08-17 DIAGNOSIS — I7121 Aneurysm of the ascending aorta, without rupture: Secondary | ICD-10-CM

## 2016-08-17 NOTE — Progress Notes (Signed)
[address]       [place] [address]             [phone]     CARDIOTHORACIC SURGERY CONSULTATION REPORT  Referring Provider is Ghimire, Henreitta Leber, MD Primary Cardiologist is Satira Sark, MD PCP is [name], MD  Chief Complaint  Patient presents with  . 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  I had the opportunity to evaluate the patient in consultation during his hospitalization on 07/04/2016.  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 The jet of aortic insufficiency was quite eccentric and there was questions regarding the possibility of leaflet perforation. CT angiogram of the chest confirmed the presence of moderate aneurysmal enlargement of the aorta with maximum transverse diameter 4.4 cm.  The patient did not have symptoms or signs of congestive heart failure at the time and he responded well to medical therapy for endocarditis.  He completed his course of intravenous antibiotics as an outpatient and was seen in follow-up by Dr. Megan Salon from infectious disease and Dr. Domenic Polite in cardiology. He underwent diagnostic cardiac catheterization on 08/03/2016 revealing the presence of 70% ostial stenosis of the right coronary artery that was felt to be severe because of the degree of dampening pressure with catheter engagement. There was mild nonobstructive coronary artery disease involving the left anterior descending coronary artery and left circumflex coronary artery.  Left ventricular end-diastolic pressure was mildly elevated at 24  mmHg.  The patient recently underwent dental extraction by Dr. Enrique Sack and he returns to our office today for follow-up. He reports that he is doing exceptionally well. He states that he is essentially back to his baseline Odelia Gage although he notes that he is approximately 30 pounds lighter than he was 6 months ago. He denies any symptoms of exertional shortness of breath. He admits that he does not exercise on a regular basis, but he is back to normal activities without any noticeable shortness of breath. He can lay flat in bed without difficulty and he has no lower extremity edema. He has not had any chest pain or chest tightness. He denies any fevers, chills, or productive cough. The remainder of his review of systems is unchanged from previously.  Past Medical History:  Diagnosis Date  . Anemia   . Aortic insufficiency    a. 07/07/2016: TEE showing bicuspid aortic valve with severe eccentric AI   . Ascending aortic aneurysm (HCC)    4.4 cm by CT 06/2016  . 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 Bicuspid aortic valve   . Chronic periodontitis   . Coronary artery disease   . Pneumonia    " walking in 1978"    Past Surgical History:  Procedure Laterality Date  . HERNIA REPAIR Left   . MULTIPLE EXTRACTIONS WITH ALVEOLOPLASTY N/A 08/13/2016   Procedure: Extraction of tooth #'s 2,4,14 with alveoloplasty;  Surgeon: Lenn Cal, DDS;  Location: [facility];  Service: Oral Surgery;  Laterality: N/A;  . MULTIPLE TOOTH EXTRACTIONS    . RIGHT/LEFT HEART CATH AND CORONARY ANGIOGRAPHY N/A 08/03/2016   Procedure: Right/Left  Heart Cath and Coronary Angiography;  Surgeon: Burnell Blanks, MD;  Location: [facility];  Service: Cardiovascular;  Laterality: N/A;  . TEE WITHOUT CARDIOVERSION N/A 07/07/2016   Procedure: TRANSESOPHAGEAL ECHOCARDIOGRAM (TEE);  Surgeon: Larey Dresser, MD;  Location: [facility] ENDOSCOPY;  Service: Cardiovascular;  Laterality: N/A;    Family History   Problem Relation Age of Onset  . Ovarian cancer Mother   . Heart disease Father 85    Died of coronary thrombosis.      Social History   Social History  . Marital status: Married    Spouse name: N/A  . Number of children: 3  . Years of education: N/A   Occupational History  . Not on file.   Social History Main Topics  . Smoking status: Former Smoker    Types: Cigarettes    Quit date: 04/30/1965  . Smokeless tobacco: Never Used     Comment: Quit 30 years ago.    . Alcohol use No  . Drug use: No  . Sexual activity: Not on file   Other Topics Concern  . Not on file   Social History Narrative  . No narrative on file    Current Outpatient Prescriptions  Medication Sig Dispense Refill  . acetaminophen (TYLENOL) 325 MG tablet Take 650 mg by mouth every 6 (six) hours as needed for headache.    Marland Kitchen aspirin EC 81 MG tablet Take 81 mg by mouth daily.    . chlorhexidine (PERIDEX) 0.12 % solution Rinse with 15 mls twice daily for 30 seconds. Use after breakfast and at bedtime. Spit out excess. Do not swallow. 480 mL prn  . Ferrous Sulfate 27 MG TABS Take 27 mg by mouth daily.    . furosemide (LASIX) 40 MG tablet Take 1 tablet (40 mg total) by mouth daily. 30 tablet 2  . Multiple Vitamin (MULTIVITAMIN WITH MINERALS) TABS tablet Take 1 tablet by mouth daily.     No current facility-administered medications for this visit.     Allergies  Allergen Reactions  . No Known Allergies       Review of Systems:  As per HPI - remainder unchanged from previously     Physical Exam:   BP 114/60   Pulse 88   Resp 20   Ht 6' (1.829 m)   Wt 228 lb (103.4 kg)   SpO2 93% Comment: RA  BMI 30.92 kg/m   General:  Moderately obese,  well-appearing  HEENT:  Unremarkable   Neck:   no JVD, no bruits, no adenopathy   Chest:   clear to auscultation, symmetrical breath sounds, no wheezes, no rhonchi  CV:   RRR, prominent to and fro diastolic murmur   Abdomen:  soft, non-tender, no masses     Extremities:  warm, well-perfused, pulses diminished but palpable, no LE edema  Rectal/GU  Deferred  Neuro:   Grossly non-focal and symmetrical throughout  Skin:   Clean and dry, no rashes, no breakdown   Diagnostic Tests:  Right/Left Heart Cath and Coronary Angiography  Conclusion     Ost RCA lesion, 70 %stenosed.  Prox Cx to Mid Cx lesion, 30 %stenosed.  Mid LAD lesion, 20 %stenosed.  Ost 2nd Diag lesion, 20 %stenosed.  Dist LAD lesion, 40 %stenosed.  LV end diastolic pressure is mildly elevated.   1. Severe stenosis ostium of the large dominant RCA. There is dampening of the pressure with catheter engagement of this vessel suggesting the stenosis is severe.  2. Mild  disease in the Circumflex and LAD 3. Mildly elevated LV filling pressure 4. Dilated ascending aorta.   Recommendation: He will continue planning for AVR. He will need AVR and bypass of the RCA with special attention paid to his ascending aorta. He has follow up in 2 weeks with Dr. Roxy Manns.    Indications   Coronary artery disease involving native coronary artery of native heart without angina pectoris [I25.10 (ICD-10-CM)]  Aortic valve disorder [I35.9 (ICD-10-CM)]  Procedural Details/Technique   Technical Details Indication: 71 yo male with recent admission with bacteremia and endocarditis, bicuspid aortic valve who is here today for right and left heart cath with pending follow up in the CT surgery office to discuss AVR.   Procedure: The risks, benefits, complications, treatment options, and expected outcomes were discussed with the patient. The patient and/or family concurred with the proposed plan, giving informed consent. The patient was brought to the cath lab after IV hydration was begun and oral premedication was given. The patient was further sedated with Versed and Fentanyl. We could not obtain IV access in the right antecubital area. The right groin was prepped and draped. A 7 French sheath was  placed in the right femoral vein. Right heart cath performed with a balloon tipped catheter. The right wrist was prepped and draped in a sterile fashion. 1% lidocaine was used for local anesthesia. Using the modified Seldinger access technique, a 5 French sheath was placed in the right radial artery. 3 mg Verapamil was given through the sheath. Due to the enlargement of his aortic root, I could easily engage the coronaries. I was able to engage the RCA with the JR4 catheter. Selective angiography was performed. I could not engage the left main from the radial approach. I then placed a 5 French sheath in the right femoral artery. I was able to engage the left main with a JL6 catheter. Selective angiography was performed. LV pressures measured. NO LV gram performed.   The sheath was removed from the right radial artery and a Terumo hemostasis band was applied at the arteriotomy site on the right wrist.     Estimated blood loss <50 mL.  During this procedure the patient was administered the following to achieve and maintain moderate conscious sedation: Versed 3 mg, Fentanyl 75 mcg, while the patient's heart rate, blood pressure, and oxygen saturation were continuously monitored. The period of conscious sedation was 69 minutes, of which I was present face-to-face 100% of this time.    Complications   Complications documented before study signed (08/04/2016 7:16 AM EDT)    RIGHT/LEFT HEART CATH AND CORONARY ANGIOGRAPHY   None Documented by Burnell Blanks, MD 08/03/2016 2:04 PM EDT  Time Range: Intra-procedure      Coronary Findings   Dominance: Right  Left Anterior Descending  Vessel is large.  Mid LAD lesion, 20% stenosed.  Dist LAD lesion, 40% stenosed.  First Diagonal Branch  Vessel is small in size.  First Septal Branch  Vessel is small in size.  Second Diagonal Branch  Vessel is moderate in size.  Ost 2nd Diag lesion, 20% stenosed.  Second Septal Branch  Vessel is small in  size.  Third Diagonal Branch  Vessel is small in size.  Third Septal Branch  Vessel is small in size.  Left Circumflex  Vessel is large.  Prox Cx to Mid Cx lesion, 30% stenosed.  First Obtuse Marginal Branch  Vessel is small in size.  Second Obtuse Marginal Branch  Vessel is moderate  in size.  Third Obtuse Marginal Branch  Vessel is moderate in size.  Right Coronary Artery  Ost RCA lesion, 70% stenosed.  Left Heart   Left Ventricle LV end diastolic pressure is mildly elevated.    Coronary Diagrams   Diagnostic Diagram       Implants     No implant documentation for this case.  PACS Images   Show images for Cardiac catheterization   Link to Procedure Log   Procedure Log    Hemo Data    Most Recent Value  Fick Cardiac Output 6.35 L/min  Fick Cardiac Output Index 2.82 (L/min)/BSA  RA A Wave 6 mmHg  RA V Wave 3 mmHg  RA Mean 3 mmHg  RV Systolic Pressure 24 mmHg  RV Diastolic Pressure -1 mmHg  RV EDP 3 mmHg  PA Systolic Pressure 24 mmHg  PA Diastolic Pressure 9 mmHg  PA Mean 15 mmHg  PW A Wave 15 mmHg  PW V Wave 15 mmHg  PW Mean 12 mmHg  AO Systolic Pressure 287 mmHg  AO Diastolic Pressure 51 mmHg  AO Mean 86 mmHg  LV Systolic Pressure 867 mmHg  LV Diastolic Pressure 3 mmHg  LV EDP 24 mmHg  Arterial Occlusion Pressure Extended Systolic Pressure 672 mmHg  Arterial Occlusion Pressure Extended Diastolic Pressure 55 mmHg  Arterial Occlusion Pressure Extended Mean Pressure 87 mmHg  Left Ventricular Apex Extended Systolic Pressure 094 mmHg  Left Ventricular Apex Extended Diastolic Pressure 4 mmHg  Left Ventricular Apex Extended EDP Pressure 26 mmHg  QP/QS 1  TPVR Index 5.31 HRUI  TSVR Index 30.48 HRUI  PVR SVR Ratio 0.04  TPVR/TSVR Ratio 0.17      Impression:  Patient has at least stage CI severe asymptomatic aortic insufficiency in the setting of bicuspid aortic valve with 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  He has not had a follow-up echocardiogram since he left the hospital, but at that time left ventricular function remained close to normal with ejection fraction estimated 55%. There was mild chamber enlargement of the left ventricle with end systolic diameter remaining less than 5 cm. Now that he has recovered from his endocarditis he claims to be entirely asymptomatic and he specifically denies any symptoms of congestive heart failure. The chronicity of his aortic insufficiency is unknown, but recent TEE suggest the possibility of leaflet perforation related to his endocarditis.  If we could say for certain there was no leaflet perforation and/or the patient's aortic insufficiency was no worse now than it was prior to the development of endocarditis, continued close follow-up with medical therapy would be reasonable. The patient describes a several year history of known heart murmur and had been evaluated by a cardiologist in the past 2 and discussed the possibility of obtaining a transesophageal echocardiogram. It is possible that his aortic insufficiency is chronic. However, I'm concerned that the patient's aortic insufficiency has been made considerably worse by his recent bout of endocarditis, and this poses increased concerns regarding the merits of watchful waiting on medical therapy. Nevertheless, the patient seems to be doing remarkably well at this time.   Plan:  I discussed the nature of the patient's underlying complex valvular heart disease and coronary artery disease at length with the patient and his wife in the office today. We discussed the indications, risks, and potential benefits of aortic valve repair or replacement in the setting of  severe asymptomatic aortic insufficiency. We discussed clear indications for surgical intervention as well as concerns regarding watchful  waiting, particularly in the setting of possibly acute recent change in the severity of the patient's aortic insufficiency.  We discussed surgical options at length including the need for concomitant coronary artery bypass grafting and replacement of the ascending thoracic aorta.  Discussion was held comparing the relative risks of mechanical valve replacement with need for lifelong anticoagulation versus use of a bioprosthetic tissue valve and the associated potential for late structural valve deterioration and failure.  All of the questions have been addressed.  The patient desires to discussed matters further with his wife and family in takes some time before making a final decision. If the patient is inclined to wish to proceed with surgery in the near future we would plan to proceed with cardiac gated CT angiogram of the heart to further evaluate the precise dimensions of the aortic root and proximal ascending thoracic aorta as well as anatomic considerations which may affect whether not his valve might be repairable. The patient will contact us in the near future to make subsequent plans for follow-up. All of his questions have been addressed.    I spent in excess of 60 minutes during the conduct of this office consultation and >50% of this time involved direct face-to-face encounter with the patient for counseling and/or coordination of their care.    Valentina Gu. Roxy Manns, MD 08/17/2016 2:51 PM

## 2016-08-17 NOTE — Patient Instructions (Signed)
Continue all previous medications without any changes at this time  Call when you have decided whether or not to proceed with surgery in the near future

## 2016-08-24 ENCOUNTER — Ambulatory Visit (HOSPITAL_COMMUNITY): Payer: Self-pay | Admitting: Dentistry

## 2016-08-25 ENCOUNTER — Ambulatory Visit (HOSPITAL_COMMUNITY): Payer: Self-pay | Admitting: Dentistry

## 2016-08-25 ENCOUNTER — Encounter (HOSPITAL_COMMUNITY): Payer: Self-pay | Admitting: Dentistry

## 2016-08-25 VITALS — BP 123/33 | HR 66 | Temp 98.2°F

## 2016-08-25 DIAGNOSIS — K08199 Complete loss of teeth due to other specified cause, unspecified class: Secondary | ICD-10-CM

## 2016-08-25 DIAGNOSIS — I351 Nonrheumatic aortic (valve) insufficiency: Secondary | ICD-10-CM

## 2016-08-25 DIAGNOSIS — K08409 Partial loss of teeth, unspecified cause, unspecified class: Secondary | ICD-10-CM

## 2016-08-25 DIAGNOSIS — Z01818 Encounter for other preprocedural examination: Secondary | ICD-10-CM

## 2016-08-25 NOTE — Patient Instructions (Signed)
PLAN: 1. Continue chlorhexidine rinses as prescribed. 2. Use salt water rinses as needed in between the chlorhexidine rinses to aid healing 3. Brush teeth after meals and at bedtime. Floss at bedtime. 4. Follow-up with his primary dentist, Dr. Aquilla Solian,  for regular dental care once medically stable from the anticipated heart valve surgery. Patient will require antibiotic premedication prior to invasive dental procedures due to the history of bacterial endocarditis and anticipated heart valve surgery.   Lenn Cal, DDS

## 2016-08-25 NOTE — Progress Notes (Signed)
POST OPERATIVE NOTE:  08/25/2016 Charles [name] [id number]  VITALS: BP (!) 123/33 (BP Location: Left Arm)   Pulse 66   Temp 98.2 F (36.8 C) (Oral)   LABS:  Lab Results  Component Value Date   WBC 9.8 08/07/2016   HGB 10.5 (L) 08/07/2016   HCT 33.2 (L) 08/07/2016   MCV 80.2 08/07/2016   PLT 268 08/07/2016   BMET    Component Value Date/Time   NA 137 08/07/2016 2019   K 3.7 08/07/2016 2019   CL 102 08/07/2016 2019   CO2 26 08/07/2016 2019   GLUCOSE 110 (H) 08/07/2016 2019   BUN 22 (H) 08/07/2016 2019   CREATININE 1.17 08/07/2016 2019   CALCIUM 8.9 08/07/2016 2019   GFRNONAA >60 08/07/2016 2019   GFRAA >60 08/07/2016 2019    Lab Results  Component Value Date   INR 1.06 07/29/2016   No results found for: PTT   Charles [name] is status post extraction tooth numbers 2, 4, 14 with alveoloplasty in the operating room on 08/13/2016. Patient now presents for evaluation of healing and suture removal as needed.  SUBJECTIVE: Patient without complaints from dental extraction sites. "I only had use 1 or 2 pain pills".  EXAM: There is no sign of infection, heme, or ooze. Sutures are loosely intact. Patient will need to heal in by secondary intention involving the upper right and upper left extraction sites. Patient denies having any sinus problems. Valsalva maneuver was negative. Plaque noted and oral hygiene improvement was highly suggested.   PROCEDURE: The patient was given a chlorhexidine gluconate rinse for 30 seconds. Sutures were then removed without complication. Patient tolerated the procedure well.  ASSESSMENT: Post operative course is consistent with dental procedures performed in the operating room under general anesthesia. Loss of teeth due to extraction  PLAN: 1. Continue chlorhexidine rinses as prescribed. 2. Use salt water rinses as needed in between the chlorhexidine rinses to aid healing 3. Brush teeth after meals and at bedtime. Floss at bedtime. 4.  Follow-up with his primary dentist, Dr. Aquilla Solian,  for regular dental care once medically stable from the anticipated heart valve surgery. Patient will require antibiotic premedication prior to invasive dental procedures due to the history of bacterial endocarditis and anticipated heart valve surgery.   Lenn Cal, DDS

## 2016-09-01 ENCOUNTER — Encounter: Payer: Self-pay | Admitting: Cardiology

## 2016-09-01 NOTE — Progress Notes (Signed)
Cardiology Office Note  Date: 09/02/2016   ID: Charles [name], DOB October 16, 1945, MRN [id number]  PCP: [name], MD  Primary Cardiologist: Charles Lesches, MD   Chief Complaint  Patient presents with  . Aortic regurgitation  . Coronary Artery Disease    History of Present Illness: Charles [name] is a 71 y.o. male that I met in April of this year, please refer to my previous note for extensive discussion. Since that encounter he has undergone a cardiac catheterization as well as dental surgery in preparation for aortic valve surgery with Dr. Roxy Manns. I reviewed the recent chart, he saw Dr. Roxy Manns on May 7 for surgical discussion, and patient had not yet made a decision at that time, reporting improved symptoms overall since his episode of endocarditis. He presents today for a follow-up visit, states that he plans to seek a second opinion with Dr. Einar [name] and will be seeing him in the next few weeks.  He tells me that he has no angina symptoms, generally NYHA class II dyspnea as long as he paces himself. He has done some yard work, rests when he needs to. Current cardiac medications include aspirin and Lasix. We discussed provided prescription for as needed nitroglycerin as well.  Valve imaging by echocardiography and TEE was last done in March in the acute hospital setting.  Cardiac catheterization report is outlined below. Patient did have an ostial RCA stenosis that was felt to be significant. I discussed this with him today. In the absence of angina symptoms it is not entirely clear that this needs to be intervened on in the near future, but this may come into play if valve surgery is deferred for a significant period of time.  Past Medical History:  Diagnosis Date  . Anemia   . Aortic insufficiency    a. 07/07/2016: TEE showing bicuspid aortic valve with severe eccentric AI   . Ascending aortic aneurysm (HCC)    4.4 cm by CT 06/2016  . 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 Bicuspid aortic valve   . Chronic periodontitis   . Coronary artery disease   . History of pneumonia 1978    Past Surgical History:  Procedure Laterality Date  . HERNIA REPAIR Left   . MULTIPLE EXTRACTIONS WITH ALVEOLOPLASTY N/A 08/13/2016   Procedure: Extraction of tooth #'s 2,4,14 with alveoloplasty;  Surgeon: Charles [name], DDS;  Location: [facility];  Service: Oral Surgery;  Laterality: N/A;  . MULTIPLE TOOTH EXTRACTIONS    . RIGHT/LEFT HEART CATH AND CORONARY ANGIOGRAPHY N/A 08/03/2016   Procedure: Right/Left Heart Cath and Coronary Angiography;  Surgeon: Charles Blanks, MD;  Location: [facility];  Service: Cardiovascular;  Laterality: N/A;  . TEE WITHOUT CARDIOVERSION N/A 07/07/2016   Procedure: TRANSESOPHAGEAL ECHOCARDIOGRAM (TEE);  Surgeon: Charles Dresser, MD;  Location: [facility] END[facility];  Service: Cardiovascular;  Laterality: N/A;    Current Outpatient Prescriptions  Medication Sig Dispense Refill  . acetaminophen (TYLENOL) 325 MG tablet Take 650 mg by mouth every 6 (six) hours as needed for headache.    Marland Kitchen aspirin EC 81 MG tablet Take 81 mg by mouth daily.    . chlorhexidine (PERIDEX) 0.12 % solution Rinse with 15 mls twice daily for 30 seconds. Use after breakfast and at bedtime. Spit out excess. Do not swallow. 480 mL prn  . Ferrous Sulfate 27 MG TABS Take 27 mg by mouth daily.    . furosemide (LASIX) 40 MG tablet  Take 1 tablet (40 mg total) by mouth daily. 30 tablet 2  . Multiple Vitamin (MULTIVITAMIN WITH MINERALS) TABS tablet Take 1 tablet by mouth daily.    . nitroGLYCERIN (NITROSTAT) 0.4 MG SL tablet Place 1 tablet (0.4 mg total) under the tongue every 5 (five) minutes as needed. 25 tablet 3   No current facility-administered medications for this visit.    Allergies:  No known allergies   Social History: The patient  reports that he quit smoking about 51 years ago. His smoking use included Cigarettes. He has never used smokeless tobacco. He  reports that he does not drink alcohol or use drugs.   ROS:  Please see the history of present illness. Otherwise, complete review of systems is positive for none.  All other systems are reviewed and negative.   Physical Exam: VS:  BP 120/60   Pulse 80   Ht 6' (1.829 m)   Wt 231 lb (104.8 kg)   SpO2 95%   BMI 31.33 kg/m , BMI Body mass index is 31.33 kg/m.  Wt Readings from Last 3 Encounters:  09/02/16 231 lb (104.8 kg)  08/17/16 228 lb (103.4 kg)  08/07/16 228 lb (103.4 kg)    General: Obese male, appears comfortable at rest. HEENT: Conjunctiva and lids normal, oropharynx clear. Neck: Supple, no elevated JVP or carotid bruits, no thyromegaly. Lungs: Clear to auscultation, nonlabored breathing at rest. Cardiac: Regular rate and rhythm, no S3, 3/6 systolic murmur and 3/6 diastolic murmur, no pericardial rub. Abdomen: Soft, nontender, bowel sounds present, no guarding or rebound. Extremities: No pitting edema, distal pulses 2+. Skin: Warm and dry. Musculoskeletal: No kyphosis. Neuropsychiatric: Alert and oriented x3, affect grossly appropriate.  ECG: I personally reviewed the tracing from 07/13/2016 which showed some sinus rhythm with prolonged PR interval, left anterior fascicular block, and increased voltage.  Recent Labwork: 07/01/2016: TSH 1.300 07/27/2016: Magnesium 2.5 08/07/2016: ALT 11; AST 16; BUN 22; Creatinine, Ser 1.17; Hemoglobin 10.5; Platelets 268; Potassium 3.7; Sodium 137     Component Value Date/Time   CHOL 122 07/03/2016 0715   TRIG 125 07/03/2016 0715   HDL 16 (L) 07/03/2016 0715   CHOLHDL 7.6 07/03/2016 0715   VLDL 25 07/03/2016 0715   LDLCALC 81 07/03/2016 0715    Other Studies Reviewed Today:  Cardiac catheterization 08/03/2016:  Ost RCA lesion, 70 %stenosed.  Prox Cx to Mid Cx lesion, 30 %stenosed.  Mid LAD lesion, 20 %stenosed.  Ost 2nd Diag lesion, 20 %stenosed.  Dist LAD lesion, 40 %stenosed.  LV end diastolic pressure is mildly  elevated.   1. Severe stenosis ostium of the large dominant RCA. There is dampening of the pressure with catheter engagement of this vessel suggesting the stenosis is severe.  2. Mild disease in the Circumflex and LAD 3. Mildly elevated LV filling pressure 4. Dilated ascending aorta.   Assessment and Plan:  1. Bicuspid aortic valve with severe eccentric aortic regurgitation by recent imaging studies. Patient states that he has a long-standing heart murmur and was followed by cardiologist with testing a few years ago in [place]. He also had a recent episode of viridans streptococcal bacteremia and endocarditis, so it is not clear whether he has had a gradual progression in aortic regurgitation over time or a more recent decompensation. Dr. Roxy Manns was concerned that the patient may have had some component of aortic leaflet perforation. From a symptomatic perspective, Mr. Barkdull states that he feels better at this point. Timing of valve surgery is obviously the main  question to address at this point, Dr. Roxy Manns has recommended that this be done generally sooner rather than later, although a period of watchful waiting would not be out of the question. I would generally concur with pursuing surgery sooner rather than later and in a stable clinical situation if possible. He also has an ascending aortic aneurysm measuring 4.4 cm and an ostial RCA stenosis that would need to be addressed. Mr. Colaizzi tells me that he plans to get a second opinion with Dr. Einar [name] in the next few weeks, and will hopefully be able to make some decisions from there.  2. Ostial RCA stenosis documented by recent cardiac catheterization as outlined above. He does not report any angina at this time and continues on aspirin. Prescription for nitroglycerin also given. If he elects to defer valve surgery, this lesion may ultimately need to be addressed percutaneously.  3. Asymptomatic ascending aortic aneurysm measuring 4.4 cm.  Current  medicines were reviewed with the patient today.  Disposition: Follow-up in one month.  Signed, Satira Sark, MD, [facility] 09/02/2016 9:36 AM    [facility]. [address], [place], [place] [address] Phone: [phone]; Fax: [phone]

## 2016-09-02 ENCOUNTER — Ambulatory Visit (INDEPENDENT_AMBULATORY_CARE_PROVIDER_SITE_OTHER): Payer: Medicare Other | Admitting: Cardiology

## 2016-09-02 ENCOUNTER — Encounter: Payer: Self-pay | Admitting: Cardiology

## 2016-09-02 VITALS — BP 120/60 | HR 80 | Ht 72.0 in | Wt 231.0 lb

## 2016-09-02 DIAGNOSIS — Q231 Congenital insufficiency of aortic valve: Secondary | ICD-10-CM

## 2016-09-02 DIAGNOSIS — I2510 Atherosclerotic heart disease of native coronary artery without angina pectoris: Secondary | ICD-10-CM | POA: Diagnosis not present

## 2016-09-02 DIAGNOSIS — I351 Nonrheumatic aortic (valve) insufficiency: Secondary | ICD-10-CM | POA: Diagnosis not present

## 2016-09-02 DIAGNOSIS — I7121 Aneurysm of the ascending aorta, without rupture: Secondary | ICD-10-CM

## 2016-09-02 DIAGNOSIS — I712 Thoracic aortic aneurysm, without rupture: Secondary | ICD-10-CM

## 2016-09-02 DIAGNOSIS — Q2381 Bicuspid aortic valve: Secondary | ICD-10-CM

## 2016-09-02 MED ORDER — NITROGLYCERIN 0.4 MG SL SUBL: 0.4000 mg | SUBLINGUAL_TABLET | SUBLINGUAL | 3 refills | Status: DC | PRN

## 2016-09-02 NOTE — Patient Instructions (Signed)
Your physician recommends that you schedule a follow-up appointment in:  1 month Dr.McDowell   Take nitro glycerine as directed   Nitroglycerin sublingual tablets What is this medicine? NITROGLYCERIN (nye troe GLI ser in) is a type of vasodilator. It relaxes blood vessels, increasing the blood and oxygen supply to your heart. This medicine is used to relieve chest pain caused by angina. It is also used to prevent chest pain before activities like climbing stairs, going outdoors in cold weather, or sexual activity. This medicine may be used for other purposes; ask your health care provider or pharmacist if you have questions. COMMON BRAND NAME(S): Nitroquick, Nitrostat, Nitrotab What should I tell my health care provider before I take this medicine? They need to know if you have any of these conditions: -anemia -head injury, recent stroke, or bleeding in the brain -liver disease -previous heart attack -an unusual or allergic reaction to nitroglycerin, other medicines, foods, dyes, or preservatives -pregnant or trying to get pregnant -breast-feeding How should I use this medicine? Take this medicine by mouth as needed. At the first sign of an angina attack (chest pain or tightness) place one tablet under your tongue. You can also take this medicine 5 to 10 minutes before an event likely to produce chest pain. Follow the directions on the prescription label. Let the tablet dissolve under the tongue. Do not swallow whole. Replace the dose if you accidentally swallow it. It will help if your mouth is not dry. Saliva around the tablet will help it to dissolve more quickly. Do not eat or drink, smoke or chew tobacco while a tablet is dissolving. If you are not better within 5 minutes after taking ONE dose of nitroglycerin, call 9-1-1 immediately to seek emergency medical care. Do not take more than 3 nitroglycerin tablets over 15 minutes. If you take this medicine often to relieve symptoms of angina,  your doctor or health care professional may provide you with different instructions to manage your symptoms. If symptoms do not go away after following these instructions, it is important to call 9-1-1 immediately. Do not take more than 3 nitroglycerin tablets over 15 minutes. Talk to your pediatrician regarding the use of this medicine in children. Special care may be needed. Overdosage: If you think you have taken too much of this medicine contact a poison control center or emergency room at once. NOTE: This medicine is only for you. Do not share this medicine with others. What if I miss a dose? This does not apply. This medicine is only used as needed. What may interact with this medicine? Do not take this medicine with any of the following medications: -certain migraine medicines like ergotamine and dihydroergotamine (DHE) -medicines used to treat erectile dysfunction like sildenafil, tadalafil, and vardenafil -riociguat This medicine may also interact with the following medications: -alteplase -aspirin -heparin -medicines for high blood pressure -medicines for mental depression -other medicines used to treat angina -phenothiazines like chlorpromazine, mesoridazine, prochlorperazine, thioridazine This list may not describe all possible interactions. Give your health care provider a list of all the medicines, herbs, non-prescription drugs, or dietary supplements you use. Also tell them if you smoke, drink alcohol, or use illegal drugs. Some items may interact with your medicine. What should I watch for while using this medicine? Tell your doctor or health care professional if you feel your medicine is no longer working. Keep this medicine with you at all times. Sit or lie down when you take your medicine to prevent falling if  you feel dizzy or faint after using it. Try to remain calm. This will help you to feel better faster. If you feel dizzy, take several deep breaths and lie down with  your feet propped up, or bend forward with your head resting between your knees. You may get drowsy or dizzy. Do not drive, use machinery, or do anything that needs mental alertness until you know how this drug affects you. Do not stand or sit up quickly, especially if you are an older patient. This reduces the risk of dizzy or fainting spells. Alcohol can make you more drowsy and dizzy. Avoid alcoholic drinks. Do not treat yourself for coughs, colds, or pain while you are taking this medicine without asking your doctor or health care professional for advice. Some ingredients may increase your blood pressure. What side effects may I notice from receiving this medicine? Side effects that you should report to your doctor or health care professional as soon as possible: -blurred vision -dry mouth -skin rash -sweating -the feeling of extreme pressure in the head -unusually weak or tired Side effects that usually do not require medical attention (report to your doctor or health care professional if they continue or are bothersome): -flushing of the face or neck -headache -irregular heartbeat, palpitations -nausea, vomiting This list may not describe all possible side effects. Call your doctor for medical advice about side effects. You may report side effects to FDA at 1-800-FDA-1088. Where should I keep my medicine? Keep out of the reach of children. Store at room temperature between 20 and 25 degrees C (68 and 77 degrees F). Store in [employer]. Protect from light and moisture. Keep tightly closed. Throw away any unused medicine after the expiration date. NOTE: This sheet is a summary. It may not cover all possible information. If you have questions about this medicine, talk to your doctor, pharmacist, or health care provider.  2018 Elsevier/Gold Standard (2013-01-26 17:57:36)      Thank you for choosing [facility] !

## 2016-09-08 ENCOUNTER — Ambulatory Visit (INDEPENDENT_AMBULATORY_CARE_PROVIDER_SITE_OTHER): Payer: Medicare Other | Admitting: Internal Medicine

## 2016-09-08 ENCOUNTER — Encounter: Payer: Self-pay | Admitting: Internal Medicine

## 2016-09-08 DIAGNOSIS — I330 Acute and subacute infective endocarditis: Secondary | ICD-10-CM

## 2016-09-08 DIAGNOSIS — B955 Unspecified streptococcus as the cause of diseases classified elsewhere: Secondary | ICD-10-CM

## 2016-09-08 NOTE — Assessment & Plan Note (Signed)
I strongly suspect that his endocarditis has been cured. He will remain off of antibiotics. I did talk to him about the need for prophylactic antibiotics before dental procedures. He should take amoxicillin 2 g before the procedure. He can follow-up here as needed.

## 2016-09-08 NOTE — Progress Notes (Signed)
[facility]  Patient Active Problem List   Diagnosis Date Noted  . Streptococcal endocarditis     Priority: High  . Aortic valve disorder   . Coronary artery disease involving native coronary artery of native heart without angina pectoris   . Second degree AV block, Mobitz type I 07/08/2016  . Weight loss   . Protein-calorie malnutrition, severe 07/03/2016  . Anemia 07/02/2016  . H/O cardiac murmur 07/02/2016  . Ascending aortic aneurysm ([facility]) 07/02/2016  . NSTEMI (non-ST elevated myocardial infarction) (Uncertain) 07/02/2016  . Bicuspid aortic valve 07/02/2016  . Aortic insufficiency 07/02/2016    Patient's Medications  New Prescriptions   No medications on file  Previous Medications   ACETAMINOPHEN (TYLENOL) 325 MG TABLET    Take 650 mg by mouth every 6 (six) hours as needed for headache.   ASPIRIN EC 81 MG TABLET    Take 81 mg by mouth daily.   CHLORHEXIDINE (PERIDEX) 0.12 % SOLUTION    Rinse with 15 mls twice daily for 30 seconds. Use after breakfast and at bedtime. Spit out excess. Do not swallow.   FERROUS SULFATE 27 MG TABS    Take 27 mg by mouth daily.   FUROSEMIDE (LASIX) 40 MG TABLET    Take 1 tablet (40 mg total) by mouth daily.   MULTIPLE VITAMIN (MULTIVITAMIN WITH MINERALS) TABS TABLET    Take 1 tablet by mouth daily.   NITROGLYCERIN (NITROSTAT) 0.4 MG SL TABLET    Place 1 tablet (0.4 mg total) under the tongue every 5 (five) minutes as needed.  Modified Medications   No medications on file  Discontinued Medications   No medications on file    Subjective: Mr. Matters is in for his routine follow-up visit. He completed 4 weeks of IV ceftriaxone on 07/29/2016 for his subacute viridans streptococcal aortic valve endocarditis. He recently underwent dental surgery by Dr. Valrie Hart. He is feeling much better. He has not had any fever, chills, or sweats since stopping antibiotics. His appetite is good and he is gaining weight. He has  recently been working in his garden and was shoveling rock. He had some dyspnea on exertion but does not feel this is any worse than his baseline.  Review of Systems: Review of Systems  Constitutional: Negative for chills, diaphoresis, fever and weight loss.  Respiratory: Positive for shortness of breath. Negative for cough and sputum production.   Cardiovascular: Negative for chest pain.    Past Medical History:  Diagnosis Date  . Anemia   . Aortic insufficiency    a. 07/07/2016: TEE showing bicuspid aortic valve with severe eccentric AI   . Ascending aortic aneurysm (HCC)    4.4 cm by CT 06/2016  . 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 Bicuspid aortic valve   . Chronic periodontitis   . Coronary artery disease   . History of pneumonia 33    Social History  Substance Use Topics  . Smoking status: Former Smoker    Types: Cigarettes    Quit date: 04/30/1965  . Smokeless tobacco: Never Used     Comment: Quit 30 years ago.    . Alcohol use No    Family History  Problem Relation Age of Onset  . Ovarian cancer Mother   . Heart disease Father 11       Died of coronary thrombosis.      Allergies  Allergen Reactions  . No  Known Allergies     Objective: Vitals:   09/08/16 1114  BP: 132/69  Pulse: 69  Temp: 98.2 F (36.8 C)  TempSrc: Oral  SpO2: 95%  Weight: 233 lb (105.7 kg)  Height: 6' (1.829 m)   Body mass index is 31.6 kg/m.  Physical Exam  Constitutional: He is oriented to person, place, and time.  He is in good spirits. His weight is up a few pounds since his last visit.  Cardiovascular: Normal rate and regular rhythm.   Murmur heard. No change in his 2/6 to-and-fro murmur.  Pulmonary/Chest: Effort normal and breath sounds normal. He has no wheezes. He has no rales.  Abdominal: Soft. There is no tenderness.  Musculoskeletal: Normal range of motion. He exhibits no edema or tenderness.  Neurological: He is alert and oriented to  person, place, and time. Gait normal.  Psychiatric: Mood and affect normal.    Lab Results    Problem List Items Addressed This Visit      High   Streptococcal endocarditis    I strongly suspect that his endocarditis has been cured. He will remain off of antibiotics. I did talk to him about the need for prophylactic antibiotics before dental procedures. He should take amoxicillin 2 g before the procedure. He can follow-up here as needed.          Michel Bickers, MD [facility] [phone] pager   [phone] cell 09/08/2016, 11:47 AM

## 2016-09-21 DIAGNOSIS — I10 Essential (primary) hypertension: Secondary | ICD-10-CM | POA: Diagnosis not present

## 2016-09-21 DIAGNOSIS — I2510 Atherosclerotic heart disease of native coronary artery without angina pectoris: Secondary | ICD-10-CM | POA: Diagnosis not present

## 2016-09-21 DIAGNOSIS — Q231 Congenital insufficiency of aortic valve: Secondary | ICD-10-CM | POA: Diagnosis not present

## 2016-09-21 DIAGNOSIS — I351 Nonrheumatic aortic (valve) insufficiency: Secondary | ICD-10-CM | POA: Diagnosis not present

## 2016-09-24 ENCOUNTER — Emergency Department (HOSPITAL_COMMUNITY): Payer: Medicare Other

## 2016-09-24 ENCOUNTER — Encounter (HOSPITAL_COMMUNITY): Payer: Self-pay | Admitting: Emergency Medicine

## 2016-09-24 ENCOUNTER — Inpatient Hospital Stay (HOSPITAL_COMMUNITY)
Admission: EM | Admit: 2016-09-24 | Discharge: 2016-10-15 | DRG: 216 | Disposition: A | Discharge status: Skilled Nursing Facility | Payer: Medicare Other | Attending: Thoracic Surgery (Cardiothoracic Vascular Surgery) | Admitting: Thoracic Surgery (Cardiothoracic Vascular Surgery)

## 2016-09-24 DIAGNOSIS — I330 Acute and subacute infective endocarditis: Principal | ICD-10-CM

## 2016-09-24 DIAGNOSIS — I340 Nonrheumatic mitral (valve) insufficiency: Secondary | ICD-10-CM | POA: Diagnosis not present

## 2016-09-24 DIAGNOSIS — J90 Pleural effusion, not elsewhere classified: Secondary | ICD-10-CM | POA: Diagnosis not present

## 2016-09-24 DIAGNOSIS — J9811 Atelectasis: Secondary | ICD-10-CM | POA: Diagnosis not present

## 2016-09-24 DIAGNOSIS — I499 Cardiac arrhythmia, unspecified: Secondary | ICD-10-CM | POA: Diagnosis not present

## 2016-09-24 DIAGNOSIS — R0601 Orthopnea: Secondary | ICD-10-CM | POA: Diagnosis not present

## 2016-09-24 DIAGNOSIS — E1122 Type 2 diabetes mellitus with diabetic chronic kidney disease: Secondary | ICD-10-CM | POA: Diagnosis present

## 2016-09-24 DIAGNOSIS — I441 Atrioventricular block, second degree: Secondary | ICD-10-CM | POA: Diagnosis present

## 2016-09-24 DIAGNOSIS — G4700 Insomnia, unspecified: Secondary | ICD-10-CM | POA: Diagnosis not present

## 2016-09-24 DIAGNOSIS — I088 Other rheumatic multiple valve diseases: Secondary | ICD-10-CM | POA: Diagnosis not present

## 2016-09-24 DIAGNOSIS — Z4682 Encounter for fitting and adjustment of non-vascular catheter: Secondary | ICD-10-CM | POA: Diagnosis not present

## 2016-09-24 DIAGNOSIS — I4891 Unspecified atrial fibrillation: Secondary | ICD-10-CM | POA: Diagnosis present

## 2016-09-24 DIAGNOSIS — I25118 Atherosclerotic heart disease of native coronary artery with other forms of angina pectoris: Secondary | ICD-10-CM | POA: Diagnosis not present

## 2016-09-24 DIAGNOSIS — I700 Atherosclerosis of aorta: Secondary | ICD-10-CM | POA: Diagnosis not present

## 2016-09-24 DIAGNOSIS — I351 Nonrheumatic aortic (valve) insufficiency: Secondary | ICD-10-CM | POA: Diagnosis not present

## 2016-09-24 DIAGNOSIS — Z952 Presence of prosthetic heart valve: Secondary | ICD-10-CM | POA: Diagnosis not present

## 2016-09-24 DIAGNOSIS — Z01810 Encounter for preprocedural cardiovascular examination: Secondary | ICD-10-CM | POA: Diagnosis not present

## 2016-09-24 DIAGNOSIS — R001 Bradycardia, unspecified: Secondary | ICD-10-CM | POA: Diagnosis not present

## 2016-09-24 DIAGNOSIS — Q211 Atrial septal defect: Secondary | ICD-10-CM | POA: Diagnosis not present

## 2016-09-24 DIAGNOSIS — I719 Aortic aneurysm of unspecified site, without rupture: Secondary | ICD-10-CM | POA: Diagnosis not present

## 2016-09-24 DIAGNOSIS — B9689 Other specified bacterial agents as the cause of diseases classified elsewhere: Secondary | ICD-10-CM | POA: Diagnosis not present

## 2016-09-24 DIAGNOSIS — I214 Non-ST elevation (NSTEMI) myocardial infarction: Secondary | ICD-10-CM | POA: Diagnosis present

## 2016-09-24 DIAGNOSIS — Z79899 Other long term (current) drug therapy: Secondary | ICD-10-CM

## 2016-09-24 DIAGNOSIS — Z951 Presence of aortocoronary bypass graft: Secondary | ICD-10-CM

## 2016-09-24 DIAGNOSIS — I472 Ventricular tachycardia, unspecified: Secondary | ICD-10-CM

## 2016-09-24 DIAGNOSIS — I5033 Acute on chronic diastolic (congestive) heart failure: Secondary | ICD-10-CM | POA: Diagnosis present

## 2016-09-24 DIAGNOSIS — N179 Acute kidney failure, unspecified: Secondary | ICD-10-CM | POA: Diagnosis not present

## 2016-09-24 DIAGNOSIS — R002 Palpitations: Secondary | ICD-10-CM

## 2016-09-24 DIAGNOSIS — R0602 Shortness of breath: Secondary | ICD-10-CM | POA: Diagnosis not present

## 2016-09-24 DIAGNOSIS — I509 Heart failure, unspecified: Secondary | ICD-10-CM

## 2016-09-24 DIAGNOSIS — R0682 Tachypnea, not elsewhere classified: Secondary | ICD-10-CM

## 2016-09-24 DIAGNOSIS — I714 Abdominal aortic aneurysm, without rupture: Secondary | ICD-10-CM | POA: Diagnosis not present

## 2016-09-24 DIAGNOSIS — I7121 Aneurysm of the ascending aorta, without rupture: Secondary | ICD-10-CM | POA: Diagnosis present

## 2016-09-24 DIAGNOSIS — N183 Chronic kidney disease, stage 3 (moderate): Secondary | ICD-10-CM | POA: Diagnosis not present

## 2016-09-24 DIAGNOSIS — M6281 Muscle weakness (generalized): Secondary | ICD-10-CM | POA: Diagnosis not present

## 2016-09-24 DIAGNOSIS — R0600 Dyspnea, unspecified: Secondary | ICD-10-CM | POA: Diagnosis not present

## 2016-09-24 DIAGNOSIS — Q231 Congenital insufficiency of aortic valve: Secondary | ICD-10-CM

## 2016-09-24 DIAGNOSIS — G4701 Insomnia due to medical condition: Secondary | ICD-10-CM | POA: Diagnosis not present

## 2016-09-24 DIAGNOSIS — I2510 Atherosclerotic heart disease of native coronary artery without angina pectoris: Secondary | ICD-10-CM | POA: Diagnosis not present

## 2016-09-24 DIAGNOSIS — I5031 Acute diastolic (congestive) heart failure: Secondary | ICD-10-CM

## 2016-09-24 DIAGNOSIS — I350 Nonrheumatic aortic (valve) stenosis: Secondary | ICD-10-CM | POA: Diagnosis not present

## 2016-09-24 DIAGNOSIS — I5040 Unspecified combined systolic (congestive) and diastolic (congestive) heart failure: Secondary | ICD-10-CM | POA: Diagnosis not present

## 2016-09-24 DIAGNOSIS — Z8679 Personal history of other diseases of the circulatory system: Secondary | ICD-10-CM | POA: Diagnosis not present

## 2016-09-24 DIAGNOSIS — Z954 Presence of other heart-valve replacement: Secondary | ICD-10-CM

## 2016-09-24 DIAGNOSIS — E876 Hypokalemia: Secondary | ICD-10-CM | POA: Diagnosis present

## 2016-09-24 DIAGNOSIS — I359 Nonrheumatic aortic valve disorder, unspecified: Secondary | ICD-10-CM | POA: Diagnosis not present

## 2016-09-24 DIAGNOSIS — D649 Anemia, unspecified: Secondary | ICD-10-CM | POA: Diagnosis not present

## 2016-09-24 DIAGNOSIS — B954 Other streptococcus as the cause of diseases classified elsewhere: Secondary | ICD-10-CM | POA: Diagnosis not present

## 2016-09-24 DIAGNOSIS — J810 Acute pulmonary edema: Secondary | ICD-10-CM | POA: Diagnosis not present

## 2016-09-24 DIAGNOSIS — I429 Cardiomyopathy, unspecified: Secondary | ICD-10-CM | POA: Diagnosis not present

## 2016-09-24 DIAGNOSIS — Z8774 Personal history of (corrected) congenital malformations of heart and circulatory system: Secondary | ICD-10-CM

## 2016-09-24 DIAGNOSIS — I358 Other nonrheumatic aortic valve disorders: Secondary | ICD-10-CM | POA: Diagnosis not present

## 2016-09-24 DIAGNOSIS — I5023 Acute on chronic systolic (congestive) heart failure: Secondary | ICD-10-CM | POA: Diagnosis not present

## 2016-09-24 DIAGNOSIS — I712 Thoracic aortic aneurysm, without rupture: Secondary | ICD-10-CM | POA: Diagnosis not present

## 2016-09-24 DIAGNOSIS — R262 Difficulty in walking, not elsewhere classified: Secondary | ICD-10-CM | POA: Diagnosis not present

## 2016-09-24 DIAGNOSIS — I352 Nonrheumatic aortic (valve) stenosis with insufficiency: Secondary | ICD-10-CM | POA: Diagnosis not present

## 2016-09-24 DIAGNOSIS — I5043 Acute on chronic combined systolic (congestive) and diastolic (congestive) heart failure: Secondary | ICD-10-CM | POA: Diagnosis not present

## 2016-09-24 DIAGNOSIS — D62 Acute posthemorrhagic anemia: Secondary | ICD-10-CM | POA: Diagnosis not present

## 2016-09-24 DIAGNOSIS — I4901 Ventricular fibrillation: Secondary | ICD-10-CM | POA: Diagnosis present

## 2016-09-24 DIAGNOSIS — I517 Cardiomegaly: Secondary | ICD-10-CM | POA: Diagnosis not present

## 2016-09-24 DIAGNOSIS — Z95828 Presence of other vascular implants and grafts: Secondary | ICD-10-CM | POA: Diagnosis not present

## 2016-09-24 DIAGNOSIS — I38 Endocarditis, valve unspecified: Secondary | ICD-10-CM | POA: Diagnosis not present

## 2016-09-24 HISTORY — DX: Personal history of (corrected) congenital malformations of heart and circulatory system: Z87.74

## 2016-09-24 HISTORY — DX: Presence of aortocoronary bypass graft: Z95.1

## 2016-09-24 HISTORY — DX: Presence of other heart-valve replacement: Z95.4

## 2016-09-24 LAB — CBC WITH DIFFERENTIAL/PLATELET
BASOS ABS: 0 10*3/uL (ref 0.0–0.1)
Basophils Relative: 0 %
EOS PCT: 4 %
Eosinophils Absolute: 0.4 10*3/uL (ref 0.0–0.7)
HEMATOCRIT: 38.5 % — AB (ref 39.0–52.0)
HEMOGLOBIN: 12.1 g/dL — AB (ref 13.0–17.0)
LYMPHS ABS: 2.4 10*3/uL (ref 0.7–4.0)
LYMPHS PCT: 23 %
MCH: 25.5 pg — AB (ref 26.0–34.0)
MCHC: 31.4 g/dL (ref 30.0–36.0)
MCV: 81.1 fL (ref 78.0–100.0)
Monocytes Absolute: 0.5 10*3/uL (ref 0.1–1.0)
Monocytes Relative: 5 %
NEUTROS ABS: 7 10*3/uL (ref 1.7–7.7)
NEUTROS PCT: 68 %
PLATELETS: 219 10*3/uL (ref 150–400)
RBC: 4.75 MIL/uL (ref 4.22–5.81)
RDW: 16.5 % — ABNORMAL HIGH (ref 11.5–15.5)
WBC: 10.2 10*3/uL (ref 4.0–10.5)

## 2016-09-24 LAB — COMPREHENSIVE METABOLIC PANEL
ALT: 11 U/L — ABNORMAL LOW (ref 17–63)
ANION GAP: 9 (ref 5–15)
AST: 18 U/L (ref 15–41)
Albumin: 3.5 g/dL (ref 3.5–5.0)
Alkaline Phosphatase: 108 U/L (ref 38–126)
BILIRUBIN TOTAL: 0.5 mg/dL (ref 0.3–1.2)
BUN: 23 mg/dL — AB (ref 6–20)
CHLORIDE: 105 mmol/L (ref 101–111)
CO2: 24 mmol/L (ref 22–32)
Calcium: 8.5 mg/dL — ABNORMAL LOW (ref 8.9–10.3)
Creatinine, Ser: 1.25 mg/dL — ABNORMAL HIGH (ref 0.61–1.24)
GFR calc Af Amer: >60 mL/min (ref >60)
GFR, EST NON AFRICAN AMERICAN: 57 mL/min — AB (ref >60)
Glucose, Bld: 111 mg/dL — ABNORMAL HIGH (ref 65–99)
POTASSIUM: 3.6 mmol/L (ref 3.5–5.1)
Sodium: 138 mmol/L (ref 135–145)
TOTAL PROTEIN: 7.1 g/dL (ref 6.5–8.1)

## 2016-09-24 LAB — BRAIN NATRIURETIC PEPTIDE: B NATRIURETIC PEPTIDE 5: 253 pg/mL — AB (ref 0.0–100.0)

## 2016-09-24 LAB — BASIC METABOLIC PANEL
ANION GAP: 10 (ref 5–15)
BUN: 25 mg/dL — ABNORMAL HIGH (ref 6–20)
CALCIUM: 8.3 mg/dL — AB (ref 8.9–10.3)
CHLORIDE: 105 mmol/L (ref 101–111)
CO2: 20 mmol/L — AB (ref 22–32)
CREATININE: 1.47 mg/dL — AB (ref 0.61–1.24)
GFR calc non Af Amer: 47 mL/min — ABNORMAL LOW (ref >60)
GFR, EST AFRICAN AMERICAN: 54 mL/min — AB (ref >60)
Glucose, Bld: 138 mg/dL — ABNORMAL HIGH (ref 65–99)
Potassium: 4.6 mmol/L (ref 3.5–5.1)
SODIUM: 135 mmol/L (ref 135–145)

## 2016-09-24 LAB — TROPONIN I
TROPONIN I: 0.25 ng/mL — AB (ref <0.03)
Troponin I: 0.85 ng/mL (ref <0.03)

## 2016-09-24 MED ORDER — ACETAMINOPHEN 325 MG PO TABS: 650.0000 mg | ORAL_TABLET | ORAL | Status: DC | PRN

## 2016-09-24 MED ORDER — LOSARTAN POTASSIUM 50 MG PO TABS: 50.0000 mg | ORAL_TABLET | Freq: Every day | ORAL | Status: DC

## 2016-09-24 MED ORDER — ASPIRIN EC 81 MG PO TBEC
81.0000 mg | DELAYED_RELEASE_TABLET | Freq: Every day | ORAL | Status: DC
  Administered 2016-09-25 – 2016-10-01 (×7): 81 mg via ORAL
  Filled 2016-09-24 (×7): qty 1

## 2016-09-24 MED ORDER — NITROGLYCERIN 0.4 MG SL SUBL: 0.4000 mg | SUBLINGUAL_TABLET | SUBLINGUAL | Status: DC | PRN

## 2016-09-24 MED ORDER — ASPIRIN 81 MG PO CHEW: 324.0000 mg | CHEWABLE_TABLET | ORAL | Status: AC

## 2016-09-24 MED ORDER — ASPIRIN 300 MG RE SUPP: 300.0000 mg | RECTAL | Status: AC

## 2016-09-24 MED ORDER — FUROSEMIDE 10 MG/ML IJ SOLN
40.0000 mg | Freq: Once | INTRAMUSCULAR | Status: AC
  Administered 2016-09-24: 40 mg via INTRAVENOUS
  Filled 2016-09-24: qty 4

## 2016-09-24 MED ORDER — DEXTROSE 5 % IV SOLN
1.0000 g | Freq: Once | INTRAVENOUS | Status: AC
  Administered 2016-09-24: 1 g via INTRAVENOUS
  Filled 2016-09-24: qty 10

## 2016-09-24 MED ORDER — CHLORHEXIDINE GLUCONATE 0.12 % MT SOLN
15.0000 mL | Freq: Four times a day (QID) | OROMUCOSAL | Status: DC
  Administered 2016-09-24 – 2016-10-01 (×26): 15 mL via OROMUCOSAL
  Filled 2016-09-24 (×20): qty 15

## 2016-09-24 MED ORDER — ATORVASTATIN CALCIUM 80 MG PO TABS
80.0000 mg | ORAL_TABLET | Freq: Every day | ORAL | Status: DC
  Administered 2016-09-24 – 2016-10-01 (×8): 80 mg via ORAL
  Filled 2016-09-24 (×8): qty 1

## 2016-09-24 MED ORDER — FERROUS SULFATE 325 (65 FE) MG PO TABS
325.0000 mg | ORAL_TABLET | Freq: Every day | ORAL | Status: DC
  Administered 2016-09-25 – 2016-09-30 (×3): 325 mg via ORAL
  Filled 2016-09-24 (×7): qty 1

## 2016-09-24 MED ORDER — VANCOMYCIN HCL IN DEXTROSE 1-5 GM/200ML-% IV SOLN
1000.0000 mg | Freq: Once | INTRAVENOUS | Status: AC
  Administered 2016-09-24: 1000 mg via INTRAVENOUS
  Filled 2016-09-24: qty 200

## 2016-09-24 MED ORDER — ENOXAPARIN SODIUM 40 MG/0.4ML ~~LOC~~ SOLN
40.0000 mg | SUBCUTANEOUS | Status: DC
  Administered 2016-09-24: 40 mg via SUBCUTANEOUS
  Filled 2016-09-24: qty 0.4

## 2016-09-24 MED ORDER — ENSURE ENLIVE PO LIQD
237.0000 mL | Freq: Two times a day (BID) | ORAL | Status: DC
  Administered 2016-09-25 – 2016-10-01 (×13): 237 mL via ORAL

## 2016-09-24 MED ORDER — ZOLPIDEM TARTRATE 5 MG PO TABS
5.0000 mg | ORAL_TABLET | Freq: Once | ORAL | Status: AC
  Administered 2016-09-24: 5 mg via ORAL
  Filled 2016-09-24: qty 1

## 2016-09-24 MED ORDER — POTASSIUM CHLORIDE CRYS ER 20 MEQ PO TBCR
40.0000 meq | EXTENDED_RELEASE_TABLET | Freq: Once | ORAL | Status: AC
  Administered 2016-09-24: 40 meq via ORAL
  Filled 2016-09-24: qty 2

## 2016-09-24 MED ORDER — ONDANSETRON HCL 4 MG/2ML IJ SOLN: 4.0000 mg | Freq: Four times a day (QID) | INTRAMUSCULAR | Status: DC | PRN

## 2016-09-24 NOTE — ED Notes (Signed)
Report given to Paul with [facility] at this time. ETA 30 min. 

## 2016-09-24 NOTE — ED Triage Notes (Signed)
Pt states that he has been sob since 2330. He has had 2 spells but he has not gotten checked.

## 2016-09-24 NOTE — ED Provider Notes (Signed)
[facility] DEPT Provider Note   CSN: [id number] Arrival date & time: 09/24/16  8676     History   Chief Complaint Chief Complaint  Patient presents with  . Shortness of Breath    HPI Charles [name] is a 71 y.o. male.  HPI Patient presents with episodes of shortness of breath since last night at around 11:00. Has had a couple episodes like this in the past. States he felt his heart pounding. Recent admission to the hospital for strep for reasons endocarditis. Has planned for an aortic valve repair. 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 Has seen Dr. Roxy Manns and Dr. Einar Gip. Patient's primary cardiologist is Dr. Domenic Polite. He had been on antibiotics for the endocarditis but was thought to be clear. He did however 2-3 weeks ago developed a rash on his legs. His erythematous. Also some spots on his hands and feet. States he thought they were bug bites. Has had no fevers. No chest pain. States he was unable sleep last night because whenever he fell asleep he would wake up short of breath.   Past Medical History:  Diagnosis Date  . Anemia   . Aortic insufficiency    a. 07/07/2016: TEE showing bicuspid aortic valve with severe eccentric AI   . Ascending aortic aneurysm (HCC)    4.4 cm by CT 06/2016  . 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 Bicuspid aortic valve   . Chronic periodontitis   . Coronary artery disease   . History of pneumonia 1978    Patient Active Problem List   Diagnosis Date Noted  . Aortic valve disorder   . Coronary artery disease involving native coronary artery of native heart without angina pectoris   . Second degree AV block, Mobitz type I 07/08/2016  . Weight loss   . Streptococcal endocarditis   . Protein-calorie malnutrition, severe 07/03/2016  . Anemia 07/02/2016  . H/O cardiac murmur 07/02/2016  . Ascending aortic aneurysm ([place]) 07/02/2016  . NSTEMI (non-ST elevated myocardial  infarction) ([place]) 07/02/2016  . Bicuspid aortic valve 07/02/2016  . Aortic insufficiency 07/02/2016    Past Surgical History:  Procedure Laterality Date  . HERNIA REPAIR Left   . MULTIPLE EXTRACTIONS WITH ALVEOLOPLASTY N/A 08/13/2016   Procedure: Extraction of tooth #'s 2,4,14 with alveoloplasty;  Surgeon: Lenn Cal, DDS;  Location: [facility];  Service: Oral Surgery;  Laterality: N/A;  . MULTIPLE TOOTH EXTRACTIONS    . RIGHT/LEFT HEART CATH AND CORONARY ANGIOGRAPHY N/A 08/03/2016   Procedure: Right/Left Heart Cath and Coronary Angiography;  Surgeon: Burnell Blanks, MD;  Location: [facility];  Service: Cardiovascular;  Laterality: N/A;  . TEE WITHOUT CARDIOVERSION N/A 07/07/2016   Procedure: TRANSESOPHAGEAL ECHOCARDIOGRAM (TEE);  Surgeon: Larey Dresser, MD;  Location: [facility];  Service: Cardiovascular;  Laterality: N/A;       Home Medications    Prior to Admission medications   Medication Sig Start Date End Date Taking? Authorizing Provider  acetaminophen (TYLENOL) 325 MG tablet Take 650 mg by mouth every 6 (six) hours as needed for headache.    [provider]  aspirin EC 81 MG tablet Take 81 mg by mouth daily.    [provider]  chlorhexidine (PERIDEX) 0.12 % solution Rinse with 15 mls twice daily for 30 seconds. Use after breakfast and at bedtime. Spit out excess. Do not swallow. 08/13/16   Lenn Cal, DDS  Ferrous Sulfate 27 MG  TABS Take 27 mg by mouth daily.    [provider]  furosemide (LASIX) 40 MG tablet Take 1 tablet (40 mg total) by mouth daily. 07/13/16 10/11/16  Erma Heritage, PA-C  Multiple Vitamin (MULTIVITAMIN WITH MINERALS) TABS tablet Take 1 tablet by mouth daily.    [provider]  nitroGLYCERIN (NITROSTAT) 0.4 MG SL tablet Place 1 tablet (0.4 mg total) under the tongue every 5 (five) minutes as needed. 09/02/16   Satira Sark, MD    Family History Family History  Problem Relation Age of  Onset  . Ovarian cancer Mother   . Heart disease Father 78       Died of coronary thrombosis.      Social History Social History  Substance Use Topics  . Smoking status: Former Smoker    Types: Cigarettes    Quit date: 04/30/1965  . Smokeless tobacco: Never Used     Comment: Quit 30 years ago.    . Alcohol use No     Allergies   No known allergies   Review of Systems Review of Systems  Constitutional: Positive for fatigue. Negative for fever.  HENT: Negative for congestion.   Respiratory: Positive for shortness of breath.   Cardiovascular: Negative for chest pain.  Gastrointestinal: Negative for abdominal pain.  Genitourinary: Negative for flank pain.  Skin: Positive for rash.  Neurological: Negative for seizures.     Physical Exam Updated Vital Signs BP (!) 99/38   Pulse 72   Temp 97.7 F (36.5 C) (Oral)   Resp 19   Ht 6' (1.829 m)   Wt 103.9 kg (229 lb)   SpO2 97%   BMI 31.06 kg/m   Physical Exam  Constitutional: He appears well-developed.  HENT:  Head: Atraumatic.  Neck: Neck supple.  Cardiovascular: Normal rate.   Murmur heard. Systolic murmur.  Pulmonary/Chest: He has rales.  Rales bilateral bases.  Abdominal: Soft. There is no tenderness.  Musculoskeletal: He exhibits edema.  Neurological: He is alert.  Skin: Skin is warm. Capillary refill takes less than 2 seconds.  Edema to bilateral extremities. On his lower legs and feet and to lesser extent some various spots around his body has erythematous mildly raised rash. It is plaques.  Psychiatric: He has a normal mood and affect.       ED Treatments / Results  Labs (all labs ordered are listed, but only abnormal results are displayed) Labs Reviewed  BRAIN NATRIURETIC PEPTIDE - Abnormal; Notable for the following:       Result Value   B Natriuretic Peptide 253.0 (*)    All other components within normal limits  TROPONIN I - Abnormal; Notable for the following:    Troponin I 0.25 (*)     All other components within normal limits  COMPREHENSIVE METABOLIC PANEL - Abnormal; Notable for the following:    Glucose, Bld 111 (*)    BUN 23 (*)    Creatinine, Ser 1.25 (*)    Calcium 8.5 (*)    ALT 11 (*)    GFR calc non Af Amer 57 (*)    All other components within normal limits  CBC WITH DIFFERENTIAL/PLATELET - Abnormal; Notable for the following:    Hemoglobin 12.1 (*)    HCT 38.5 (*)    MCH 25.5 (*)    RDW 16.5 (*)    All other components within normal limits  CULTURE, BLOOD (ROUTINE X 2)  CULTURE, BLOOD (ROUTINE X 2)    EKG  EKG Interpretation  Date/Time:  Thursday September 24 2016 06:44:28 EDT Ventricular Rate:  79 PR Interval:    QRS Duration: 125 QT Interval:  432 QTC Calculation: 496 R Axis:   -54 Text Interpretation:  Sinus rhythm Ventricular premature complex Prolonged PR interval Probable left atrial enlargement LVH with IVCD, LAD and secondary repol abnrm Borderline prolonged QT interval Since last tracing rate faster 05 Jul 2016 Confirmed by Rolland Porter [phone]) on 09/24/2016 6:48:02 AM       Radiology Dg Chest Portable 1 View  Result Date: 09/24/2016 CLINICAL DATA:  Intermittent shortness of breath for 2 weeks. History of pneumonia. Former smoker. History of aortic insufficiency, ascending aortic aneurysm, endocarditis, coronary artery disease. EXAM: PORTABLE CHEST 1 VIEW COMPARISON:  Chest x-ray of August 07, 2016 FINDINGS: The lungs are well-expanded. The interstitial markings are diffusely increased. The pulmonary vascularity is engorged. The cardiac silhouette is enlarged. There is calcification in the wall of the aortic arch. A trace of pleural fluid is present at the left lung base. IMPRESSION: Acute CHF with interstitial edema and trace left pleural effusion. This is a marked change from the August 07, 2016 chest x-ray. Electronically Signed   By: David  Martinique M.D.   On: 09/24/2016 07:40    Procedures Procedures (including critical care  time)  Medications Ordered in ED Medications  vancomycin (VANCOCIN) IVPB 1000 mg/200 mL premix (1,000 mg Intravenous New Bag/Given 09/24/16 0905)  cefTRIAXone (ROCEPHIN) 1 g in dextrose 5 % 50 mL IVPB (0 g Intravenous Stopped 09/24/16 0902)     Initial Impression / Assessment and Plan / ED Course  I have reviewed the triage vital signs and the nursing notes.  Pertinent labs & imaging results that were available during my care of the patient were reviewed by me and considered in my medical decision making (see chart for details).     Patient resents her shortness of breath. I think this is likely worsening of his endocarditis and worsening heart failure because of it. Mildly hypotensive. Has new rash on his legs. He started with Rocephin and vancomycin due to previous cultures. Seen in the ER by cardiology and likely will be transferred to [facility] and likely get valve replacement.  Final Clinical Impressions(s) / ED Diagnoses   Final diagnoses:  Bacterial endocarditis, unspecified chronicity  Heart failure, unspecified HF chronicity, unspecified heart failure type [facility])    New Prescriptions New Prescriptions   No medications on file     Davonna Belling, MD 09/24/16 [phone]

## 2016-09-24 NOTE — ED Notes (Signed)
CRITICAL VALUE ALERT  Critical Value:  Trop 0.25  Date & Time Notied:  09/24/16, 3014  Provider Notified: Dr. Alvino Chapel  Orders Received/Actions taken: no new orders received at this time.

## 2016-09-24 NOTE — Consult Note (Addendum)
Cardiology Consultation:   Patient ID: [name]; [id number]; 01/31/1946   Admit date: 09/24/2016 Date of Consult: 09/24/2016  Primary Care Provider: [name], MD Primary Cardiologist: Charles Polite MD Primary Electrophysiologist:  NA   Patient Profile:   Charles [name] is a 71 y.o. male with a hx of subacute bacterial endocarditis, severe non-rheumatic AoV regurgitation being considered for AoV replacement/ repair, bicuspid AoV, CAD with 70% RCA disease, and mild non-obstructive disease elsewhere,  who is being seen today for the evaluation of dyspnea, CHF, pericarditis,  at the request of Charles [name], [facility] ER.   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 He was also found to have a 4.4 cm aortic aneurysm.   Has been seen by Charles [name] for discussion of aortic valve surgery, and coronary artery bypass graft on 08/17/2016. Charles [name] was concerned that the patient's aortic insufficiency has been made considerably worse by endocarditis. He wishes to proceed with surgical repair sooner than later however the patient was without complaint and wished to defer surgery with watchful waiting. Was last seen in our office on 09/02/2016 by Dr. Domenic [name], at that time he wished to wait to proceed with surgery as he was feeling well and was asymptomatic.  He saw Dr. Einar [name] on 09/22/2016 for a second opinion, was taken off of Lasix 40 mg daily, started on losartan 50/12.5 mg and rosuvastatin. The patient was told to take Lasix when necessary for edema and weight gain. Yesterday, he noticed lower extremity edema, worsening throughout the day. He denied any weight gain. The patient also states that when he went to bed last  on about 11:30 he was unable to breathe, he was having symptoms of orthopnea. The patient states that it worsened he became concerned and came to the ER.  He presented to the emergency room at [facility] with complaints as severe shortness of breath. On arrival blood pressure was 125/56, heart rate 94, O2 sat 96%, he was afebrile. Blood cultures were drawn in ER. Troponin 0.25. Other pertinent labs creatinine 1.25 potassium 3.6, hemoglobin 12.1, hematocrit 38.5, no evidence of leukocytosis. Chest x-ray revealed acute CHF with interstitial edema and trace left pleural effusion.  Of note, the patient was helping friends over the weekend in the woods, received numerous chigger bites on his legs bilaterally, several whelps and mild blistering is noted which worsened over the last 24 hours.   Past Medical History:  Diagnosis Date  . Anemia   . Aortic insufficiency    a. 07/07/2016: TEE showing bicuspid aortic valve with severe eccentric AI   . Ascending aortic aneurysm (HCC)    4.4 cm by CT 06/2016  . 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 Bicuspid aortic valve   . Chronic periodontitis   . Coronary artery disease   . History of pneumonia 1978    Past Surgical History:  Procedure Laterality Date  . HERNIA REPAIR Left   . MULTIPLE EXTRACTIONS WITH ALVEOLOPLASTY N/A 08/13/2016   Procedure: Extraction of tooth #'s 2,4,14 with alveoloplasty;  Surgeon: Charles [name], DDS;  Location: [facility];  Service: Oral Surgery;  Laterality: N/A;  . MULTIPLE TOOTH EXTRACTIONS    . RIGHT/LEFT HEART CATH AND CORONARY ANGIOGRAPHY N/A 08/03/2016   Procedure: Right/Left  Heart Cath and Coronary Angiography;  Surgeon: Charles Blanks, MD;  Location: [facility] CV LAB;  Service: Cardiovascular;  Laterality: N/A;  . TEE WITHOUT CARDIOVERSION N/A 07/07/2016   Procedure: TRANSESOPHAGEAL ECHOCARDIOGRAM (TEE);  Surgeon: Charles Dresser, MD;  Location: [facility] ENDOSCOPY;  Service:  Cardiovascular;  Laterality: N/A;     Inpatient Medications: Scheduled Meds:  Continuous Infusions: . vancomycin 1,000 mg (09/24/16 0905)   PRN Meds:   Allergies:    Allergies  Allergen Reactions  . No Known Allergies     Social History:   Social History   Social History  . Marital status: Married    Spouse name: N/A  . Number of children: 3  . Years of education: N/A   Occupational History  . Not on file.   Social History Main Topics  . Smoking status: Former Smoker    Types: Cigarettes    Quit date: 04/30/1965  . Smokeless tobacco: Never Used     Comment: Quit 30 years ago.    . Alcohol use No  . Drug use: No  . Sexual activity: Not on file   Other Topics Concern  . Not on file   Social History Narrative  . No narrative on file    Family History:   The patient's family history includes Heart disease (age of onset: 52) in his father; Ovarian cancer in his mother.  ROS:  Please see the history of present illness.  ROS    Physical Exam/Data:   Vitals:   09/24/16 0749 09/24/16 0800 09/24/16 0830 09/24/16 0845  BP: (!) 107/38 (!) 105/36 (!) 95/40 (!) 99/38  Pulse: 77 77 71 72  Resp: 17 (!) 24 (!) 22 19  Temp:      TempSrc:      SpO2: 96% 94% 97% 97%  Weight:      Height:        Intake/Output Summary (Last 24 hours) at 09/24/16 0908 Last data filed at 09/24/16 0902  Gross per 24 hour  Intake               50 ml  Output                0 ml  Net               50 ml   Filed Weights   09/24/16 0648  Weight: 229 lb (103.9 kg)   Body mass index is 31.06 kg/m.  General:  Well nourished, well developed, in no acute distress Unable to lie supine, wearing oxygen. HEENT: normal Lymph: no adenopathy Neck: Positive JVD at 90 Endocrine:  No thryomegaly Vascular: No carotid bruits; FA pulses 2+ bilaterally without bruits  Cardiac:  Regular rate and rhythm with 2/6 to 3/6 holosystolic murmur, soft diastolic murmur, no rubs. Lungs: Diminished breath  sounds bibasilar, absent in the right lower lobe, no wheezes or rhonchi, some crackles on inspiration. Abd: soft, nontender, no hepatomegaly  Ext: 2+ edema in the feet and ankles, 1+ pretibially. Numerous chigger bites along his legs with some blistering noted bilaterally Musculoskeletal:  No deformities, BUE and BLE strength normal and equal Skin: warm and dry  Neuro:  CNs 2-12 intact, no focal abnormalities noted Psych:  Normal affect    EKG:  The EKG was personally reviewed and demonstrates sinus rhythm rate of 94 bpm, PVCs, LVH.  Relevant CV Studies: 1. Cardiac Cath 08/03/2016 Conclusion     Ost RCA lesion, 70 %stenosed.  Prox Cx to Mid  Cx lesion, 30 %stenosed.  Mid LAD lesion, 20 %stenosed.  Ost 2nd Diag lesion, 20 %stenosed.  Dist LAD lesion, 40 %stenosed.  LV end diastolic pressure is mildly elevated.   1. Severe stenosis ostium of the large dominant RCA. There is dampening of the pressure with catheter engagement of this vessel suggesting the stenosis is severe.  2. Mild disease in the Circumflex and LAD 3. Mildly elevated LV filling pressure 4. Dilated ascending aorta.   Recommendation: He will continue planning for AVR. He will need AVR and bypass of the RCA with special attention paid to his ascending aorta. He has follow up in 2 weeks with Charles [name].    TEE 07/07/2016 Left ventricle: The cavity size was moderately dilated. Wall   thickness was normal. Systolic function was normal. The estimated   ejection fraction was in the range of 60% to 65%. Wall motion was   normal; there were no regional wall motion abnormalities. - Aortic valve: The aortic valve was bicuspid. There was eccentric   aortic insufficiency that appeared severe. There was no aortic   stenosis. No definite vegetation seen. There was a small area of   thickening with possible potential space at the valve base   adjacent to the RV. The AI appeared to originate from this area.   However, I am not  convinced this is a vegetation or abscess. - Aorta: The aortic root was dilated to to 4.7 cm with a 4.4 cm   ascending aorta. Diastolic flow reversal noted in the ascending   thoracic aorta. - Mitral valve: No evidence of vegetation. There was mild   regurgitation. - Left atrium: No evidence of thrombus in the atrial cavity or   appendage. - Right ventricle: The cavity size was normal. Systolic function   was normal. - Right atrium: No evidence of thrombus in the atrial cavity or   appendage. - Atrial septum: No defect or patent foramen ovale was identified. - Tricuspid valve: No evidence of vegetation. - Pulmonic valve: No evidence of vegetation.  Transthoracic Echocardiogram 07/03/2016 Study Conclusions  - Left ventricle: The cavity size was mildly dilated. Wall   thickness was increased in a pattern of mild LVH. Systolic   function was normal. The estimated ejection fraction was 55%.   Probable hypokinesis of the apicalinferior myocardium. Doppler   parameters are consistent with abnormal left ventricular   relaxation (grade 1 diastolic dysfunction). Doppler parameters   are consistent with high ventricular filling pressure. - Aortic valve: Bicuspid; moderately thickened leaflets.   Intermittently noted small echodensities seen near leaflet tips   in the parasternal views, although not well in other views.   Suspicious for vegetations particularly in light of bacteremia   although TEE is indicated for further assessment, and also to   exclude any associated abscess in light of fairly peripheral   aortic regurgitation. There was moderate regurgitation. Mean   gradient (S): 5 mm Hg. Peak gradient (S): 8 mm Hg. Regurgitation   pressure half-time: 383 ms. - Aorta: Aortic root dimension: 50 mm (ED). - Aortic root: The aortic root was moderately dilated. - Mitral valve: Calcified annulus. There was mild regurgitation. - Right atrium: Echodensity noted near the septum and  tricuspid   valve plane, best seen in the apical views but not in the   subcostal views. Suspect artifact or perhaps visualization of   compressed free wall, although in light of bacteremia would   suggest further imaging with TEE to exclude other pathology.  Central venous pressure (est): 8 mm Hg. - Tricuspid valve: There was trivial regurgitation. - Pulmonary arteries: PA peak pressure: 28 mm Hg (S). - Pericardium, extracardiac: There was no pericardial effusion.  Impressions:  - Mildly dilated left ventricle with mild LVH and LVEF   approximately 55%. Probable hypokinesis of the apical inferior   wall. Grade 1 diastolic dysfunction with increased filling   pressures. Calcified mitral annulus with mild mitral   regurgitation. Aortic valve is bicuspid and moderately thickened.   Intermittently noted small echodensities seen near leaflet tips   in the parasternal views, although not well in other views.   Suspicious for vegetations particularly in light of bacteremia   although TEE is indicated for further assessment, and also to   exclude any associated abscess in light of fairly peripheral   aortic regurgitation. Aortic root is moderately dilated, measures   up to 50 mm. Trivial tricuspid regurgitation with PASP estimated   28 mmHg. Echodensity noted in right atrium near the septum and   tricuspid valve plane, best seen in the apical views but not in   the subcostal views. Suspect artifact or perhaps visualization of   compressed free wall, although in light of bacteremia would   suggest further imaging with TEE to exclude other pathology.   Results discussed with Dr. Jerilee Hoh.   Laboratory Data:  Chemistry Recent Labs Lab 09/24/16 0715  NA 138  K 3.6  CL 105  CO2 24  GLUCOSE 111*  BUN 23*  CREATININE 1.25*  CALCIUM 8.5*  GFRNONAA 57*  GFRAA >60  ANIONGAP 9     Recent Labs Lab 09/24/16 0715  PROT 7.1  ALBUMIN 3.5  AST 18  ALT 11*  ALKPHOS 108  BILITOT  0.5   Hematology Recent Labs Lab 09/24/16 0715  WBC 10.2  RBC 4.75  HGB 12.1*  HCT 38.5*  MCV 81.1  MCH 25.5*  MCHC 31.4  RDW 16.5*  PLT 219   Cardiac Enzymes Recent Labs Lab 09/24/16 0715  TROPONINI 0.25*   No results for input(s): TROPIPOC in the last 168 hours.  BNP Recent Labs Lab 09/24/16 0715  BNP 253.0*    DDimer No results for input(s): DDIMER in the last 168 hours.  Radiology/Studies:  Dg Chest Portable 1 View  Result Date: 09/24/2016 CLINICAL DATA:  Intermittent shortness of breath for 2 weeks. History of pneumonia. Former smoker. History of aortic insufficiency, ascending aortic aneurysm, endocarditis, coronary artery disease. EXAM: PORTABLE CHEST 1 VIEW COMPARISON:  Chest x-ray of August 07, 2016 FINDINGS: The lungs are well-expanded. The interstitial markings are diffusely increased. The pulmonary vascularity is engorged. The cardiac silhouette is enlarged. There is calcification in the wall of the aortic arch. A trace of pleural fluid is present at the left lung base. IMPRESSION: Acute CHF with interstitial edema and trace left pleural effusion. This is a marked change from the August 07, 2016 chest x-ray. Electronically Signed   By: David  Martinique M.D.   On: 09/24/2016 07:40    Assessment and Plan:   1. Acute CHF: Noted on chest x-ray with pleural effusion in the left base with interstitial edema. The patient is not been treated with IV Lasix at this time. Blood pressure is soft. Would recommend giving one dose of IV Lasix to assist with CHF. Most recent echocardiogram revealed normal LV systolic function.  2. Severe non-rheumatic aortic valve regurgitation, with bicuspid aortic valve: Patient is willing to transfer to [facility] to have aortic valve repair, as  his symptoms have worsened with shortness of breath and fatigue. The patient is on ARB and HCTZ. Would recommend removing HCTZ and continue ARB therapy on transfer with medications to be adjusted  perioperatively. He has been seen by Charles [name], discussion about proceeding with surgery risks and benefits had artery been explained. He is now willing to proceed.  3. AAA: 4.4 cm. consideration for repair during aortic valve surgery per Charles [name].  4. Coronary artery disease: Most recent cardiac catheterization right and left on 08/03/2016 revealed 70% ostial RCA stenosis, with nonobstructive disease elsewhere. Coronary artery bypass grafting timing per cardiovascular surgery once transferred to [facility].Troponin elevated at 0.25. Continue to cycle enzymes  5. Bilateral chigger bites in the lower extremities: Antibiotic therapy is being prescribed by ER physician.  6. History of bacterial endocarditis: Patient is no longer on antibiotic therapy at home.  Patient will be seen and examined by Dr. Bronson Ing. We'll plan transfer to [facility] after his examination and recommendations.   Signed, Jory Sims, NP  09/24/2016 9:08 AM   The patient was seen and examined, and I agree with the history, physical exam, assessment and plan as documented above, with modifications as noted below. I have also personally reviewed all relevant documentation, old records, labs, and both radiographic and cardiovascular studies. I have also independently interpreted old and new ECG's.  The patient is a 71 year old male with a complex cardiovascular history as noted above. He and his wife are originally from [place], [place] but had been living in [place], [place] but moved to [place], [place] to be with their son. His wife taught [place] and he taught Careers information officer at [employer].  He has a bicuspid aortic valve with severe aortic insufficiency, moderate aneurysmal enlargement of the ascending thoracic aorta, and severe RCA disease. He recently completed therapy with antibiotics for Streptococcus viridans subacute bacterial endocarditis and was deciding on the timing of surgery. There was  concern for aortic valve leaflet perforation. He saw both Charles [name] and Dr. Domenic [name] in late May. He saw Dr. Megan Salon from infectious disease and was doing well at that time.  He has had episodic shortness of breath and palpitations over the past few days but then became acutely short of breath at about 11:30 PM last night. He has been unable to sleep. He now describes frequent palpitations but denies chest pain.  He wore a cardiac monitor earlier this year which did not demonstrate any significant arrhythmias with rare atrial ectopy.  Pertinent labs: BNP 253, troponin 0.25, creatinine 1.25.  Chest x-ray reveals acute CHF with interstitial edema and trace left pleural effusion.  He is afebrile. He is hypotensive. He is mentating appropriately.  Physical exam notable for harsh 4/4 holodiastolic murmur heard throughout the precordium but loudest at left lower sternal border. He has multiple erythematous lesions on both lower extremities. There are no Janeway lesions on palms or soles. Lungs have bilateral crackles.  Telemetry shows sinus rhythm.  I personally reviewed the ECG which demonstrated normal sinus rhythm with LVH and consequent repolarization abnormalities.  With regards to management, I will provide a single dose of 40 mg IV Lasix to alleviate pulmonary edema. Blood pressures will need to be carefully monitored.  He received antibiotics from the ED physician. Troponins are mildly elevated and will continue to be cycled. Continue aspirin and statin.  He has developed severe symptomatic aortic regurgitation. I feel he needs surgery. I will have him transferred to [facility] so he can be evaluated  by Charles [name] with regards to timing of surgery which would involve aortic valve repair versus replacement, repair of ascending aortic aneurysm, and single vessel CABG to the RCA.  The patient and his wife are agreeable with this plan.  Kate Sable, MD, [facility]  Time spent: 75  minutes  09/24/2016 10:45 AM

## 2016-09-24 NOTE — ED Notes (Signed)
CRITICAL VALUE ALERT  Critical Value:  Trop 0.85  Date & Time Notied:  09/24/16, 1122  Provider Notified: Dr. Alvino Chapel  Orders Received/Actions taken: No new orders at this time

## 2016-09-25 DIAGNOSIS — I5031 Acute diastolic (congestive) heart failure: Secondary | ICD-10-CM | POA: Diagnosis present

## 2016-09-25 LAB — CBC
HCT: 39.1 % (ref 39.0–52.0)
HEMOGLOBIN: 12.2 g/dL — AB (ref 13.0–17.0)
MCH: 25.3 pg — AB (ref 26.0–34.0)
MCHC: 31.2 g/dL (ref 30.0–36.0)
MCV: 81 fL (ref 78.0–100.0)
Platelets: 222 10*3/uL (ref 150–400)
RBC: 4.83 MIL/uL (ref 4.22–5.81)
RDW: 16.5 % — ABNORMAL HIGH (ref 11.5–15.5)
WBC: 11.2 10*3/uL — ABNORMAL HIGH (ref 4.0–10.5)

## 2016-09-25 LAB — BASIC METABOLIC PANEL
ANION GAP: 11 (ref 5–15)
BUN: 27 mg/dL — ABNORMAL HIGH (ref 6–20)
CHLORIDE: 104 mmol/L (ref 101–111)
CO2: 22 mmol/L (ref 22–32)
Calcium: 8.5 mg/dL — ABNORMAL LOW (ref 8.9–10.3)
Creatinine, Ser: 1.34 mg/dL — ABNORMAL HIGH (ref 0.61–1.24)
GFR calc non Af Amer: 52 mL/min — ABNORMAL LOW (ref >60)
GLUCOSE: 116 mg/dL — AB (ref 65–99)
Potassium: 3.9 mmol/L (ref 3.5–5.1)
Sodium: 137 mmol/L (ref 135–145)

## 2016-09-25 LAB — HEPARIN LEVEL (UNFRACTIONATED): Heparin Unfractionated: 0.21 IU/mL — ABNORMAL LOW (ref 0.30–0.70)

## 2016-09-25 MED ORDER — FUROSEMIDE 10 MG/ML IJ SOLN
80.0000 mg | Freq: Two times a day (BID) | INTRAMUSCULAR | Status: DC
  Administered 2016-09-25 – 2016-09-30 (×11): 80 mg via INTRAVENOUS
  Filled 2016-09-25 (×11): qty 8

## 2016-09-25 MED ORDER — POTASSIUM CHLORIDE CRYS ER 20 MEQ PO TBCR
20.0000 meq | EXTENDED_RELEASE_TABLET | Freq: Every day | ORAL | Status: DC
  Administered 2016-09-25 – 2016-09-28 (×4): 20 meq via ORAL
  Filled 2016-09-25 (×2): qty 1
  Filled 2016-09-25: qty 2
  Filled 2016-09-25: qty 1

## 2016-09-25 MED ORDER — FUROSEMIDE 10 MG/ML IJ SOLN: 80.0000 mg | Freq: Two times a day (BID) | INTRAMUSCULAR | Status: DC

## 2016-09-25 MED ORDER — SACCHAROMYCES BOULARDII 250 MG PO CAPS
250.0000 mg | ORAL_CAPSULE | Freq: Two times a day (BID) | ORAL | Status: DC
  Administered 2016-09-25 – 2016-10-01 (×14): 250 mg via ORAL
  Filled 2016-09-25 (×14): qty 1

## 2016-09-25 MED ORDER — FUROSEMIDE 10 MG/ML IJ SOLN: 60.0000 mg | Freq: Two times a day (BID) | INTRAMUSCULAR | Status: DC

## 2016-09-25 MED ORDER — HEPARIN BOLUS VIA INFUSION
2000.0000 [IU] | Freq: Once | INTRAVENOUS | Status: AC
  Administered 2016-09-25: 2000 [IU] via INTRAVENOUS
  Filled 2016-09-25: qty 2000

## 2016-09-25 MED ORDER — HEPARIN (PORCINE) IN NACL 100-0.45 UNIT/ML-% IJ SOLN
1800.0000 [IU]/h | INTRAMUSCULAR | Status: AC
  Administered 2016-09-25: 1200 [IU]/h via INTRAVENOUS
  Administered 2016-09-26: 1800 [IU]/h via INTRAVENOUS
  Administered 2016-09-26: 1500 [IU]/h via INTRAVENOUS
  Administered 2016-09-27 – 2016-09-30 (×3): 1800 [IU]/h via INTRAVENOUS
  Filled 2016-09-25 (×11): qty 250

## 2016-09-25 MED ORDER — LOSARTAN POTASSIUM 50 MG PO TABS: 50.0000 mg | ORAL_TABLET | Freq: Every day | ORAL | Status: DC

## 2016-09-25 MED ORDER — SODIUM CHLORIDE 0.9 % IV SOLN
1.5000 g | Freq: Four times a day (QID) | INTRAVENOUS | Status: DC
  Administered 2016-09-25 – 2016-09-28 (×12): 1.5 g via INTRAVENOUS
  Filled 2016-09-25 (×15): qty 1.5

## 2016-09-25 NOTE — Progress Notes (Signed)
Nutrition Brief Note  Patient identified on the Malnutrition Screening Tool (MST) Report. Patient lost weight earlier this year, but weight has stabilized over the past 3 months. Nutrition focused physical exam completed.  No muscle or subcutaneous fat depletion noticed.  Wt Readings from Last 10 Encounters:  09/25/16 230 lb 1.6 oz (104.4 kg)  09/08/16 233 lb (105.7 kg)  09/02/16 231 lb (104.8 kg)  08/17/16 228 lb (103.4 kg)  08/07/16 228 lb (103.4 kg)  08/03/16 227 lb (103 kg)  07/29/16 229 lb (103.9 kg)  07/28/16 231 lb (104.8 kg)  07/23/16 227 lb (103 kg)  07/13/16 237 lb (107.5 kg)    Body mass index is 31.21 kg/m. Patient meets criteria for obesity based on current BMI.   Current diet order is heart healthy, patient is consuming approximately 50-75% of meals at this time. Labs and medications reviewed.   No nutrition interventions warranted at this time. If nutrition issues arise, please consult RD.   Molli Barrows, RD, LDN, [place] Pager [phone] After Hours Pager [phone]

## 2016-09-25 NOTE — Progress Notes (Signed)
[facility] NOTE  Pharmacy Consult for Heparin Indication: ACS   Allergies  Allergen Reactions  . No Known Allergies     Patient Measurements: Height: 6' (182.9 cm) Weight: 230 lb 1.6 oz (104.4 kg) IBW/kg (Calculated) : 77.6 Adjusted Body Weight: 99 kg  Vital Signs: Temp: 98.1 F (36.7 C) (06/15 1613) Temp Source: Oral (06/15 1613) BP: 119/39 (06/15 1759) Pulse Rate: 70 (06/15 1613) Intake/Output from previous day: 06/14 0701 - 06/15 0700 In: 610 [P.O.:360; IV Piggyback:250] Out: 1000 [Urine:1000] Intake/Output from this shift: Total I/O In: 480 [P.O.:480] Out: -   Labs:  Recent Labs  09/24/16 0715 09/24/16 1834 09/25/16 0412  WBC 10.2  --  11.2*  HGB 12.1*  --  12.2*  HCT 38.5*  --  39.1  PLT 219  --  222  CREATININE 1.25* 1.47* 1.34*  ALBUMIN 3.5  --   --   PROT 7.1  --   --   AST 18  --   --   ALT 11*  --   --   ALKPHOS 108  --   --   BILITOT 0.5  --   --    Estimated Creatinine Clearance: 65 mL/min (A) (by C-G formula based on SCr of 1.34 mg/dL (H)).    Assessment: 6 YOM with recent hx of viridins strep bacteremia aortic valve endocarditis, and completed a 4 wk course of rocephin on April 18th. Pt is transferred from [facility] to [facility] for CHF and r/o pericarditis. Pt's troponin has been elevated, pharmacy is consulted to start IV heparin. Pt received lovenox 40 mg yesterday evening at 1800  Initial heparin level = 0.21  Goal of Therapy:  Heparin level 0.3-0.7   Plan:  Increase heparin to 1500 units / hr Follow up AM labs  Thank you Anette Guarneri, PharmD [phone]  09/25/2016,6:41 PM

## 2016-09-25 NOTE — Progress Notes (Signed)
MEDICATION RELATED CONSULT NOTE - INITIAL   Pharmacy Consult for Heparin / unasyn Indication: ACS / insects bite infection  Allergies  Allergen Reactions  . No Known Allergies     Patient Measurements: Height: 6' (182.9 cm) Weight: 230 lb 1.6 oz (104.4 kg) IBW/kg (Calculated) : 77.6 Adjusted Body Weight: 99 kg  Vital Signs: Temp: 97.7 F (36.5 C) (06/15 0739) Temp Source: Oral (06/15 0739) BP: 112/53 (06/15 0739) Pulse Rate: 79 (06/15 0739) Intake/Output from previous day: 06/14 0701 - 06/15 0700 In: 610 [P.O.:360; IV Piggyback:250] Out: 1000 [Urine:1000] Intake/Output from this shift: No intake/output data recorded.  Labs:  Recent Labs  09/24/16 0715 09/24/16 1834 09/25/16 0412  WBC 10.2  --  11.2*  HGB 12.1*  --  12.2*  HCT 38.5*  --  39.1  PLT 219  --  222  CREATININE 1.25* 1.47* 1.34*  ALBUMIN 3.5  --   --   PROT 7.1  --   --   AST 18  --   --   ALT 11*  --   --   ALKPHOS 108  --   --   BILITOT 0.5  --   --    Estimated Creatinine Clearance: 65 mL/min (A) (by C-G formula based on SCr of 1.34 mg/dL (H)).   Microbiology: Recent Results (from the past 720 hour(s))  Culture, blood (routine x 2)     Status: None (Preliminary result)   Collection Time: 09/24/16  7:37 AM  Result Value Ref Range Status   Specimen Description LEFT ANTECUBITAL  Final   Special Requests   Final    BOTTLES DRAWN AEROBIC AND ANAEROBIC Blood Culture adequate volume   Culture PENDING  Incomplete   Report Status PENDING  Incomplete  Culture, blood (routine x 2)     Status: None (Preliminary result)   Collection Time: 09/24/16  7:53 AM  Result Value Ref Range Status   Specimen Description BLOOD RIGHT HAND  Final   Special Requests   Final    BOTTLES DRAWN AEROBIC AND ANAEROBIC Blood Culture adequate volume   Culture PENDING  Incomplete   Report Status PENDING  Incomplete    Medical History: Past Medical History:  Diagnosis Date  . Anemia   . Aortic insufficiency    a.  07/07/2016: TEE showing bicuspid aortic valve with severe eccentric AI   . Ascending aortic aneurysm (HCC)    4.4 cm by CT 06/2016  . 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 Bicuspid aortic valve   . Chronic periodontitis   . Coronary artery disease   . Pneumonia 1978    Assessment: 71 YOM with recent hx of viridins strep bacteremia aortic valve endocarditis, and completed a 4 wk course of rocephin on April 18th. Pt is [facility] from [facility] to [facility] for CHF and r/o pericarditis. Pt's troponin has been elevated, pharmacy is consulted to start IV heparin. Pt received lovenox 40 mg yesterday evening at 1800  Pt with bilateral chigger bites in the lower extremities, Pharmacy is also consulted to start IV abx prophylaxis with Unasyn d/t high risk for bacteremia (recent hx, pending valve surgery)  Goal of Therapy:  Heparin level 0.3-0.7   Plan:  Heparin bolus 2000 units (smaller bolus d/t lovenox last night) Heparin infusion 1200 units/hr F/u 6 hr heparin level at 1800 F/u plan for surgery/cath? Unasyn 1.5 g IV Q 6 hrs F/u surgery plan and abx stop date  Maryanna Shape, PharmD, BCPS  Clinical Pharmacist  Pager: [phone]   09/25/2016,10:20 AM

## 2016-09-25 NOTE — Progress Notes (Signed)
Per [facility] Consult visited with patient to provide prayer and emotional support. Patient indicated that things had not gotten worst but he was a SOB and when he talks it makes him SOB as well.. Otherwise he is ok and is coping well.  Prayed for patient. Chaplain will follow as needed. Jaclynn Major, [place], [facility], Pager [phone]

## 2016-09-25 NOTE — Care Management Note (Signed)
Case Management Note  Patient Details  Name: Charles [name] MRN: [id number] Date of Birth: [birth year]  Subjective/Objective:  From home with spouse, presents with hx SBE, severe non  Rheumatic AOV regurgitation, being considered for AOV replacement/repair, he sob with minimal activity.   PCP Allyn Kenner                    Action/Plan: NCM will follow for dc needs.  Expected Discharge Date:                  Expected Discharge Plan:     In-House Referral:     Discharge planning Services  CM Consult  Post Acute Care Choice:    Choice offered to:     DME Arranged:    DME Agency:     HH Arranged:    HH Agency:     Status of Service:  In process, will continue to follow  If discussed at Long Length of Stay Meetings, dates discussed:    Additional Comments:  Zenon Mayo, RN 09/25/2016, 5:40 PM

## 2016-09-25 NOTE — Progress Notes (Signed)
Progress Note  Patient Name: Charles [name] Date of Encounter: 09/25/2016  Primary Cardiologist: Dr Domenic Polite  Patient Profile     71 y.o. 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   Subjective   Still very SOB with minimal activity, cannot go to the bathroom without SOB. Feels the Lasix he was given did not do much. (40 mg IV x 2 doses)  Inpatient Medications    Scheduled Meds: . aspirin  324 mg Oral NOW   Or  . aspirin  300 mg Rectal NOW  . aspirin EC  81 mg Oral Daily  . atorvastatin  80 mg Oral q1800  . chlorhexidine  15 mL Mouth/Throat QID  . enoxaparin (LOVENOX) injection  40 mg Subcutaneous Q24H  . feeding supplement (ENSURE ENLIVE)  237 mL Oral BID BM  . ferrous sulfate  325 mg Oral Q breakfast  . losartan  50 mg Oral Daily   Continuous Infusions:  PRN Meds: acetaminophen, nitroGLYCERIN, ondansetron (ZOFRAN) IV   Vital Signs    Vitals:   09/24/16 1940 09/24/16 2355 09/25/16 0333 09/25/16 0739  BP: (!) 114/44 (!) 81/67 (!) 124/40 (!) 112/53  Pulse: 83 85 89 79  Resp:      Temp: 98 F (36.7 C) 97.5 F (36.4 C) 98.1 F (36.7 C) 97.7 F (36.5 C)  TempSrc: Oral Oral Oral Oral  SpO2: 96% 98% 90% 94%  Weight:   230 lb 1.6 oz (104.4 kg)   Height:        Intake/Output Summary (Last 24 hours) at 09/25/16 0901 Last data filed at 09/25/16 3382  Gross per 24 hour  Intake              610 ml  Output             1000 ml  Net             -390 ml   Filed Weights   09/24/16 0648 09/25/16 0333  Weight: 229 lb (103.9 kg) 230 lb 1.6 oz (104.4 kg)    Telemetry    SR, ST, PVCs - Personally Reviewed  ECG    N./a - Personally Reviewed  Physical Exam   General: Well developed, well nourished, male appearing in no acute  distress. Head: Normocephalic, atraumatic.  Neck: Supple without bruits, JVD 10 cm. Lungs:  Resp regular and unlabored, decreased BS bases w/ bibasilar rales. Heart: RRR, S1, S2, no S3, S4, 3/6 diastolic murmur; no rub. Abdomen: Soft, non-tender, non-distended with normoactive bowel sounds. No hepatomegaly. No rebound/guarding. No obvious abdominal masses. Extremities: No clubbing, cyanosis, no edema. Distal pedal pulses are 2+ bilaterally. Neuro: Alert and oriented X 3. Moves all extremities spontaneously. Psych: Normal affect.  Labs    Hematology Recent Labs Lab 09/24/16 0715 09/25/16 0412  WBC 10.2 11.2*  RBC 4.75 4.83  HGB 12.1* 12.2*  HCT 38.5* 39.1  MCV 81.1 81.0  MCH 25.5* 25.3*  MCHC 31.4 31.2  RDW 16.5* 16.5*  PLT 219 222    Chemistry Recent Labs Lab 09/24/16 0715 09/24/16 1834 09/25/16 0412  NA 138 135 137  K 3.6 4.6 3.9  CL 105 105 104  CO2 24 20* 22  GLUCOSE 111* 138* 116*  BUN 23* 25* 27*  CREATININE 1.25* 1.47* 1.34*  CALCIUM 8.5*  8.3* 8.5*  PROT 7.1  --   --   ALBUMIN 3.5  --   --   AST 18  --   --   ALT 11*  --   --   ALKPHOS 108  --   --   BILITOT 0.5  --   --   GFRNONAA 57* 47* 52*  GFRAA >60 54* >60  ANIONGAP 9 10 11      Cardiac Enzymes Recent Labs Lab 09/24/16 0715 09/24/16 1036  TROPONINI 0.25* 0.85*   No results for input(s): TROPIPOC in the last 168 hours.   BNP Recent Labs Lab 09/24/16 0715  BNP 253.0*     Radiology    Dg Chest Portable 1 View Result Date: 09/24/2016 CLINICAL DATA:  Intermittent shortness of breath for 2 weeks. History of pneumonia. Former smoker. History of aortic insufficiency, ascending aortic aneurysm, endocarditis, coronary artery disease. EXAM: PORTABLE CHEST 1 VIEW COMPARISON:  Chest x-ray of August 07, 2016 FINDINGS: The lungs are well-expanded. The interstitial markings are diffusely increased. The pulmonary vascularity is engorged. The cardiac silhouette is enlarged. There is calcification in the  wall of the aortic arch. A trace of pleural fluid is present at the left lung base. IMPRESSION: Acute CHF with interstitial edema and trace left pleural effusion. This is a marked change from the August 07, 2016 chest x-ray. Electronically Signed   By: David  Martinique M.D.   On: 09/24/2016 07:40     Cardiac Studies   TEE ECHO: 07/07/2016 - Left ventricle: The cavity size was moderately dilated. Wall   thickness was normal. Systolic function was normal. The estimated   ejection fraction was in the range of 60% to 65%. Wall motion was   normal; there were no regional wall motion abnormalities. - Aortic valve: The aortic valve was bicuspid. There was eccentric   aortic insufficiency that appeared severe. There was no aortic   stenosis. No definite vegetation seen. There was a small area of   thickening with possible potential space at the valve base   adjacent to the RV. The AI appeared to originate from this area.   However, I am not convinced this is a vegetation or abscess. - Aorta: The aortic root was dilated to to 4.7 cm with a 4.4 cm   ascending aorta. Diastolic flow reversal noted in the ascending   thoracic aorta. - Mitral valve: No evidence of vegetation. There was mild   regurgitation. - Left atrium: No evidence of thrombus in the atrial cavity or   appendage. - Right ventricle: The cavity size was normal. Systolic function   was normal. - Right atrium: No evidence of thrombus in the atrial cavity or   appendage. - Atrial septum: No defect or patent foramen ovale was identified. - Tricuspid valve: No evidence of vegetation. - Pulmonic valve: No evidence of vegetation. Impressions: - 1. Moderately dilated LV with normal systolic function.   2. Bicuspid aortic valve with severe eccentric aortic   insufficiency. No definite infection seen.   3. Moderately dilated aortic root and ascending aorta.   CATH: 08/03/2016  Ost RCA lesion, 70 %stenosed.  Prox Cx to Mid Cx lesion,  30 %stenosed.  Mid LAD lesion, 20 %stenosed.  Ost 2nd Diag lesion, 20 %stenosed.  Dist LAD lesion, 40 %stenosed.  LV end diastolic pressure is mildly elevated.  1. Severe stenosis ostium of the large dominant RCA. There is dampening of the pressure with catheter engagement of this vessel suggesting the stenosis is severe.  2. Mild disease in the Circumflex and LAD 3. Mildly elevated LV filling pressure 4. Dilated ascending aorta.  Recommendation: He will continue planning for AVR. He will need AVR and bypass of the RCA with special attention paid to his ascending aorta. He has follow up in 2 weeks with Dr. Roxy Manns.    Patient Profile     71 y.o. male w/ hx SBE, severe non-rheumatic AoV regurgitation being considered for AoV replacement/ repair, bicuspid AoV, CAD with 70% RCA disease, and mild non-obstructive disease elsewhere, 4.4 cm Ascending Ao aneurysm, [facility] for CHF, r/o pericarditis as well.   Assessment & Plan    1. Acute Diastolic CHF: Noted on chest x-ray with pleural effusion in the left base with interstitial edema. Got IV Lasix but BP is soft.  - increase Lasix to 60 mg and give BID, hold Cozaar for now w/ soft BP and abnormal renal function - follow I/O, daily weights, BMET   2. Severe non-rheumatic aortic valve regurgitation, with bicuspid aortic valve: Patient is willing to have aortic valve repair, as his symptoms have worsened with shortness of breath and fatigue. He has been seen by Dr. Roxy Manns, discussion about proceeding with surgery risks and benefits had artery been explained. He is now willing to proceed. - TCTS team is aware pt is here, Dr Roxy Manns is out of town this week, informed pt  3. AAA: 4.4 cm. consideration for repair during aortic valve surgery per Dr. Roxy Manns.  4. NSTEMI, Coronary artery disease: L/R cath 08/03/2016 revealed 70% ostial RCA stenosis, with nonobstructive disease elsewhere, results above. CABG w/ AoV surgery per TCTS.  - Troponin  0.25>>0.85. Continue to cycle enzymes -  Since troponin is trending up, change DVT lovenox to heparin - recent cath results above, further testing per MD  5. Bilateral chigger bites in the lower extremities: given 1 dose Vanc and Rocephin by ER MD.  - spoke w/ pharmacy, they recommend Unasyn, will order - w/ pt hx SBE and need for surgery, will continue ABX for now.   6. History of bacterial endocarditis: Patient is no longer on antibiotic therapy at home.  Principal Problem:   Acute diastolic CHF (congestive heart failure), NYHA class 4 (HCC) Active Problems:   Ascending aortic aneurysm (HCC)   NSTEMI (non-ST elevated myocardial infarction) ([place])   Bicuspid aortic valve   Aortic insufficiency   Signed, Rosaria Ferries , PA-C 9:01 AM 09/25/2016 Pager: [phone]   Attending Note:   The patient was seen and examined.  Agree with assessment and plan as noted above.  Changes made to the above note as needed.  Patient seen and independently examined with Rosaria Ferries, PA .   We discussed all aspects of the encounter. I agree with the assessment and plan as stated above.  1.   CHF:   Acute CHF due to AI. Net I/O is -390 Will give Lasix 80 mg IV bid starting this am   2. AI:   Need AVR.   Dr. Roxy Manns will be back next week     I have spent a total of 40 minutes with patient reviewing hospital  notes , telemetry, EKGs, labs and examining patient as well as establishing an assessment and plan that was discussed with the patient. > 50% of time was spent in direct patient care.    Thayer Headings, Brooke Bonito., MD, [facility] 09/25/2016, 10:30 AM [address],  [place] Pager [phone]

## 2016-09-26 DIAGNOSIS — I214 Non-ST elevation (NSTEMI) myocardial infarction: Secondary | ICD-10-CM

## 2016-09-26 LAB — BASIC METABOLIC PANEL
ANION GAP: 9 (ref 5–15)
BUN: 36 mg/dL — AB (ref 6–20)
CALCIUM: 8.4 mg/dL — AB (ref 8.9–10.3)
CO2: 21 mmol/L — ABNORMAL LOW (ref 22–32)
Chloride: 105 mmol/L (ref 101–111)
Creatinine, Ser: 1.49 mg/dL — ABNORMAL HIGH (ref 0.61–1.24)
GFR calc Af Amer: 53 mL/min — ABNORMAL LOW (ref >60)
GFR, EST NON AFRICAN AMERICAN: 46 mL/min — AB (ref >60)
GLUCOSE: 163 mg/dL — AB (ref 65–99)
Potassium: 3.6 mmol/L (ref 3.5–5.1)
Sodium: 135 mmol/L (ref 135–145)

## 2016-09-26 LAB — HEPARIN LEVEL (UNFRACTIONATED)
Heparin Unfractionated: 0.25 IU/mL — ABNORMAL LOW (ref 0.30–0.70)
Heparin Unfractionated: 0.42 IU/mL (ref 0.30–0.70)

## 2016-09-26 MED ORDER — LORAZEPAM 0.5 MG PO TABS
0.5000 mg | ORAL_TABLET | Freq: Three times a day (TID) | ORAL | Status: DC | PRN
  Administered 2016-09-26 – 2016-10-01 (×4): 0.5 mg via ORAL
  Filled 2016-09-26 (×4): qty 1

## 2016-09-26 MED ORDER — METOLAZONE 5 MG PO TABS
2.5000 mg | ORAL_TABLET | Freq: Every day | ORAL | Status: DC
  Administered 2016-09-26 – 2016-09-29 (×4): 2.5 mg via ORAL
  Filled 2016-09-26 (×4): qty 1

## 2016-09-26 MED ORDER — LORAZEPAM 2 MG/ML IJ SOLN
0.5000 mg | Freq: Once | INTRAMUSCULAR | Status: DC
  Filled 2016-09-26: qty 1

## 2016-09-26 NOTE — Progress Notes (Signed)
Patient c/o midsternal chest pain when he ambulated to the bathroom.  The pain resolved when he sat down. Will continue to monitor.

## 2016-09-26 NOTE — Progress Notes (Signed)
DAILY PROGRESS NOTE   Patient Name: CONG HIGHTOWER Date of Encounter: 09/26/2016  Hospital Problem List   Principal Problem:   Acute diastolic CHF (congestive heart failure), NYHA class 4 (HCC) Active Problems:   Ascending aortic aneurysm (HCC)   NSTEMI (non-ST elevated myocardial infarction) ([place])   Bicuspid aortic valve   Aortic insufficiency    Chief Complaint   Short of breath - cannot sleep  Subjective   Minimally diuresing overnight on lasix 80 mg IV BID. Weight up to 232 (from 230). BP remains soft. Awaiting evaluation for AVR by Dr. Roxy Manns next week. Creatinine 1.49 - stable. Troponin elevated at 0.85.  Objective   Vitals:   09/25/16 1936 09/26/16 0009 09/26/16 0448 09/26/16 0919  BP: (!) 94/43 (!) 97/33 (!) 107/39 (!) 114/40  Pulse: 79 78 82   Resp:      Temp: 98 F (36.7 C) 98.1 F (36.7 C) 97.8 F (36.6 C) 98 F (36.7 C)  TempSrc: Oral Oral Oral Oral  SpO2: 96% 96% 97% 94%  Weight:   232 lb 11.2 oz (105.6 kg)   Height:        Intake/Output Summary (Last 24 hours) at 09/26/16 1021 Last data filed at 09/26/16 0853  Gross per 24 hour  Intake          1178.75 ml  Output             1350 ml  Net          -171.25 ml   Filed Weights   09/24/16 0648 09/25/16 0333 09/26/16 0448  Weight: 229 lb (103.9 kg) 230 lb 1.6 oz (104.4 kg) 232 lb 11.2 oz (105.6 kg)    Physical Exam   General appearance: alert, fatigued and no distress Lungs: diminished breath sounds bibasilar Heart: regular rate and rhythm, S1, S2 normal and diastolic murmur: mid diastolic 3/6, blowing at 2nd right intercostal space Extremities: extremities normal, atraumatic, no cyanosis or edema Neurologic: Mental status: Alert, oriented, thought content appropriate   Inpatient Medications    Scheduled Meds: . aspirin EC  81 mg Oral Daily  . atorvastatin  80 mg Oral q1800  . chlorhexidine  15 mL Mouth/Throat QID  . feeding supplement (ENSURE ENLIVE)  237 mL Oral BID BM  . ferrous  sulfate  325 mg Oral Q breakfast  . furosemide  80 mg Intravenous BID  . potassium chloride  20 mEq Oral Daily  . saccharomyces boulardii  250 mg Oral BID    Continuous Infusions: . ampicillin-sulbactam (UNASYN) IV 1.5 g (09/26/16 0921)  . heparin 1,800 Units/hr (09/26/16 0542)    PRN Meds: acetaminophen, nitroGLYCERIN, ondansetron (ZOFRAN) IV   Labs   Results for orders placed or performed during the hospital encounter of 09/24/16 (from the past 48 hour(s))  Troponin I (q 6hr x 3)     Status: Abnormal   Collection Time: 09/24/16 10:36 AM  Result Value Ref Range   Troponin I 0.85 (HH) <0.03 ng/mL    Comment: CRITICAL RESULT CALLED TO, READ BACK BY AND VERIFIED WITH: MINTER,R AT 11:20AM ON 09/24/16 BY [facility]   Basic metabolic panel     Status: Abnormal   Collection Time: 09/24/16  6:34 PM  Result Value Ref Range   Sodium 135 135 - 145 mmol/L   Potassium 4.6 3.5 - 5.1 mmol/L    Comment: SPECIMEN HEMOLYZED. HEMOLYSIS MAY AFFECT INTEGRITY OF RESULTS.   Chloride 105 101 - 111 mmol/L   CO2 20 (L) 22 -  32 mmol/L   Glucose, Bld 138 (H) 65 - 99 mg/dL   BUN 25 (H) 6 - 20 mg/dL   Creatinine, Ser 1.47 (H) 0.61 - 1.24 mg/dL   Calcium 8.3 (L) 8.9 - 10.3 mg/dL   GFR calc non Af Amer 47 (L) >60 mL/min   GFR calc Af Amer 54 (L) >60 mL/min    Comment: (NOTE) The eGFR has been calculated using the CKD EPI equation. This calculation has not been validated in all clinical situations. eGFR's persistently <60 mL/min signify possible Chronic Kidney Disease.    Anion gap 10 5 - 15  Basic metabolic panel     Status: Abnormal   Collection Time: 09/25/16  4:12 AM  Result Value Ref Range   Sodium 137 135 - 145 mmol/L   Potassium 3.9 3.5 - 5.1 mmol/L   Chloride 104 101 - 111 mmol/L   CO2 22 22 - 32 mmol/L   Glucose, Bld 116 (H) 65 - 99 mg/dL   BUN 27 (H) 6 - 20 mg/dL   Creatinine, Ser 1.34 (H) 0.61 - 1.24 mg/dL   Calcium 8.5 (L) 8.9 - 10.3 mg/dL   GFR calc non Af Amer 52 (L) >60 mL/min    GFR calc Af Amer >60 >60 mL/min    Comment: (NOTE) The eGFR has been calculated using the CKD EPI equation. This calculation has not been validated in all clinical situations. eGFR's persistently <60 mL/min signify possible Chronic Kidney Disease.    Anion gap 11 5 - 15  CBC     Status: Abnormal   Collection Time: 09/25/16  4:12 AM  Result Value Ref Range   WBC 11.2 (H) 4.0 - 10.5 K/uL   RBC 4.83 4.22 - 5.81 MIL/uL   Hemoglobin 12.2 (L) 13.0 - 17.0 g/dL   HCT 39.1 39.0 - 52.0 %   MCV 81.0 78.0 - 100.0 fL   MCH 25.3 (L) 26.0 - 34.0 pg   MCHC 31.2 30.0 - 36.0 g/dL   RDW 16.5 (H) 11.5 - 15.5 %   Platelets 222 150 - 400 K/uL  Heparin level (unfractionated)     Status: Abnormal   Collection Time: 09/25/16  6:11 PM  Result Value Ref Range   Heparin Unfractionated 0.21 (L) 0.30 - 0.70 IU/mL    Comment:        IF HEPARIN RESULTS ARE BELOW EXPECTED VALUES, AND PATIENT DOSAGE HAS BEEN CONFIRMED, SUGGEST FOLLOW UP TESTING OF ANTITHROMBIN III LEVELS.   Basic metabolic panel     Status: Abnormal   Collection Time: 09/26/16  3:46 AM  Result Value Ref Range   Sodium 135 135 - 145 mmol/L   Potassium 3.6 3.5 - 5.1 mmol/L   Chloride 105 101 - 111 mmol/L   CO2 21 (L) 22 - 32 mmol/L   Glucose, Bld 163 (H) 65 - 99 mg/dL   BUN 36 (H) 6 - 20 mg/dL   Creatinine, Ser 1.49 (H) 0.61 - 1.24 mg/dL   Calcium 8.4 (L) 8.9 - 10.3 mg/dL   GFR calc non Af Amer 46 (L) >60 mL/min   GFR calc Af Amer 53 (L) >60 mL/min    Comment: (NOTE) The eGFR has been calculated using the CKD EPI equation. This calculation has not been validated in all clinical situations. eGFR's persistently <60 mL/min signify possible Chronic Kidney Disease.    Anion gap 9 5 - 15  Heparin level (unfractionated)     Status: Abnormal   Collection Time: 09/26/16  3:46 AM  Result Value Ref Range   Heparin Unfractionated 0.25 (L) 0.30 - 0.70 IU/mL    Comment:        IF HEPARIN RESULTS ARE BELOW EXPECTED VALUES, AND  PATIENT DOSAGE HAS BEEN CONFIRMED, SUGGEST FOLLOW UP TESTING OF ANTITHROMBIN III LEVELS.     ECG   None  Telemetry   NSR - Personally Reviewed  Radiology    No results found.  Cardiac Studies   None  Assessment   1. Principal Problem: 2.   Acute diastolic CHF (congestive heart failure), NYHA class 4 (HCC) 3. Active Problems: 4.   Ascending aortic aneurysm ([place]) 5.   NSTEMI (non-ST elevated myocardial infarction) ([place]) 6.   Bicuspid aortic valve 7.   Aortic insufficiency 8.   Plan   1. Insignificant diuresis - add metolazone. Not sleeping well, secondary to platypnea. Await CT surgery evaluation next week.  Time Spent Directly with Patient:  15 minutes  Length of Stay:  LOS: 2 days   Pixie Casino, MD, [place]  Attending Cardiologist  Direct Dial: [phone]  Fax: [phone]  Website:  [email or web address]  Nadean Corwin Laiken Sandy 09/26/2016, 10:21 AM

## 2016-09-26 NOTE — Progress Notes (Signed)
Spoke with Rosaria Ferries, hold PICC placement for today. IV team aware.  Will cont to monitor pt.

## 2016-09-26 NOTE — Progress Notes (Signed)
ANTICOAGULATION CONSULT NOTE - Follow Up Consult  Pharmacy Consult for Heparin Indication: NSTEMI  Allergies  Allergen Reactions  . No Known Allergies     Patient Measurements: Height: 6' (182.9 cm) Weight: 232 lb 11.2 oz (105.6 kg) IBW/kg (Calculated) : 77.6 Heparin dosing wt: 99 kg  Vital Signs: Temp: 97.8 F (36.6 C) (06/16 0448) Temp Source: Oral (06/16 0448) BP: 107/39 (06/16 0448) Pulse Rate: 82 (06/16 0448)  Labs:  Recent Labs  09/24/16 0715 09/24/16 1036 09/24/16 1834 09/25/16 0412 09/25/16 1811 09/26/16 0346  HGB 12.1*  --   --  12.2*  --   --   HCT 38.5*  --   --  39.1  --   --   PLT 219  --   --  222  --   --   HEPARINUNFRC  --   --   --   --  0.21* 0.25*  CREATININE 1.25*  --  1.47* 1.34*  --   --   TROPONINI 0.25* 0.85*  --   --   --   --     Estimated Creatinine Clearance: 65.3 mL/min (A) (by C-G formula based on SCr of 1.34 mg/dL (H)).  Assessment: 13 YOM on heparin for NSTEMI. Heparin level remains subtherapeutic (0.25) on gtt at 1500 units/hr. No issues with line or bleeding reported per RN.  Goal of Therapy:  Heparin level 0.3-0.7 units/ml Monitor platelets by anticoagulation protocol: Yes   Plan:  Increase heparin to 1800 units/hr Will f/u 6 hr heparin level  Sherlon Handing, PharmD, BCPS Clinical pharmacist, pager [phone] 09/26/2016,5:36 AM

## 2016-09-26 NOTE — Progress Notes (Signed)
ANTICOAGULATION CONSULT NOTE - Follow Up Consult  Pharmacy Consult for Heparin Indication: NSTEMI  Allergies  Allergen Reactions  . No Known Allergies     Patient Measurements: Height: 6' (182.9 cm) Weight: 232 lb 11.2 oz (105.6 kg) IBW/kg (Calculated) : 77.6 Heparin dosing wt: 99 kg  Vital Signs: Temp: 98 F (36.7 C) (06/16 0919) Temp Source: Oral (06/16 0919) BP: 125/46 (06/16 1203) Pulse Rate: 88 (06/16 1203)  Labs:  Recent Labs  09/24/16 0715 09/24/16 1036 09/24/16 1834 09/25/16 0412 09/25/16 1811 09/26/16 0346 09/26/16 1214  HGB 12.1*  --   --  12.2*  --   --   --   HCT 38.5*  --   --  39.1  --   --   --   PLT 219  --   --  222  --   --   --   HEPARINUNFRC  --   --   --   --  0.21* 0.25* 0.42  CREATININE 1.25*  --  1.47* 1.34*  --  1.49*  --   TROPONINI 0.25* 0.85*  --   --   --   --   --     Estimated Creatinine Clearance: 58.8 mL/min (A) (by C-G formula based on SCr of 1.49 mg/dL (H)).  Assessment: 48 YOM on heparin for NSTEMI. Heparin level therapeutic at 0.42 on heparin gtt at 1800 units/hr. CBC stable from 6/15 with no s/s bleeding noted.   Goal of Therapy:  Heparin level 0.3-0.7 units/ml Monitor platelets by anticoagulation protocol: Yes   Plan:  Continue heparin gtt at 1800 units/hr Heparin level and CBC daily Monitor for s/s bleeding   Argie Ramming, PharmD Pharmacy Resident  Pager [phone] 09/26/16 12:58 PM

## 2016-09-26 NOTE — Progress Notes (Signed)
Patient is c/o SOB with exertion and at rest.  He is sating 100% on O2 at @ 2L.  Denies pain at this time. Will continue to monitor.

## 2016-09-27 DIAGNOSIS — G4701 Insomnia due to medical condition: Secondary | ICD-10-CM

## 2016-09-27 DIAGNOSIS — I359 Nonrheumatic aortic valve disorder, unspecified: Secondary | ICD-10-CM

## 2016-09-27 DIAGNOSIS — G4700 Insomnia, unspecified: Secondary | ICD-10-CM | POA: Diagnosis present

## 2016-09-27 LAB — BASIC METABOLIC PANEL
Anion gap: 13 (ref 5–15)
BUN: 44 mg/dL — AB (ref 6–20)
CALCIUM: 8.7 mg/dL — AB (ref 8.9–10.3)
CO2: 21 mmol/L — ABNORMAL LOW (ref 22–32)
CREATININE: 1.47 mg/dL — AB (ref 0.61–1.24)
Chloride: 104 mmol/L (ref 101–111)
GFR calc Af Amer: 54 mL/min — ABNORMAL LOW (ref >60)
GFR, EST NON AFRICAN AMERICAN: 47 mL/min — AB (ref >60)
GLUCOSE: 177 mg/dL — AB (ref 65–99)
Potassium: 3.5 mmol/L (ref 3.5–5.1)
Sodium: 138 mmol/L (ref 135–145)

## 2016-09-27 LAB — CBC
HCT: 36.2 % — ABNORMAL LOW (ref 39.0–52.0)
Hemoglobin: 11.2 g/dL — ABNORMAL LOW (ref 13.0–17.0)
MCH: 25.1 pg — AB (ref 26.0–34.0)
MCHC: 30.9 g/dL (ref 30.0–36.0)
MCV: 81.2 fL (ref 78.0–100.0)
PLATELETS: 206 10*3/uL (ref 150–400)
RBC: 4.46 MIL/uL (ref 4.22–5.81)
RDW: 16.6 % — AB (ref 11.5–15.5)
WBC: 11.3 10*3/uL — ABNORMAL HIGH (ref 4.0–10.5)

## 2016-09-27 LAB — HEPARIN LEVEL (UNFRACTIONATED): Heparin Unfractionated: 0.4 IU/mL (ref 0.30–0.70)

## 2016-09-27 MED ORDER — POLYETHYLENE GLYCOL 3350 17 G PO PACK
17.0000 g | PACK | Freq: Every day | ORAL | Status: DC | PRN
  Filled 2016-09-27: qty 1

## 2016-09-27 MED ORDER — ZOLPIDEM TARTRATE 5 MG PO TABS: 5.0000 mg | ORAL_TABLET | Freq: Every evening | ORAL | Status: DC | PRN

## 2016-09-27 NOTE — Progress Notes (Signed)
DAILY PROGRESS NOTE   Patient Name: Charles [name] Date of Encounter: 09/27/2016  Hospital Problem List   Principal Problem:   Acute diastolic CHF (congestive heart failure), NYHA class 4 (HCC) Active Problems:   Ascending aortic aneurysm (HCC)   NSTEMI (non-ST elevated myocardial infarction) ([place])   Bicuspid aortic valve   Aortic insufficiency    Chief Complaint   Trouble sleeping  Subjective   Diursed 1.6L negative yesterday with the addition of metolazone.  Weight down 1 lb.   Objective   Vitals:   09/26/16 1550 09/26/16 2019 09/27/16 0001 09/27/16 0346  BP: (!) 104/30 (!) 108/33 111/65 (!) 110/36  Pulse: 80 83 77 80  Resp: (!) 22 19 (!) 24 (!) 21  Temp: 98.1 F (36.7 C) 97.8 F (36.6 C) 97.8 F (36.6 C) 98.3 F (36.8 C)  TempSrc: Oral     SpO2: 96% 95% 94% 95%  Weight:    231 lb 1.6 oz (104.8 kg)  Height:        Intake/Output Summary (Last 24 hours) at 09/27/16 8366 Last data filed at 09/27/16 0500  Gross per 24 hour  Intake              600 ml  Output             2225 ml  Net            -1625 ml   Filed Weights   09/25/16 0333 09/26/16 0448 09/27/16 0346  Weight: 230 lb 1.6 oz (104.4 kg) 232 lb 11.2 oz (105.6 kg) 231 lb 1.6 oz (104.8 kg)    Physical Exam   General appearance: alert, fatigued and no distress Lungs: diminished breath sounds bibasilar Heart: regular rate and rhythm, S1, S2 normal and diastolic murmur: mid diastolic 3/6, blowing at 2nd right intercostal space Extremities: extremities normal, atraumatic, no cyanosis or edema Neurologic: Mental status: Alert, oriented, thought content appropriate   Inpatient Medications    Scheduled Meds: . aspirin EC  81 mg Oral Daily  . atorvastatin  80 mg Oral q1800  . chlorhexidine  15 mL Mouth/Throat QID  . feeding supplement (ENSURE ENLIVE)  237 mL Oral BID BM  . ferrous sulfate  325 mg Oral Q breakfast  . furosemide  80 mg Intravenous BID  . LORazepam  0.5 mg Intravenous Once  .  metolazone  2.5 mg Oral Daily  . potassium chloride  20 mEq Oral Daily  . saccharomyces boulardii  250 mg Oral BID    Continuous Infusions: . ampicillin-sulbactam (UNASYN) IV Stopped (09/27/16 0451)  . heparin 1,800 Units/hr (09/26/16 2115)    PRN Meds: acetaminophen, LORazepam, nitroGLYCERIN, ondansetron (ZOFRAN) IV   Labs   Results for orders placed or performed during the hospital encounter of 09/24/16 (from the past 48 hour(s))  Heparin level (unfractionated)     Status: Abnormal   Collection Time: 09/25/16  6:11 PM  Result Value Ref Range   Heparin Unfractionated 0.21 (L) 0.30 - 0.70 IU/mL    Comment:        IF HEPARIN RESULTS ARE BELOW EXPECTED VALUES, AND PATIENT DOSAGE HAS BEEN CONFIRMED, SUGGEST FOLLOW UP TESTING OF ANTITHROMBIN III LEVELS.   Basic metabolic panel     Status: Abnormal   Collection Time: 09/26/16  3:46 AM  Result Value Ref Range   Sodium 135 135 - 145 mmol/L   Potassium 3.6 3.5 - 5.1 mmol/L   Chloride 105 101 - 111 mmol/L   CO2 21 (L) 22 -  32 mmol/L   Glucose, Bld 163 (H) 65 - 99 mg/dL   BUN 36 (H) 6 - 20 mg/dL   Creatinine, Ser 1.49 (H) 0.61 - 1.24 mg/dL   Calcium 8.4 (L) 8.9 - 10.3 mg/dL   GFR calc non Af Amer 46 (L) >60 mL/min   GFR calc Af Amer 53 (L) >60 mL/min    Comment: (NOTE) The eGFR has been calculated using the CKD EPI equation. This calculation has not been validated in all clinical situations. eGFR's persistently <60 mL/min signify possible Chronic Kidney Disease.    Anion gap 9 5 - 15  Heparin level (unfractionated)     Status: Abnormal   Collection Time: 09/26/16  3:46 AM  Result Value Ref Range   Heparin Unfractionated 0.25 (L) 0.30 - 0.70 IU/mL    Comment:        IF HEPARIN RESULTS ARE BELOW EXPECTED VALUES, AND PATIENT DOSAGE HAS BEEN CONFIRMED, SUGGEST FOLLOW UP TESTING OF ANTITHROMBIN III LEVELS.   Heparin level (unfractionated)     Status: None   Collection Time: 09/26/16 12:14 PM  Result Value Ref Range    Heparin Unfractionated 0.42 0.30 - 0.70 IU/mL    Comment:        IF HEPARIN RESULTS ARE BELOW EXPECTED VALUES, AND PATIENT DOSAGE HAS BEEN CONFIRMED, SUGGEST FOLLOW UP TESTING OF ANTITHROMBIN III LEVELS.   Heparin level (unfractionated)     Status: None   Collection Time: 09/27/16  3:10 AM  Result Value Ref Range   Heparin Unfractionated 0.40 0.30 - 0.70 IU/mL    Comment:        IF HEPARIN RESULTS ARE BELOW EXPECTED VALUES, AND PATIENT DOSAGE HAS BEEN CONFIRMED, SUGGEST FOLLOW UP TESTING OF ANTITHROMBIN III LEVELS.   CBC     Status: Abnormal   Collection Time: 09/27/16  3:10 AM  Result Value Ref Range   WBC 11.3 (H) 4.0 - 10.5 K/uL   RBC 4.46 4.22 - 5.81 MIL/uL   Hemoglobin 11.2 (L) 13.0 - 17.0 g/dL   HCT 36.2 (L) 39.0 - 52.0 %   MCV 81.2 78.0 - 100.0 fL   MCH 25.1 (L) 26.0 - 34.0 pg   MCHC 30.9 30.0 - 36.0 g/dL   RDW 16.6 (H) 11.5 - 15.5 %   Platelets 206 150 - 400 K/uL    ECG   None  Telemetry   NSR - Personally Reviewed  Radiology    No results found.  Cardiac Studies   None  Assessment   Principal Problem:   Acute diastolic CHF (congestive heart failure), NYHA class 4 (HCC) Active Problems:   Ascending aortic aneurysm (HCC)   NSTEMI (non-ST elevated myocardial infarction) (HCC)   Bicuspid aortic valve   Aortic insufficiency   Plan   1. Improved diuresis overnight. Will continue with metolazone and lasix today. Still sleeping poorly. Ativan helped some -will Rx for ambien 5 mg QHS PRN. Plan CT surgery evaluation tomorrow. Check BMET.  Time Spent Directly with Patient:  15 minutes  Length of Stay:  LOS: 3 days   Pixie Casino, MD, [place]  Attending Cardiologist  Direct Dial: [phone]  Fax: [phone]  Website:  [email or web address]  Charles [name] 09/27/2016, 8:12 AM

## 2016-09-27 NOTE — Plan of Care (Signed)
Problem: Physical Regulation: Goal: Ability to maintain clinical measurements within normal limits will improve Outcome: Progressing Pt c/o small episode of CP w/ exertion at start of shift. Quickly resolved with rest. Pt also reports unable to sleep, feeling of SOB. All Vital signs stable, lungs clear. Educated pt on anxiety and PRN medication. PRN administered w/ some relief. Continue to monitor.

## 2016-09-27 NOTE — Progress Notes (Signed)
ANTICOAGULATION CONSULT NOTE - Follow Up Consult  Pharmacy Consult for Heparin Indication: NSTEMI  Allergies  Allergen Reactions  . No Known Allergies     Patient Measurements: Height: 6' (182.9 cm) Weight: 231 lb 1.6 oz (104.8 kg) IBW/kg (Calculated) : 77.6 Heparin dosing wt: 99 kg  Vital Signs: Temp: 98.3 F (36.8 C) (06/17 0346) BP: 110/36 (06/17 0346) Pulse Rate: 80 (06/17 0346)  Labs:  Recent Labs  09/24/16 1036 09/24/16 1834 09/25/16 0412  09/26/16 0346 09/26/16 1214 09/27/16 0310  HGB  --   --  12.2*  --   --   --  11.2*  HCT  --   --  39.1  --   --   --  36.2*  PLT  --   --  222  --   --   --  206  HEPARINUNFRC  --   --   --   < > 0.25* 0.42 0.40  CREATININE  --  1.47* 1.34*  --  1.49*  --   --   TROPONINI 0.85*  --   --   --   --   --   --   < > = values in this interval not displayed.  Estimated Creatinine Clearance: 58.6 mL/min (A) (by C-G formula based on SCr of 1.49 mg/dL (H)).  Assessment: 39 YOM on heparin for NSTEMI. Heparin level therapeutic at 0.4 on heparin gtt at 1800 units/hr. CBC low-stable with no s/s bleeding noted.   Goal of Therapy:  Heparin level 0.3-0.7 units/ml Monitor platelets by anticoagulation protocol: Yes   Plan:  Continue heparin gtt at 1800 units/hr Heparin level and CBC daily Monitor for s/s bleeding   Argie Ramming, PharmD Pharmacy Resident  Pager [phone] 09/27/16 8:37 AM

## 2016-09-28 ENCOUNTER — Inpatient Hospital Stay (HOSPITAL_COMMUNITY): Payer: Medicare Other

## 2016-09-28 DIAGNOSIS — I351 Nonrheumatic aortic (valve) insufficiency: Secondary | ICD-10-CM

## 2016-09-28 DIAGNOSIS — I2510 Atherosclerotic heart disease of native coronary artery without angina pectoris: Secondary | ICD-10-CM

## 2016-09-28 DIAGNOSIS — I340 Nonrheumatic mitral (valve) insufficiency: Secondary | ICD-10-CM

## 2016-09-28 LAB — HEPARIN LEVEL (UNFRACTIONATED): HEPARIN UNFRACTIONATED: 0.52 [IU]/mL (ref 0.30–0.70)

## 2016-09-28 LAB — ECHOCARDIOGRAM COMPLETE
Height: 72 in
Weight: 3696 oz

## 2016-09-28 LAB — BASIC METABOLIC PANEL
Anion gap: 10 (ref 5–15)
BUN: 45 mg/dL — AB (ref 6–20)
CO2: 29 mmol/L (ref 22–32)
CREATININE: 1.49 mg/dL — AB (ref 0.61–1.24)
Calcium: 8.6 mg/dL — ABNORMAL LOW (ref 8.9–10.3)
Chloride: 99 mmol/L — ABNORMAL LOW (ref 101–111)
GFR, EST AFRICAN AMERICAN: 53 mL/min — AB (ref >60)
GFR, EST NON AFRICAN AMERICAN: 46 mL/min — AB (ref >60)
Glucose, Bld: 107 mg/dL — ABNORMAL HIGH (ref 65–99)
POTASSIUM: 3.1 mmol/L — AB (ref 3.5–5.1)
SODIUM: 138 mmol/L (ref 135–145)

## 2016-09-28 LAB — CBC
HCT: 37.2 % — ABNORMAL LOW (ref 39.0–52.0)
Hemoglobin: 11.5 g/dL — ABNORMAL LOW (ref 13.0–17.0)
MCH: 24.9 pg — AB (ref 26.0–34.0)
MCHC: 30.9 g/dL (ref 30.0–36.0)
MCV: 80.7 fL (ref 78.0–100.0)
PLATELETS: 225 10*3/uL (ref 150–400)
RBC: 4.61 MIL/uL (ref 4.22–5.81)
RDW: 16.5 % — ABNORMAL HIGH (ref 11.5–15.5)
WBC: 9.1 10*3/uL (ref 4.0–10.5)

## 2016-09-28 MED ORDER — SODIUM CHLORIDE 0.9 % IV SOLN
3.0000 g | Freq: Three times a day (TID) | INTRAVENOUS | Status: DC
  Filled 2016-09-28: qty 3

## 2016-09-28 MED ORDER — DEXTROSE 5 % IV SOLN
2.0000 g | INTRAVENOUS | Status: DC
  Administered 2016-09-28 – 2016-10-01 (×4): 2 g via INTRAVENOUS
  Filled 2016-09-28 (×5): qty 2

## 2016-09-28 MED ORDER — SODIUM CHLORIDE 0.9 % IV SOLN
3.0000 g | Freq: Three times a day (TID) | INTRAVENOUS | Status: DC
  Administered 2016-09-28: 3 g via INTRAVENOUS
  Filled 2016-09-28 (×3): qty 3

## 2016-09-28 MED ORDER — VANCOMYCIN HCL 10 G IV SOLR
2000.0000 mg | Freq: Once | INTRAVENOUS | Status: AC
  Administered 2016-09-28: 2000 mg via INTRAVENOUS
  Filled 2016-09-28: qty 2000

## 2016-09-28 MED ORDER — VANCOMYCIN HCL IN DEXTROSE 750-5 MG/150ML-% IV SOLN
750.0000 mg | Freq: Two times a day (BID) | INTRAVENOUS | Status: DC
  Administered 2016-09-29 – 2016-10-01 (×5): 750 mg via INTRAVENOUS
  Filled 2016-09-28 (×6): qty 150

## 2016-09-28 NOTE — Progress Notes (Signed)
  Echocardiogram 2D Echocardiogram has been performed.  Besse Miron T Bryston Colocho 09/28/2016, 4:55 PM

## 2016-09-28 NOTE — Progress Notes (Signed)
Pharmacy Antibiotic Note  Charles [name] is a 71 y.o. 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 He is readmitted with  WBC 11, afebrile but TEE with new AV vegetation. Plan broaden ABX therapy, recheck Bcx.   Pharmacy has been consulted for vancomycin and rocephin  Dosing. Cr 1.5 CrCl 91ml/min  Plan: Stop unasyn Rocephin 2gm IV q24h vancomyicn 2gm IV x1 then 750mg  q12h    Height: 6' (182.9 cm) Weight: 231 lb (104.8 kg) IBW/kg (Calculated) : 77.6  Temp (24hrs), Avg:98.1 F (36.7 C), Min:97.7 F (36.5 C), Max:98.8 F (37.1 C)   Recent Labs Lab 09/24/16 0715 09/24/16 1834 09/25/16 0412 09/26/16 0346 09/27/16 0310 09/27/16 0849 09/28/16 0218  WBC 10.2  --  11.2*  --  11.3*  --  9.1  CREATININE 1.25* 1.47* 1.34* 1.49*  --  1.47* 1.49*    Estimated Creatinine Clearance: 58.6 mL/min (A) (by C-G formula based on SCr of 1.49 mg/dL (H)).    Allergies  Allergen Reactions  . No Known Allergies     Antimicrobials this admission:   Dose adjustments this admission:   Microbiology results: 6/14 BCx > ngtd  Bonnita Nasuti Pharm.D. CPP, BCPS Clinical Pharmacist [phone] 09/28/2016 9:50 PM

## 2016-09-28 NOTE — Progress Notes (Signed)
Contacted by Dr. Aundra Dubin regarding possible vegetation on aortic valve. Discussed with our clinical pharmacist, D/C Unasyn for cellulitis and change to broad spectrum abx include IV Vanc and Rocephin. Redraw Blood culture prior to abx. Case discussed with nurse.   Morning team to consult ID. Will inform Dr. Stanford Breed  Signed, Almyra Deforest PA Pager: [phone]

## 2016-09-28 NOTE — Progress Notes (Signed)
ANTICOAGULATION CONSULT NOTE - Follow Up Consult  Pharmacy Consult for Heparin Indication: NSTEMI  Allergies  Allergen Reactions  . No Known Allergies     Patient Measurements: Height: 6' (182.9 cm) Weight: 231 lb (104.8 kg) IBW/kg (Calculated) : 77.6 Heparin dosing wt: 99 kg  Vital Signs: BP: 102/39 (06/18 0430)  Labs:  Recent Labs  09/26/16 0346 09/26/16 1214 09/27/16 0310 09/27/16 0849 09/28/16 0218  HGB  --   --  11.2*  --  11.5*  HCT  --   --  36.2*  --  37.2*  PLT  --   --  206  --  225  HEPARINUNFRC 0.25* 0.42 0.40  --  0.52  CREATININE 1.49*  --   --  1.47* 1.49*    Estimated Creatinine Clearance: 58.6 mL/min (A) (by C-G formula based on SCr of 1.49 mg/dL (H)).  Assessment: 62 YOM on heparin for NSTEMI. Heparin level remains therapeutic. CBC stable. CTS to evaluate today.   Goal of Therapy:  Heparin level 0.3-0.7 units/ml Monitor platelets by anticoagulation protocol: Yes   Plan:  1. Continue heparin gtt at 1800 units/hr 2. Heparin level and CBC daily 3. Monitor for s/s bleeding   Vincenza Hews, PharmD, BCPS 09/28/2016, 8:40 AM Phone# [phone]

## 2016-09-28 NOTE — Consult Note (Signed)
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  I had the opportunity to evaluate the patient in consultation during his hospitalization on 07/04/2016.  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 The jet of aortic insufficiency was quite eccentric and there was questions regarding the possibility of leaflet perforation. CT angiogram of the chest confirmed the presence of moderate aneurysmal enlargement of the aorta with maximum transverse diameter 4.4 cm.  The patient did not have symptoms or signs of congestive heart failure at the time and he responded well to medical therapy for endocarditis.  He completed his course of intravenous antibiotics as an outpatient and was seen in follow-up by Dr. Megan Salon from infectious disease and Dr. Domenic Polite in cardiology. He underwent diagnostic cardiac catheterization on 08/03/2016 revealing the presence of 70% ostial stenosis of the right coronary artery that was felt to be severe because of the degree of dampening pressure with catheter engagement. There was mild nonobstructive coronary artery disease involving the left anterior descending coronary artery and left circumflex coronary artery.  Left ventricular end-diastolic pressure was mildly elevated at 24 mmHg.  The patient recently underwent dental extraction by Dr.  Enrique Sack and he returned to our office for follow-up on 08/17/2016.  At that point the patient claims to be essentially asymptomatic and remained reluctant to consider proceeding with elective aortic valve replacement and coronary artery bypass grafting, despite the fact that there were concerns regarding the possibility that he may have perforation of his aortic valve with questions about the chronicity of aortic insufficiency. He decided to seek a second opinion and apparently was evaluated in the office by Dr. Einar Gip who also encouraged him to consider proceeding with surgery.  Medications were adjusted at that time and apparently the patient was taken off of Lasix. Since then he developed worsening symptoms of shortness of breath and lower extremity edema. Symptoms progressed until 09/24/2016 when he presented acutely to the emergency room at [facility] with resting shortness of breath.  He also reported some symptoms of burning substernal chest pain that occur only with activity. Troponin levels were mildly elevated and portable chest x-ray revealed congestive heart failure.  The patient was admitted to [facility] for further management. He has since then improved with medical therapy and follow up cardiothoracic surgical consultation was requested.   Past Medical History:  Diagnosis Date  . Anemia   . Aortic insufficiency    a. 07/07/2016: TEE showing bicuspid aortic valve with severe eccentric AI   . Ascending aortic aneurysm (HCC)    4.4 cm by CT 06/2016  . 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 Bicuspid aortic valve   . Chronic periodontitis   . Coronary artery disease   . Pneumonia 1978    Past Surgical History:  Procedure Laterality Date  .  HERNIA REPAIR Left   . MULTIPLE EXTRACTIONS WITH ALVEOLOPLASTY N/A 08/13/2016   Procedure: Extraction of tooth #'s 2,4,14 with alveoloplasty;  Surgeon: Lenn Cal, DDS;  Location: [facility];   Service: Oral Surgery;  Laterality: N/A;  . RIGHT/LEFT HEART CATH AND CORONARY ANGIOGRAPHY N/A 08/03/2016   Procedure: Right/Left Heart Cath and Coronary Angiography;  Surgeon: Burnell Blanks, MD;  Location: [facility];  Service: Cardiovascular;  Laterality: N/A;  . TEE WITHOUT CARDIOVERSION N/A 07/07/2016   Procedure: TRANSESOPHAGEAL ECHOCARDIOGRAM (TEE);  Surgeon: Larey Dresser, MD;  Location: [facility] ENDOSCOPY;  Service: Cardiovascular;  Laterality: N/A;    Family History  Problem Relation Age of Onset  . Ovarian cancer Mother   . Heart disease Father 18       Died of coronary thrombosis.      Social History   Social History  . Marital status: Married    Spouse name: N/A  . Number of children: 3  . Years of education: N/A   Occupational History  . Not on file.   Social History Main Topics  . Smoking status: Former Smoker    Types: Cigarettes    Quit date: 04/30/1965  . Smokeless tobacco: Never Used     Comment: Quit 30 years ago.    . Alcohol use No  . Drug use: No  . Sexual activity: Not on file   Other Topics Concern  . Not on file   Social History Narrative  . No narrative on file    Prior to Admission medications   Medication Sig Start Date End Date Taking? Authorizing Provider  acetaminophen (TYLENOL) 325 MG tablet Take 650 mg by mouth every 6 (six) hours as needed for headache.   Yes [provider]  aspirin EC 81 MG tablet Take 81 mg by mouth daily.   Yes [provider]  chlorhexidine (PERIDEX) 0.12 % solution Rinse with 15 mls twice daily for 30 seconds. Use after breakfast and at bedtime. Spit out excess. Do not swallow. 08/13/16  Yes Lenn Cal, DDS  Ferrous Sulfate 27 MG TABS Take 27 mg by mouth daily.   Yes [provider]  furosemide (LASIX) 40 MG tablet Take 1 tablet (40 mg total) by mouth daily. Patient taking differently: Take 40 mg by mouth daily as needed for fluid.  07/13/16 10/11/16 Yes Strader, Fransisco Hertz, PA-C  losartan-hydrochlorothiazide (HYZAAR) 50-12.5 MG tablet Take 1 tablet by mouth daily. 09/21/16  Yes [provider]  Multiple Vitamin (MULTIVITAMIN WITH MINERALS) TABS tablet Take 1 tablet by mouth daily.   Yes [provider]  nitroGLYCERIN (NITROSTAT) 0.4 MG SL tablet Place 1 tablet (0.4 mg total) under the tongue every 5 (five) minutes as needed. 09/02/16  Yes Satira Sark, MD  rosuvastatin (CRESTOR) 10 MG tablet Take 1 tablet by mouth daily. 09/22/16  Yes [provider]    Current Facility-Administered Medications  Medication Dose Route Frequency Provider Last Rate Last Dose  . acetaminophen (TYLENOL) tablet 650 mg  650 mg Oral Q4H PRN Lendon Colonel, NP      . Ampicillin-Sulbactam (UNASYN) 3 g in sodium chloride 0.9 % 100 mL IVPB  3 g Intravenous Q8H Kate Sable A, MD 200 mL/hr at 09/28/16 1237 3 g at 09/28/16 1237  . aspirin EC tablet 81 mg  81 mg Oral Daily Lendon Colonel, NP   81 mg at 09/28/16 9509  . atorvastatin (LIPITOR) tablet 80 mg  80 mg Oral q1800  Lendon Colonel, NP   80 mg at 09/28/16 1712  . chlorhexidine (PERIDEX) 0.12 % solution 15 mL  15 mL Mouth/Throat QID Lendon Colonel, NP   15 mL at 09/28/16 1713  . feeding supplement (ENSURE ENLIVE) (ENSURE ENLIVE) liquid 237 mL  237 mL Oral BID BM Herminio Commons, MD   237 mL at 09/28/16 1513  . ferrous sulfate tablet 325 mg  325 mg Oral Q breakfast Lendon Colonel, NP   325 mg at 09/26/16 0920  . furosemide (LASIX) injection 80 mg  80 mg Intravenous BID Nahser, Wonda Cheng, MD   80 mg at 09/28/16 1713  . heparin ADULT infusion 100 units/mL (25000 units/241mL sodium chloride 0.45%)  1,800 Units/hr Intravenous Continuous Franky Macho, RPH 18 mL/hr at 09/28/16 1800 1,800 Units/hr at 09/28/16 1800  . LORazepam (ATIVAN) injection 0.5 mg  0.5 mg Intravenous Once Barrett, Evelene Croon, PA-C      . LORazepam (ATIVAN) tablet 0.5 mg  0.5 mg Oral Q8H PRN Barrett, Rhonda G, PA-C    0.5 mg at 09/27/16 2227  . metolazone (ZAROXOLYN) tablet 2.5 mg  2.5 mg Oral Daily Hilty, Nadean Corwin, MD   2.5 mg at 09/28/16 1194  . nitroGLYCERIN (NITROSTAT) SL tablet 0.4 mg  0.4 mg Sublingual Q5 Min x 3 PRN Lendon Colonel, NP      . ondansetron [facility]) injection 4 mg  4 mg Intravenous Q6H PRN Lendon Colonel, NP      . polyethylene glycol (MIRALAX / GLYCOLAX) packet 17 g  17 g Oral Daily PRN Pixie Casino, MD      . potassium chloride SA (K-DUR,KLOR-CON) CR tablet 20 mEq  20 mEq Oral Daily Barrett, Rhonda G, PA-C   20 mEq at 09/28/16 1740  . saccharomyces boulardii (FLORASTOR) capsule 250 mg  250 mg Oral BID Barrett, Rhonda G, PA-C   250 mg at 09/28/16 0839  . zolpidem (AMBIEN) tablet 5 mg  5 mg Oral QHS PRN Pixie Casino, MD        Allergies  Allergen Reactions  . No Known Allergies       Review of Systems:   General:  normal appetite, decreased energy, + weight gain, no weight loss, no fever  Cardiac:  + chest pain with exertion, no chest pain at rest, + SOB with exertion, + resting SOB, no PND, + orthopnea, no palpitations, no arrhythmia, no atrial fibrillation, + LE edema, no dizzy spells, no syncope  Respiratory:  + shortness of breath, no home oxygen, no productive cough, no dry cough, no bronchitis, no wheezing, no hemoptysis, no asthma, no pain with inspiration or cough, no sleep apnea, no CPAP at night  GI:   no difficulty swallowing, no reflux, no frequent heartburn, no hiatal hernia, no abdominal pain, no constipation, no diarrhea, no hematochezia, no hematemesis, no melena  GU:   no dysuria,  no frequency, no urinary tract infection, no hematuria, no enlarged prostate, no kidney stones, no kidney disease  Vascular:  no pain suggestive of claudication, no pain in feet, no leg cramps, no varicose veins, no DVT, no non-healing foot ulcer  Neuro:   no stroke, no TIA's, no seizures, no headaches, no temporary blindness one eye,  no slurred speech, no peripheral  neuropathy, no chronic pain, no instability of gait, no memory/cognitive dysfunction  Musculoskeletal: no arthritis - , no joint swelling, no myalgias, no difficulty walking, normal mobility   Skin:   no rash, no itching,  no skin infections, no pressure sores or ulcerations  Psych:   no anxiety, no depression, no nervousness, no unusual recent stress  Eyes:   no blurry vision, no floaters, no recent vision changes, + wears glasses or contacts  ENT:   no hearing loss, no loose or painful teeth, partial dentures, last saw dentist within last month  Hematologic:  no easy bruising, no abnormal bleeding, no clotting disorder, no frequent epistaxis  Endocrine:  no diabetes, does not check CBG's at home     Physical Exam:   BP 109/83 (BP Location: Right Arm)   Pulse 83   Temp 97.7 F (36.5 C) (Oral)   Resp 18   Ht 6' (1.829 m)   Wt 231 lb (104.8 kg)   SpO2 95%   BMI 31.33 kg/m   General:  moderately obese,  well-appearing  HEENT:  Unremarkable   Neck:   no JVD, no bruits, no adenopathy   Chest:   Bibasilar insp crackles but o/w clear to auscultation, symmetrical breath sounds, no wheezes, no rhonchi   CV:   RRR, prominent to and fro diastolic murmur   Abdomen:  soft, non-tender, no masses   Extremities:  warm, well-perfused, pulses palpable, trace lower extremity edema  Rectal/GU  Deferred  Neuro:   Grossly non-focal and symmetrical throughout  Skin:   Clean and dry, no rashes, no breakdown  Diagnostic Tests:  Images from transthoracic echocardiogram performed earlier today have been reviewed. Official report remains pending. The patient has severe aortic insufficiency. Left ventricle is mildly dilated but left ventricular systolic function remains preserved. There is at least mild mitral regurgitation.   Impression:  Patient has stage D severe symptomatic aortic insufficiency and single-vessel coronary artery disease. He originally presented with bacterial endocarditis several  months ago in the setting of likely bicuspid aortic valve with some degree of pre-existing aortic insufficiency. Previous transthoracic and transesophageal echocardiograms confirmed the presence of severe aortic insufficiency with possible perforation of the aortic valve and diagnostic cardiac catheterization revealed 70% ostial stenosis of the right coronary artery with otherwise insignificant nonobstructive coronary artery disease.  CT angiogram of the chest revealed mild aneurysmal enlargement of the proximal ascending thoracic aorta. The patient was encouraged to consider elective aortic valve replacement, coronary artery bypass grafting, and resection and grafting of the ascending thoracic aorta, but he was reluctant to proceed.   Although he remained quite stable from a cardiac standpoint until recently, he has now developed fairly acute onset of class IV congestive heart failure with mildly elevated cardiac enzymes at the time of admission.  There is no question that surgery is indicated and should probably be performed sooner rather than later.  However, he still appears a bit wet on physical exam and may benefit from further diuresis prior to surgery.   Plan:  I have discussed matters at length with the patient at the bedside this evening.  At this point the earliest date on my surgical schedule is Thursday 10/08/2016 (next week), and it is unclear whether not this could be altered.  We will reevaluate whether not this could be changed and continue to follow closely over the next couple of days to see how the patient does on medical therapy. All of his questions have been addressed.   I spent in excess of 45 minutes during the conduct of this hospital consultation and >50% of this time involved direct face-to-face encounter for counseling and/or coordination of the patient's care.   Valentina Gu. Roxy Manns, MD  09/28/2016 7:17 PM

## 2016-09-28 NOTE — Progress Notes (Signed)
Pharmacy Antibiotic Note Charles [name] is a 71 y.o. male admitted on 09/24/2016 with bilateral lower extremity cellulitis/bug bites. Hx of streptococcal bacteremia.  Currently on Unasyn  Plan: 1. Adjust Unasyn to 3 grams IV every 8 hours 2. Following along with you daily   Height: 6' (182.9 cm) Weight: 231 lb (104.8 kg) IBW/kg (Calculated) : 77.6  Temp (24hrs), Avg:97.5 F (36.4 C), Min:97 F (36.1 C), Max:97.9 F (36.6 C)   Recent Labs Lab 09/24/16 0715 09/24/16 1834 09/25/16 0412 09/26/16 0346 09/27/16 0310 09/27/16 0849 09/28/16 0218  WBC 10.2  --  11.2*  --  11.3*  --  9.1  CREATININE 1.25* 1.47* 1.34* 1.49*  --  1.47* 1.49*    Estimated Creatinine Clearance: 58.6 mL/min (A) (by C-G formula based on SCr of 1.49 mg/dL (H)).    Allergies  Allergen Reactions  . No Known Allergies     Thank you for allowing pharmacy to be a part of this patient's care.  Vincenza Hews, PharmD, BCPS 09/28/2016, 8:37 AM

## 2016-09-28 NOTE — Progress Notes (Signed)
Progress Note  Patient Name: Charles [name] Date of Encounter: 09/28/2016  Primary Cardiologist: Dr. Domenic Polite  Subjective   Breathing better, still not at baseline. Had more SOB around 12-3AM when he tries to go to sleep. Chest discomfort when walk for > 1 min.  Inpatient Medications    Scheduled Meds: . aspirin EC  81 mg Oral Daily  . atorvastatin  80 mg Oral q1800  . chlorhexidine  15 mL Mouth/Throat QID  . feeding supplement (ENSURE ENLIVE)  237 mL Oral BID BM  . ferrous sulfate  325 mg Oral Q breakfast  . furosemide  80 mg Intravenous BID  . LORazepam  0.5 mg Intravenous Once  . metolazone  2.5 mg Oral Daily  . potassium chloride  20 mEq Oral Daily  . saccharomyces boulardii  250 mg Oral BID   Continuous Infusions: . ampicillin-sulbactam (UNASYN) IV Stopped (09/28/16 0453)  . heparin 1,800 Units/hr (09/28/16 0426)   PRN Meds: acetaminophen, LORazepam, nitroGLYCERIN, ondansetron (ZOFRAN) IV, polyethylene glycol, zolpidem   Vital Signs    Vitals:   09/27/16 0700 09/27/16 1608 09/27/16 1931 09/28/16 0430  BP: (!) 98/35 (!) 109/29 (!) 110/37 (!) 102/39  Pulse: 81 82 86   Resp: (!) 21 19 20    Temp: 97.8 F (36.6 C) 97.9 F (36.6 C) 97 F (36.1 C)   TempSrc: Oral Oral    SpO2: 98% 94% 96%   Weight:    231 lb (104.8 kg)  Height:        Intake/Output Summary (Last 24 hours) at 09/28/16 0746 Last data filed at 09/28/16 0423  Gross per 24 hour  Intake              720 ml  Output             1250 ml  Net             -530 ml   Filed Weights   09/26/16 0448 09/27/16 0346 09/28/16 0430  Weight: 232 lb 11.2 oz (105.6 kg) 231 lb 1.6 oz (104.8 kg) 231 lb (104.8 kg)    Telemetry    NSR with HR 80, no significant ventricular ectopy - Personally Reviewed  ECG    EKG 09/26/2016 NSR, LVH with T wave inversion lead I and aVL- Personally Reviewed  Physical Exam   GEN: No acute distress.   Neck: No JVD Cardiac: RRR, no rubs, or gallops. No edema in the leg.  3/6  murmur at RUSB Respiratory: Markedly diminished breath sound in bilateral bases, R worse than L GI: Soft, nontender, non-distended  MS: No edema; No deformity. Neuro:  Nonfocal  Psych: Normal affect   Labs    Chemistry Recent Labs Lab 09/24/16 0715  09/26/16 0346 09/27/16 0849 09/28/16 0218  NA 138  < > 135 138 138  K 3.6  < > 3.6 3.5 3.1*  CL 105  < > 105 104 99*  CO2 24  < > 21* 21* 29  GLUCOSE 111*  < > 163* 177* 107*  BUN 23*  < > 36* 44* 45*  CREATININE 1.25*  < > 1.49* 1.47* 1.49*  CALCIUM 8.5*  < > 8.4* 8.7* 8.6*  PROT 7.1  --   --   --   --   ALBUMIN 3.5  --   --   --   --   AST 18  --   --   --   --   ALT 11*  --   --   --   --  ALKPHOS 108  --   --   --   --   BILITOT 0.5  --   --   --   --   GFRNONAA 57*  < > 46* 47* 46*  GFRAA >60  < > 53* 54* 53*  ANIONGAP 9  < > 9 13 10   < > = values in this interval not displayed.   Hematology Recent Labs Lab 09/25/16 0412 09/27/16 0310 09/28/16 0218  WBC 11.2* 11.3* 9.1  RBC 4.83 4.46 4.61  HGB 12.2* 11.2* 11.5*  HCT 39.1 36.2* 37.2*  MCV 81.0 81.2 80.7  MCH 25.3* 25.1* 24.9*  MCHC 31.2 30.9 30.9  RDW 16.5* 16.6* 16.5*  PLT 222 206 225    Cardiac Enzymes Recent Labs Lab 09/24/16 0715 09/24/16 1036  TROPONINI 0.25* 0.85*   No results for input(s): TROPIPOC in the last 168 hours.   BNP Recent Labs Lab 09/24/16 0715  BNP 253.0*     DDimer No results for input(s): DDIMER in the last 168 hours.   Radiology    No results found.  Cardiac Studies   Echo 07/03/2016 - Left ventricle: The cavity size was mildly dilated. Wall   thickness was increased in a pattern of mild LVH. Systolic   function was normal. The estimated ejection fraction was 55%.   Probable hypokinesis of the apicalinferior myocardium. Doppler   parameters are consistent with abnormal left ventricular   relaxation (grade 1 diastolic dysfunction). Doppler parameters   are consistent with high ventricular filling pressure. -  Aortic valve: Bicuspid; moderately thickened leaflets.   Intermittently noted small echodensities seen near leaflet tips   in the parasternal views, although not well in other views.   Suspicious for vegetations particularly in light of bacteremia   although TEE is indicated for further assessment, and also to   exclude any associated abscess in light of fairly peripheral   aortic regurgitation. There was moderate regurgitation. Mean   gradient (S): 5 mm Hg. Peak gradient (S): 8 mm Hg. Regurgitation   pressure half-time: 383 ms. - Aorta: Aortic root dimension: 50 mm (ED). - Aortic root: The aortic root was moderately dilated. - Mitral valve: Calcified annulus. There was mild regurgitation. - Right atrium: Echodensity noted near the septum and tricuspid   valve plane, best seen in the apical views but not in the   subcostal views. Suspect artifact or perhaps visualization of   compressed free wall, although in light of bacteremia would   suggest further imaging with TEE to exclude other pathology.   Central venous pressure (est): 8 mm Hg. - Tricuspid valve: There was trivial regurgitation. - Pulmonary arteries: PA peak pressure: 28 mm Hg (S). - Pericardium, extracardiac: There was no pericardial effusion.  Impressions:  - Mildly dilated left ventricle with mild LVH and LVEF   approximately 55%. Probable hypokinesis of the apical inferior   wall. Grade 1 diastolic dysfunction with increased filling   pressures. Calcified mitral annulus with mild mitral   regurgitation. Aortic valve is bicuspid and moderately thickened.   Intermittently noted small echodensities seen near leaflet tips   in the parasternal views, although not well in other views.   Suspicious for vegetations particularly in light of bacteremia   although TEE is indicated for further assessment, and also to   exclude any associated abscess in light of fairly peripheral   aortic regurgitation. Aortic root is moderately  dilated, measures   up to 50 mm. Trivial tricuspid regurgitation with PASP estimated  28 mmHg. Echodensity noted in right atrium near the septum and   tricuspid valve plane, best seen in the apical views but not in   the subcostal views. Suspect artifact or perhaps visualization of   compressed free wall, although in light of bacteremia would   suggest further imaging with TEE to exclude other pathology.   Results discussed with Dr. Jerilee Hoh.  TEE 07/07/2016 - Left ventricle: The cavity size was moderately dilated. Wall   thickness was normal. Systolic function was normal. The estimated   ejection fraction was in the range of 60% to 65%. Wall motion was   normal; there were no regional wall motion abnormalities. - Aortic valve: The aortic valve was bicuspid. There was eccentric   aortic insufficiency that appeared severe. There was no aortic   stenosis. No definite vegetation seen. There was a small area of   thickening with possible potential space at the valve base   adjacent to the RV. The AI appeared to originate from this area.   However, I am not convinced this is a vegetation or abscess. - Aorta: The aortic root was dilated to to 4.7 cm with a 4.4 cm   ascending aorta. Diastolic flow reversal noted in the ascending   thoracic aorta. - Mitral valve: No evidence of vegetation. There was mild   regurgitation. - Left atrium: No evidence of thrombus in the atrial cavity or   appendage. - Right ventricle: The cavity size was normal. Systolic function   was normal. - Right atrium: No evidence of thrombus in the atrial cavity or   appendage. - Atrial septum: No defect or patent foramen ovale was identified. - Tricuspid valve: No evidence of vegetation. - Pulmonic valve: No evidence of vegetation.  Impressions:  - 1. Moderately dilated LV with normal systolic function.   2. Bicuspid aortic valve with severe eccentric aortic   insufficiency. No definite infection seen.   3.  Moderately dilated aortic root and ascending aorta.   Cath 08/03/2016  Conclusion     Ost RCA lesion, 70 %stenosed.  Prox Cx to Mid Cx lesion, 30 %stenosed.  Mid LAD lesion, 20 %stenosed.  Ost 2nd Diag lesion, 20 %stenosed.  Dist LAD lesion, 40 %stenosed.  LV end diastolic pressure is mildly elevated.   1. Severe stenosis ostium of the large dominant RCA. There is dampening of the pressure with catheter engagement of this vessel suggesting the stenosis is severe.  2. Mild disease in the Circumflex and LAD 3. Mildly elevated LV filling pressure 4. Dilated ascending aorta.   Recommendation: He will continue planning for AVR. He will need AVR and bypass of the RCA with special attention paid to his ascending aorta. He has follow up in 2 weeks with Dr. Roxy Manns.        Patient Profile     71 y.o. male with a hx of subacute bacterial endocarditis, severe non-rheumatic AoV regurgitation being considered for AoV replacement/ repair, bicuspid AoV, CAD with 70% RCA disease, and mild non-obstructive disease elsewhere presented to [facility] with dyspnea, CHF, pericarditis. Recently placed on PRN lasix.    Assessment & Plan    1. Acute on chronic diastolic heart failure: weight has not changed much. No LE edema. Decreased breath sound R > L. Would favor diurese 1 more day with IV lasix and metolazone. BP borderline today, unable to add any BB given active HF and low BP, need close attention to renal function  2. Severe non-rheumatic AR with bicuspid valve:  evaluated by Dr Roxy Manns before, initially wish to hold off on surgery and CABG as he was asymptomatic. Now he is having more symptomatic severe AR, plan to contact Dr. Guy Sandifer office today and have him seen. I discussed with CT surgery PA, recommended call Dr. Guy Sandifer office after 9AM.   3. Ascending aortic aneurysm: 4.4 cm, will defer to Dr. Roxy Manns regarding aneurysm repair at the time of surgery  4. CAD: cath 08/03/2016 showed 70% ost  RCA, otherwise nonobstructive CAD elsewhere. CABG at the time of valve repair  5. Bilateral chigger bites in lower extremity: given Abx  6. H/o SBE  Signed, Almyra Deforest, [place]  09/28/2016, 7:47 AM   As above, patient seen and examined. Patient states he continues to have orthopnea and PND. His symptoms have improved since admission. He notices chest pain with exertion. Patient is not febrile. I have personally reviewed the patient's prior transesophageal echocardiogram and chest x-ray.  His congestive heart failure is likely secondary to severe aortic insufficiency. We will ask Dr. Roxy Manns to see as pt now ready to consider aortic root replacement/AVR/CABG. Will arrange repeat echocardiogram to reassess LV function. Will continue present dose of diuretics today. Follow renal function closely.  Kirk Ruths, MD

## 2016-09-28 NOTE — Care Management Important Message (Signed)
Important Message  Patient Details  Name: Charles [name] MRN: [id number] Date of Birth: 29-Sep-1945   Medicare Important Message Given:  Yes    Nathen May 09/28/2016, 10:57 AM

## 2016-09-28 NOTE — Progress Notes (Signed)
Called CT surgery, Dr. Guy Sandifer office is aware, he will see the patient today. Dr. Roxy Manns seems to be in the clinic.  Hilbert Corrigan PA Pager: [phone]

## 2016-09-29 ENCOUNTER — Inpatient Hospital Stay (HOSPITAL_COMMUNITY): Payer: Medicare Other

## 2016-09-29 ENCOUNTER — Other Ambulatory Visit: Payer: Self-pay | Admitting: *Deleted

## 2016-09-29 DIAGNOSIS — I351 Nonrheumatic aortic (valve) insufficiency: Secondary | ICD-10-CM

## 2016-09-29 DIAGNOSIS — I2510 Atherosclerotic heart disease of native coronary artery without angina pectoris: Secondary | ICD-10-CM

## 2016-09-29 LAB — PULMONARY FUNCTION TEST
DL/VA % pred: 98 %
DL/VA: 4.64 ml/min/mmHg/L
DLCO cor % pred: 49 %
DLCO cor: 17.36 ml/min/mmHg
DLCO unc % pred: 45 %
DLCO unc: 15.94 ml/min/mmHg
FEF 25-75 Post: 1.1 L/sec
FEF 25-75 Pre: 0.94 L/sec
FEF2575-%Change-Post: 17 %
FEF2575-%Pred-Post: 40 %
FEF2575-%Pred-Pre: 34 %
FEV1-%Change-Post: 6 %
FEV1-%Pred-Post: 51 %
FEV1-%Pred-Pre: 48 %
FEV1-Post: 1.82 L
FEV1-Pre: 1.71 L
FEV1FVC-%Change-Post: 13 %
FEV1FVC-%Pred-Pre: 91 %
FEV6-%Change-Post: -4 %
FEV6-%Pred-Post: 52 %
FEV6-%Pred-Pre: 54 %
FEV6-Post: 2.37 L
FEV6-Pre: 2.48 L
FEV6FVC-%Change-Post: 1 %
FEV6FVC-%Pred-Post: 105 %
FEV6FVC-%Pred-Pre: 103 %
FVC-%Change-Post: -6 %
FVC-%Pred-Post: 49 %
FVC-%Pred-Pre: 52 %
FVC-Post: 2.37 L
FVC-Pre: 2.52 L
Post FEV1/FVC ratio: 77 %
Post FEV6/FVC ratio: 100 %
Pre FEV1/FVC ratio: 68 %
Pre FEV6/FVC Ratio: 98 %
RV % pred: 74 %
RV: 1.89 L
TLC % pred: 58 %
TLC: 4.31 L

## 2016-09-29 LAB — HEPARIN LEVEL (UNFRACTIONATED): HEPARIN UNFRACTIONATED: 0.53 [IU]/mL (ref 0.30–0.70)

## 2016-09-29 LAB — COMPREHENSIVE METABOLIC PANEL
ALT: 13 U/L — ABNORMAL LOW (ref 17–63)
ANION GAP: 16 — AB (ref 5–15)
AST: 18 U/L (ref 15–41)
Albumin: 3.1 g/dL — ABNORMAL LOW (ref 3.5–5.0)
Alkaline Phosphatase: 96 U/L (ref 38–126)
BUN: 51 mg/dL — ABNORMAL HIGH (ref 6–20)
CHLORIDE: 97 mmol/L — AB (ref 101–111)
CO2: 25 mmol/L (ref 22–32)
Calcium: 8.4 mg/dL — ABNORMAL LOW (ref 8.9–10.3)
Creatinine, Ser: 1.43 mg/dL — ABNORMAL HIGH (ref 0.61–1.24)
GFR calc non Af Amer: 48 mL/min — ABNORMAL LOW (ref >60)
GFR, EST AFRICAN AMERICAN: 56 mL/min — AB (ref >60)
Glucose, Bld: 106 mg/dL — ABNORMAL HIGH (ref 65–99)
Potassium: 3 mmol/L — ABNORMAL LOW (ref 3.5–5.1)
SODIUM: 138 mmol/L (ref 135–145)
Total Bilirubin: 0.8 mg/dL (ref 0.3–1.2)
Total Protein: 6.4 g/dL — ABNORMAL LOW (ref 6.5–8.1)

## 2016-09-29 LAB — URINALYSIS, COMPLETE (UACMP) WITH MICROSCOPIC
BACTERIA UA: NONE SEEN
BILIRUBIN URINE: NEGATIVE
Glucose, UA: NEGATIVE mg/dL
HGB URINE DIPSTICK: NEGATIVE
KETONES UR: NEGATIVE mg/dL
Leukocytes, UA: NEGATIVE
NITRITE: NEGATIVE
Protein, ur: NEGATIVE mg/dL
RBC / HPF: NONE SEEN RBC/hpf (ref 0–5)
SPECIFIC GRAVITY, URINE: 1.008 (ref 1.005–1.030)
Squamous Epithelial / LPF: NONE SEEN
WBC, UA: NONE SEEN WBC/hpf (ref 0–5)
pH: 6 (ref 5.0–8.0)

## 2016-09-29 LAB — CULTURE, BLOOD (ROUTINE X 2)
Culture: NO GROWTH
Culture: NO GROWTH
SPECIAL REQUESTS: ADEQUATE
SPECIAL REQUESTS: ADEQUATE

## 2016-09-29 LAB — CBC
HCT: 37.7 % — ABNORMAL LOW (ref 39.0–52.0)
Hemoglobin: 12 g/dL — ABNORMAL LOW (ref 13.0–17.0)
MCH: 25.6 pg — ABNORMAL LOW (ref 26.0–34.0)
MCHC: 31.8 g/dL (ref 30.0–36.0)
MCV: 80.4 fL (ref 78.0–100.0)
Platelets: 219 10*3/uL (ref 150–400)
RBC: 4.69 MIL/uL (ref 4.22–5.81)
RDW: 16.4 % — ABNORMAL HIGH (ref 11.5–15.5)
WBC: 10.8 10*3/uL — ABNORMAL HIGH (ref 4.0–10.5)

## 2016-09-29 LAB — PROTIME-INR
INR: 1.1
PROTHROMBIN TIME: 14.3 s (ref 11.4–15.2)

## 2016-09-29 LAB — PREALBUMIN: PREALBUMIN: 27.3 mg/dL (ref 18–38)

## 2016-09-29 LAB — SURGICAL PCR SCREEN
MRSA, PCR: NEGATIVE
Staphylococcus aureus: NEGATIVE

## 2016-09-29 LAB — APTT: aPTT: 80 seconds — ABNORMAL HIGH (ref 24–36)

## 2016-09-29 MED ORDER — ALBUTEROL SULFATE (2.5 MG/3ML) 0.083% IN NEBU
2.5000 mg | INHALATION_SOLUTION | Freq: Once | RESPIRATORY_TRACT | Status: AC
  Administered 2016-09-29: 2.5 mg via RESPIRATORY_TRACT

## 2016-09-29 MED ORDER — POTASSIUM CHLORIDE CRYS ER 20 MEQ PO TBCR
40.0000 meq | EXTENDED_RELEASE_TABLET | Freq: Once | ORAL | Status: AC
  Administered 2016-09-29: 40 meq via ORAL
  Filled 2016-09-29: qty 2

## 2016-09-29 MED ORDER — POTASSIUM CHLORIDE CRYS ER 20 MEQ PO TBCR
40.0000 meq | EXTENDED_RELEASE_TABLET | Freq: Two times a day (BID) | ORAL | Status: DC
  Administered 2016-09-29 – 2016-10-01 (×6): 40 meq via ORAL
  Filled 2016-09-29 (×5): qty 2

## 2016-09-29 NOTE — Progress Notes (Signed)
      [address]       [place] [address]             [phone]     CARDIOTHORACIC SURGERY PROGRESS NOTE  Subjective: Feels better but still gets SOB and chest discomfort just walking to bathroom  Objective: Vital signs in last 24 hours: Temp:  [97.8 F (36.6 C)-97.9 F (36.6 C)] 97.8 F (36.6 C) (06/19 1719) Pulse Rate:  [72-83] 72 (06/19 1719) Cardiac Rhythm: Normal sinus rhythm;Heart block;Bundle branch block (06/19 0700) Resp:  [17-22] 22 (06/19 1719) BP: (90-113)/(25-83) 108/48 (06/19 1719) SpO2:  [93 %-95 %] 95 % (06/19 1719) Weight:  [229 lb 9.6 oz (104.1 kg)] 229 lb 9.6 oz (104.1 kg) (06/19 0459)  Physical Exam:  Rhythm:   sinus  Breath sounds: Bibasilar inspiratory crackles  Heart sounds:  RRR w/ loud to and fro diastolic murmur  Incisions:  n/a  Abdomen:  soft  Extremities:  warm   Intake/Output from previous day: 06/18 0701 - 06/19 0700 In: 1130 [P.O.:1080; IV Piggyback:50] Out: 2450 [Urine:2450] Intake/Output this shift: Total I/O In: 1640.4 [P.O.:120; I.V.:1370.4; IV Piggyback:150] Out: 800 [Urine:800]  Lab Results:  Recent Labs  09/28/16 0218 09/29/16 0500  WBC 9.1 10.8*  HGB 11.5* 12.0*  HCT 37.2* 37.7*  PLT 225 219   BMET:  Recent Labs  09/28/16 0218 09/29/16 0314  NA 138 138  K 3.1* 3.0*  CL 99* 97*  CO2 29 25  GLUCOSE 107* 106*  BUN 45* 51*  CREATININE 1.49* 1.43*  CALCIUM 8.6* 8.4*    CBG (last 3)  No results for input(s): GLUCAP in the last 72 hours. PT/INR:   Recent Labs  09/29/16 0314  LABPROT 14.3  INR 1.10    CXR:  CHEST  2 VIEW  COMPARISON:  Portable chest x-ray of September 24, 2016  FINDINGS: The lungs are well-expanded. The interstitial markings are increased bilaterally. There are bilateral pleural effusions blunting the costophrenic angles. Patchy increased density is noted at both bases and in the right upper lobe. The heart is enlarged. The pulmonary vascularity is engorged. There  is calcification in the wall of the thoracic aorta. The bony thorax is unremarkable.  IMPRESSION: CHF with pulmonary interstitial and alveolar edema. Small bilateral pleural effusions. Bibasilar subsegmental atelectasis.  Thoracic aortic atherosclerosis.   Electronically Signed   By: David  Martinique M.D.   On: 09/29/2016 07:14  Assessment/Plan:  Charles [name] remains tenuous w/ acute exacerbation of CHF complicated by acute on chronic renal insufficiency, likely due to prerenal azotemia.  I think he needs surgery sooner rather than later.  We have rearranged the OR schedule and now plan to proceed with surgery on Friday 10/02/2016.  Charles [name] understands and is looking forward to having surgery completed.  All questions answered.   I spent in excess of 15 minutes during the conduct of this hospital encounter and >50% of this time involved direct face-to-face encounter with the patient for counseling and/or coordination of their care.   [name], MD 09/29/2016 6:22 PM

## 2016-09-29 NOTE — Progress Notes (Signed)
ANTICOAGULATION CONSULT NOTE - Follow Up Consult  Pharmacy Consult for Heparin Indication: NSTEMI  Allergies  Allergen Reactions  . No Known Allergies     Patient Measurements: Height: 6' (182.9 cm) Weight: 229 lb 9.6 oz (104.1 kg) IBW/kg (Calculated) : 77.6 Heparin dosing wt: 99 kg  Vital Signs: Temp: 97.9 F (36.6 C) (06/19 0737) Temp Source: Oral (06/19 0737) BP: 90/25 (06/19 0737) Pulse Rate: 77 (06/19 0737)  Labs:  Recent Labs  09/27/16 0310 09/27/16 0849 09/28/16 0218 09/29/16 0314 09/29/16 0500  HGB 11.2*  --  11.5*  --  12.0*  HCT 36.2*  --  37.2*  --  37.7*  PLT 206  --  225  --  219  APTT  --   --   --  80*  --   LABPROT  --   --   --  14.3  --   INR  --   --   --  1.10  --   HEPARINUNFRC 0.40  --  0.52 0.53  --   CREATININE  --  1.47* 1.49* 1.43*  --     Estimated Creatinine Clearance: 60.8 mL/min (A) (by C-G formula based on SCr of 1.43 mg/dL (H)).  Assessment: 19 YOM on heparin for stage D severe symptomatic aortic insufficiency and single-vessel coronary artery disease. Heparin level remains therapeutic. CBC stable. CTS to evaluate today.   Goal of Therapy:  Heparin level 0.3-0.7 units/ml Monitor platelets by anticoagulation protocol: Yes   Plan:  1. Continue heparin gtt at 1800 units/hr 2. Heparin level and CBC daily 3. Monitor for s/s bleeding   Vincenza Hews, PharmD, BCPS 09/29/2016, 8:00 AM Phone# [phone]

## 2016-09-29 NOTE — Progress Notes (Signed)
Progress Note  Patient Name: Charles [name] Date of Encounter: 09/29/2016  Primary Cardiologist: Dr. Domenic Polite  Subjective   Still with dyspnea but improving; some chest pain with exertion  Inpatient Medications    Scheduled Meds: . aspirin EC  81 mg Oral Daily  . atorvastatin  80 mg Oral q1800  . chlorhexidine  15 mL Mouth/Throat QID  . feeding supplement (ENSURE ENLIVE)  237 mL Oral BID BM  . ferrous sulfate  325 mg Oral Q breakfast  . furosemide  80 mg Intravenous BID  . LORazepam  0.5 mg Intravenous Once  . metolazone  2.5 mg Oral Daily  . potassium chloride  40 mEq Oral BID  . saccharomyces boulardii  250 mg Oral BID   Continuous Infusions: . cefTRIAXone (ROCEPHIN)  IV Stopped (09/28/16 2312)  . heparin 1,800 Units/hr (09/28/16 1800)  . vancomycin 750 mg (09/29/16 0942)   PRN Meds: acetaminophen, LORazepam, nitroGLYCERIN, ondansetron (ZOFRAN) IV, polyethylene glycol, zolpidem   Vital Signs    Vitals:   09/29/16 0047 09/29/16 0459 09/29/16 0737 09/29/16 0800  BP: (!) 113/45 105/83 (!) 90/25 100/60  Pulse:   77   Resp:  20 (!) 21   Temp:   97.9 F (36.6 C)   TempSrc:   Oral   SpO2:  94% 93%   Weight:  104.1 kg (229 lb 9.6 oz)    Height:        Intake/Output Summary (Last 24 hours) at 09/29/16 1111 Last data filed at 09/29/16 0942  Gross per 24 hour  Intake              650 ml  Output             1950 ml  Net            -1300 ml   Filed Weights   09/27/16 0346 09/28/16 0430 09/29/16 0459  Weight: 104.8 kg (231 lb 1.6 oz) 104.8 kg (231 lb) 104.1 kg (229 lb 9.6 oz)    Telemetry    NSR, PAT, NSVT- Personally Reviewed   Physical Exam   GEN: WD/WN, NAD Neck: Supple Cardiac: RRR, 2/6 systolic and diastolic murmur LSB Respiratory: mild basilar crackles GI: Soft, nontender, non-distended, no masses  MS: No edema Neuro:  Grossly intact   Labs    Chemistry Recent Labs Lab 09/24/16 0715  09/27/16 0849 09/28/16 0218 09/29/16 0314  NA 138   < > 138 138 138  K 3.6  < > 3.5 3.1* 3.0*  CL 105  < > 104 99* 97*  CO2 24  < > 21* 29 25  GLUCOSE 111*  < > 177* 107* 106*  BUN 23*  < > 44* 45* 51*  CREATININE 1.25*  < > 1.47* 1.49* 1.43*  CALCIUM 8.5*  < > 8.7* 8.6* 8.4*  PROT 7.1  --   --   --  6.4*  ALBUMIN 3.5  --   --   --  3.1*  AST 18  --   --   --  18  ALT 11*  --   --   --  13*  ALKPHOS 108  --   --   --  96  BILITOT 0.5  --   --   --  0.8  GFRNONAA 57*  < > 47* 46* 48*  GFRAA >60  < > 54* 53* 56*  ANIONGAP 9  < > 13 10 16*  < > = values in this interval not displayed.  Hematology  Recent Labs Lab 09/27/16 0310 09/28/16 0218 09/29/16 0500  WBC 11.3* 9.1 10.8*  RBC 4.46 4.61 4.69  HGB 11.2* 11.5* 12.0*  HCT 36.2* 37.2* 37.7*  MCV 81.2 80.7 80.4  MCH 25.1* 24.9* 25.6*  MCHC 30.9 30.9 31.8  RDW 16.6* 16.5* 16.4*  PLT 206 225 219    Cardiac Enzymes  Recent Labs Lab 09/24/16 0715 09/24/16 1036  TROPONINI 0.25* 0.85*    BNP  Recent Labs Lab 09/24/16 0715  BNP 253.0*       Radiology    Dg Chest 2 View  Result Date: 09/29/2016 CLINICAL DATA:  Shortness of breath. EXAM: CHEST  2 VIEW COMPARISON:  Portable chest x-ray of September 24, 2016 FINDINGS: The lungs are well-expanded. The interstitial markings are increased bilaterally. There are bilateral pleural effusions blunting the costophrenic angles. Patchy increased density is noted at both bases and in the right upper lobe. The heart is enlarged. The pulmonary vascularity is engorged. There is calcification in the wall of the thoracic aorta. The bony thorax is unremarkable. IMPRESSION: CHF with pulmonary interstitial and alveolar edema. Small bilateral pleural effusions. Bibasilar subsegmental atelectasis. Thoracic aortic atherosclerosis. Electronically Signed   By: David  Martinique M.D.   On: 09/29/2016 07:14    Cardiac Studies   Echo 07/03/2016 - Left ventricle: The cavity size was mildly dilated. Wall   thickness was increased in a pattern of mild  LVH. Systolic   function was normal. The estimated ejection fraction was 55%.   Probable hypokinesis of the apicalinferior myocardium. Doppler   parameters are consistent with abnormal left ventricular   relaxation (grade 1 diastolic dysfunction). Doppler parameters   are consistent with high ventricular filling pressure. - Aortic valve: Bicuspid; moderately thickened leaflets.   Intermittently noted small echodensities seen near leaflet tips   in the parasternal views, although not well in other views.   Suspicious for vegetations particularly in light of bacteremia   although TEE is indicated for further assessment, and also to   exclude any associated abscess in light of fairly peripheral   aortic regurgitation. There was moderate regurgitation. Mean   gradient (S): 5 mm Hg. Peak gradient (S): 8 mm Hg. Regurgitation   pressure half-time: 383 ms. - Aorta: Aortic root dimension: 50 mm (ED). - Aortic root: The aortic root was moderately dilated. - Mitral valve: Calcified annulus. There was mild regurgitation. - Right atrium: Echodensity noted near the septum and tricuspid   valve plane, best seen in the apical views but not in the   subcostal views. Suspect artifact or perhaps visualization of   compressed free wall, although in light of bacteremia would   suggest further imaging with TEE to exclude other pathology.   Central venous pressure (est): 8 mm Hg. - Tricuspid valve: There was trivial regurgitation. - Pulmonary arteries: PA peak pressure: 28 mm Hg (S). - Pericardium, extracardiac: There was no pericardial effusion.  Impressions:  - Mildly dilated left ventricle with mild LVH and LVEF   approximately 55%. Probable hypokinesis of the apical inferior   wall. Grade 1 diastolic dysfunction with increased filling   pressures. Calcified mitral annulus with mild mitral   regurgitation. Aortic valve is bicuspid and moderately thickened.   Intermittently noted small echodensities  seen near leaflet tips   in the parasternal views, although not well in other views.   Suspicious for vegetations particularly in light of bacteremia   although TEE is indicated for further assessment, and also to   exclude  any associated abscess in light of fairly peripheral   aortic regurgitation. Aortic root is moderately dilated, measures   up to 50 mm. Trivial tricuspid regurgitation with PASP estimated   28 mmHg. Echodensity noted in right atrium near the septum and   tricuspid valve plane, best seen in the apical views but not in   the subcostal views. Suspect artifact or perhaps visualization of   compressed free wall, although in light of bacteremia would   suggest further imaging with TEE to exclude other pathology.   Results discussed with Dr. Jerilee Hoh.  TEE 07/07/2016 - Left ventricle: The cavity size was moderately dilated. Wall   thickness was normal. Systolic function was normal. The estimated   ejection fraction was in the range of 60% to 65%. Wall motion was   normal; there were no regional wall motion abnormalities. - Aortic valve: The aortic valve was bicuspid. There was eccentric   aortic insufficiency that appeared severe. There was no aortic   stenosis. No definite vegetation seen. There was a small area of   thickening with possible potential space at the valve base   adjacent to the RV. The AI appeared to originate from this area.   However, I am not convinced this is a vegetation or abscess. - Aorta: The aortic root was dilated to to 4.7 cm with a 4.4 cm   ascending aorta. Diastolic flow reversal noted in the ascending   thoracic aorta. - Mitral valve: No evidence of vegetation. There was mild   regurgitation. - Left atrium: No evidence of thrombus in the atrial cavity or   appendage. - Right ventricle: The cavity size was normal. Systolic function   was normal. - Right atrium: No evidence of thrombus in the atrial cavity or   appendage. - Atrial septum:  No defect or patent foramen ovale was identified. - Tricuspid valve: No evidence of vegetation. - Pulmonic valve: No evidence of vegetation.  Impressions:  - 1. Moderately dilated LV with normal systolic function.   2. Bicuspid aortic valve with severe eccentric aortic   insufficiency. No definite infection seen.   3. Moderately dilated aortic root and ascending aorta.   Cath 08/03/2016  Conclusion     Ost RCA lesion, 70 %stenosed.  Prox Cx to Mid Cx lesion, 30 %stenosed.  Mid LAD lesion, 20 %stenosed.  Ost 2nd Diag lesion, 20 %stenosed.  Dist LAD lesion, 40 %stenosed.  LV end diastolic pressure is mildly elevated.   1. Severe stenosis ostium of the large dominant RCA. There is dampening of the pressure with catheter engagement of this vessel suggesting the stenosis is severe.  2. Mild disease in the Circumflex and LAD 3. Mildly elevated LV filling pressure 4. Dilated ascending aorta.   Recommendation: He will continue planning for AVR. He will need AVR and bypass of the RCA with special attention paid to his ascending aorta. He has follow up in 2 weeks with Dr. Roxy Manns.        Patient Profile     71 y.o. male with a hx of subacute bacterial endocarditis, severe non-rheumatic AoV regurgitation being considered for AoV replacement/ repair, bicuspid AoV, CAD with 70% RCA disease, and mild non-obstructive disease elsewhere presented to [facility] with dyspnea, CHF.   Assessment & Plan    1. Acute on chronic diastolic heart failure: Patient is symptomatically improved. I/O - 1320. Will continue present dose of Lasix but discontinue metolazone. His renal function is deteriorating. His CHF is secondary to severe aortic  insufficiency.  2. Severe non-rheumatic AR with bicuspid valve: Patient has been seen by Dr. Roxy Manns. He will require aortic valve replacement, aortic root replacement and coronary artery bypassing graft. We cannot add afterload reducing medications as his  blood pressure is borderline.  3. Ascending aortic aneurysm: Aortic root replacement at time of surgery.  4. CAD: cath 08/03/2016 showed 70% ost RCA, otherwise nonobstructive CAD elsewhere. CABG at the time of valve repair  5. H/o SBE-repeat echocardiogram with possible aortic valve vegetation. However patient is not febrile and repeat blood cultures in June were negative. He had repeat cultures yesterday and will continue antibiotics for now.  6. Acute kidney disease-patient's renal function is worsening. Will discontinue metolazone. Continue Lasix and follow.  7. Hypokalemia-supplement.  Signed, Kirk Ruths, MD  09/29/2016, 11:11 AM

## 2016-09-29 NOTE — Progress Notes (Signed)
RT NOTE:  Pt refuses ABG for this AM. Pt states "I have been stuck 4x already and they blood sticks really hurt me. I remember having this done before and it was real painful. I want to talk to my MD about it today."  RT explained procedure thoroughly, pt continued to refuse.  ABG canceled.

## 2016-09-30 ENCOUNTER — Inpatient Hospital Stay (HOSPITAL_COMMUNITY): Payer: Medicare Other

## 2016-09-30 ENCOUNTER — Other Ambulatory Visit (HOSPITAL_COMMUNITY): Payer: Self-pay

## 2016-09-30 LAB — CBC
HEMATOCRIT: 38.4 % — AB (ref 39.0–52.0)
HEMOGLOBIN: 11.8 g/dL — AB (ref 13.0–17.0)
MCH: 24.7 pg — ABNORMAL LOW (ref 26.0–34.0)
MCHC: 30.7 g/dL (ref 30.0–36.0)
MCV: 80.3 fL (ref 78.0–100.0)
Platelets: 233 10*3/uL (ref 150–400)
RBC: 4.78 MIL/uL (ref 4.22–5.81)
RDW: 16.4 % — ABNORMAL HIGH (ref 11.5–15.5)
WBC: 10.9 10*3/uL — AB (ref 4.0–10.5)

## 2016-09-30 LAB — BASIC METABOLIC PANEL WITH GFR
Anion gap: 14 (ref 5–15)
BUN: 53 mg/dL — ABNORMAL HIGH (ref 6–20)
CO2: 24 mmol/L (ref 22–32)
Calcium: 8.9 mg/dL (ref 8.9–10.3)
Chloride: 99 mmol/L — ABNORMAL LOW (ref 101–111)
Creatinine, Ser: 1.39 mg/dL — ABNORMAL HIGH (ref 0.61–1.24)
GFR calc Af Amer: 58 mL/min — ABNORMAL LOW
GFR calc non Af Amer: 50 mL/min — ABNORMAL LOW
Glucose, Bld: 114 mg/dL — ABNORMAL HIGH (ref 65–99)
Potassium: 3.9 mmol/L (ref 3.5–5.1)
Sodium: 137 mmol/L (ref 135–145)

## 2016-09-30 LAB — HEPARIN LEVEL (UNFRACTIONATED): HEPARIN UNFRACTIONATED: 0.58 [IU]/mL (ref 0.30–0.70)

## 2016-09-30 MED ORDER — FUROSEMIDE 10 MG/ML IJ SOLN
80.0000 mg | Freq: Three times a day (TID) | INTRAMUSCULAR | Status: DC
  Administered 2016-09-30 – 2016-10-01 (×5): 80 mg via INTRAVENOUS
  Filled 2016-09-30 (×5): qty 8

## 2016-09-30 MED ORDER — QUETIAPINE FUMARATE 50 MG PO TABS
50.0000 mg | ORAL_TABLET | Freq: Every evening | ORAL | Status: DC | PRN
  Administered 2016-09-30 – 2016-10-01 (×2): 50 mg via ORAL
  Filled 2016-09-30 (×2): qty 1

## 2016-09-30 NOTE — Care Management Note (Signed)
Case Management Note  Patient Details  Name: Charles [name] MRN: [id number] Date of Birth: 1946-04-06  Subjective/Objective:  Pt presented for SOB on exertion-CHF-initiated on IV diuresis. Pt with Severe Aortic Regurgitation. Plan will be to proceed on Friday 10-02-16  For AVR/ CABG. Pt is from home with wife. Pt will need PT/OT once stable for disposition recommendations. Pt may need HH vs SNF as it nears closer to d/c. PTA pt was independent- No DME.                  Action/Plan: CM will continue to monitor for additional needs.   Expected Discharge Date:                  Expected Discharge Plan:  [place]  In-House Referral:     Discharge planning Services  CM Consult  Post Acute Care Choice:    Choice offered to:     DME Arranged:    DME Agency:     HH Arranged:    [place] Agency:     Status of Service:  In process, will continue to follow  If discussed at Long Length of Stay Meetings, dates discussed:    Additional Comments:  Bethena Roys, RN 09/30/2016, 12:13 PM

## 2016-09-30 NOTE — Progress Notes (Signed)
Progress Note  Patient Name: Charles [name] Date of Encounter: 09/30/2016  Primary Cardiologist: Dr. Domenic Polite  Subjective   Patient reports dyspnea on exertion and exertional chest pain. He can't walk to the bathroom in his room without sitting down to rest. Still requiring supplemental O2  Inpatient Medications    Scheduled Meds: . aspirin EC  81 mg Oral Daily  . atorvastatin  80 mg Oral q1800  . chlorhexidine  15 mL Mouth/Throat QID  . feeding supplement (ENSURE ENLIVE)  237 mL Oral BID BM  . ferrous sulfate  325 mg Oral Q breakfast  . furosemide  80 mg Intravenous BID  . LORazepam  0.5 mg Intravenous Once  . potassium chloride  40 mEq Oral BID  . saccharomyces boulardii  250 mg Oral BID   Continuous Infusions: . cefTRIAXone (ROCEPHIN)  IV Stopped (09/29/16 2119)  . heparin 1,800 Units/hr (09/30/16 0700)  . vancomycin Stopped (09/29/16 2149)   PRN Meds: acetaminophen, LORazepam, nitroGLYCERIN, ondansetron (ZOFRAN) IV, polyethylene glycol, zolpidem   Vital Signs    Vitals:   09/29/16 1943 09/30/16 0314 09/30/16 0500 09/30/16 0736  BP: (!) 123/43 (!) 94/35  (!) 100/30  Pulse: 86   80  Resp: (!) 21 18  18   Temp: 98 F (36.7 C) 98.2 F (36.8 C)  97.9 F (36.6 C)  TempSrc: Oral Oral  Oral  SpO2: 94% 94%  95%  Weight:  229 lb 9.6 oz (104.1 kg) 229 lb 9.6 oz (104.1 kg)   Height:        Intake/Output Summary (Last 24 hours) at 09/30/16 0744 Last data filed at 09/30/16 0700  Gross per 24 hour  Intake           2000.4 ml  Output             1750 ml  Net            250.4 ml   Filed Weights   09/29/16 0459 09/30/16 0314 09/30/16 0500  Weight: 229 lb 9.6 oz (104.1 kg) 229 lb 9.6 oz (104.1 kg) 229 lb 9.6 oz (104.1 kg)     Physical Exam   General: Well developed, well nourished, male appearing in no acute distress. Head: Normocephalic, atraumatic.  Neck: Supple without bruits, minimal JVD. Lungs:  Resp regular and unlabored, CTA, scattered crackles in  bases Heart: RRR, S1, S2, mechanical valve sounds, systolic murmur Abdomen: Soft, non-tender, non-distended with normoactive bowel sounds. No hepatomegaly. No rebound/guarding. No obvious abdominal masses. Extremities: No clubbing, cyanosis, trace to 1+ LE edema. Distal pedal pulses are 2+ bilaterally. Neuro: Alert and oriented X 3. Moves all extremities spontaneously. Psych: Normal affect.  Labs    Chemistry Recent Labs Lab 09/24/16 0715  09/28/16 0218 09/29/16 0314 09/30/16 0417  NA 138  < > 138 138 137  K 3.6  < > 3.1* 3.0* 3.9  CL 105  < > 99* 97* 99*  CO2 24  < > 29 25 24   GLUCOSE 111*  < > 107* 106* 114*  BUN 23*  < > 45* 51* 53*  CREATININE 1.25*  < > 1.49* 1.43* 1.39*  CALCIUM 8.5*  < > 8.6* 8.4* 8.9  PROT 7.1  --   --  6.4*  --   ALBUMIN 3.5  --   --  3.1*  --   AST 18  --   --  18  --   ALT 11*  --   --  13*  --  ALKPHOS 108  --   --  96  --   BILITOT 0.5  --   --  0.8  --   GFRNONAA 57*  < > 46* 48* 50*  GFRAA >60  < > 53* 56* 58*  ANIONGAP 9  < > 10 16* 14  < > = values in this interval not displayed.   Hematology Recent Labs Lab 09/28/16 0218 09/29/16 0500 09/30/16 0417  WBC 9.1 10.8* 10.9*  RBC 4.61 4.69 4.78  HGB 11.5* 12.0* 11.8*  HCT 37.2* 37.7* 38.4*  MCV 80.7 80.4 80.3  MCH 24.9* 25.6* 24.7*  MCHC 30.9 31.8 30.7  RDW 16.5* 16.4* 16.4*  PLT 225 219 233    Cardiac Enzymes Recent Labs Lab 09/24/16 0715 09/24/16 1036  TROPONINI 0.25* 0.85*   No results for input(s): TROPIPOC in the last 168 hours.   BNP Recent Labs Lab 09/24/16 0715  BNP 253.0*     DDimer No results for input(s): DDIMER in the last 168 hours.   Radiology    Dg Chest 2 View  Result Date: 09/29/2016 CLINICAL DATA:  Shortness of breath. EXAM: CHEST  2 VIEW COMPARISON:  Portable chest x-ray of September 24, 2016 FINDINGS: The lungs are well-expanded. The interstitial markings are increased bilaterally. There are bilateral pleural effusions blunting the costophrenic  angles. Patchy increased density is noted at both bases and in the right upper lobe. The heart is enlarged. The pulmonary vascularity is engorged. There is calcification in the wall of the thoracic aorta. The bony thorax is unremarkable. IMPRESSION: CHF with pulmonary interstitial and alveolar edema. Small bilateral pleural effusions. Bibasilar subsegmental atelectasis. Thoracic aortic atherosclerosis. Electronically Signed   By: David  Martinique M.D.   On: 09/29/2016 07:14     Telemetry    Sinus rhythm with PVCs - Personally Reviewed  ECG    No new tracings - Personally Reviewed   Cardiac Studies   Echo 07/03/2016  - Left ventricle: The cavity size was mildly dilated. Wall  thickness was increased in a pattern of mild LVH. Systolic  function was normal. The estimated ejection fraction was 55%.  Probable hypokinesis of the apicalinferior myocardium. Doppler  parameters are consistent with abnormal left ventricular  relaxation (grade 1 diastolic dysfunction). Doppler parameters  are consistent with high ventricular filling pressure.  - Aortic valve: Bicuspid; moderately thickened leaflets.  Intermittently noted small echodensities seen near leaflet tips  in the parasternal views, although not well in other views.  Suspicious for vegetations particularly in light of bacteremia  although TEE is indicated for further assessment, and also to  exclude any associated abscess in light of fairly peripheral  aortic regurgitation. There was moderate regurgitation. Mean  gradient (S): 5 mm Hg. Peak gradient (S): 8 mm Hg. Regurgitation  pressure half-time: 383 ms.  - Aorta: Aortic root dimension: 50 mm (ED).  - Aortic root: The aortic root was moderately dilated.  - Mitral valve: Calcified annulus. There was mild regurgitation.  - Right atrium: Echodensity noted near the septum and tricuspid  valve plane, best seen in the apical views but not in the  subcostal views. Suspect artifact or perhaps  visualization of  compressed free wall, although in light of bacteremia would  suggest further imaging with TEE to exclude other pathology.  Central venous pressure (est): 8 mm Hg.  - Tricuspid valve: There was trivial regurgitation.  - Pulmonary arteries: PA peak pressure: 28 mm Hg (S).  - Pericardium, extracardiac: There was no pericardial effusion.  Impressions:  - Mildly dilated left ventricle with mild LVH and LVEF  approximately 55%. Probable hypokinesis of the apical inferior  wall. Grade 1 diastolic dysfunction with increased filling  pressures. Calcified mitral annulus with mild mitral  regurgitation. Aortic valve is bicuspid and moderately thickened.  Intermittently noted small echodensities seen near leaflet tips  in the parasternal views, although not well in other views.  Suspicious for vegetations particularly in light of bacteremia  although TEE is indicated for further assessment, and also to  exclude any associated abscess in light of fairly peripheral  aortic regurgitation. Aortic root is moderately dilated, measures  up to 50 mm. Trivial tricuspid regurgitation with PASP estimated  28 mmHg. Echodensity noted in right atrium near the septum and  tricuspid valve plane, best seen in the apical views but not in  the subcostal views. Suspect artifact or perhaps visualization of  compressed free wall, although in light of bacteremia would  suggest further imaging with TEE to exclude other pathology.  Results discussed with Dr. Jerilee Hoh.    TEE 07/07/2016  - Left ventricle: The cavity size was moderately dilated. Wall  thickness was normal. Systolic function was normal. The estimated  ejection fraction was in the range of 60% to 65%. Wall motion was  normal; there were no regional wall motion abnormalities.  - Aortic valve: The aortic valve was bicuspid. There was eccentric  aortic insufficiency that appeared severe. There was no aortic  stenosis. No definite  vegetation seen. There was a small area of  thickening with possible potential space at the valve base  adjacent to the RV. The AI appeared to originate from this area.  However, I am not convinced this is a vegetation or abscess.  - Aorta: The aortic root was dilated to to 4.7 cm with a 4.4 cm  ascending aorta. Diastolic flow reversal noted in the ascending  thoracic aorta.  - Mitral valve: No evidence of vegetation. There was mild  regurgitation.  - Left atrium: No evidence of thrombus in the atrial cavity or  appendage.  - Right ventricle: The cavity size was normal. Systolic function  was normal.  - Right atrium: No evidence of thrombus in the atrial cavity or  appendage.  - Atrial septum: No defect or patent foramen ovale was identified.  - Tricuspid valve: No evidence of vegetation.  - Pulmonic valve: No evidence of vegetation.  Impressions:  - 1. Moderately dilated LV with normal systolic function.  2. Bicuspid aortic valve with severe eccentric aortic  insufficiency. No definite infection seen.  3. Moderately dilated aortic root and ascending aorta.    Cath 08/03/2016  Conclusion  Ost RCA lesion, 70 %stenosed.  Prox Cx to Mid Cx lesion, 30 %stenosed.  Mid LAD lesion, 20 %stenosed.  Ost 2nd Diag lesion, 20 %stenosed.  Dist LAD lesion, 40 %stenosed.  LV end diastolic pressure is mildly elevated. 1. Severe stenosis ostium of the large dominant RCA. There is dampening of the pressure with catheter engagement of this vessel suggesting the stenosis is severe.  2. Mild disease in the Circumflex and LAD  3. Mildly elevated LV filling pressure  4. Dilated ascending aorta.  Recommendation: He will continue planning for AVR. He will need AVR and bypass of the RCA with special attention paid to his ascending aorta. He has follow up in 2 weeks with Dr. Roxy Manns.      Patient Profile     71 y.o. male with a hx of subacute bacterial endocarditis, severe  non-rheumatic AoV  regurgitationbeing considered for AoV replacement/ repair, bicuspid AoV, CAD with 70% RCA disease, and mild non-obstructive disease elsewhere presented to [facility] with dyspnea, CHF.  Assessment & Plan    1. Acute on chronic diastolic heart failure - Diuresing on 80 mg IV Lasix twice a day, Zaroxolyn DC'd yesterday - Weight is 229 pounds, which is stable from her admission weight - He is overall net -3.6 L, with 1.7 L urine output yesterday - Renal function is improving - patient states he is still short of breath and has exertional chest pain - continue lasix 80 mg IV bid today - repeat CXR yesterday with bilateral pleural effusion and pulmonary interstitial edema  2. Severe non-rheumatic aortic regurgitation with bicuspid valve - Patient follows with Dr. Roxy Manns, who recommends aortic valve replacement aortic root replacement and CABG. - No afterload reducing agents as his blood pressure is marginal: sBP 90-100s  3. Descending aortic aneurysm - Aortic root replacement planned.   4. CAD - 70% stenosis of the RCA by 08/03/2016 - Will undergo CABG at the time of valve repair  - continue heparin drip  5.  Hx subacute bacterial endocarditis  - patient remains afebrile with mild leukocytosis, WBC 10.9 (10.8) - continue vanc and rocephin - ID/pharmacy consulted - repeat blood cultures pending, no growth < 24 hr  6. Acute kidney injury - sCr improving since D/C metolazone: 1.39 (1.43)  7. Hypokalemia - resolved  8. Insomnia, mild confusion - pt states he has not slept for several nights; ambien and ativan have not helped - will try seroquel tonight  Signed, Ledora Bottcher , PA-C 7:44 AM 09/30/2016 Pager: [phone] As above, patient seen and examined. Patient complains of increased dyspnea this morning. He has chest tightness with exertion. On exam he is dyspneic and has basilar crackles. Repeat chest xray. I will increase his Lasix to 80 mg IV 3 times a day. Follow renal  function. Patient is extremely tenuous and I believe his CHF is secondary to severe aortic insufficiency. He is tentatively planned for aortic valve replacement/aortic root replacement/coronary artery bypass graft on Friday with Dr. Roxy Manns. If he deteriorates further we may need to proceed more urgently. His blood pressure is borderline and I cannot add afterload reduction. Continue aspirin and heparin. Transfer to stepdown.  Kirk Ruths, MD

## 2016-09-30 NOTE — Care Management Important Message (Signed)
Important Message  Patient Details  Name: Charles [name] MRN: [id number] Date of Birth: 08-Jul-1945   Medicare Important Message Given:  Yes    Taffany Heiser Abena 09/30/2016, 10:06 AM

## 2016-09-30 NOTE — Progress Notes (Signed)
RN called MD for verbal order for CPAP. Patient was having apnea spells when sleeping. When I arrived to the room, patient was eager to try, but then became very anxious when wearing. He agreed to try for a bit. Placed on auto titrate CPAP with 4L bleed in. RN to notify if needed.

## 2016-09-30 NOTE — Progress Notes (Signed)
CARDIAC REHAB PHASE I   Pt sitting recliner, dyspnea at rest, reports chest pain with minimal exertion, will hold pre-op ambulation for now. Completed cardiac surgery pre-op education. Reviewed IS, sternal precautions, activity progression, cardiac surgery booklet and cardiac surgery guidelines. Left instructions to view cardiac surgery videos. Pt verbalized understanding, appreciative of information, anxious. Pt in recliner, call bell within reach. Will follow post-op.   [id number] Lenna Sciara, RN, BSN 09/30/2016 8:57 AM

## 2016-09-30 NOTE — Procedures (Signed)
Patient refused CPAP for the night.  Patient is aware to ask RN to call Respiratory if he changes his mind. 

## 2016-09-30 NOTE — Progress Notes (Signed)
ANTICOAGULATION CONSULT NOTE - Follow Up Consult  Pharmacy Consult for Heparin Indication: NSTEMI  Allergies  Allergen Reactions  . No Known Allergies     Patient Measurements: Height: 6' (182.9 cm) Weight: 229 lb 9.6 oz (104.1 kg) IBW/kg (Calculated) : 77.6 Heparin dosing wt: 99 kg  Vital Signs: Temp: 97.9 F (36.6 C) (06/20 0736) Temp Source: Oral (06/20 0736) BP: 100/30 (06/20 0736) Pulse Rate: 80 (06/20 0736)  Labs:  Recent Labs  09/28/16 0218 09/29/16 0314 09/29/16 0500 09/30/16 0417  HGB 11.5*  --  12.0* 11.8*  HCT 37.2*  --  37.7* 38.4*  PLT 225  --  219 233  APTT  --  80*  --   --   LABPROT  --  14.3  --   --   INR  --  1.10  --   --   HEPARINUNFRC 0.52 0.53  --  0.58  CREATININE 1.49* 1.43*  --  1.39*    Estimated Creatinine Clearance: 62.6 mL/min (A) (by C-G formula based on SCr of 1.39 mg/dL (H)).  Assessment: 63 YOM on heparin for stage D severe symptomatic aortic insufficiency and single-vessel coronary artery disease. Heparin level remains therapeutic at 0.58.CBC stable. AVR planned 6/22.   Goal of Therapy:  Heparin level 0.3-0.7 units/ml Monitor platelets by anticoagulation protocol: Yes   Plan:  1. Continue heparin gtt at 1800 units/hr 2. Heparin level and CBC daily 3. Monitor for s/s bleeding   Vincenza Hews, PharmD, BCPS 09/30/2016, 9:20 AM Phone# [phone]

## 2016-10-01 ENCOUNTER — Encounter (HOSPITAL_COMMUNITY): Payer: Self-pay | Admitting: Certified Registered Nurse Anesthetist

## 2016-10-01 ENCOUNTER — Inpatient Hospital Stay (HOSPITAL_COMMUNITY): Payer: Medicare Other

## 2016-10-01 DIAGNOSIS — I2510 Atherosclerotic heart disease of native coronary artery without angina pectoris: Secondary | ICD-10-CM

## 2016-10-01 DIAGNOSIS — Z01810 Encounter for preprocedural cardiovascular examination: Secondary | ICD-10-CM

## 2016-10-01 DIAGNOSIS — I352 Nonrheumatic aortic (valve) stenosis with insufficiency: Secondary | ICD-10-CM

## 2016-10-01 LAB — BASIC METABOLIC PANEL
Anion gap: 12 (ref 5–15)
Anion gap: 11 (ref 5–15)
BUN: 52 mg/dL — AB (ref 6–20)
BUN: 52 mg/dL — AB (ref 6–20)
CHLORIDE: 96 mmol/L — AB (ref 101–111)
CHLORIDE: 97 mmol/L — AB (ref 101–111)
CO2: 27 mmol/L (ref 22–32)
CO2: 27 mmol/L (ref 22–32)
CREATININE: 1.54 mg/dL — AB (ref 0.61–1.24)
Calcium: 8.8 mg/dL — ABNORMAL LOW (ref 8.9–10.3)
Calcium: 8.9 mg/dL (ref 8.9–10.3)
Creatinine, Ser: 1.4 mg/dL — ABNORMAL HIGH (ref 0.61–1.24)
GFR calc Af Amer: 51 mL/min — ABNORMAL LOW (ref >60)
GFR calc Af Amer: 58 mL/min — ABNORMAL LOW (ref >60)
GFR calc non Af Amer: 44 mL/min — ABNORMAL LOW (ref >60)
GFR calc non Af Amer: 50 mL/min — ABNORMAL LOW (ref >60)
GLUCOSE: 128 mg/dL — AB (ref 65–99)
Glucose, Bld: 117 mg/dL — ABNORMAL HIGH (ref 65–99)
POTASSIUM: 3.8 mmol/L (ref 3.5–5.1)
Potassium: 3.8 mmol/L (ref 3.5–5.1)
SODIUM: 135 mmol/L (ref 135–145)
Sodium: 135 mmol/L (ref 135–145)

## 2016-10-01 LAB — URINE CULTURE: Culture: NO GROWTH

## 2016-10-01 LAB — CBC
HCT: 36.1 % — ABNORMAL LOW (ref 39.0–52.0)
Hemoglobin: 11.4 g/dL — ABNORMAL LOW (ref 13.0–17.0)
MCH: 25.4 pg — AB (ref 26.0–34.0)
MCHC: 31.6 g/dL (ref 30.0–36.0)
MCV: 80.4 fL (ref 78.0–100.0)
PLATELETS: 235 10*3/uL (ref 150–400)
RBC: 4.49 MIL/uL (ref 4.22–5.81)
RDW: 16.3 % — ABNORMAL HIGH (ref 11.5–15.5)
WBC: 9.2 10*3/uL (ref 4.0–10.5)

## 2016-10-01 LAB — HEPARIN LEVEL (UNFRACTIONATED): Heparin Unfractionated: 0.46 IU/mL (ref 0.30–0.70)

## 2016-10-01 LAB — VANCOMYCIN, TROUGH: VANCOMYCIN TR: 28 ug/mL — AB (ref 15–20)

## 2016-10-01 LAB — ABO/RH: ABO/RH(D): A POS

## 2016-10-01 MED ORDER — LIDOCAINE HCL (CARDIAC) 20 MG/ML IV SOLN
260.0000 mg | INTRAVENOUS | Status: DC
  Filled 2016-10-01 (×2): qty 13

## 2016-10-01 MED ORDER — MANNITOL 20% IV SOLUTION 10G/50ML
6.4000 g | INTRAVENOUS | Status: DC
  Filled 2016-10-01 (×2): qty 60

## 2016-10-01 MED ORDER — TRANEXAMIC ACID 1000 MG/10ML IV SOLN
1.5000 mg/kg/h | INTRAVENOUS | Status: AC
  Administered 2016-10-02: 1.5 mg/kg/h via INTRAVENOUS
  Filled 2016-10-01 (×2): qty 25

## 2016-10-01 MED ORDER — DEXMEDETOMIDINE HCL IN NACL 400 MCG/100ML IV SOLN
0.1000 ug/kg/h | INTRAVENOUS | Status: AC
  Administered 2016-10-02: .3 ug/kg/h via INTRAVENOUS
  Filled 2016-10-01 (×2): qty 100

## 2016-10-01 MED ORDER — SODIUM CHLORIDE 0.9 % IV SOLN
30.0000 ug/min | INTRAVENOUS | Status: AC
  Administered 2016-10-02: 25 ug/min via INTRAVENOUS
  Filled 2016-10-01 (×2): qty 2

## 2016-10-01 MED ORDER — NITROGLYCERIN IN D5W 200-5 MCG/ML-% IV SOLN
2.0000 ug/min | INTRAVENOUS | Status: DC
  Filled 2016-10-01: qty 250

## 2016-10-01 MED ORDER — VANCOMYCIN HCL 10 G IV SOLR
1250.0000 mg | INTRAVENOUS | Status: DC
  Filled 2016-10-01 (×2): qty 1250

## 2016-10-01 MED ORDER — CHLORHEXIDINE GLUCONATE 4 % EX LIQD
60.0000 mL | Freq: Once | CUTANEOUS | Status: AC
  Administered 2016-10-02: 4 via TOPICAL

## 2016-10-01 MED ORDER — DOPAMINE-DEXTROSE 3.2-5 MG/ML-% IV SOLN
0.0000 ug/kg/min | INTRAVENOUS | Status: AC
  Administered 2016-10-02: 3 ug/kg/min via INTRAVENOUS
  Filled 2016-10-01: qty 250

## 2016-10-01 MED ORDER — CHLORHEXIDINE GLUCONATE 0.12 % MT SOLN
15.0000 mL | Freq: Once | OROMUCOSAL | Status: AC
  Administered 2016-10-02: 15 mL via OROMUCOSAL

## 2016-10-01 MED ORDER — PAPAVERINE HCL 30 MG/ML IJ SOLN
INTRAMUSCULAR | Status: AC
  Administered 2016-10-02: 500 mL
  Filled 2016-10-01 (×2): qty 2.5

## 2016-10-01 MED ORDER — METOPROLOL TARTRATE 12.5 MG HALF TABLET
12.5000 mg | ORAL_TABLET | Freq: Once | ORAL | Status: AC
  Administered 2016-10-02: 12.5 mg via ORAL
  Filled 2016-10-01: qty 1

## 2016-10-01 MED ORDER — TRANEXAMIC ACID (OHS) BOLUS VIA INFUSION
15.0000 mg/kg | INTRAVENOUS | Status: AC
  Administered 2016-10-02: 1563 mg via INTRAVENOUS
  Filled 2016-10-01: qty 1563

## 2016-10-01 MED ORDER — MAGNESIUM SULFATE 50 % IJ SOLN
40.0000 meq | INTRAMUSCULAR | Status: DC
  Filled 2016-10-01 (×2): qty 10

## 2016-10-01 MED ORDER — CHLORHEXIDINE GLUCONATE 4 % EX LIQD
60.0000 mL | Freq: Once | CUTANEOUS | Status: AC
  Administered 2016-10-01: 4 via TOPICAL
  Filled 2016-10-01: qty 60

## 2016-10-01 MED ORDER — SODIUM CHLORIDE 0.9 % IV SOLN
INTRAVENOUS | Status: AC
  Administered 2016-10-02: 1 [IU]/h via INTRAVENOUS
  Filled 2016-10-01 (×2): qty 1

## 2016-10-01 MED ORDER — POTASSIUM CHLORIDE 2 MEQ/ML IV SOLN
80.0000 meq | INTRAVENOUS | Status: DC
  Filled 2016-10-01 (×2): qty 40

## 2016-10-01 MED ORDER — EPINEPHRINE PF 1 MG/ML IJ SOLN
0.0000 ug/min | INTRAVENOUS | Status: DC
  Filled 2016-10-01 (×2): qty 4

## 2016-10-01 MED ORDER — VANCOMYCIN HCL 1000 MG IV SOLR
INTRAVENOUS | Status: AC
  Administered 2016-10-02: 1000 mL
  Filled 2016-10-01: qty 1000

## 2016-10-01 MED ORDER — VANCOMYCIN HCL IN DEXTROSE 1-5 GM/200ML-% IV SOLN
1000.0000 mg | INTRAVENOUS | Status: DC
  Administered 2016-10-02: 1250 mg via INTRAVENOUS
  Filled 2016-10-01 (×2): qty 200

## 2016-10-01 MED ORDER — SODIUM CHLORIDE 0.9 % IV SOLN
INTRAVENOUS | Status: DC
  Filled 2016-10-01 (×2): qty 30

## 2016-10-01 MED ORDER — TRANEXAMIC ACID (OHS) PUMP PRIME SOLUTION
2.0000 mg/kg | INTRAVENOUS | Status: DC
  Filled 2016-10-01 (×2): qty 2.08

## 2016-10-01 MED ORDER — BISACODYL 5 MG PO TBEC
5.0000 mg | DELAYED_RELEASE_TABLET | Freq: Once | ORAL | Status: AC
  Administered 2016-10-01: 5 mg via ORAL
  Filled 2016-10-01: qty 1

## 2016-10-01 MED ORDER — DEXTROSE 5 % IV SOLN
750.0000 mg | INTRAVENOUS | Status: DC
  Filled 2016-10-01 (×2): qty 750

## 2016-10-01 MED ORDER — DEXTROSE 5 % IV SOLN
1.5000 g | INTRAVENOUS | Status: AC
  Administered 2016-10-02: .75 g via INTRAVENOUS
  Administered 2016-10-02: 1.5 g via INTRAVENOUS
  Filled 2016-10-01 (×2): qty 1.5

## 2016-10-01 NOTE — Progress Notes (Signed)
Attempted to stick for ABG several times x 2 RT (2 sticks each).  Unable to obtain ABG currently.  Discussed w/ RN and floor RT.  Floor RT to attempt ABG later today.

## 2016-10-01 NOTE — Anesthesia Preprocedure Evaluation (Addendum)
Anesthesia Evaluation  Patient identified by MRN, date of birth, ID band Patient awake    Reviewed: Allergy & Precautions, H&P , NPO status , Patient's Chart, lab work & pertinent test results  Airway Mallampati: II  TM Distance: >3 FB Neck ROM: Full    Dental no notable dental hx. (+) Edentulous Upper, Dental Advisory Given   Pulmonary neg pulmonary ROS, former smoker,    Pulmonary exam normal breath sounds clear to auscultation       Cardiovascular Exercise Tolerance: Good + CAD, + Peripheral Vascular Disease and +CHF  + Valvular Problems/Murmurs AI  Rhythm:Regular Rate:Normal     Neuro/Psych negative neurological ROS  negative psych ROS   GI/Hepatic negative GI ROS, Neg liver ROS,   Endo/Other  negative endocrine ROS  Renal/GU negative Renal ROS  negative genitourinary   Musculoskeletal   Abdominal   Peds  Hematology  (+) anemia ,   Anesthesia Other Findings   Reproductive/Obstetrics negative OB ROS                            Anesthesia Physical Anesthesia Plan  ASA: IV  Anesthesia Plan: General   Post-op Pain Management:    Induction: Intravenous  PONV Risk Score and Plan: 2 and Midazolam, Propofol and Treatment may vary due to age or medical condition  Airway Management Planned: Oral ETT  Additional Equipment: Arterial line, CVP, PA Cath, TEE, Ultrasound Guidance Line Placement and 3D TEE  Intra-op Plan:   Post-operative Plan: Post-operative intubation/ventilation  Informed Consent: I have reviewed the patients History and Physical, chart, labs and discussed the procedure including the risks, benefits and alternatives for the proposed anesthesia with the patient or authorized representative who has indicated his/her understanding and acceptance.   Dental advisory given  Plan Discussed with: CRNA  Anesthesia Plan Comments:         Anesthesia Quick Evaluation

## 2016-10-01 NOTE — Progress Notes (Addendum)
Pre-op Cardiac Surgery  Carotid Findings:  1-39% ICA plaquing. Vertebral artery flow is antegrade.   Upper Extremity Right Left  Brachial Pressures 109T 102T  Radial Waveforms T T  Ulnar Waveforms T T  Palmar Arch (Allen's Test) Doppler signal remains normal with radial compression and obliterates with ulnar compression Doppler signal diminishes <50% with radial compression and obliterates with ulnar compression   Findings:      Lower  Extremity Right Left  Dorsalis Pedis    Anterior Tibial Tri Tri  Posterior Tibial Tri Tri  Ankle/Brachial Indices      Findings:

## 2016-10-01 NOTE — Progress Notes (Signed)
ANTICOAGULATION CONSULT NOTE - Follow Up Consult  Pharmacy Consult for Heparin Indication: NSTEMI  Allergies  Allergen Reactions  . No Known Allergies     Patient Measurements: Height: 6' (182.9 cm) Weight: 229 lb 11.2 oz (104.2 kg) IBW/kg (Calculated) : 77.6 Heparin dosing wt: 99 kg  Vital Signs: Temp: 97.6 F (36.4 C) (06/21 1119) Temp Source: Oral (06/21 1119) BP: 97/29 (06/21 1119) Pulse Rate: 89 (06/21 1119)  Labs:  Recent Labs  09/29/16 0314  09/29/16 0500 09/30/16 0417 10/01/16 0434 10/01/16 0849  HGB  --   < > 12.0* 11.8* 11.4*  --   HCT  --   --  37.7* 38.4* 36.1*  --   PLT  --   --  219 233 235  --   APTT 80*  --   --   --   --   --   LABPROT 14.3  --   --   --   --   --   INR 1.10  --   --   --   --   --   HEPARINUNFRC 0.53  --   --  0.58 0.46  --   CREATININE 1.43*  --   --  1.39* 1.54* 1.40*  < > = values in this interval not displayed.  Estimated Creatinine Clearance: 62.1 mL/min (A) (by C-G formula based on SCr of 1.4 mg/dL (H)).  Assessment: 8 YOM on heparin for stage D severe symptomatic aortic insufficiency and single-vessel coronary artery disease. Heparin level remains therapeutic at 0.46. CBC stable. AVR planned 6/22.   Goal of Therapy:  Heparin level 0.3-0.7 units/ml Monitor platelets by anticoagulation protocol: Yes   Plan:  1. Continue heparin gtt at 1800 units/hr 2. Heparin level and CBC daily 3. Monitor for s/s bleeding   Vincenza Hews, PharmD, BCPS 10/01/2016, 12:26 PM Phone# [phone]

## 2016-10-01 NOTE — Progress Notes (Signed)
      [address]       ,[place] [address]             [phone]     CARDIOTHORACIC SURGERY PROGRESS NOTE  Subjective: Essentially no change.  Dyspneic with very mild activity.  No chest pain  Objective: Vital signs in last 24 hours: Temp:  [97.5 F (36.4 C)-97.7 F (36.5 C)] 97.7 F (36.5 C) (06/21 0729) Pulse Rate:  [76-87] 76 (06/21 0729) Cardiac Rhythm: Heart block (06/20 1900) Resp:  [18-24] 18 (06/21 0729) BP: (94-124)/(31-54) 94/34 (06/21 0729) SpO2:  [95 %-98 %] 98 % (06/21 0729) Weight:  [229 lb 11.2 oz (104.2 kg)] 229 lb 11.2 oz (104.2 kg) (06/21 0303)  Physical Exam:  Rhythm:   sinus  Breath sounds: Bibasilar insp crackles  Heart sounds:  RRR w/ prominent to and fro diastolic murmur  Incisions:  n/a  Abdomen:  soft  Extremities:  warm   Intake/Output from previous day: 06/20 0701 - 06/21 0700 In: 1484 [P.O.:840; I.V.:144; IV Piggyback:500] Out: 2625 [Urine:2625] Intake/Output this shift: Total I/O In: -  Out: 150 [Urine:150]  Lab Results:  Recent Labs  09/30/16 0417 10/01/16 0434  WBC 10.9* 9.2  HGB 11.8* 11.4*  HCT 38.4* 36.1*  PLT 233 235   BMET:  Recent Labs  09/30/16 0417 10/01/16 0434  NA 137 135  K 3.9 3.8  CL 99* 96*  CO2 24 27  GLUCOSE 114* 117*  BUN 53* 52*  CREATININE 1.39* 1.54*  CALCIUM 8.9 8.8*    CBG (last 3)  No results for input(s): GLUCAP in the last 72 hours. PT/INR:   Recent Labs  09/29/16 0314  LABPROT 14.3  INR 1.10    CXR:  N/A  Assessment/Plan: S/P Procedure(s) (LRB): AORTIC VALVE REPLACEMENT (AVR) (N/A) CORONARY ARTERY BYPASS GRAFTING (CABG) (N/A) THORACIC ASCENDING ANEURYSM REPAIR (AAA) (N/A) TRANSESOPHAGEAL ECHOCARDIOGRAM (TEE) (N/A)  I have again reviewed the indications, risks and potential benefits of surgery with Charles [name] this morning.  Expectations regarding his postoperative convalescence have been discussed.  All questions answered.  For OR tomorrow.  I spent in  excess of 15 minutes during the conduct of this hospital encounter and >50% of this time involved direct face-to-face encounter with the patient for counseling and/or coordination of their care.   [name], MD 10/01/2016 8:36 AM

## 2016-10-01 NOTE — Progress Notes (Signed)
Pharmacy Antibiotic Note Charles [name] is a 71 y.o. male admitted on 09/24/2016 with AV valve vegetation. 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 Currently on ceftriaxone 2 grams IV every 24 hours and vancomycin 750 mg IV every 12 hours for treatment.   Vancomycin trough of 28 is above desired range of 15-20. SCr remains stable. AVR planned on 6/22  Plan: 1. Adjust vancomycin to 1 gram IV every 24 hours starting on 6/22 2. Continue Rocephin 2gm IV q24h 3. SCr every 72 hours while on vancomycin  4. ID consult ?     Height: 6' (182.9 cm) Weight: 229 lb 11.2 oz (104.2 kg) IBW/kg (Calculated) : 77.6  Temp (24hrs), Avg:97.6 F (36.4 C), Min:97.5 F (36.4 C), Max:97.7 F (36.5 C)   Recent Labs Lab 09/27/16 0310  09/28/16 0218 09/29/16 0314 09/29/16 0500 09/30/16 0417 10/01/16 0434 10/01/16 0849  WBC 11.3*  --  9.1  --  10.8* 10.9* 9.2  --   CREATININE  --   < > 1.49* 1.43*  --  1.39* 1.54* 1.40*  VANCOTROUGH  --   --   --   --   --   --   --  28*  < > = values in this interval not displayed.  Estimated Creatinine Clearance: 62.1 mL/min (A) (by C-G formula based on SCr of 1.4 mg/dL (H)).    Allergies  Allergen Reactions  . No Known Allergies     Antimicrobials this admission: Unasyn 6/15-6/18  Ceftriaxone 6/18 >>   Vancomycin 6/18 >>   Dose adjustments this admission: 6/21: VT 28 on 750 mg q12H and SCr 1.4 > adjusted to 1 gram q24h  Microbiology results: 6/14 Blood x 2: ngF 6/18 Blood x 2: ngtd 6/19 MRSA PCR: negative  6/19 UCx: ngF  Vincenza Hews, PharmD, BCPS 10/01/2016, 12:32 PM  Phone: [phone]

## 2016-10-01 NOTE — Progress Notes (Signed)
Progress Note  Patient Name: Charles [name] Date of Encounter: 10/01/2016  Primary Cardiologist: Dr. Domenic Polite  Subjective   Remains dyspneic but improved compared to yesterday; no chest pain this AM  Inpatient Medications    Scheduled Meds: . aspirin EC  81 mg Oral Daily  . atorvastatin  80 mg Oral q1800  . chlorhexidine  15 mL Mouth/Throat QID  . feeding supplement (ENSURE ENLIVE)  237 mL Oral BID BM  . ferrous sulfate  325 mg Oral Q breakfast  . furosemide  80 mg Intravenous TID  . LORazepam  0.5 mg Intravenous Once  . potassium chloride  40 mEq Oral BID  . saccharomyces boulardii  250 mg Oral BID   Continuous Infusions: . cefTRIAXone (ROCEPHIN)  IV Stopped (09/30/16 2229)  . heparin 1,800 Units/hr (09/30/16 2000)  . vancomycin Stopped (09/30/16 2258)   PRN Meds: acetaminophen, LORazepam, nitroGLYCERIN, ondansetron (ZOFRAN) IV, polyethylene glycol, QUEtiapine, zolpidem   Vital Signs    Vitals:   09/30/16 2224 10/01/16 0303 10/01/16 0309 10/01/16 0729  BP: (!) 117/42 (!) 124/40  (!) 94/34  Pulse:  87  76  Resp:  (!) 23 19 18   Temp:  97.6 F (36.4 C)  97.7 F (36.5 C)  TempSrc:  Axillary  Oral  SpO2:  95%  98%  Weight:  104.2 kg (229 lb 11.2 oz)    Height:        Intake/Output Summary (Last 24 hours) at 10/01/16 0747 Last data filed at 10/01/16 0730  Gross per 24 hour  Intake             1484 ml  Output             2775 ml  Net            -1291 ml   Filed Weights   09/30/16 0314 09/30/16 0500 10/01/16 0303  Weight: 104.1 kg (229 lb 9.6 oz) 104.1 kg (229 lb 9.6 oz) 104.2 kg (229 lb 11.2 oz)     Physical Exam   General: WD/WN, NAD this AM Head: Normal Neck: Supple  Lungs: mild basilar crackles Heart: RRR, 2/6 systolic and diastolic murmur Abdomen: Soft, non-tender, non-distended, no masses Extremities: trace edema Neuro: No focal findings  Labs    Chemistry  Recent Labs Lab 09/29/16 0314 09/30/16 0417 10/01/16 0434  NA 138 137 135  K  3.0* 3.9 3.8  CL 97* 99* 96*  CO2 25 24 27   GLUCOSE 106* 114* 117*  BUN 51* 53* 52*  CREATININE 1.43* 1.39* 1.54*  CALCIUM 8.4* 8.9 8.8*  PROT 6.4*  --   --   ALBUMIN 3.1*  --   --   AST 18  --   --   ALT 13*  --   --   ALKPHOS 96  --   --   BILITOT 0.8  --   --   GFRNONAA 48* 50* 44*  GFRAA 56* 58* 51*  ANIONGAP 16* 14 12     Hematology  Recent Labs Lab 09/29/16 0500 09/30/16 0417 10/01/16 0434  WBC 10.8* 10.9* 9.2  RBC 4.69 4.78 4.49  HGB 12.0* 11.8* 11.4*  HCT 37.7* 38.4* 36.1*  MCV 80.4 80.3 80.4  MCH 25.6* 24.7* 25.4*  MCHC 31.8 30.7 31.6  RDW 16.4* 16.4* 16.3*  PLT 219 233 235    Cardiac Enzymes  Recent Labs Lab 09/24/16 1036  TROPONINI 0.85*    Radiology    Dg Chest Port 1 View  Result Date:  09/30/2016 CLINICAL DATA:  Shortness of Breath EXAM: PORTABLE CHEST 1 VIEW COMPARISON:  September 29, 2016 FINDINGS: There remains interstitial diffusely and mild patchy alveolar edema in the right upper lobe and lung base regions. There are small bilateral pleural effusions. There is cardiomegaly with pulmonary venous hypertension. No adenopathy. No bone lesions. IMPRESSION: Persistent congestive heart failure. There may be marginally less edema compared to 1 day prior. No new opacity. Stable cardiac silhouette. Electronically Signed   By: Lowella Grip III M.D.   On: 09/30/2016 10:10     Telemetry    Sinus rhythm- Personally Reviewed    Cardiac Studies   Echo 07/03/2016  - Left ventricle: The cavity size was mildly dilated. Wall  thickness was increased in a pattern of mild LVH. Systolic  function was normal. The estimated ejection fraction was 55%.  Probable hypokinesis of the apicalinferior myocardium. Doppler  parameters are consistent with abnormal left ventricular  relaxation (grade 1 diastolic dysfunction). Doppler parameters  are consistent with high ventricular filling pressure.  - Aortic valve: Bicuspid; moderately thickened leaflets.    Intermittently noted small echodensities seen near leaflet tips  in the parasternal views, although not well in other views.  Suspicious for vegetations particularly in light of bacteremia  although TEE is indicated for further assessment, and also to  exclude any associated abscess in light of fairly peripheral  aortic regurgitation. There was moderate regurgitation. Mean  gradient (S): 5 mm Hg. Peak gradient (S): 8 mm Hg. Regurgitation  pressure half-time: 383 ms.  - Aorta: Aortic root dimension: 50 mm (ED).  - Aortic root: The aortic root was moderately dilated.  - Mitral valve: Calcified annulus. There was mild regurgitation.  - Right atrium: Echodensity noted near the septum and tricuspid  valve plane, best seen in the apical views but not in the  subcostal views. Suspect artifact or perhaps visualization of  compressed free wall, although in light of bacteremia would  suggest further imaging with TEE to exclude other pathology.  Central venous pressure (est): 8 mm Hg.  - Tricuspid valve: There was trivial regurgitation.  - Pulmonary arteries: PA peak pressure: 28 mm Hg (S).  - Pericardium, extracardiac: There was no pericardial effusion.   Impressions:  - Mildly dilated left ventricle with mild LVH and LVEF  approximately 55%. Probable hypokinesis of the apical inferior  wall. Grade 1 diastolic dysfunction with increased filling  pressures. Calcified mitral annulus with mild mitral  regurgitation. Aortic valve is bicuspid and moderately thickened.  Intermittently noted small echodensities seen near leaflet tips  in the parasternal views, although not well in other views.  Suspicious for vegetations particularly in light of bacteremia  although TEE is indicated for further assessment, and also to  exclude any associated abscess in light of fairly peripheral  aortic regurgitation. Aortic root is moderately dilated, measures  up to 50 mm. Trivial tricuspid regurgitation with  PASP estimated  28 mmHg. Echodensity noted in right atrium near the septum and  tricuspid valve plane, best seen in the apical views but not in  the subcostal views. Suspect artifact or perhaps visualization of  compressed free wall, although in light of bacteremia would  suggest further imaging with TEE to exclude other pathology.  Results discussed with Dr. Jerilee Hoh.    TEE 07/07/2016  - Left ventricle: The cavity size was moderately dilated. Wall  thickness was normal. Systolic function was normal. The estimated  ejection fraction was in the range of 60% to 65%. Wall motion  was  normal; there were no regional wall motion abnormalities.  - Aortic valve: The aortic valve was bicuspid. There was eccentric  aortic insufficiency that appeared severe. There was no aortic  stenosis. No definite vegetation seen. There was a small area of  thickening with possible potential space at the valve base  adjacent to the RV. The AI appeared to originate from this area.  However, I am not convinced this is a vegetation or abscess.  - Aorta: The aortic root was dilated to to 4.7 cm with a 4.4 cm  ascending aorta. Diastolic flow reversal noted in the ascending  thoracic aorta.  - Mitral valve: No evidence of vegetation. There was mild  regurgitation.  - Left atrium: No evidence of thrombus in the atrial cavity or  appendage.  - Right ventricle: The cavity size was normal. Systolic function  was normal.  - Right atrium: No evidence of thrombus in the atrial cavity or  appendage.  - Atrial septum: No defect or patent foramen ovale was identified.  - Tricuspid valve: No evidence of vegetation.  - Pulmonic valve: No evidence of vegetation.  Impressions:  - 1. Moderately dilated LV with normal systolic function.  2. Bicuspid aortic valve with severe eccentric aortic  insufficiency. No definite infection seen.  3. Moderately dilated aortic root and ascending aorta.    Cath 08/03/2016  Conclusion   Ost RCA lesion, 70 %stenosed.  Prox Cx to Mid Cx lesion, 30 %stenosed.  Mid LAD lesion, 20 %stenosed.  Ost 2nd Diag lesion, 20 %stenosed.  Dist LAD lesion, 40 %stenosed.  LV end diastolic pressure is mildly elevated. 1. Severe stenosis ostium of the large dominant RCA. There is dampening of the pressure with catheter engagement of this vessel suggesting the stenosis is severe.  2. Mild disease in the Circumflex and LAD  3. Mildly elevated LV filling pressure  4. Dilated ascending aorta.  Recommendation: He will continue planning for AVR. He will need AVR and bypass of the RCA with special attention paid to his ascending aorta. He has follow up in 2 weeks with Dr. Roxy Manns.      Patient Profile     71 y.o. male with a hx of subacute bacterial endocarditis, severe regurgitation for AoV replacement/ repair, aortic root replacement and CABG  Assessment & Plan    1. Acute on chronic diastolic heart failure - I/O -1141. Some improvement this morning. We will continue with Lasix 80 mg IV twice a day today. His congestive heart failure is secondary to aortic insufficiency. Follow renal function.  2. Severe non-rheumatic aortic regurgitation with bicuspid valve - Patient is scheduled for aortic valve/aortic root replacement and coronary artery bypass and graft tomorrow morning.  3. CAD - 70% stenosis of the RCA by 08/03/2016 - Will undergo CABG at the time of valve repair  - continue heparin and aspirin  4.  Hx subacute bacterial endocarditis  - Follow-up blood cultures remain negative to date.  5. Acute kidney injury - Follow renal function closely with diuresis.  Signed, Kirk Ruths , MD 7:47 AM 10/01/2016

## 2016-10-02 ENCOUNTER — Encounter (HOSPITAL_COMMUNITY)
Admission: EM | Disposition: A | Discharge status: Skilled Nursing Facility | Payer: Self-pay | Source: Home / Self Care | Attending: Thoracic Surgery (Cardiothoracic Vascular Surgery)

## 2016-10-02 ENCOUNTER — Encounter (HOSPITAL_COMMUNITY): Payer: Self-pay | Admitting: Thoracic Surgery (Cardiothoracic Vascular Surgery)

## 2016-10-02 ENCOUNTER — Inpatient Hospital Stay (HOSPITAL_COMMUNITY): Payer: Medicare Other

## 2016-10-02 ENCOUNTER — Inpatient Hospital Stay (HOSPITAL_COMMUNITY): Payer: Medicare Other | Admitting: Certified Registered Nurse Anesthetist

## 2016-10-02 DIAGNOSIS — Z954 Presence of other heart-valve replacement: Secondary | ICD-10-CM

## 2016-10-02 DIAGNOSIS — Q211 Atrial septal defect: Secondary | ICD-10-CM

## 2016-10-02 DIAGNOSIS — Z951 Presence of aortocoronary bypass graft: Secondary | ICD-10-CM

## 2016-10-02 DIAGNOSIS — Z8774 Personal history of (corrected) congenital malformations of heart and circulatory system: Secondary | ICD-10-CM

## 2016-10-02 HISTORY — PX: TEE WITHOUT CARDIOVERSION: SHX5443

## 2016-10-02 HISTORY — DX: Presence of other heart-valve replacement: Z95.4

## 2016-10-02 HISTORY — DX: Personal history of (corrected) congenital malformations of heart and circulatory system: Z87.74

## 2016-10-02 HISTORY — PX: THORACIC AORTIC ANEURYSM REPAIR: SHX799

## 2016-10-02 HISTORY — PX: PATENT FORAMEN OVALE(PFO) CLOSURE: CATH118300

## 2016-10-02 HISTORY — DX: Presence of aortocoronary bypass graft: Z95.1

## 2016-10-02 HISTORY — PX: FALSE ANEURYSM REPAIR: SHX5152

## 2016-10-02 HISTORY — PX: AORTIC VALVE REPLACEMENT: SHX41

## 2016-10-02 HISTORY — PX: CORONARY ARTERY BYPASS GRAFT: SHX141

## 2016-10-02 LAB — POCT I-STAT, CHEM 8
BUN: 49 mg/dL — AB (ref 6–20)
BUN: 52 mg/dL — AB (ref 6–20)
BUN: 48 mg/dL — ABNORMAL HIGH (ref 6–20)
BUN: 46 mg/dL — ABNORMAL HIGH (ref 6–20)
BUN: 45 mg/dL — AB (ref 6–20)
BUN: 45 mg/dL — AB (ref 6–20)
BUN: 41 mg/dL — AB (ref 6–20)
CALCIUM ION: 1.04 mmol/L — AB (ref 1.15–1.40)
CALCIUM ION: 1.01 mmol/L — AB (ref 1.15–1.40)
CHLORIDE: 97 mmol/L — AB (ref 101–111)
CHLORIDE: 98 mmol/L — AB (ref 101–111)
CHLORIDE: 98 mmol/L — AB (ref 101–111)
CHLORIDE: 101 mmol/L (ref 101–111)
CREATININE: 1.4 mg/dL — AB (ref 0.61–1.24)
CREATININE: 1.4 mg/dL — AB (ref 0.61–1.24)
CREATININE: 1.2 mg/dL (ref 0.61–1.24)
CREATININE: 1.1 mg/dL (ref 0.61–1.24)
CREATININE: 1.2 mg/dL (ref 0.61–1.24)
CREATININE: 1.2 mg/dL (ref 0.61–1.24)
Calcium, Ion: 1.21 mmol/L (ref 1.15–1.40)
Calcium, Ion: 1.19 mmol/L (ref 1.15–1.40)
Calcium, Ion: 1.07 mmol/L — ABNORMAL LOW (ref 1.15–1.40)
Calcium, Ion: 0.95 mmol/L — ABNORMAL LOW (ref 1.15–1.40)
Calcium, Ion: 0.97 mmol/L — ABNORMAL LOW (ref 1.15–1.40)
Chloride: 97 mmol/L — ABNORMAL LOW (ref 101–111)
Chloride: 96 mmol/L — ABNORMAL LOW (ref 101–111)
Chloride: 96 mmol/L — ABNORMAL LOW (ref 101–111)
Creatinine, Ser: 1.3 mg/dL — ABNORMAL HIGH (ref 0.61–1.24)
GLUCOSE: 131 mg/dL — AB (ref 65–99)
GLUCOSE: 122 mg/dL — AB (ref 65–99)
GLUCOSE: 156 mg/dL — AB (ref 65–99)
Glucose, Bld: 173 mg/dL — ABNORMAL HIGH (ref 65–99)
Glucose, Bld: 143 mg/dL — ABNORMAL HIGH (ref 65–99)
Glucose, Bld: 175 mg/dL — ABNORMAL HIGH (ref 65–99)
Glucose, Bld: 131 mg/dL — ABNORMAL HIGH (ref 65–99)
HCT: 25 % — ABNORMAL LOW (ref 39.0–52.0)
HCT: 20 % — ABNORMAL LOW (ref 39.0–52.0)
HCT: 22 % — ABNORMAL LOW (ref 39.0–52.0)
HEMATOCRIT: 33 % — AB (ref 39.0–52.0)
HEMATOCRIT: 28 % — AB (ref 39.0–52.0)
HEMATOCRIT: 23 % — AB (ref 39.0–52.0)
HEMATOCRIT: 20 % — AB (ref 39.0–52.0)
HEMOGLOBIN: 9.5 g/dL — AB (ref 13.0–17.0)
HEMOGLOBIN: 8.5 g/dL — AB (ref 13.0–17.0)
Hemoglobin: 11.2 g/dL — ABNORMAL LOW (ref 13.0–17.0)
Hemoglobin: 7.8 g/dL — ABNORMAL LOW (ref 13.0–17.0)
Hemoglobin: 6.8 g/dL — CL (ref 13.0–17.0)
Hemoglobin: 6.8 g/dL — CL (ref 13.0–17.0)
Hemoglobin: 7.5 g/dL — ABNORMAL LOW (ref 13.0–17.0)
POTASSIUM: 4 mmol/L (ref 3.5–5.1)
POTASSIUM: 4 mmol/L (ref 3.5–5.1)
POTASSIUM: 4.5 mmol/L (ref 3.5–5.1)
POTASSIUM: 5 mmol/L (ref 3.5–5.1)
Potassium: 4.1 mmol/L (ref 3.5–5.1)
Potassium: 4.6 mmol/L (ref 3.5–5.1)
Potassium: 4.2 mmol/L (ref 3.5–5.1)
SODIUM: 136 mmol/L (ref 135–145)
Sodium: 137 mmol/L (ref 135–145)
Sodium: 137 mmol/L (ref 135–145)
Sodium: 134 mmol/L — ABNORMAL LOW (ref 135–145)
Sodium: 134 mmol/L — ABNORMAL LOW (ref 135–145)
Sodium: 136 mmol/L (ref 135–145)
Sodium: 137 mmol/L (ref 135–145)
TCO2: 25 mmol/L (ref 0–100)
TCO2: 26 mmol/L (ref 0–100)
TCO2: 29 mmol/L (ref 0–100)
TCO2: 28 mmol/L (ref 0–100)
TCO2: 26 mmol/L (ref 0–100)
TCO2: 27 mmol/L (ref 0–100)
TCO2: 23 mmol/L (ref 0–100)

## 2016-10-02 LAB — CBC
HCT: 23.3 % — ABNORMAL LOW (ref 39.0–52.0)
HCT: 33.7 % — ABNORMAL LOW (ref 39.0–52.0)
HEMATOCRIT: 37.6 % — AB (ref 39.0–52.0)
HEMOGLOBIN: 11.6 g/dL — AB (ref 13.0–17.0)
HEMOGLOBIN: 7.2 g/dL — AB (ref 13.0–17.0)
Hemoglobin: 10.8 g/dL — ABNORMAL LOW (ref 13.0–17.0)
MCH: 25.2 pg — ABNORMAL LOW (ref 26.0–34.0)
MCH: 25.1 pg — AB (ref 26.0–34.0)
MCH: 26.3 pg (ref 26.0–34.0)
MCHC: 30.9 g/dL (ref 30.0–36.0)
MCHC: 30.9 g/dL (ref 30.0–36.0)
MCHC: 32 g/dL (ref 30.0–36.0)
MCV: 81.6 fL (ref 78.0–100.0)
MCV: 81.2 fL (ref 78.0–100.0)
MCV: 82.2 fL (ref 78.0–100.0)
PLATELETS: 125 10*3/uL — AB (ref 150–400)
Platelets: 219 10*3/uL (ref 150–400)
Platelets: 121 10*3/uL — ABNORMAL LOW (ref 150–400)
RBC: 4.61 MIL/uL (ref 4.22–5.81)
RBC: 2.87 MIL/uL — AB (ref 4.22–5.81)
RBC: 4.1 MIL/uL — AB (ref 4.22–5.81)
RDW: 16.6 % — AB (ref 11.5–15.5)
RDW: 16 % — ABNORMAL HIGH (ref 11.5–15.5)
RDW: 15.9 % — ABNORMAL HIGH (ref 11.5–15.5)
WBC: 9.1 10*3/uL (ref 4.0–10.5)
WBC: 21.9 10*3/uL — ABNORMAL HIGH (ref 4.0–10.5)
WBC: 23.2 10*3/uL — ABNORMAL HIGH (ref 4.0–10.5)

## 2016-10-02 LAB — POCT I-STAT 3, ART BLOOD GAS (G3+)
ACID-BASE EXCESS: 4 mmol/L — AB (ref 0.0–2.0)
Acid-base deficit: 4 mmol/L — ABNORMAL HIGH (ref 0.0–2.0)
Acid-base deficit: 4 mmol/L — ABNORMAL HIGH (ref 0.0–2.0)
Acid-base deficit: 4 mmol/L — ABNORMAL HIGH (ref 0.0–2.0)
Acid-base deficit: 1 mmol/L (ref 0.0–2.0)
BICARBONATE: 25.7 mmol/L (ref 20.0–28.0)
BICARBONATE: 29.7 mmol/L — AB (ref 20.0–28.0)
Bicarbonate: 24.3 mmol/L (ref 20.0–28.0)
Bicarbonate: 23 mmol/L (ref 20.0–28.0)
Bicarbonate: 21.6 mmol/L (ref 20.0–28.0)
Bicarbonate: 23.3 mmol/L (ref 20.0–28.0)
O2 SAT: 97 %
O2 SAT: 98 %
O2 Saturation: 100 %
O2 Saturation: 86 %
O2 Saturation: 83 %
O2 Saturation: 87 %
PCO2 ART: 44.5 mmHg (ref 32.0–48.0)
PCO2 ART: 60.6 mmHg — AB (ref 32.0–48.0)
PCO2 ART: 49.1 mmHg — AB (ref 32.0–48.0)
PCO2 ART: 37.7 mmHg (ref 32.0–48.0)
PCO2 ART: 33 mmHg (ref 32.0–48.0)
PH ART: 7.371 (ref 7.350–7.450)
PH ART: 7.274 — AB (ref 7.350–7.450)
PH ART: 7.361 (ref 7.350–7.450)
PH ART: 7.452 — AB (ref 7.350–7.450)
PO2 ART: 96 mmHg (ref 83.0–108.0)
TCO2: 27 mmol/L (ref 0–100)
TCO2: 31 mmol/L (ref 0–100)
TCO2: 26 mmol/L (ref 0–100)
TCO2: 25 mmol/L (ref 0–100)
TCO2: 23 mmol/L (ref 0–100)
TCO2: 24 mmol/L (ref 0–100)
pCO2 arterial: 51.3 mmHg — ABNORMAL HIGH (ref 32.0–48.0)
pH, Arterial: 7.369 (ref 7.350–7.450)
pH, Arterial: 7.208 — ABNORMAL LOW (ref 7.350–7.450)
pO2, Arterial: 266 mmHg — ABNORMAL HIGH (ref 83.0–108.0)
pO2, Arterial: 61 mmHg — ABNORMAL LOW (ref 83.0–108.0)
pO2, Arterial: 51 mmHg — ABNORMAL LOW (ref 83.0–108.0)
pO2, Arterial: 51 mmHg — ABNORMAL LOW (ref 83.0–108.0)
pO2, Arterial: 87 mmHg (ref 83.0–108.0)

## 2016-10-02 LAB — VAS US DOPPLER PRE CABG
LCCAPSYS: 125 cm/s
LEFT ECA DIAS: -5 cm/s
LEFT VERTEBRAL DIAS: -8 cm/s
Left CCA dist dias: -3 cm/s
Left CCA dist sys: -109 cm/s
Left CCA prox dias: 15 cm/s
Left ICA dist dias: 0 cm/s
Left ICA dist sys: -74 cm/s
Left ICA prox dias: -18 cm/s
Left ICA prox sys: -83 cm/s
RCCAPDIAS: 1 cm/s
RIGHT ECA DIAS: -7 cm/s
RIGHT VERTEBRAL DIAS: -5 cm/s
Right CCA prox sys: 92 cm/s

## 2016-10-02 LAB — BLOOD GAS, ARTERIAL
Acid-Base Excess: 3.6 mmol/L — ABNORMAL HIGH (ref 0.0–2.0)
Bicarbonate: 26.8 mmol/L (ref 20.0–28.0)
DRAWN BY: 29017
O2 Content: 28 L/min
O2 Saturation: 94.2 %
PH ART: 7.497 — AB (ref 7.350–7.450)
Patient temperature: 98.6
pCO2 arterial: 34.9 mmHg (ref 32.0–48.0)
pO2, Arterial: 70.5 mmHg — ABNORMAL LOW (ref 83.0–108.0)

## 2016-10-02 LAB — ECHO TEE
AOVTI: 25 cm
AV Mean grad: 7 mmHg
AV pk vel: 175 cm/s
AVPG: 12 mmHg
AVPHT: 519 ms
DOP CAL AO MEAN VELOCITY: 125 cm/s
FS: 35 % (ref 28–44)
LDCA: 4.52 cm2
LVOT diameter: 24 mm

## 2016-10-02 LAB — POCT I-STAT 4, (NA,K, GLUC, HGB,HCT)
GLUCOSE: 132 mg/dL — AB (ref 65–99)
Glucose, Bld: 134 mg/dL — ABNORMAL HIGH (ref 65–99)
HCT: 30 % — ABNORMAL LOW (ref 39.0–52.0)
HEMATOCRIT: 30 % — AB (ref 39.0–52.0)
HEMOGLOBIN: 10.2 g/dL — AB (ref 13.0–17.0)
Hemoglobin: 10.2 g/dL — ABNORMAL LOW (ref 13.0–17.0)
POTASSIUM: 3.8 mmol/L (ref 3.5–5.1)
Potassium: 4.9 mmol/L (ref 3.5–5.1)
SODIUM: 139 mmol/L (ref 135–145)
SODIUM: 141 mmol/L (ref 135–145)

## 2016-10-02 LAB — BASIC METABOLIC PANEL
Anion gap: 9 (ref 5–15)
BUN: 55 mg/dL — ABNORMAL HIGH (ref 6–20)
CALCIUM: 8.9 mg/dL (ref 8.9–10.3)
CHLORIDE: 99 mmol/L — AB (ref 101–111)
CO2: 29 mmol/L (ref 22–32)
CREATININE: 1.58 mg/dL — AB (ref 0.61–1.24)
GFR calc Af Amer: 50 mL/min — ABNORMAL LOW (ref >60)
GFR calc non Af Amer: 43 mL/min — ABNORMAL LOW (ref >60)
GLUCOSE: 113 mg/dL — AB (ref 65–99)
Potassium: 4.2 mmol/L (ref 3.5–5.1)
Sodium: 137 mmol/L (ref 135–145)

## 2016-10-02 LAB — APTT
aPTT: 50 seconds — ABNORMAL HIGH (ref 24–36)
aPTT: 42 seconds — ABNORMAL HIGH (ref 24–36)

## 2016-10-02 LAB — GLUCOSE, CAPILLARY
GLUCOSE-CAPILLARY: 137 mg/dL — AB (ref 65–99)
GLUCOSE-CAPILLARY: 135 mg/dL — AB (ref 65–99)
GLUCOSE-CAPILLARY: 126 mg/dL — AB (ref 65–99)
GLUCOSE-CAPILLARY: 119 mg/dL — AB (ref 65–99)
Glucose-Capillary: 142 mg/dL — ABNORMAL HIGH (ref 65–99)
Glucose-Capillary: 143 mg/dL — ABNORMAL HIGH (ref 65–99)

## 2016-10-02 LAB — PREPARE RBC (CROSSMATCH)

## 2016-10-02 LAB — PLATELET COUNT: Platelets: 74 10*3/uL — ABNORMAL LOW (ref 150–400)

## 2016-10-02 LAB — HEMOGLOBIN A1C
Hgb A1c MFr Bld: 5.4 % (ref 4.8–5.6)
Mean Plasma Glucose: 108 mg/dL

## 2016-10-02 LAB — HEMOGLOBIN AND HEMATOCRIT, BLOOD
HCT: 23.1 % — ABNORMAL LOW (ref 39.0–52.0)
HEMOGLOBIN: 7.5 g/dL — AB (ref 13.0–17.0)

## 2016-10-02 LAB — PROTIME-INR
INR: 1.93
INR: 1.74
PROTHROMBIN TIME: 22.3 s — AB (ref 11.4–15.2)
PROTHROMBIN TIME: 20.5 s — AB (ref 11.4–15.2)

## 2016-10-02 LAB — FIBRINOGEN: FIBRINOGEN: 201 mg/dL — AB (ref 210–475)

## 2016-10-02 SURGERY — REPLACEMENT, AORTIC VALVE, OPEN
Anesthesia: General | Site: Chest

## 2016-10-02 MED ORDER — CHLORHEXIDINE GLUCONATE 0.12 % MT SOLN: 15.0000 mL | Freq: Two times a day (BID) | OROMUCOSAL | Status: DC

## 2016-10-02 MED ORDER — SODIUM CHLORIDE 0.9 % IV SOLN
250.0000 mL | INTRAVENOUS | Status: DC
  Administered 2016-10-03: 250 mL via INTRAVENOUS

## 2016-10-02 MED ORDER — TRAMADOL HCL 50 MG PO TABS: 50.0000 mg | ORAL_TABLET | ORAL | Status: DC | PRN

## 2016-10-02 MED ORDER — SODIUM CHLORIDE 0.9 % IV SOLN
Freq: Once | INTRAVENOUS | Status: AC
  Administered 2016-10-02: 19:00:00 via INTRAVENOUS

## 2016-10-02 MED ORDER — MIDAZOLAM HCL 5 MG/5ML IJ SOLN
INTRAMUSCULAR | Status: DC | PRN
  Administered 2016-10-02: 3 mg via INTRAVENOUS
  Administered 2016-10-02: 2 mg via INTRAVENOUS
  Administered 2016-10-02 (×5): 1 mg via INTRAVENOUS

## 2016-10-02 MED ORDER — DOPAMINE-DEXTROSE 3.2-5 MG/ML-% IV SOLN
0.0000 ug/kg/min | INTRAVENOUS | Status: DC
  Administered 2016-10-04: 3 ug/kg/min via INTRAVENOUS
  Filled 2016-10-02: qty 250

## 2016-10-02 MED ORDER — SODIUM CHLORIDE 0.45 % IV SOLN
INTRAVENOUS | Status: DC | PRN
  Administered 2016-10-02 – 2016-10-04 (×2): via INTRAVENOUS

## 2016-10-02 MED ORDER — MIDAZOLAM HCL 10 MG/2ML IJ SOLN
INTRAMUSCULAR | Status: AC
  Filled 2016-10-02: qty 2

## 2016-10-02 MED ORDER — SODIUM CHLORIDE 0.9 % IV SOLN
0.0300 [IU]/min | INTRAVENOUS | Status: DC
  Filled 2016-10-02: qty 2

## 2016-10-02 MED ORDER — LACTATED RINGERS IV SOLN
500.0000 mL | Freq: Once | INTRAVENOUS | Status: AC | PRN
  Administered 2016-10-02: 250 mL via INTRAVENOUS
  Administered 2016-10-02: 500 mL via INTRAVENOUS

## 2016-10-02 MED ORDER — SODIUM CHLORIDE 0.9 % IV SOLN
0.0000 ug/min | INTRAVENOUS | Status: DC
  Filled 2016-10-02: qty 2

## 2016-10-02 MED ORDER — ROCURONIUM BROMIDE 100 MG/10ML IV SOLN
INTRAVENOUS | Status: DC | PRN
  Administered 2016-10-02 (×3): 50 mg via INTRAVENOUS
  Administered 2016-10-02: 80 mg via INTRAVENOUS
  Administered 2016-10-02: 20 mg via INTRAVENOUS
  Administered 2016-10-02: 50 mg via INTRAVENOUS

## 2016-10-02 MED ORDER — DEXMEDETOMIDINE HCL IN NACL 400 MCG/100ML IV SOLN
0.4000 ug/kg/h | INTRAVENOUS | Status: DC
  Administered 2016-10-02 – 2016-10-03 (×2): 0.9 ug/kg/h via INTRAVENOUS
  Administered 2016-10-03: 0.5 ug/kg/h via INTRAVENOUS
  Administered 2016-10-03: 0.9 ug/kg/h via INTRAVENOUS
  Filled 2016-10-02 (×4): qty 100

## 2016-10-02 MED ORDER — ALBUMIN HUMAN 5 % IV SOLN
12.5000 g | Freq: Once | INTRAVENOUS | Status: AC
  Administered 2016-10-02: 12.5 g via INTRAVENOUS

## 2016-10-02 MED ORDER — MILRINONE LACTATE IN DEXTROSE 20-5 MG/100ML-% IV SOLN
INTRAVENOUS | Status: DC | PRN
  Administered 2016-10-02: .5 ug/kg/min via INTRAVENOUS

## 2016-10-02 MED ORDER — FUROSEMIDE 10 MG/ML IJ SOLN
40.0000 mg | Freq: Two times a day (BID) | INTRAMUSCULAR | Status: DC
  Administered 2016-10-03: 40 mg via INTRAVENOUS
  Filled 2016-10-02: qty 4

## 2016-10-02 MED ORDER — VASOPRESSIN 20 UNIT/ML IV SOLN
0.0300 [IU]/min | INTRAVENOUS | Status: DC
  Filled 2016-10-02: qty 2

## 2016-10-02 MED ORDER — FENTANYL CITRATE (PF) 250 MCG/5ML IJ SOLN
INTRAMUSCULAR | Status: DC | PRN
  Administered 2016-10-02: 100 ug via INTRAVENOUS
  Administered 2016-10-02: 150 ug via INTRAVENOUS
  Administered 2016-10-02: 100 ug via INTRAVENOUS
  Administered 2016-10-02 (×3): 50 ug via INTRAVENOUS
  Administered 2016-10-02: 100 ug via INTRAVENOUS
  Administered 2016-10-02: 50 ug via INTRAVENOUS
  Administered 2016-10-02: 600 ug via INTRAVENOUS

## 2016-10-02 MED ORDER — ASPIRIN EC 325 MG PO TBEC
325.0000 mg | DELAYED_RELEASE_TABLET | Freq: Every day | ORAL | Status: DC
  Administered 2016-10-04 – 2016-10-07 (×4): 325 mg via ORAL
  Filled 2016-10-02 (×4): qty 1

## 2016-10-02 MED ORDER — POTASSIUM CHLORIDE 10 MEQ/50ML IV SOLN
10.0000 meq | INTRAVENOUS | Status: AC
  Administered 2016-10-02 (×3): 10 meq via INTRAVENOUS

## 2016-10-02 MED ORDER — NITROGLYCERIN IN D5W 200-5 MCG/ML-% IV SOLN: 0.0000 ug/min | INTRAVENOUS | Status: DC

## 2016-10-02 MED ORDER — ACETAMINOPHEN 650 MG RE SUPP
650.0000 mg | Freq: Once | RECTAL | Status: AC
  Administered 2016-10-02: 650 mg via RECTAL

## 2016-10-02 MED ORDER — OXYCODONE HCL 5 MG PO TABS
5.0000 mg | ORAL_TABLET | ORAL | Status: DC | PRN
  Administered 2016-10-03 – 2016-10-05 (×8): 10 mg via ORAL
  Filled 2016-10-02 (×8): qty 2

## 2016-10-02 MED ORDER — HEMOSTATIC AGENTS (NO CHARGE) OPTIME
TOPICAL | Status: DC | PRN
  Administered 2016-10-02: 1 via TOPICAL

## 2016-10-02 MED ORDER — POTASSIUM CHLORIDE 10 MEQ/50ML IV SOLN: 10.0000 meq | INTRAVENOUS | Status: DC

## 2016-10-02 MED ORDER — SODIUM CHLORIDE 0.9 % IV SOLN
0.0000 ug/kg/h | INTRAVENOUS | Status: DC
  Administered 2016-10-02: 0.7 ug/kg/h via INTRAVENOUS
  Filled 2016-10-02: qty 2

## 2016-10-02 MED ORDER — ACETAMINOPHEN 160 MG/5ML PO SOLN
1000.0000 mg | Freq: Four times a day (QID) | ORAL | Status: DC
  Administered 2016-10-02 – 2016-10-03 (×3): 1000 mg
  Filled 2016-10-02 (×3): qty 40.6

## 2016-10-02 MED ORDER — ONDANSETRON HCL 4 MG/2ML IJ SOLN
4.0000 mg | Freq: Four times a day (QID) | INTRAMUSCULAR | Status: DC | PRN
  Administered 2016-10-06: 4 mg via INTRAVENOUS
  Filled 2016-10-02: qty 2

## 2016-10-02 MED ORDER — SODIUM CHLORIDE 0.9 % IR SOLN
Status: DC | PRN
Start: 2016-10-02 — End: 2016-10-02
  Administered 2016-10-02: 1

## 2016-10-02 MED ORDER — FUROSEMIDE 10 MG/ML IJ SOLN
80.0000 mg | Freq: Once | INTRAMUSCULAR | Status: AC
  Administered 2016-10-02: 80 mg via INTRAVENOUS

## 2016-10-02 MED ORDER — SODIUM CHLORIDE 0.9% FLUSH: 3.0000 mL | INTRAVENOUS | Status: DC | PRN

## 2016-10-02 MED ORDER — MAGNESIUM SULFATE 4 GM/100ML IV SOLN
4.0000 g | Freq: Once | INTRAVENOUS | Status: AC
  Administered 2016-10-02: 4 g via INTRAVENOUS
  Filled 2016-10-02: qty 100

## 2016-10-02 MED ORDER — LACTATED RINGERS IV SOLN
INTRAVENOUS | Status: DC
  Administered 2016-10-02 – 2016-10-04 (×2): via INTRAVENOUS

## 2016-10-02 MED ORDER — PANTOPRAZOLE SODIUM 40 MG PO TBEC
40.0000 mg | DELAYED_RELEASE_TABLET | Freq: Every day | ORAL | Status: DC
  Administered 2016-10-04 – 2016-10-15 (×12): 40 mg via ORAL
  Filled 2016-10-02 (×12): qty 1

## 2016-10-02 MED ORDER — MILRINONE LACTATE IN DEXTROSE 20-5 MG/100ML-% IV SOLN
0.3000 ug/kg/min | INTRAVENOUS | Status: DC
  Administered 2016-10-02 – 2016-10-03 (×3): 0.375 ug/kg/min via INTRAVENOUS
  Administered 2016-10-03 – 2016-10-04 (×2): 0.3 ug/kg/min via INTRAVENOUS
  Filled 2016-10-02 (×5): qty 100

## 2016-10-02 MED ORDER — PROTAMINE SULFATE 10 MG/ML IV SOLN
INTRAVENOUS | Status: DC | PRN
  Administered 2016-10-02: 290 mg via INTRAVENOUS
  Administered 2016-10-02: 10 mg via INTRAVENOUS

## 2016-10-02 MED ORDER — ASPIRIN 81 MG PO CHEW: 324.0000 mg | CHEWABLE_TABLET | Freq: Every day | ORAL | Status: DC

## 2016-10-02 MED ORDER — LACTATED RINGERS IV SOLN
INTRAVENOUS | Status: DC | PRN
  Administered 2016-10-02 (×2): via INTRAVENOUS

## 2016-10-02 MED ORDER — SODIUM CHLORIDE 0.9 % IV SOLN
INTRAVENOUS | Status: AC
  Administered 2016-10-02: 17:00:00 via INTRAVENOUS

## 2016-10-02 MED ORDER — SODIUM CHLORIDE 0.9 % IV SOLN
INTRAVENOUS | Status: DC
  Administered 2016-10-02: 17:00:00 via INTRAVENOUS

## 2016-10-02 MED ORDER — SODIUM CHLORIDE 0.9 % IJ SOLN
INTRAMUSCULAR | Status: DC | PRN
  Administered 2016-10-02 (×2): 4 mL via TOPICAL
  Administered 2016-10-02: 1 mL via TOPICAL

## 2016-10-02 MED ORDER — LACTATED RINGERS IV SOLN
INTRAVENOUS | Status: DC
  Administered 2016-10-02: 17:00:00 via INTRAVENOUS

## 2016-10-02 MED ORDER — VASOPRESSIN 20 UNIT/ML IV SOLN
INTRAVENOUS | Status: DC | PRN
  Administered 2016-10-02 (×2): 2 [IU] via INTRAVENOUS

## 2016-10-02 MED ORDER — MILRINONE LACTATE IN DEXTROSE 20-5 MG/100ML-% IV SOLN
0.1250 ug/kg/min | INTRAVENOUS | Status: DC
  Filled 2016-10-02: qty 100

## 2016-10-02 MED ORDER — NOREPINEPHRINE BITARTRATE 1 MG/ML IV SOLN
0.0000 ug/min | INTRAVENOUS | Status: DC
  Administered 2016-10-02: 15 ug/min via INTRAVENOUS
  Administered 2016-10-03: 7 ug/min via INTRAVENOUS
  Administered 2016-10-04: 6 ug/min via INTRAVENOUS
  Filled 2016-10-02 (×2): qty 16

## 2016-10-02 MED ORDER — VASOPRESSIN 20 UNIT/ML IV SOLN
INTRAVENOUS | Status: AC
  Filled 2016-10-02: qty 1

## 2016-10-02 MED ORDER — FAMOTIDINE IN NACL 20-0.9 MG/50ML-% IV SOLN
20.0000 mg | Freq: Two times a day (BID) | INTRAVENOUS | Status: DC
  Administered 2016-10-02 – 2016-10-03 (×2): 20 mg via INTRAVENOUS
  Filled 2016-10-02: qty 50

## 2016-10-02 MED ORDER — BISACODYL 10 MG RE SUPP: 10.0000 mg | Freq: Every day | RECTAL | Status: DC

## 2016-10-02 MED ORDER — VANCOMYCIN HCL IN DEXTROSE 1-5 GM/200ML-% IV SOLN
1000.0000 mg | INTRAVENOUS | Status: DC
  Administered 2016-10-03 – 2016-10-05 (×3): 1000 mg via INTRAVENOUS
  Filled 2016-10-02 (×4): qty 200

## 2016-10-02 MED ORDER — FENTANYL CITRATE (PF) 250 MCG/5ML IJ SOLN
INTRAMUSCULAR | Status: AC
  Filled 2016-10-02: qty 25

## 2016-10-02 MED ORDER — PROPOFOL 10 MG/ML IV BOLUS
INTRAVENOUS | Status: AC
  Filled 2016-10-02: qty 20

## 2016-10-02 MED ORDER — ACETAMINOPHEN 500 MG PO TABS
1000.0000 mg | ORAL_TABLET | Freq: Four times a day (QID) | ORAL | Status: AC
  Administered 2016-10-03 – 2016-10-07 (×14): 1000 mg via ORAL
  Filled 2016-10-02 (×13): qty 2

## 2016-10-02 MED ORDER — LACTATED RINGERS IV SOLN
INTRAVENOUS | Status: DC | PRN
  Administered 2016-10-02 (×3): via INTRAVENOUS

## 2016-10-02 MED ORDER — MORPHINE SULFATE (PF) 4 MG/ML IV SOLN
1.0000 mg | INTRAVENOUS | Status: DC | PRN
  Administered 2016-10-03 (×2): 2 mg via INTRAVENOUS
  Administered 2016-10-03: 4 mg via INTRAVENOUS
  Administered 2016-10-03 – 2016-10-06 (×7): 2 mg via INTRAVENOUS
  Filled 2016-10-02 (×10): qty 1

## 2016-10-02 MED ORDER — VASOPRESSIN 20 UNIT/ML IV SOLN
INTRAVENOUS | Status: DC | PRN
  Administered 2016-10-02: .03 [IU]/min via INTRAVENOUS

## 2016-10-02 MED ORDER — PROPOFOL 10 MG/ML IV BOLUS
INTRAVENOUS | Status: DC | PRN
  Administered 2016-10-02: 50 mg via INTRAVENOUS

## 2016-10-02 MED ORDER — HEPARIN SODIUM (PORCINE) 1000 UNIT/ML IJ SOLN
INTRAMUSCULAR | Status: DC | PRN
  Administered 2016-10-02: 35000 [IU] via INTRAVENOUS

## 2016-10-02 MED ORDER — PHENYLEPHRINE 40 MCG/ML (10ML) SYRINGE FOR IV PUSH (FOR BLOOD PRESSURE SUPPORT)
PREFILLED_SYRINGE | INTRAVENOUS | Status: DC | PRN
  Administered 2016-10-02: 40 ug via INTRAVENOUS

## 2016-10-02 MED ORDER — DEXTROSE 5 % IV SOLN
0.0000 ug/min | INTRAVENOUS | Status: DC
  Filled 2016-10-02: qty 4

## 2016-10-02 MED ORDER — ALBUMIN HUMAN 5 % IV SOLN
INTRAVENOUS | Status: DC | PRN
  Administered 2016-10-02 (×2): via INTRAVENOUS

## 2016-10-02 MED ORDER — MORPHINE SULFATE (PF) 4 MG/ML IV SOLN
1.0000 mg | INTRAVENOUS | Status: AC | PRN
  Administered 2016-10-02: 4 mg via INTRAVENOUS
  Administered 2016-10-02: 2 mg via INTRAVENOUS
  Administered 2016-10-02 – 2016-10-03 (×2): 4 mg via INTRAVENOUS
  Filled 2016-10-02 (×4): qty 1

## 2016-10-02 MED ORDER — MIDAZOLAM HCL 2 MG/2ML IJ SOLN
2.0000 mg | INTRAMUSCULAR | Status: DC | PRN
  Administered 2016-10-02: 2 mg via INTRAVENOUS
  Filled 2016-10-02: qty 2

## 2016-10-02 MED ORDER — VANCOMYCIN HCL IN DEXTROSE 1-5 GM/200ML-% IV SOLN
1000.0000 mg | Freq: Once | INTRAVENOUS | Status: DC
  Filled 2016-10-02: qty 200

## 2016-10-02 MED ORDER — VASOPRESSIN 20 UNIT/ML IV SOLN
0.0000 [IU]/min | INTRAVENOUS | Status: DC
  Administered 2016-10-02: 0.04 [IU]/min via INTRAVENOUS
  Filled 2016-10-02: qty 2

## 2016-10-02 MED ORDER — ORAL CARE MOUTH RINSE
15.0000 mL | Freq: Four times a day (QID) | OROMUCOSAL | Status: DC
  Administered 2016-10-03 (×2): 15 mL via OROMUCOSAL

## 2016-10-02 MED ORDER — METOPROLOL TARTRATE 5 MG/5ML IV SOLN
2.5000 mg | INTRAVENOUS | Status: DC | PRN
  Filled 2016-10-02: qty 5

## 2016-10-02 MED ORDER — DEXTROSE 5 % IV SOLN
2.0000 g | INTRAVENOUS | Status: DC
  Administered 2016-10-04 – 2016-10-08 (×5): 2 g via INTRAVENOUS
  Filled 2016-10-02 (×5): qty 2

## 2016-10-02 MED ORDER — CHLORHEXIDINE GLUCONATE 0.12 % MT SOLN
15.0000 mL | OROMUCOSAL | Status: AC
  Administered 2016-10-02: 15 mL via OROMUCOSAL

## 2016-10-02 MED ORDER — SODIUM CHLORIDE 0.9% FLUSH
3.0000 mL | Freq: Two times a day (BID) | INTRAVENOUS | Status: DC
  Administered 2016-10-03: 3 mL via INTRAVENOUS
  Administered 2016-10-03 – 2016-10-04 (×2): 10 mL via INTRAVENOUS
  Administered 2016-10-04: 3 mL via INTRAVENOUS
  Administered 2016-10-05: 10 mL via INTRAVENOUS
  Administered 2016-10-06 – 2016-10-07 (×4): 3 mL via INTRAVENOUS

## 2016-10-02 MED ORDER — LACTATED RINGERS IV SOLN
INTRAVENOUS | Status: DC | PRN
  Administered 2016-10-02 (×2): via INTRAVENOUS

## 2016-10-02 MED ORDER — ACETAMINOPHEN 160 MG/5ML PO SOLN: 650.0000 mg | Freq: Once | ORAL | Status: AC

## 2016-10-02 MED ORDER — NOREPINEPHRINE BITARTRATE 1 MG/ML IV SOLN
INTRAVENOUS | Status: DC | PRN
  Administered 2016-10-02 (×2): 2 ug/kg/min via INTRAVENOUS

## 2016-10-02 MED ORDER — INSULIN REGULAR BOLUS VIA INFUSION
0.0000 [IU] | Freq: Three times a day (TID) | INTRAVENOUS | Status: DC
  Filled 2016-10-02: qty 10

## 2016-10-02 MED ORDER — ROCURONIUM BROMIDE 10 MG/ML (PF) SYRINGE
PREFILLED_SYRINGE | INTRAVENOUS | Status: AC
  Filled 2016-10-02: qty 15

## 2016-10-02 MED ORDER — DEXTROSE 5 % IV SOLN
1.5000 g | Freq: Two times a day (BID) | INTRAVENOUS | Status: AC
  Administered 2016-10-02 – 2016-10-04 (×4): 1.5 g via INTRAVENOUS
  Filled 2016-10-02 (×4): qty 1.5

## 2016-10-02 MED ORDER — CHLORHEXIDINE GLUCONATE 0.12% ORAL RINSE (MEDLINE KIT)
15.0000 mL | Freq: Two times a day (BID) | OROMUCOSAL | Status: DC
  Administered 2016-10-03: 15 mL via OROMUCOSAL

## 2016-10-02 MED ORDER — DOCUSATE SODIUM 100 MG PO CAPS
200.0000 mg | ORAL_CAPSULE | Freq: Every day | ORAL | Status: DC
  Administered 2016-10-04 – 2016-10-13 (×6): 200 mg via ORAL
  Filled 2016-10-02 (×11): qty 2

## 2016-10-02 MED ORDER — NOREPINEPHRINE BITARTRATE 1 MG/ML IV SOLN
0.0000 ug/min | INTRAVENOUS | Status: DC
  Filled 2016-10-02: qty 4

## 2016-10-02 MED ORDER — PHENYLEPHRINE 40 MCG/ML (10ML) SYRINGE FOR IV PUSH (FOR BLOOD PRESSURE SUPPORT)
PREFILLED_SYRINGE | INTRAVENOUS | Status: AC
  Filled 2016-10-02: qty 10

## 2016-10-02 MED ORDER — ALBUMIN HUMAN 5 % IV SOLN
250.0000 mL | INTRAVENOUS | Status: AC | PRN
  Administered 2016-10-02 (×4): 250 mL via INTRAVENOUS
  Filled 2016-10-02: qty 250

## 2016-10-02 MED ORDER — TRANEXAMIC ACID 1000 MG/10ML IV SOLN
1000.0000 mg | Freq: Once | INTRAVENOUS | Status: DC
  Filled 2016-10-02: qty 10

## 2016-10-02 MED ORDER — BISACODYL 5 MG PO TBEC
10.0000 mg | DELAYED_RELEASE_TABLET | Freq: Every day | ORAL | Status: DC
  Administered 2016-10-04 – 2016-10-13 (×8): 10 mg via ORAL
  Filled 2016-10-02 (×11): qty 2

## 2016-10-02 MED ORDER — SODIUM CHLORIDE 0.9 % IV SOLN
INTRAVENOUS | Status: DC
  Administered 2016-10-03: 5.7 [IU]/h via INTRAVENOUS
  Filled 2016-10-02: qty 1

## 2016-10-02 MED FILL — Heparin Sodium (Porcine) Inj 1000 Unit/ML: INTRAMUSCULAR | Qty: 30 | Status: AC

## 2016-10-02 MED FILL — Potassium Chloride Inj 2 mEq/ML: INTRAVENOUS | Qty: 10 | Status: AC

## 2016-10-02 MED FILL — Magnesium Sulfate Inj 50%: INTRAMUSCULAR | Qty: 10 | Status: AC

## 2016-10-02 SURGICAL SUPPLY — 179 items
ADAPTER CARDIO PERF ANTE/RETRO (ADAPTER) ×4 IMPLANT
APPLICATOR TIP COSEAL (VASCULAR PRODUCTS) ×4 IMPLANT
BAG DECANTER FOR FLEXI CONT (MISCELLANEOUS) ×12 IMPLANT
BANDAGE ACE 4X5 VEL STRL LF (GAUZE/BANDAGES/DRESSINGS) ×4 IMPLANT
BANDAGE ACE 6X5 VEL STRL LF (GAUZE/BANDAGES/DRESSINGS) ×4 IMPLANT
BASKET HEART (ORDER IN 25'S) (MISCELLANEOUS) ×1
BASKET HEART (ORDER IN 25S) (MISCELLANEOUS) ×3 IMPLANT
BENZOIN TINCTURE PRP APPL 2/3 (GAUZE/BANDAGES/DRESSINGS) ×4 IMPLANT
BLADE CLIPPER SURG (BLADE) IMPLANT
BLADE STERNUM SYSTEM 6 (BLADE) ×8 IMPLANT
BLADE SURG 11 STRL SS (BLADE) ×8 IMPLANT
BLADE SURG 15 STRL LF DISP TIS (BLADE) IMPLANT
BLADE SURG 15 STRL SS (BLADE)
BNDG GAUZE ELAST 4 BULKY (GAUZE/BANDAGES/DRESSINGS) ×4 IMPLANT
CANISTER SUCT 3000ML PPV (MISCELLANEOUS) ×8 IMPLANT
CANN PRFSN .5XCNCT 15X34-48 (MISCELLANEOUS)
CANNULA ARTERIAL 007325 (MISCELLANEOUS) IMPLANT
CANNULA ARTERIAL 14F 007324 (MISCELLANEOUS) IMPLANT
CANNULA ARTERIAL 18F 007308 (MISCELLANEOUS) IMPLANT
CANNULA ARTERIAL 20F L7309 (MISCELLANEOUS) IMPLANT
CANNULA ARTERIAL 22F 007310 (MISCELLANEOUS) IMPLANT
CANNULA ARTERIAL 24F 007311 (MISCELLANEOUS) IMPLANT
CANNULA EZ GLIDE AORTIC 21FR (CANNULA) ×8 IMPLANT
CANNULA FEM VENOUS REMOTE 22FR (CANNULA) ×4 IMPLANT
CANNULA GUNDRY RCSP 15FR (MISCELLANEOUS) ×12 IMPLANT
CANNULA PRFSN .5XCNCT 15X34-48 (MISCELLANEOUS) IMPLANT
CANNULA SOFTFLOW AORTIC 7M21FR (CANNULA) ×4 IMPLANT
CANNULA SUMP PERICARDIAL (CANNULA) ×4 IMPLANT
CANNULA VEN 2 STAGE (MISCELLANEOUS)
CANNULA VRC MALB SNGL STG 28FR (MISCELLANEOUS) ×3 IMPLANT
CATH CPB KIT OWEN (MISCELLANEOUS) ×4 IMPLANT
CATH HEART VENT LEFT (CATHETERS) ×3 IMPLANT
CATH THORACIC 28FR (CATHETERS) IMPLANT
CATH THORACIC 36FR (CATHETERS) ×4 IMPLANT
CATH THORACIC 36FR RT ANG (CATHETERS) ×4 IMPLANT
CHLORAPREP W/TINT 10.5 ML (MISCELLANEOUS) ×4 IMPLANT
CLIP TI MEDIUM 24 (CLIP) IMPLANT
CLIP TI MEDIUM 6 (CLIP) IMPLANT
CLIP TI WIDE RED SMALL 24 (CLIP) IMPLANT
CLIP TI WIDE RED SMALL 6 (CLIP) IMPLANT
CONN 1/2X1/2X1/2  BEN (MISCELLANEOUS)
CONN 1/2X1/2X1/2 BEN (MISCELLANEOUS) IMPLANT
CONN 3/8X3/8 GISH STERILE (MISCELLANEOUS) IMPLANT
CONN ST 1/4X3/8  BEN (MISCELLANEOUS) ×2
CONN ST 1/4X3/8 BEN (MISCELLANEOUS) ×6 IMPLANT
CONN Y 3/8X3/8X3/8  BEN (MISCELLANEOUS)
CONN Y 3/8X3/8X3/8 BEN (MISCELLANEOUS) IMPLANT
CONNECTOR 1/2X3/8X1/2 3 WAY (MISCELLANEOUS) ×1
CONNECTOR 1/2X3/8X1/2 3WAY (MISCELLANEOUS) ×3 IMPLANT
CONT SPEC 4OZ CLIKSEAL STRL BL (MISCELLANEOUS) ×12 IMPLANT
COUNTER NEEDLE 20 DBL MAG RED (NEEDLE) ×4 IMPLANT
COVER MAYO STAND STRL (DRAPES) ×4 IMPLANT
COVER PROBE W GEL 5X96 (DRAPES) ×4 IMPLANT
COVER SURGICAL LIGHT HANDLE (MISCELLANEOUS) ×4 IMPLANT
CRADLE DONUT ADULT HEAD (MISCELLANEOUS) ×4 IMPLANT
DEVICE SUT CK QUICK LOAD MINI (Prosthesis & Implant Heart) IMPLANT
DRAIN CHANNEL 32F RND 10.7 FF (WOUND CARE) ×12 IMPLANT
DRAPE BILATERAL SPLIT (DRAPES) IMPLANT
DRAPE CARDIOVASCULAR INCISE (DRAPES) ×1
DRAPE CV SPLIT W-CLR ANES SCRN (DRAPES) IMPLANT
DRAPE INCISE IOBAN 66X45 STRL (DRAPES) ×8 IMPLANT
DRAPE SLUSH/WARMER DISC (DRAPES) ×4 IMPLANT
DRAPE SRG 135X102X78XABS (DRAPES) ×3 IMPLANT
DRSG COVADERM 4X14 (GAUZE/BANDAGES/DRESSINGS) ×4 IMPLANT
ELECT BLADE 4.0 EZ CLEAN MEGAD (MISCELLANEOUS) ×4
ELECT REM PT RETURN 9FT ADLT (ELECTROSURGICAL) ×8
ELECTRODE BLDE 4.0 EZ CLN MEGD (MISCELLANEOUS) ×3 IMPLANT
ELECTRODE REM PT RTRN 9FT ADLT (ELECTROSURGICAL) ×6 IMPLANT
FELT TEFLON 1X6 (MISCELLANEOUS) ×8 IMPLANT
FELT TEFLON 6X6 (MISCELLANEOUS) IMPLANT
GAUZE SPONGE 4X4 12PLY STRL (GAUZE/BANDAGES/DRESSINGS) ×16 IMPLANT
GAUZE SPONGE 4X4 12PLY STRL LF (GAUZE/BANDAGES/DRESSINGS) ×8 IMPLANT
GLOVE BIO SURGEON STRL SZ 6 (GLOVE) IMPLANT
GLOVE BIO SURGEON STRL SZ 6.5 (GLOVE) ×20 IMPLANT
GLOVE BIO SURGEON STRL SZ7 (GLOVE) ×20 IMPLANT
GLOVE BIO SURGEON STRL SZ7.5 (GLOVE) IMPLANT
GLOVE ORTHO TXT STRL SZ7.5 (GLOVE) ×12 IMPLANT
GOWN STRL REUS W/ TWL LRG LVL3 (GOWN DISPOSABLE) ×24 IMPLANT
GOWN STRL REUS W/TWL LRG LVL3 (GOWN DISPOSABLE) ×8
HEMOSTAT POWDER SURGIFOAM 1G (HEMOSTASIS) ×20 IMPLANT
INSERT FOGARTY SM (MISCELLANEOUS) IMPLANT
INSERT FOGARTY XLG (MISCELLANEOUS) ×8 IMPLANT
IV LACTATED RINGERS 500ML (IV SOLUTION) ×8 IMPLANT
KIT BASIN OR (CUSTOM PROCEDURE TRAY) ×4 IMPLANT
KIT DILATOR VASC 18G NDL (KITS) ×4 IMPLANT
KIT DRAINAGE VACCUM ASSIST (KITS) ×4 IMPLANT
KIT ROOM TURNOVER OR (KITS) ×4 IMPLANT
KIT SUCTION CATH 14FR (SUCTIONS) ×16 IMPLANT
KIT SUT CK MINI COMBO 4X17 (Prosthesis & Implant Heart) IMPLANT
KIT VASOVIEW HEMOPRO VH 3000 (KITS) ×4 IMPLANT
LEAD PACING MYOCARDI (MISCELLANEOUS) ×8 IMPLANT
LINE VENT (MISCELLANEOUS) ×4 IMPLANT
LOOP VESSEL SUPERMAXI WHITE (MISCELLANEOUS) IMPLANT
MARKER GRAFT CORONARY BYPASS (MISCELLANEOUS) ×12 IMPLANT
NEEDLE AORTIC AIR ASPIRATING (NEEDLE) IMPLANT
NS IRRIG 1000ML POUR BTL (IV SOLUTION) ×20 IMPLANT
PACK OPEN HEART (CUSTOM PROCEDURE TRAY) ×4 IMPLANT
PAD ARMBOARD 7.5X6 YLW CONV (MISCELLANEOUS) ×16 IMPLANT
PAD ELECT DEFIB RADIOL ZOLL (MISCELLANEOUS) ×4 IMPLANT
PENCIL BUTTON HOLSTER BLD 10FT (ELECTRODE) ×4 IMPLANT
PUNCH AORTIC ROTATE  4.5MM 8IN (MISCELLANEOUS) ×4 IMPLANT
PUNCH AORTIC ROTATE 4.0MM (MISCELLANEOUS) IMPLANT
PUNCH AORTIC ROTATE 4.5MM 8IN (MISCELLANEOUS) IMPLANT
PUNCH AORTIC ROTATE 5MM 8IN (MISCELLANEOUS) IMPLANT
SET CANNULATION TOURNIQUET (MISCELLANEOUS) ×4 IMPLANT
SET CARDIOPLEGIA MPS 5001102 (MISCELLANEOUS) ×4 IMPLANT
SET IRRIG TUBING LAPAROSCOPIC (IRRIGATION / IRRIGATOR) ×4 IMPLANT
SOLUTION ANTI FOG 6CC (MISCELLANEOUS) ×4 IMPLANT
SPONGE LAP 18X18 X RAY DECT (DISPOSABLE) ×4 IMPLANT
SPONGE LAP 4X18 X RAY DECT (DISPOSABLE) ×8 IMPLANT
STOPCOCK 4 WAY LG BORE MALE ST (IV SETS) IMPLANT
SUT BONE WAX W31G (SUTURE) ×4 IMPLANT
SUT ETHIBON 2 0 V 52N 30 (SUTURE) ×8 IMPLANT
SUT ETHIBON EXCEL 2-0 V-5 (SUTURE) IMPLANT
SUT ETHIBOND 2 0 SH (SUTURE)
SUT ETHIBOND 2 0 SH 36X2 (SUTURE) IMPLANT
SUT ETHIBOND 2 0 V4 (SUTURE) IMPLANT
SUT ETHIBOND 2 0V4 GREEN (SUTURE) IMPLANT
SUT ETHIBOND 4 0 RB 1 (SUTURE) ×20 IMPLANT
SUT ETHIBOND V-5 VALVE (SUTURE) IMPLANT
SUT ETHIBOND X763 2 0 SH 1 (SUTURE) ×8 IMPLANT
SUT MNCRL AB 3-0 PS2 18 (SUTURE) ×16 IMPLANT
SUT MNCRL AB 4-0 PS2 18 (SUTURE) IMPLANT
SUT PDS AB 1 CTX 36 (SUTURE) ×16 IMPLANT
SUT PROLENE 2 0 SH DA (SUTURE) IMPLANT
SUT PROLENE 3 0 RB 1 (SUTURE) IMPLANT
SUT PROLENE 3 0 SH DA (SUTURE) ×16 IMPLANT
SUT PROLENE 3 0 SH1 36 (SUTURE) ×44 IMPLANT
SUT PROLENE 4 0 RB 1 (SUTURE) ×7
SUT PROLENE 4 0 SH DA (SUTURE) ×12 IMPLANT
SUT PROLENE 4-0 RB1 .5 CRCL 36 (SUTURE) ×21 IMPLANT
SUT PROLENE 5 0 C 1 36 (SUTURE) ×8 IMPLANT
SUT PROLENE 6 0 C 1 30 (SUTURE) IMPLANT
SUT PROLENE 7.0 RB 3 (SUTURE) ×12 IMPLANT
SUT PROLENE 8 0 BV175 6 (SUTURE) IMPLANT
SUT PROLENE BLUE 7 0 (SUTURE) ×4 IMPLANT
SUT PROLENE POLY MONO (SUTURE) IMPLANT
SUT SILK  1 MH (SUTURE) ×3
SUT SILK 1 MH (SUTURE) ×9 IMPLANT
SUT SILK 2 0 SH CR/8 (SUTURE) ×8 IMPLANT
SUT SILK 3 0 SH CR/8 (SUTURE) IMPLANT
SUT STEEL 6MS V (SUTURE) IMPLANT
SUT STEEL STERNAL CCS#1 18IN (SUTURE) ×4 IMPLANT
SUT STEEL SZ 6 DBL 3X14 BALL (SUTURE) ×8 IMPLANT
SUT VIC AB 1 CT1 18XCR BRD 8 (SUTURE) IMPLANT
SUT VIC AB 1 CT1 8-18 (SUTURE)
SUT VIC AB 1 CTX 27 (SUTURE) IMPLANT
SUT VIC AB 1 CTX 36 (SUTURE)
SUT VIC AB 1 CTX36XBRD ANBCTR (SUTURE) IMPLANT
SUT VIC AB 2-0 CT1 27 (SUTURE) ×1
SUT VIC AB 2-0 CT1 TAPERPNT 27 (SUTURE) ×3 IMPLANT
SUT VIC AB 2-0 CTX 27 (SUTURE) IMPLANT
SUT VIC AB 2-0 CTX 36 (SUTURE) ×8 IMPLANT
SUT VIC AB 3-0 SH 27 (SUTURE)
SUT VIC AB 3-0 SH 27X BRD (SUTURE) IMPLANT
SUT VIC AB 3-0 X1 27 (SUTURE) ×4 IMPLANT
SUT VICRYL 4-0 PS2 18IN ABS (SUTURE) IMPLANT
SUTURE E-PAK OPEN HEART (SUTURE) ×4 IMPLANT
SWAB COLLECTION DEVICE MRSA (MISCELLANEOUS) ×4 IMPLANT
SYR 10ML KIT SKIN ADHESIVE (MISCELLANEOUS) IMPLANT
SYSTEM SAHARA CHEST DRAIN ATS (WOUND CARE) ×8 IMPLANT
TAPE CLOTH SURG 4X10 WHT LF (GAUZE/BANDAGES/DRESSINGS) ×4 IMPLANT
TAPE PAPER 2X10 WHT MICROPORE (GAUZE/BANDAGES/DRESSINGS) ×4 IMPLANT
TAPE UMBILICAL COTTON 1/8X30 (MISCELLANEOUS) ×4 IMPLANT
TOWEL GREEN STERILE (TOWEL DISPOSABLE) ×4 IMPLANT
TOWEL GREEN STERILE FF (TOWEL DISPOSABLE) ×8 IMPLANT
TOWEL OR 17X24 6PK STRL BLUE (TOWEL DISPOSABLE) ×12 IMPLANT
TOWEL OR 17X26 10 PK STRL BLUE (TOWEL DISPOSABLE) ×12 IMPLANT
TRAY CATH LUMEN 1 20CM STRL (SET/KITS/TRAYS/PACK) IMPLANT
TRAY FOLEY SILVER 16FR TEMP (SET/KITS/TRAYS/PACK) ×4 IMPLANT
TUBE ANAEROBIC SPECIMEN COL (MISCELLANEOUS) ×4 IMPLANT
TUBE CONNECTING 20X1/4 (TUBING) ×4 IMPLANT
TUBING INSUFFLATION (TUBING) ×4 IMPLANT
UNDERPAD 30X30 (UNDERPADS AND DIAPERS) ×4 IMPLANT
VALVE AORTIC CRYO 25MM (Prosthesis & Implant Heart) ×4 IMPLANT
VENT LEFT HEART 12002 (CATHETERS) ×4
VRC MALLEABLE SINGLE STG 28FR (MISCELLANEOUS) ×4
WATER STERILE IRR 1000ML POUR (IV SOLUTION) ×8 IMPLANT
YANKAUER SUCT BULB TIP NO VENT (SUCTIONS) ×4 IMPLANT

## 2016-10-02 NOTE — Progress Notes (Signed)
RT NOTE:  ABG PaO2: 51 Dr. Prescott Gum made the following vent changes: VT increased to 750 mls Peep increased to 10 Hold vent recruitments at this time.

## 2016-10-02 NOTE — Progress Notes (Signed)
This note also relates to the following rows which could not be included: Temp - Cannot attach notes to unvalidated device data Resp - Cannot attach notes to unvalidated device data SpO2 - Cannot attach notes to unvalidated device data  Recruitment maneuver completed for 2 minutes due to desat to 80%. PC 30, RR 10, peep 5, iT 3.00, FIO2 100%.  Sat improved to 99%.  Pt placed back on previous vent settings.

## 2016-10-02 NOTE — Anesthesia Procedure Notes (Signed)
Procedure Name: Intubation Date/Time: 10/02/2016 7:54 AM Performed by: Clearnce Sorrel Pre-anesthesia Checklist: Patient identified, Emergency Drugs available, Suction available, Patient being monitored and Timeout performed Patient Re-evaluated:Patient Re-evaluated prior to inductionOxygen Delivery Method: Circle system utilized Preoxygenation: Pre-oxygenation with 100% oxygen Intubation Type: IV induction Ventilation: Two handed mask ventilation required, Oral airway inserted - appropriate to patient size and Mask ventilation with difficulty Laryngoscope Size: Mac, 4 and Glidescope Grade View: Grade II Tube type: Subglottic suction tube Tube size: 8.0 mm Number of attempts: 2 Airway Equipment and Method: Stylet Placement Confirmation: ETT inserted through vocal cords under direct vision,  positive ETCO2 and breath sounds checked- equal and bilateral Secured at: 23 cm Tube secured with: Tape Dental Injury: Teeth and Oropharynx as per pre-operative assessment

## 2016-10-02 NOTE — Progress Notes (Signed)
CT surgery p.m. Rounds  PaO2 51 PEEP increased to 10 cm H2O Responding well to IV diuretics Repeat Lasix 40 mg IV to maintain urine output urine output

## 2016-10-02 NOTE — Progress Notes (Signed)
This note also relates to the following rows which could not be included: Temp - Cannot attach notes to unvalidated device data Pulse Rate - Cannot attach notes to unvalidated device data  Recruitment maneuver completed for 2 minutes due to desat to 81%. PC 30, RR 10, iT 3.00, peep 5, FIO2 100%.  Sat improved to 98%.  Pt placed back on previous vent settings.

## 2016-10-02 NOTE — Progress Notes (Signed)
  Echocardiogram Echocardiogram Transesophageal has been performed.  Charles [name] 10/02/2016, 9:26 AM

## 2016-10-02 NOTE — Anesthesia Postprocedure Evaluation (Signed)
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 EDMOND

## 2016-10-02 NOTE — Progress Notes (Signed)
This note also relates to the following rows which could not be included: Temp - Cannot attach notes to unvalidated device data  RT called to pt's room due to desat at 87%.  Recruitment maneuver completed for 2 minutes: PC 30, RR 10, Peep 5, iT 3.00, FIO2 100%.  Sat improved to 99%.  Pt returned to previous vent settings.

## 2016-10-02 NOTE — Anesthesia Procedure Notes (Signed)
Central Venous Catheter Insertion Performed by: Albertha Ghee, anesthesiologist Start/End6/22/2018 6:47 AM, 10/02/2016 7:01 AM Patient location: Pre-op. Preanesthetic checklist: patient identified, IV checked, site marked, risks and benefits discussed, surgical consent, monitors and equipment checked, pre-op evaluation, timeout performed and anesthesia consent Position: Trendelenburg Lidocaine 1% used for infiltration and patient sedated Hand hygiene performed , maximum sterile barriers used  and Seldinger technique used Catheter size: 9 Fr Central line was placed.Sheath introducer Swan type:thermodilation Procedure performed using ultrasound guided technique. Ultrasound Notes:anatomy identified, needle tip was noted to be adjacent to the nerve/plexus identified, no ultrasound evidence of intravascular and/or intraneural injection and image(s) printed for medical record Attempts: 1 Following insertion, line sutured, dressing applied and Biopatch. Post procedure assessment: blood return through all ports, free fluid flow and no air  Patient tolerated the procedure well with no immediate complications.

## 2016-10-02 NOTE — Op Note (Addendum)
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 I had the opportunity to evaluate the patient in consultation during his hospitalization on 07/04/2016. 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 The jet of aortic insufficiency was quite eccentric and there was questions regarding the possibility of leaflet perforation. CT angiogram of the chest confirmed the presence of moderate aneurysmal enlargement of the aorta with maximum transverse diameter 4.4cm. The patient did not have symptoms or signs of congestive heart failure at the time and he responded well to medical therapy for endocarditis. He completed his course of intravenous antibiotics as an outpatient and was seen in follow-up by Dr. Megan Salon from infectious disease and Dr. Domenic Polite in cardiology. He underwent diagnostic cardiac catheterization on 08/03/2016 revealing the presence of 70% ostial stenosis of the right coronary artery that was felt to be severe because of the degree of dampening pressure with catheter engagement. There was mild nonobstructive coronary artery disease involving the left anterior descending coronary artery and left circumflex coronary artery. Left ventricular end-diastolic pressure was mildly elevated at 24 mmHg. The patient recently underwent dental extraction by Dr. Enrique Sack and he returned to our office for follow-up on 08/17/2016.  At that point the patient claims to be essentially asymptomatic and remained reluctant to consider proceeding with elective aortic valve replacement and coronary artery bypass grafting, despite the fact that there were concerns regarding the possibility that he may have perforation of his aortic valve with questions about the chronicity of aortic insufficiency. He decided to seek a second opinion  and apparently was evaluated in the office by Dr. Einar Gip who also encouraged him to consider proceeding with surgery.  Medications were adjusted at that time and apparently the patient was taken off of Lasix. Since then he developed worsening symptoms of shortness of breath and lower extremity edema. Symptoms progressed until 09/24/2016 when he presented acutely to the emergency room at [facility] with resting shortness of breath.  He also reported some symptoms of burning substernal chest pain that occur only with activity. Troponin levels were mildly elevated and portable chest x-ray revealed congestive heart failure.  The patient was admitted to [facility] for further management. He has since then improved with medical therapy and follow up cardiothoracic surgical consultation was requested.    DETAILS OF THE OPERATIVE PROCEDURE  Preparation:  The patient is brought to the operating room on the above mentioned date and central monitoring was established by the anesthesia team including placement of Swan-Ganz catheter and radial arterial line. The patient had severe pulmonary hypertension at baseline. The patient is placed in the supine position on the operating table.  Intravenous antibiotics are administered. General endotracheal anesthesia is induced with some difficulty as the patient could not tolerate lying flat on bed because of severe hypoxemia. A Foley catheter is placed.  Baseline transesophageal echocardiogram was performed.  Findings were notable for wide open aortic insufficiency. Left ventricle was dilated but contractility appeared normal. There was moderate (2+) central mitral regurgitation. There was a patent foramen ovale with left-to-right shunting.  The patient's chest, abdomen, both groins, and both lower extremities are prepared and draped in a sterile manner. A time out procedure is performed.   Surgical Approach and Conduit Harvest:  The greater  saphenous vein is obtained from the patient's right thigh using endoscopic vein harvest technique. The saphenous vein is notably good quality conduit. After removal of the saphenous vein, the small surgical incisions in the lower extremity are closed with absorbable suture.  A median sternotomy incision was performed.  Both pleural spaces were opened and large bilateral pleural effusions were drained.   Extracorporeal Cardiopulmonary Bypass and Myocardial Protection:  The pericardium is opened. The ascending aorta is dilated proximally but otherwise normal in appearance. There was acute inflammation surrounding the proximal aorta. The right common femoral vein is cannulated with Seldinger technique and a flexible guidewire advanced under TEE guidance through the right atrium. The patient is heparinized systemically. A long femoral venous cannula is advanced under TEE guidance through the right femoral vein up through the inferior vena cava into the right atrium. The ascending aorta is cannulated for cardiopulmonary bypass.  Adequate heparinization is verified.   The entire pre-bypass portion of the operation was notable for stable hemodynamics.  Cardiopulmonary bypass was begun and a second venous cannula is placed through the right atrium into the superior vena cava.  Attempts to place a retrograde cardioplegia cannula into the coronary sinus were unsuccessful.  The operative field was continuously flooded with carbon dioxide gas.  A left ventricular vent placed through the right superior pulmonary vein.  The surface of the heart inspected. Distal target vessels are selected for coronary artery bypass grafting. A temperature probe was placed in the interventricular septum.  Umbilical tapes are placed around the superior vena cava and the inferior vena cava. A small incision is made in the lateral wall of the right atrium. A retrograde cardioplegia cannula is placed under direct vision into the coronary  sinus.  The patient is cooled to 28C systemic temperature.  The aortic cross clamp is applied and cardioplegia is delivered retrograde through the coronary sinus  catheter using modified [place] cold blood cardioplegia (KBC protocol).   The initial cardioplegic arrest is rapid with early diastolic arrest.  Repeat doses of cardioplegia are administered at 90 minutes and every 30 minutes thereafter through the coronary sinus catheter and down the subsequently placed vein graft in order to maintain completely flat electrocardiogram.  Myocardial protection was felt to be excellent.   Coronary Artery Bypass Grafting:  The distal right coronary artery was grafted using a reversed greater saphenous vein graft in an end-to-side fashion.  At the site of distal anastomosis the target vessel was good quality and measured approximately 2.2 mm in diameter.   Closure of Patent Foramen Ovale:  The intra-atrial septum is inspected to the incision in the right atrium. An obvious patent foramen ovale is noted in the fossa ovalis. The patent foramen ovale is closed using running 4-0 Prolene suture.  The right atriotomy incision is closed using running 4-0 Prolene suture.   Resection of Ascending Thoracic Aortic Aneurysm:  The ascending aorta is dissected away from the pulmonary artery. There is aneurysmal enlargement of the proximal aorta with dense surrounding inflammation. An incision is made in the atrial wall of the aorta approximately 1 cm above the origin of the right coronary artery and extended in both directions. The aorta is then transected with care to identify the origin of the left main coronary artery. The ascending thoracic aortic aneurysm is resected leaving a small rim of normal aorta just below the aortic cross-clamp.   Aortic Root Replacement:  The aortic valve and aortic root are examined. The aortic valve is essentially completely destroyed with signs of active vegetations. Although anatomy  is difficult to tell for certain, the valve appeared to likely have been congenitally bicuspid with a single raphe between the left and right leaflets. This is difficult to tell for certain because of the amount of leaflet perforation and destruction. A portion of vegetation is sent for stat Gram stain and culture. Swab cultures are obtained. All aortic valve leaflet tissue are excised. There is a large false aneurysm extending beneath the commissure between the left and right sinus of Valsalva. This false aneurysm tunnels beneath the origin left main coronary artery and is approximately 2 cm in diameter. There is a second smaller false aneurysm beneath the noncoronary sinus of Valsalva. There is no sign of active purulent material and these areas. All the devitalized tissue was debrided sharply.  The aortic root was irrigated using pulse lavage saline irrigation followed by irrigation containing vancomycin solution.  A decision is made to proceed with aortic root replacement using a cryopreserved human allograft aortic root graft.  The aortic root is quite large measuring greater than 28 mm in diameter. The largest size cryopreserved human allograft aortic root graft available (size 25 mm, [employer] ID# [id number]) was thawed per manufacture recommendations and prepared for implantation.  Once the graft was thawed the proximal end is trimmed.  The valve leaflets are examined. There is mild sclerosis of the leaflets but overall there appears been normal mobility and no fenestrations.  The left main coronary artery is mobilized on the coronary button.  There is severe ostial stenosis of the right coronary artery. The ostial right coronary artery is subsequently oversewn at its origin. The remainder of the sinuses of Valsalva are excised.  The proximal suture line of the aortic root graft is performed using interrupted simple 4-0 Ethibond sutures.  The aortic root graft seats nicely. The aortic root is tested  with saline to make sure that the valve remains competent.  The left main coronary arteries reimplanted onto the left sinus of Valsalva of the aortic root graft using running 5-0 Prolene suture. The distal end of the graft is beveled and trimmed to an appropriate length and sewn directly to the ascending aorta just below the aortic cross-clamp.   Procedure Completion:  The single proximal vein graft anastomosis was placed directly to the ascending aorta prior to removal of the aortic cross clamp.  One final dose of warm retrograde "reanimation dose" cardioplegia was administered through the coronary sinus catheter while all air was evacuated through the aortic root.  The aortic cross clamp was removed after a total cross clamp time of 220 minutes.  The aortic root graft was inspected for meticulous hemostasis. The proximal and distal coronary anastomoses were inspected for hemostasis and appropriate graft orientation. Epicardial pacing wires are fixed to the right ventricular outflow tract and to the right atrial appendage. The patient is rewarmed to 37C temperature. The left ventricular vent and superior vena cava cannula were removed.  The patient is weaned and disconnected from cardiopulmonary bypass.  The patient's rhythm at separation from bypass was sinus.  The patient was weaned from cardiopulmonary bypass on milrinone at 0.05 mcg/kg/min, dopamine at 10 mcg/kg/min and low dose levophed. Total cardiopulmonary bypass time for the operation was 300 minutes.  Shortly after separation from bypass dopamine was weaned to 3 mcg/kg/m because of tachycardia. Vasopressin was later added because of vasodilatation with high output and low systemic blood pressure.  Followup transesophageal echocardiogram performed after separation from bypass revealed initially sluggish left ventricular function which progressively recovered and return to normal.  Aortic valve appeared to be functioning normally. There was no  residual mitral regurgitation. There was a small central jet of aortic insufficiency. This persisted and intraoperative consultation with Dr. Aundra Dubin was obtained to further evaluate the aortic insufficiency and make certain there was no sign of perforation or damage to the human allograft valve. With careful examination the central aortic insufficiency remained stable with normal leaflet mobility and no sign of damage to the leaflets. The jet of aortic insufficiency was purely central and graded mild to moderate at most.  Diastolic blood pressure remained entirely normal and there were no hemodynamic findings to suggest more significant aortic insufficiency.  The aortic cannula was removed uneventfully. Protamine was administered to reverse the anticoagulation. The femoral venous cannula was removed and manual pressure held on the right groin for 30 minutes. The mediastinum and both pleural spaces were inspected for hemostasis and irrigated with saline solution.  The patient received a total of 2 packs adult platelets and 2 units fresh frozen plasma due to coagulopathy and thrombocytopenia after separation from cardiopulmonary bypass and reversal of heparin with protamine.  The mediastinum and both pleural space were drained using 4 chest tubes placed through separate stab incisions inferiorly.  The soft tissues anterior to the aorta were reapproximated loosely. The sternum is closed with double strength sternal wire. The soft tissues anterior to the sternum were closed in multiple layers and the skin is closed with a running subcuticular skin closure.  The post-bypass portion of the operation was notable for stable rhythm and hemodynamics.   The patient received 2 units packed red blood cells during the procedure due to anemia which was present preoperatively and exacerbated by acute blood loss and hemodilution during cardiopulmonary bypass.      Disposition:  The patient tolerated the procedure  reasonably well and was transported to the surgical intensive care in critical but stable condition. There are no intraoperative complications. All sponge instrument and needle counts are verified correct at completion of the operation.   Valentina Gu. Roxy Manns MD 10/02/2016 4:17 PM

## 2016-10-02 NOTE — Brief Op Note (Addendum)
PREOP DIAGNOSIS:  Severe aortic insufficiency, CAD, ascending thoracic aortic aneurysm  POSTOP DIAGNOSIS: Same with aortic root abscess  PROCEDURE:   1. Human allograft aortic root replacement 2. Repair of false aneurysm of aortic root 3. Resection and grafting of ascending thoracic aortic aneurysm 4. Coronary artery bypass grafting x1 using reversed greater saphenous vein graft 5. 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

## 2016-10-02 NOTE — Progress Notes (Signed)
Vent changes made per Dr. Guy Sandifer verbal order.

## 2016-10-02 NOTE — Progress Notes (Signed)
This note also relates to the following rows which could not be included: Temp - Cannot attach notes to unvalidated device data Pulse Rate - Cannot attach notes to unvalidated device data SpO2 - Cannot attach notes to unvalidated device data  Recruitment maneuver completed for 2 minutes due to desat to 82%. PC 30, RR 10, Peep 5, FIO2 100%, iT 3.00.  Sat improved to 100%. Pt placed back on previous vent settings.

## 2016-10-02 NOTE — Transfer of Care (Signed)
Immediate Anesthesia Transfer of Care Note  Patient: Charles [name]  Procedure(s) Performed: Procedure(s): AORTIC ROOT REPLACEMENT  - using 60mm Allograft (N/A) CORONARY ARTERY BYPASS GRAFTING (CABG) x one, using right leg greater saphenous vein harvested endoscopically (N/A) THORACIC ASCENDING ANEURYSM REPAIR  - using 27mm Allograft;  Reimplantation of Left Coronary Button (N/A) TRANSESOPHAGEAL ECHOCARDIOGRAM (TEE) (N/A) PATENT FORAMEN OVALE (PFO) CLOSURE (N/A) REPAIR FALSE ANEURYSM of Aortic Root Resection and Grafting of Aortic Root  Patient Location: SICU  Anesthesia Type:General  Level of Consciousness: Patient remains intubated per anesthesia plan  Airway & Oxygen Therapy: Patient remains intubated per anesthesia plan  Post-op Assessment: Report given to RN and Post -op Vital signs reviewed and stable  Post vital signs: Reviewed and stable  Last Vitals:  Vitals:   10/02/16 0432 10/02/16 1640  BP: (!) 100/35 (!) 80/50  Pulse:  81  Resp:  12  Temp:      Last Pain:  Vitals:   10/02/16 0428  TempSrc: Axillary  PainSc:       Patients Stated Pain Goal: 0 (28/41/32 4401)  Complications: No apparent anesthesia complications

## 2016-10-02 NOTE — Progress Notes (Signed)
Rt arrived from OR and placed on vent.  Pt's sat 86% Recruitment maneuver completed for 2 minutes: PC 30, RR 10, Peep 5, iT 3.00  Sat recovered to 98%.

## 2016-10-03 ENCOUNTER — Inpatient Hospital Stay (HOSPITAL_COMMUNITY): Payer: Medicare Other

## 2016-10-03 DIAGNOSIS — Z954 Presence of other heart-valve replacement: Secondary | ICD-10-CM

## 2016-10-03 LAB — GLUCOSE, CAPILLARY
GLUCOSE-CAPILLARY: 155 mg/dL — AB (ref 65–99)
GLUCOSE-CAPILLARY: 128 mg/dL — AB (ref 65–99)
GLUCOSE-CAPILLARY: 118 mg/dL — AB (ref 65–99)
GLUCOSE-CAPILLARY: 119 mg/dL — AB (ref 65–99)
GLUCOSE-CAPILLARY: 104 mg/dL — AB (ref 65–99)
GLUCOSE-CAPILLARY: 101 mg/dL — AB (ref 65–99)
Glucose-Capillary: 111 mg/dL — ABNORMAL HIGH (ref 65–99)
Glucose-Capillary: 115 mg/dL — ABNORMAL HIGH (ref 65–99)
Glucose-Capillary: 117 mg/dL — ABNORMAL HIGH (ref 65–99)
Glucose-Capillary: 123 mg/dL — ABNORMAL HIGH (ref 65–99)
Glucose-Capillary: 124 mg/dL — ABNORMAL HIGH (ref 65–99)
Glucose-Capillary: 127 mg/dL — ABNORMAL HIGH (ref 65–99)
Glucose-Capillary: 129 mg/dL — ABNORMAL HIGH (ref 65–99)
Glucose-Capillary: 94 mg/dL (ref 65–99)

## 2016-10-03 LAB — POCT I-STAT 3, ART BLOOD GAS (G3+)
Acid-Base Excess: 1 mmol/L (ref 0.0–2.0)
Acid-Base Excess: 2 mmol/L (ref 0.0–2.0)
Acid-Base Excess: 1 mmol/L (ref 0.0–2.0)
BICARBONATE: 23.2 mmol/L (ref 20.0–28.0)
BICARBONATE: 23.6 mmol/L (ref 20.0–28.0)
Bicarbonate: 24.9 mmol/L (ref 20.0–28.0)
Bicarbonate: 25.4 mmol/L (ref 20.0–28.0)
Bicarbonate: 25.1 mmol/L (ref 20.0–28.0)
O2 SAT: 100 %
O2 SAT: 94 %
O2 Saturation: 97 %
O2 Saturation: 89 %
O2 Saturation: 92 %
PCO2 ART: 34.6 mmHg (ref 32.0–48.0)
PCO2 ART: 33.9 mmHg (ref 32.0–48.0)
PCO2 ART: 31.9 mmHg — AB (ref 32.0–48.0)
PCO2 ART: 34.4 mmHg (ref 32.0–48.0)
PCO2 ART: 33.9 mmHg (ref 32.0–48.0)
PH ART: 7.465 — AB (ref 7.350–7.450)
PH ART: 7.482 — AB (ref 7.350–7.450)
PH ART: 7.47 — AB (ref 7.350–7.450)
PH ART: 7.47 — AB (ref 7.350–7.450)
PH ART: 7.451 — AB (ref 7.350–7.450)
PO2 ART: 52 mmHg — AB (ref 83.0–108.0)
PO2 ART: 60 mmHg — AB (ref 83.0–108.0)
Patient temperature: 36.9
Patient temperature: 36.8
Patient temperature: 36.9
Patient temperature: 36.9
TCO2: 26 mmol/L (ref 0–100)
TCO2: 26 mmol/L (ref 0–100)
TCO2: 24 mmol/L (ref 0–100)
TCO2: 26 mmol/L (ref 0–100)
TCO2: 25 mmol/L (ref 0–100)
pO2, Arterial: 281 mmHg — ABNORMAL HIGH (ref 83.0–108.0)
pO2, Arterial: 86 mmHg (ref 83.0–108.0)
pO2, Arterial: 66 mmHg — ABNORMAL LOW (ref 83.0–108.0)

## 2016-10-03 LAB — POCT I-STAT, CHEM 8
BUN: 29 mg/dL — ABNORMAL HIGH (ref 6–20)
BUN: 33 mg/dL — ABNORMAL HIGH (ref 6–20)
CHLORIDE: 100 mmol/L — AB (ref 101–111)
CHLORIDE: 101 mmol/L (ref 101–111)
Calcium, Ion: 1.12 mmol/L — ABNORMAL LOW (ref 1.15–1.40)
Calcium, Ion: 1.18 mmol/L (ref 1.15–1.40)
Creatinine, Ser: 1.3 mg/dL — ABNORMAL HIGH (ref 0.61–1.24)
Creatinine, Ser: 1.4 mg/dL — ABNORMAL HIGH (ref 0.61–1.24)
Glucose, Bld: 116 mg/dL — ABNORMAL HIGH (ref 65–99)
Glucose, Bld: 120 mg/dL — ABNORMAL HIGH (ref 65–99)
HEMATOCRIT: 24 % — AB (ref 39.0–52.0)
HEMATOCRIT: 29 % — AB (ref 39.0–52.0)
HEMOGLOBIN: 8.2 g/dL — AB (ref 13.0–17.0)
Hemoglobin: 9.9 g/dL — ABNORMAL LOW (ref 13.0–17.0)
POTASSIUM: 3.7 mmol/L (ref 3.5–5.1)
POTASSIUM: 3.9 mmol/L (ref 3.5–5.1)
SODIUM: 140 mmol/L (ref 135–145)
Sodium: 139 mmol/L (ref 135–145)
TCO2: 22 mmol/L (ref 0–100)
TCO2: 23 mmol/L (ref 0–100)

## 2016-10-03 LAB — PREPARE PLATELET PHERESIS
Unit division: 0
Unit division: 0

## 2016-10-03 LAB — CBC
HEMATOCRIT: 27.3 % — AB (ref 39.0–52.0)
HEMATOCRIT: 29.1 % — AB (ref 39.0–52.0)
HEMATOCRIT: 30.9 % — AB (ref 39.0–52.0)
HEMOGLOBIN: 10.3 g/dL — AB (ref 13.0–17.0)
HEMOGLOBIN: 9.1 g/dL — AB (ref 13.0–17.0)
Hemoglobin: 9.7 g/dL — ABNORMAL LOW (ref 13.0–17.0)
MCH: 26.1 pg (ref 26.0–34.0)
MCH: 26.1 pg (ref 26.0–34.0)
MCH: 26.7 pg (ref 26.0–34.0)
MCHC: 33.3 g/dL (ref 30.0–36.0)
MCHC: 33.3 g/dL (ref 30.0–36.0)
MCHC: 33.3 g/dL (ref 30.0–36.0)
MCV: 78.4 fL (ref 78.0–100.0)
MCV: 78.4 fL (ref 78.0–100.0)
MCV: 80.1 fL (ref 78.0–100.0)
Platelets: 95 10*3/uL — ABNORMAL LOW (ref 150–400)
Platelets: 104 10*3/uL — ABNORMAL LOW (ref 150–400)
Platelets: 87 10*3/uL — ABNORMAL LOW (ref 150–400)
RBC: 3.86 MIL/uL — ABNORMAL LOW (ref 4.22–5.81)
RBC: 3.48 MIL/uL — ABNORMAL LOW (ref 4.22–5.81)
RBC: 3.71 MIL/uL — AB (ref 4.22–5.81)
RDW: 15.3 % (ref 11.5–15.5)
RDW: 15.1 % (ref 11.5–15.5)
RDW: 15.4 % (ref 11.5–15.5)
WBC: 22.1 10*3/uL — ABNORMAL HIGH (ref 4.0–10.5)
WBC: 20.1 10*3/uL — ABNORMAL HIGH (ref 4.0–10.5)
WBC: 17.9 10*3/uL — ABNORMAL HIGH (ref 4.0–10.5)

## 2016-10-03 LAB — CULTURE, BLOOD (ROUTINE X 2)
Culture: NO GROWTH
Culture: NO GROWTH
SPECIAL REQUESTS: ADEQUATE
Special Requests: ADEQUATE

## 2016-10-03 LAB — BASIC METABOLIC PANEL
Anion gap: 7 (ref 5–15)
BUN: 35 mg/dL — AB (ref 6–20)
CHLORIDE: 107 mmol/L (ref 101–111)
CO2: 23 mmol/L (ref 22–32)
Calcium: 8.4 mg/dL — ABNORMAL LOW (ref 8.9–10.3)
Creatinine, Ser: 1.38 mg/dL — ABNORMAL HIGH (ref 0.61–1.24)
GFR calc Af Amer: 59 mL/min — ABNORMAL LOW (ref >60)
GFR calc non Af Amer: 51 mL/min — ABNORMAL LOW (ref >60)
Glucose, Bld: 110 mg/dL — ABNORMAL HIGH (ref 65–99)
POTASSIUM: 4.3 mmol/L (ref 3.5–5.1)
Sodium: 137 mmol/L (ref 135–145)

## 2016-10-03 LAB — POCT I-STAT 4, (NA,K, GLUC, HGB,HCT)
GLUCOSE: 128 mg/dL — AB (ref 65–99)
HEMATOCRIT: 24 % — AB (ref 39.0–52.0)
HEMOGLOBIN: 8.2 g/dL — AB (ref 13.0–17.0)
Potassium: 3.8 mmol/L (ref 3.5–5.1)
Sodium: 140 mmol/L (ref 135–145)

## 2016-10-03 LAB — BPAM RBC
BLOOD PRODUCT EXPIRATION DATE: 201807022359
Blood Product Expiration Date: 201807032359
Blood Product Expiration Date: 201807032359
ISSUE DATE / TIME: 201806220920
ISSUE DATE / TIME: 201806220920
ISSUE DATE / TIME: 201806221828
UNIT TYPE AND RH: 6200
UNIT TYPE AND RH: 6200
Unit Type and Rh: 6200

## 2016-10-03 LAB — BPAM FFP
BLOOD PRODUCT EXPIRATION DATE: 201806262359
BLOOD PRODUCT EXPIRATION DATE: 201806272359
ISSUE DATE / TIME: 201806221352
ISSUE DATE / TIME: 201806221352
UNIT TYPE AND RH: 6200
Unit Type and Rh: 6200

## 2016-10-03 LAB — BPAM PLATELET PHERESIS
Blood Product Expiration Date: 201806232359
Blood Product Expiration Date: 201806232359
ISSUE DATE / TIME: 201806221351
ISSUE DATE / TIME: 201806221351
UNIT TYPE AND RH: 7300
Unit Type and Rh: 6200

## 2016-10-03 LAB — TYPE AND SCREEN
ABO/RH(D): A POS
Antibody Screen: NEGATIVE
UNIT DIVISION: 0
UNIT DIVISION: 0
Unit division: 0

## 2016-10-03 LAB — MAGNESIUM
MAGNESIUM: 2.5 mg/dL — AB (ref 1.7–2.4)
Magnesium: 2.7 mg/dL — ABNORMAL HIGH (ref 1.7–2.4)
Magnesium: 2.2 mg/dL (ref 1.7–2.4)

## 2016-10-03 LAB — CREATININE, SERUM
Creatinine, Ser: 1.41 mg/dL — ABNORMAL HIGH (ref 0.61–1.24)
Creatinine, Ser: 1.49 mg/dL — ABNORMAL HIGH (ref 0.61–1.24)
GFR calc Af Amer: 57 mL/min — ABNORMAL LOW (ref >60)
GFR calc Af Amer: 53 mL/min — ABNORMAL LOW (ref >60)
GFR calc non Af Amer: 49 mL/min — ABNORMAL LOW (ref >60)
GFR calc non Af Amer: 46 mL/min — ABNORMAL LOW (ref >60)

## 2016-10-03 LAB — PREPARE FRESH FROZEN PLASMA
UNIT DIVISION: 0
Unit division: 0

## 2016-10-03 MED ORDER — POTASSIUM CHLORIDE 10 MEQ/50ML IV SOLN
10.0000 meq | INTRAVENOUS | Status: AC | PRN
  Administered 2016-10-03 (×3): 10 meq via INTRAVENOUS
  Filled 2016-10-03: qty 50

## 2016-10-03 MED ORDER — AMIODARONE LOAD VIA INFUSION
150.0000 mg | Freq: Once | INTRAVENOUS | Status: AC
Start: 2016-10-03 — End: 2016-10-03
  Administered 2016-10-03: 150 mg via INTRAVENOUS
  Filled 2016-10-03: qty 83.34

## 2016-10-03 MED ORDER — ALBUMIN HUMAN 5 % IV SOLN
12.5000 g | Freq: Once | INTRAVENOUS | Status: AC
  Administered 2016-10-03: 12.5 g via INTRAVENOUS

## 2016-10-03 MED ORDER — SODIUM CHLORIDE 0.9% FLUSH
10.0000 mL | Freq: Two times a day (BID) | INTRAVENOUS | Status: DC
  Administered 2016-10-03 – 2016-10-04 (×2): 10 mL

## 2016-10-03 MED ORDER — CALCIUM CHLORIDE 10 % IV SOLN
1.0000 g | Freq: Once | INTRAVENOUS | Status: AC
  Administered 2016-10-03: 1 g via INTRAVENOUS

## 2016-10-03 MED ORDER — CHLORHEXIDINE GLUCONATE 0.12% ORAL RINSE (MEDLINE KIT)
15.0000 mL | Freq: Two times a day (BID) | OROMUCOSAL | Status: DC
  Administered 2016-10-03 – 2016-10-05 (×4): 15 mL via OROMUCOSAL

## 2016-10-03 MED ORDER — AMIODARONE HCL IN DEXTROSE 360-4.14 MG/200ML-% IV SOLN
30.0000 mg/h | INTRAVENOUS | Status: DC
  Administered 2016-10-04 (×2): 30 mg/h via INTRAVENOUS
  Filled 2016-10-03 (×2): qty 200

## 2016-10-03 MED ORDER — METOLAZONE 5 MG PO TABS
5.0000 mg | ORAL_TABLET | Freq: Once | ORAL | Status: AC
  Administered 2016-10-03: 5 mg via ORAL
  Filled 2016-10-03: qty 1

## 2016-10-03 MED ORDER — INSULIN DETEMIR 100 UNIT/ML ~~LOC~~ SOLN
14.0000 [IU] | Freq: Two times a day (BID) | SUBCUTANEOUS | Status: DC
  Administered 2016-10-03 – 2016-10-04 (×4): 14 [IU] via SUBCUTANEOUS
  Filled 2016-10-03 (×5): qty 0.14

## 2016-10-03 MED ORDER — MIDAZOLAM HCL 2 MG/2ML IJ SOLN
1.0000 mg | Freq: Four times a day (QID) | INTRAMUSCULAR | Status: DC | PRN
  Administered 2016-10-04: 1 mg via INTRAVENOUS
  Filled 2016-10-03: qty 2

## 2016-10-03 MED ORDER — SODIUM CHLORIDE 0.9% FLUSH: 10.0000 mL | INTRAVENOUS | Status: DC | PRN

## 2016-10-03 MED ORDER — DEXTROSE 5 % IV SOLN
10.0000 mg/h | INTRAVENOUS | Status: DC
  Administered 2016-10-03: 10 mg/h via INTRAVENOUS
  Filled 2016-10-03 (×3): qty 25

## 2016-10-03 MED ORDER — CHLORHEXIDINE GLUCONATE CLOTH 2 % EX PADS
6.0000 | MEDICATED_PAD | Freq: Every day | CUTANEOUS | Status: DC
  Administered 2016-10-03 – 2016-10-05 (×3): 6 via TOPICAL

## 2016-10-03 MED ORDER — INSULIN ASPART 100 UNIT/ML ~~LOC~~ SOLN
0.0000 [IU] | SUBCUTANEOUS | Status: DC
  Administered 2016-10-03 – 2016-10-05 (×8): 2 [IU] via SUBCUTANEOUS

## 2016-10-03 MED ORDER — AMIODARONE HCL IN DEXTROSE 360-4.14 MG/200ML-% IV SOLN
60.0000 mg/h | INTRAVENOUS | Status: AC
  Administered 2016-10-03: 60 mg/h via INTRAVENOUS
  Filled 2016-10-03: qty 400

## 2016-10-03 MED ORDER — ORAL CARE MOUTH RINSE
15.0000 mL | Freq: Four times a day (QID) | OROMUCOSAL | Status: DC
  Administered 2016-10-03 – 2016-10-05 (×8): 15 mL via OROMUCOSAL

## 2016-10-03 MED ORDER — POTASSIUM CHLORIDE 10 MEQ/50ML IV SOLN
10.0000 meq | INTRAVENOUS | Status: AC
  Administered 2016-10-03 (×2): 10 meq via INTRAVENOUS
  Filled 2016-10-03 (×2): qty 50

## 2016-10-03 NOTE — Plan of Care (Signed)
Problem: Coping: Goal: Ability to adjust to condition or change in health will improve Outcome: Progressing Pt is a bit anxious tonight  Problem: Nutritional: Goal: Risk for body nutrition deficit will decrease Outcome: Not Progressing Pt remains NPO for now  Problem: Physical Regulation: Goal: Postoperative complications will be avoided or minimized Outcome: Progressing Good progress on vasoactive gtt weaning today  Problem: Respiratory: Goal: Respiratory status will improve Outcome: Progressing Pt breaths shallow and fast, needs to work on pulmonary toliet

## 2016-10-03 NOTE — Procedures (Signed)
Extubation Procedure Note  Patient Details:   Name: Charles [name] DOB: [birth year] MRN: [id number]   Airway Documentation:     Evaluation  O2 sats: stable throughout Complications: No apparent complications Patient did tolerate procedure well. Bilateral Breath Sounds: Clear, Diminished   Yes  Pt placed on 4lpm [place]   Cordella Register 10/03/2016, 3:27 PM

## 2016-10-03 NOTE — Progress Notes (Signed)
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 Subjective: CXR and oxygenation better Wt up 20lbs Needs diuresis prior to vent wean A-pacing  Objective: Vital signs in last 24 hours: Temp:  [96.3 F (35.7 C)-98.8 F (37.1 C)] 98.8 F (37.1 C) (06/23 0830) Pulse Rate:  [29-90] 80 (06/23 0830) Cardiac Rhythm: Atrial paced (06/23 0830) Resp:  [11-28] 20 (06/23 0830) BP: (71-118)/(49-88) 110/64 (06/23 0830) SpO2:  [81 %-100 %] 100 % (06/23 0830) Arterial Line BP: (57-120)/(42-74) 103/61 (06/23 0830) FiO2 (%):  [70 %-100 %] 70 % (06/23 0820) Weight:  [251 lb 1.7 oz (113.9 kg)] 251 lb 1.7 oz (113.9 kg) (06/23 0435)  Hemodynamic parameters for last 24 hours: PAP: (20-32)/(9-26) 26/16 CO:  [4.1 L/min-5.9 L/min] 4.9 L/min CI:  [1.8 L/min/m2-2.6 L/min/m2] 2.2 L/min/m2  Intake/Output from previous day: 06/22 0701 - 06/23 0700 In: 13322 [I.V.:8104; POEUM:3536; NG/GT:30; IV Piggyback:2550] Out: 8600 [Urine:5725; Blood:2175; Chest Tube:700] Intake/Output this shift: Total I/O In: 70.5 [I.V.:70.5] Out: 460 [Urine:140; Emesis/NG output:250; Chest Tube:70]       Exam    General- alert and comfortable on vent   Lungs- clear without rales, wheezes   Cor- regular rate and rhythm, no murmur , gallop   Abdomen- soft, non-tender   Extremities - warm, non-tender, minimal edema   Neuro- oriented, appropriate, no focal weakness   Lab Results:  Recent Labs  10/02/16 1655  10/03/16 0029 10/03/16 0453  WBC 23.2*  --   --  20.1*  HGB 10.8*  < > 9.9* 9.7*  HCT 33.7*  < >  29.0* 29.1*  PLT 125*  --   --  104*  < > = values in this interval not displayed. BMET:  Recent Labs  10/02/16 0545  10/03/16 0029 10/03/16 0453  NA 137  < > 140 137  K 4.2  < > 3.9 4.3  CL 99*  < > 101 107  CO2 29  --   --  23  GLUCOSE 113*  < > 116* 110*  BUN 55*  < > 33* 35*  CREATININE 1.58*  < > 1.30* 1.38*  CALCIUM 8.9  --   --  8.4*  < > = values in this interval not displayed.  PT/INR:  Recent Labs  10/02/16 1655  LABPROT 20.5*  INR 1.74   ABG    Component Value Date/Time   PHART 7.465 (H) 10/03/2016 0521   HCO3 24.9 10/03/2016 0521   TCO2 26 10/03/2016 0521   ACIDBASEDEF 1.0 10/02/2016 2329   O2SAT 100.0 10/03/2016 0521   CBG (last 3)   Recent Labs  10/03/16 0531 10/03/16 0639 10/03/16 0747  GLUCAP 118* 119* 123*    Assessment/Plan: S/P Procedure(s) (LRB): AORTIC ROOT REPLACEMENT  - using 81mm Allograft (N/A) CORONARY ARTERY BYPASS GRAFTING (CABG) x one, using right leg greater saphenous vein harvested endoscopically (N/A) THORACIC ASCENDING ANEURYSM REPAIR  - using 69mm Allograft;  Reimplantation of Left Coronary Button (N/A) TRANSESOPHAGEAL ECHOCARDIOGRAM (TEE) (N/A) PATENT FORAMEN OVALE (PFO) CLOSURE (N/A) REPAIR 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

## 2016-10-03 NOTE — Progress Notes (Signed)
CTS PM Rounds  extubated Neuro intact afib-- amiodarone started Excellent diuresis with lasix drip

## 2016-10-03 NOTE — Progress Notes (Signed)
NIF -28, VC 1.3L

## 2016-10-04 ENCOUNTER — Inpatient Hospital Stay (HOSPITAL_COMMUNITY): Payer: Medicare Other

## 2016-10-04 LAB — CBC
HCT: 28.3 % — ABNORMAL LOW (ref 39.0–52.0)
Hemoglobin: 9.3 g/dL — ABNORMAL LOW (ref 13.0–17.0)
MCH: 26.5 pg (ref 26.0–34.0)
MCHC: 32.9 g/dL (ref 30.0–36.0)
MCV: 80.6 fL (ref 78.0–100.0)
Platelets: 91 10*3/uL — ABNORMAL LOW (ref 150–400)
RBC: 3.51 MIL/uL — ABNORMAL LOW (ref 4.22–5.81)
RDW: 16.2 % — ABNORMAL HIGH (ref 11.5–15.5)
WBC: 18.6 10*3/uL — ABNORMAL HIGH (ref 4.0–10.5)

## 2016-10-04 LAB — BASIC METABOLIC PANEL
Anion gap: 8 (ref 5–15)
BUN: 30 mg/dL — ABNORMAL HIGH (ref 6–20)
CO2: 25 mmol/L (ref 22–32)
Calcium: 8 mg/dL — ABNORMAL LOW (ref 8.9–10.3)
Chloride: 102 mmol/L (ref 101–111)
Creatinine, Ser: 1.5 mg/dL — ABNORMAL HIGH (ref 0.61–1.24)
GFR calc Af Amer: 53 mL/min — ABNORMAL LOW (ref >60)
GFR calc non Af Amer: 46 mL/min — ABNORMAL LOW (ref >60)
Glucose, Bld: 146 mg/dL — ABNORMAL HIGH (ref 65–99)
Potassium: 3.9 mmol/L (ref 3.5–5.1)
Sodium: 135 mmol/L (ref 135–145)

## 2016-10-04 LAB — POCT I-STAT 3, ART BLOOD GAS (G3+)
ACID-BASE EXCESS: 4 mmol/L — AB (ref 0.0–2.0)
ACID-BASE EXCESS: 1 mmol/L (ref 0.0–2.0)
Acid-Base Excess: 1 mmol/L (ref 0.0–2.0)
BICARBONATE: 24.5 mmol/L (ref 20.0–28.0)
BICARBONATE: 26.4 mmol/L (ref 20.0–28.0)
Bicarbonate: 27.7 mmol/L (ref 20.0–28.0)
O2 SAT: 91 %
O2 Saturation: 92 %
O2 Saturation: 94 %
PCO2 ART: 33.3 mmHg (ref 32.0–48.0)
PH ART: 7.401 (ref 7.350–7.450)
PH ART: 7.474 — AB (ref 7.350–7.450)
PH ART: 7.483 — AB (ref 7.350–7.450)
PO2 ART: 59 mmHg — AB (ref 83.0–108.0)
Patient temperature: 37
TCO2: 25 mmol/L (ref 0–100)
TCO2: 28 mmol/L (ref 0–100)
TCO2: 29 mmol/L (ref 0–100)
pCO2 arterial: 36.9 mmHg (ref 32.0–48.0)
pCO2 arterial: 42.6 mmHg (ref 32.0–48.0)
pO2, Arterial: 56 mmHg — ABNORMAL LOW (ref 83.0–108.0)
pO2, Arterial: 69 mmHg — ABNORMAL LOW (ref 83.0–108.0)

## 2016-10-04 LAB — GLUCOSE, CAPILLARY
GLUCOSE-CAPILLARY: 140 mg/dL — AB (ref 65–99)
GLUCOSE-CAPILLARY: 133 mg/dL — AB (ref 65–99)
Glucose-Capillary: 130 mg/dL — ABNORMAL HIGH (ref 65–99)
Glucose-Capillary: 144 mg/dL — ABNORMAL HIGH (ref 65–99)
Glucose-Capillary: 146 mg/dL — ABNORMAL HIGH (ref 65–99)

## 2016-10-04 LAB — POCT I-STAT, CHEM 8
BUN: 31 mg/dL — ABNORMAL HIGH (ref 6–20)
CHLORIDE: 97 mmol/L — AB (ref 101–111)
CREATININE: 1.7 mg/dL — AB (ref 0.61–1.24)
Calcium, Ion: 1.14 mmol/L — ABNORMAL LOW (ref 1.15–1.40)
GLUCOSE: 154 mg/dL — AB (ref 65–99)
HEMATOCRIT: 28 % — AB (ref 39.0–52.0)
HEMOGLOBIN: 9.5 g/dL — AB (ref 13.0–17.0)
POTASSIUM: 4 mmol/L (ref 3.5–5.1)
Sodium: 136 mmol/L (ref 135–145)
TCO2: 26 mmol/L (ref 0–100)

## 2016-10-04 LAB — COOXEMETRY PANEL
CARBOXYHEMOGLOBIN: 1.5 % (ref 0.5–1.5)
METHEMOGLOBIN: 0.8 % (ref 0.0–1.5)
O2 SAT: 61.4 %
TOTAL HEMOGLOBIN: 9.4 g/dL — AB (ref 12.0–16.0)

## 2016-10-04 MED ORDER — FUROSEMIDE 10 MG/ML IJ SOLN
10.0000 mg/h | INTRAVENOUS | Status: DC
  Administered 2016-10-04 – 2016-10-05 (×2): 15 mg/h via INTRAVENOUS
  Administered 2016-10-05: 10 mg/h via INTRAVENOUS
  Filled 2016-10-04 (×4): qty 25
  Filled 2016-10-04: qty 21

## 2016-10-04 MED ORDER — MAGNESIUM SULFATE 2 GM/50ML IV SOLN
2.0000 g | Freq: Once | INTRAVENOUS | Status: AC
  Administered 2016-10-04: 2 g via INTRAVENOUS
  Filled 2016-10-04: qty 50

## 2016-10-04 MED ORDER — POTASSIUM CHLORIDE 10 MEQ/50ML IV SOLN
10.0000 meq | INTRAVENOUS | Status: AC
  Administered 2016-10-04 (×2): 10 meq via INTRAVENOUS
  Filled 2016-10-04 (×2): qty 50

## 2016-10-04 MED ORDER — NOREPINEPHRINE BITARTRATE 1 MG/ML IV SOLN
0.0000 ug/min | INTRAVENOUS | Status: DC
  Filled 2016-10-04: qty 16

## 2016-10-04 MED ORDER — METOCLOPRAMIDE HCL 5 MG/ML IJ SOLN
10.0000 mg | Freq: Four times a day (QID) | INTRAMUSCULAR | Status: DC
  Administered 2016-10-04 – 2016-10-05 (×4): 10 mg via INTRAVENOUS
  Filled 2016-10-04 (×4): qty 2

## 2016-10-04 MED ORDER — MILRINONE LACTATE IN DEXTROSE 20-5 MG/100ML-% IV SOLN
0.0000 ug/kg/min | INTRAVENOUS | Status: DC
  Administered 2016-10-04: 0.25 ug/kg/min via INTRAVENOUS
  Filled 2016-10-04: qty 100

## 2016-10-04 MED ORDER — POTASSIUM CHLORIDE 10 MEQ/50ML IV SOLN
10.0000 meq | INTRAVENOUS | Status: DC
  Filled 2016-10-04 (×2): qty 50

## 2016-10-04 MED ORDER — METOLAZONE 5 MG PO TABS
5.0000 mg | ORAL_TABLET | Freq: Every day | ORAL | Status: DC
  Administered 2016-10-04: 5 mg via ORAL
  Filled 2016-10-04: qty 1

## 2016-10-04 NOTE — Progress Notes (Signed)
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 Atrially paced at 90/m with suppression of PVCs Norepinephrine weaned off Continue milrinone, renal dopamine and amiodarone  Objective: Vital signs in last 24 hours: Temp:  [97.8 F (36.6 C)-98.8 F (37.1 C)] 97.8 F (36.6 C) (06/24 1100) Pulse Rate:  [37-90] 81 (06/24 1445) Cardiac Rhythm: Ventricular paced (06/24 1200) Resp:  [0-44] 17 (06/24 1445) BP: (79-136)/(51-90) 116/58 (06/24 1445) SpO2:  [90 %-97 %] 94 % (06/24 1445) Arterial Line BP: (99-183)/(46-78) 127/56 (06/24 1445) FiO2 (%):  [45 %-55 %] 55 % (06/24 0832) Weight:  [239 lb 6.7 oz (108.6 kg)] 239 lb 6.7 oz (108.6 kg) (06/24 0530)  Hemodynamic parameters for last 24 hours: PAP: (22-34)/(8-21) 34/21 CO:  [6.8 L/min-8.3 L/min] 6.9 L/min CI:  [3 L/min/m2-3.7 L/min/m2] 3.1 L/min/m2  Intake/Output from previous day: 06/23 0701 - 06/24 0700 In: 3174.7 [P.O.:340; I.V.:2114.7; NG/GT:120; IV Piggyback:600] Out: 7200 [Urine:5980; Emesis/NG output:350; Chest Tube:870] Intake/Output this shift: Total I/O In: 3 [I.V.:3] Out: 1375 [Urine:1125; Chest Tube:250]  Scattered rhonchi Neuro intact Extremities edematous but warm Abdomen soft  Lab Results:  Recent Labs  10/03/16 1550  10/04/16 0407 10/04/16 1601  WBC 17.9*  --  18.6*  --   HGB 9.1*  < > 9.3* 9.5*  HCT 27.3*  < > 28.3* 28.0*  PLT 87*  --  91*  --   < > = values in this interval not displayed. BMET:  Recent  Labs  10/03/16 0453  10/04/16 0407 10/04/16 1601  NA 137  < > 135 136  K 4.3  < > 3.9 4.0  CL 107  < > 102 97*  CO2 23  --  25  --   GLUCOSE 110*  < > 146* 154*  BUN 35*  < > 30* 31*  CREATININE 1.38*  < > 1.50* 1.70*  CALCIUM 8.4*  --  8.0*  --   < > = values in this interval not displayed.  PT/INR:  Recent Labs  10/02/16 1655  LABPROT 20.5*  INR 1.74   ABG    Component Value Date/Time   PHART 7.401 10/04/2016 1608   HCO3 26.4 10/04/2016 1608   TCO2 28 10/04/2016 1608   ACIDBASEDEF 1.0 10/02/2016 2329   O2SAT 94.0 10/04/2016 1608   CBG (last 3)   Recent Labs  10/04/16 0344 10/04/16 0824 10/04/16 1132  GLUCAP 140* 133* 130*    Assessment/Plan: S/P Procedure(s) (LRB): AORTIC ROOT REPLACEMENT  - using 25mm Allograft (N/A) CORONARY ARTERY BYPASS GRAFTING (CABG) x one, using right leg greater saphenous vein harvested endoscopically (N/A) THORACIC ASCENDING ANEURYSM REPAIR  - using 49mm Allograft;  Reimplantation of Left Coronary Button (N/A) TRANSESOPHAGEAL ECHOCARDIOGRAM (TEE) (N/A) PATENT FORAMEN OVALE (PFO) CLOSURE (N/A) REPAIR 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

## 2016-10-04 NOTE — Progress Notes (Signed)
Pharmacy Antibiotic Note Charles [name] is a 71 y.o. 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 Currently on ceftriaxone and vancomycin. SCr with slight trend up to 1.5, CrCl 59 ml/min. Vancomycin dose adjusted on 6/21 to 1g IV q24.  Vancomycin 15-20  Plan: 1) Continue vancomycin 1g IV q24 - check another trough early next week 2) Continue ceftriaxone 2g IV q24    Height: 6' (182.9 cm) Weight: 239 lb 6.7 oz (108.6 kg) IBW/kg (Calculated) : 77.6  Temp (24hrs), Avg:98.3 F (36.8 C), Min:98.1 F (36.7 C), Max:98.8 F (37.1 C)   Recent Labs Lab 10/01/16 0849  10/02/16 1528 10/02/16 1655 10/03/16 0029 10/03/16 0453 10/03/16 1550 10/03/16 1602 10/04/16 0407  WBC  --   < > 21.9* 23.2*  --  20.1* 17.9*  --  18.6*  CREATININE 1.40*  < >  --   --  1.30* 1.38* 1.49* 1.40* 1.50*  VANCOTROUGH 28*  --   --   --   --   --   --   --   --   < > = values in this interval not displayed.  Estimated Creatinine Clearance: 59.2 mL/min (A) (by C-G formula based on SCr of 1.5 mg/dL (H)).    Allergies  Allergen Reactions  . No Known Allergies     Antimicrobials this admission: Unasyn 6/15-6/18  Ceftriaxone 6/18 >>   Vancomycin 6/18 >>   Dose adjustments this admission: 6/21: VT 28 on 750 mg q12H and SCr 1.4 > adjusted to 1 gram q24h  Microbiology results: 6/14 Blood x 2: ngF 6/18 Blood x 2: ngtd 6/19 MRSA PCR: negative  6/19 UCx: ngF  Charles [name], PharmD, BCPS 10/04/2016, 11:02 AM

## 2016-10-04 NOTE — Plan of Care (Signed)
Problem: Bowel/Gastric: Goal: Gastrointestinal status for postoperative course will improve Outcome: Progressing Good bowel sounds today, able to tolerate PO intake  Problem: Cardiac: Goal: Hemodynamic stability will improve Outcome: Progressing gtts weaned today, Swan removed

## 2016-10-04 NOTE — Progress Notes (Signed)
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 Subjective: nsr with PJC's, O2 sats borderline- cont diuresis and IS, flutter v Cardiac out[put 8.0, PA pressures normal - DC swan , OOB to chair, PT consult Start po full liq diet Objective: Vital signs in last 24 hours: Temp:  [98.1 F (36.7 C)-98.8 F (37.1 C)] 98.6 F (37 C) (06/24 0700) Pulse Rate:  [32-96] 86 (06/24 0700) Cardiac Rhythm: Ventricular paced (06/24 0832) Resp:  [0-36] 36 (06/24 0700) BP: (77-103)/(46-68) 97/63 (06/23 2100) SpO2:  [90 %-100 %] 95 % (06/24 0700) Arterial Line BP: (86-305)/(42-296) 117/63 (06/24 0700) FiO2 (%):  [40 %-55 %] 55 % (06/24 0832) Weight:  [239 lb 6.7 oz (108.6 kg)] 239 lb 6.7 oz (108.6 kg) (06/24 0530)  Hemodynamic parameters for last 24 hours: PAP: (15-34)/(0-21) 34/21 CO:  [5.7 L/min-8.3 L/min] 6.9 L/min CI:  [2.5 L/min/m2-3.7 L/min/m2] 3.1 L/min/m2  Intake/Output from previous day: 06/23 0701 - 06/24 0700 In: 3174.7 [P.O.:340; I.V.:2114.7; NG/GT:120; IV Piggyback:600] Out: 7200 [Urine:5980; Emesis/NG output:350; Chest Tube:870] Intake/Output this shift: Total I/O In: 3 [I.V.:3] Out: 825 [Urine:625; Chest Tube:200]       Exam    General- alert and comfortable   Lungs- clear without rales, wheezes   Cor- regular rate and rhythm, no murmur , gallop   Abdomen- soft, non-tender   Extremities - warm, non-tender, minimal edema   Neuro- oriented, appropriate, no focal weakness   Lab Results:  Recent Labs  10/03/16 1550  10/03/16 1750 10/04/16 0407  WBC 17.9*   --   --  18.6*  HGB 9.1*  < > 8.2* 9.3*  HCT 27.3*  < > 24.0* 28.3*  PLT 87*  --   --  91*  < > = values in this interval not displayed. BMET:  Recent Labs  10/03/16 0453  10/03/16 1602 10/03/16 1750 10/04/16 0407  NA 137  --  139 140 135  K 4.3  --  3.7 3.8 3.9  CL 107  --  100*  --  102  CO2 23  --   --   --  25  GLUCOSE 110*  --  120* 128* 146*  BUN 35*  --  29*  --  30*  CREATININE 1.38*  < > 1.40*  --  1.50*  CALCIUM 8.4*  --   --   --  8.0*  < > = values in this interval not displayed.  PT/INR:  Recent Labs  10/02/16 1655  LABPROT 20.5*  INR 1.74   ABG    Component Value Date/Time   PHART 7.483 (H) 10/04/2016 0524   HCO3 27.7 10/04/2016 0524   TCO2 29 10/04/2016 0524   ACIDBASEDEF 1.0 10/02/2016 2329   O2SAT 91.0 10/04/2016 0524   CBG (last 3)   Recent Labs  10/04/16 0008 10/04/16 0344 10/04/16 0824  GLUCAP 144* 140* 133*    Assessment/Plan: S/P Procedure(s) (LRB): AORTIC ROOT REPLACEMENT  - using 22mm Allograft (N/A) CORONARY ARTERY BYPASS GRAFTING (CABG) x one, using right leg greater saphenous vein harvested endoscopically (N/A) THORACIC ASCENDING ANEURYSM REPAIR  - using 6mm Allograft;  Reimplantation of Left Coronary Button (N/A) TRANSESOPHAGEAL ECHOCARDIOGRAM (  TEE) (N/A) PATENT FORAMEN OVALE (PFO) CLOSURE (N/A) REPAIR FALSE 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

## 2016-10-05 ENCOUNTER — Encounter (HOSPITAL_COMMUNITY): Payer: Self-pay | Admitting: Thoracic Surgery (Cardiothoracic Vascular Surgery)

## 2016-10-05 ENCOUNTER — Inpatient Hospital Stay (HOSPITAL_COMMUNITY): Payer: Medicare Other

## 2016-10-05 LAB — BASIC METABOLIC PANEL
Anion gap: 11 (ref 5–15)
BUN: 33 mg/dL — ABNORMAL HIGH (ref 6–20)
CO2: 29 mmol/L (ref 22–32)
Calcium: 8.2 mg/dL — ABNORMAL LOW (ref 8.9–10.3)
Chloride: 97 mmol/L — ABNORMAL LOW (ref 101–111)
Creatinine, Ser: 1.7 mg/dL — ABNORMAL HIGH (ref 0.61–1.24)
GFR calc Af Amer: 46 mL/min — ABNORMAL LOW (ref >60)
GFR calc non Af Amer: 39 mL/min — ABNORMAL LOW (ref >60)
Glucose, Bld: 103 mg/dL — ABNORMAL HIGH (ref 65–99)
Potassium: 3 mmol/L — ABNORMAL LOW (ref 3.5–5.1)
Sodium: 137 mmol/L (ref 135–145)

## 2016-10-05 LAB — POCT I-STAT 4, (NA,K, GLUC, HGB,HCT)
Glucose, Bld: 95 mg/dL (ref 65–99)
HCT: 28 % — ABNORMAL LOW (ref 39.0–52.0)
HEMOGLOBIN: 9.5 g/dL — AB (ref 13.0–17.0)
Potassium: 2.8 mmol/L — ABNORMAL LOW (ref 3.5–5.1)
Sodium: 138 mmol/L (ref 135–145)

## 2016-10-05 LAB — POCT I-STAT 3, ART BLOOD GAS (G3+)
Acid-Base Excess: 8 mmol/L — ABNORMAL HIGH (ref 0.0–2.0)
BICARBONATE: 32.6 mmol/L — AB (ref 20.0–28.0)
O2 Saturation: 94 %
PCO2 ART: 46.2 mmHg (ref 32.0–48.0)
Patient temperature: 97.9
TCO2: 34 mmol/L (ref 0–100)
pH, Arterial: 7.455 — ABNORMAL HIGH (ref 7.350–7.450)
pO2, Arterial: 66 mmHg — ABNORMAL LOW (ref 83.0–108.0)

## 2016-10-05 LAB — GLUCOSE, CAPILLARY
GLUCOSE-CAPILLARY: 97 mg/dL (ref 65–99)
GLUCOSE-CAPILLARY: 147 mg/dL — AB (ref 65–99)
Glucose-Capillary: 110 mg/dL — ABNORMAL HIGH (ref 65–99)
Glucose-Capillary: 120 mg/dL — ABNORMAL HIGH (ref 65–99)
Glucose-Capillary: 133 mg/dL — ABNORMAL HIGH (ref 65–99)
Glucose-Capillary: 90 mg/dL (ref 65–99)
Glucose-Capillary: 98 mg/dL (ref 65–99)

## 2016-10-05 LAB — CBC
HCT: 29.9 % — ABNORMAL LOW (ref 39.0–52.0)
Hemoglobin: 9.6 g/dL — ABNORMAL LOW (ref 13.0–17.0)
MCH: 25.9 pg — ABNORMAL LOW (ref 26.0–34.0)
MCHC: 32.1 g/dL (ref 30.0–36.0)
MCV: 80.6 fL (ref 78.0–100.0)
Platelets: 94 10*3/uL — ABNORMAL LOW (ref 150–400)
RBC: 3.71 MIL/uL — ABNORMAL LOW (ref 4.22–5.81)
RDW: 15.9 % — ABNORMAL HIGH (ref 11.5–15.5)
WBC: 13.5 10*3/uL — ABNORMAL HIGH (ref 4.0–10.5)

## 2016-10-05 LAB — COOXEMETRY PANEL
Carboxyhemoglobin: 1.3 % (ref 0.5–1.5)
Methemoglobin: 0.8 % (ref 0.0–1.5)
O2 SAT: 61.3 %
Total hemoglobin: 10 g/dL — ABNORMAL LOW (ref 12.0–16.0)

## 2016-10-05 MED ORDER — ENOXAPARIN SODIUM 30 MG/0.3ML ~~LOC~~ SOLN
30.0000 mg | SUBCUTANEOUS | Status: DC
  Administered 2016-10-05 – 2016-10-14 (×9): 30 mg via SUBCUTANEOUS
  Filled 2016-10-05 (×10): qty 0.3

## 2016-10-05 MED ORDER — ORAL CARE MOUTH RINSE
15.0000 mL | Freq: Two times a day (BID) | OROMUCOSAL | Status: DC
  Administered 2016-10-05 – 2016-10-11 (×8): 15 mL via OROMUCOSAL

## 2016-10-05 MED ORDER — POTASSIUM CHLORIDE 10 MEQ/50ML IV SOLN
10.0000 meq | INTRAVENOUS | Status: AC
  Administered 2016-10-05 (×3): 10 meq via INTRAVENOUS
  Filled 2016-10-05 (×3): qty 50

## 2016-10-05 MED ORDER — SODIUM CHLORIDE 0.9% FLUSH
10.0000 mL | Freq: Two times a day (BID) | INTRAVENOUS | Status: DC
  Administered 2016-10-05 – 2016-10-07 (×3): 10 mL

## 2016-10-05 MED ORDER — SODIUM CHLORIDE 0.9% FLUSH: 10.0000 mL | Freq: Two times a day (BID) | INTRAVENOUS | Status: DC

## 2016-10-05 MED ORDER — POTASSIUM CHLORIDE 10 MEQ/50ML IV SOLN
10.0000 meq | INTRAVENOUS | Status: AC
  Administered 2016-10-05 – 2016-10-06 (×3): 10 meq via INTRAVENOUS
  Filled 2016-10-05 (×3): qty 50

## 2016-10-05 MED ORDER — SODIUM CHLORIDE 0.9% FLUSH: 10.0000 mL | INTRAVENOUS | Status: DC | PRN

## 2016-10-05 MED ORDER — CHLORHEXIDINE GLUCONATE CLOTH 2 % EX PADS
6.0000 | MEDICATED_PAD | Freq: Every day | CUTANEOUS | Status: DC
  Administered 2016-10-06 – 2016-10-12 (×7): 6 via TOPICAL

## 2016-10-05 MED FILL — Heparin Sodium (Porcine) Inj 1000 Unit/ML: INTRAMUSCULAR | Qty: 20 | Status: AC

## 2016-10-05 MED FILL — Sodium Bicarbonate IV Soln 8.4%: INTRAVENOUS | Qty: 50 | Status: AC

## 2016-10-05 MED FILL — Electrolyte-R (PH 7.4) Solution: INTRAVENOUS | Qty: 4000 | Status: AC

## 2016-10-05 MED FILL — Calcium Chloride Inj 10%: INTRAVENOUS | Qty: 10 | Status: AC

## 2016-10-05 MED FILL — Heparin Sodium (Porcine) Inj 1000 Unit/ML: INTRAMUSCULAR | Qty: 60 | Status: AC

## 2016-10-05 MED FILL — Mannitol IV Soln 20%: INTRAVENOUS | Qty: 500 | Status: AC

## 2016-10-05 MED FILL — Electrolyte-R (PH 7.4) Solution: INTRAVENOUS | Qty: 5000 | Status: AC

## 2016-10-05 MED FILL — Lidocaine HCl IV Inj 20 MG/ML: INTRAVENOUS | Qty: 25 | Status: AC

## 2016-10-05 MED FILL — Sodium Chloride IV Soln 0.9%: INTRAVENOUS | Qty: 6000 | Status: AC

## 2016-10-05 NOTE — Evaluation (Signed)
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  Patient underwent aortic root replacement, CABG x 2 and repair of PFO on 10/02/16.  Clinical Impression  Patient presents with decreased independence with mobility due to deficits listed in PT problem list.  He will benefit from skilled PT in the acute setting to allow return home with family support following CIR level rehab stay.  Previously was independent, currently needing max A for transfers and mod A for bed to chair.      Follow Up Recommendations CIR    Equipment Recommendations  Rolling walker with 5" wheels    Recommendations for Other Services Rehab consult     Precautions / Restrictions Precautions Precautions: Fall;Sternal      Mobility  Bed Mobility Overal bed mobility: Needs Assistance Bed Mobility: Sit to Supine       Sit to supine: Max assist;+2 for physical assistance   General bed mobility comments: assist to side first for shoulders and legs, then for rolling and repositioning  Transfers Overall transfer level: Needs assistance Equipment used: Rolling walker (2 wheeled) Transfers: Sit to/from [facility] Sit to Stand: Max assist Stand pivot transfers: Mod assist       General transfer comment: assisted to lift with pad under pt in chair and cues for sternal precautions; several steps and max cues from chair to bed with assist for lines and walker management  Ambulation/Gait                Stairs            Wheelchair Mobility    Modified Rankin (Stroke Patients Only)       Balance Overall balance assessment: Needs assistance Sitting-balance support: Feet supported Sitting  balance-Leahy Scale: Fair     Standing balance support: Bilateral upper extremity supported Standing balance-Leahy Scale: Poor Standing balance comment: UE support needed for balance                             Pertinent Vitals/Pain Pain Assessment: Faces Faces Pain Scale: Hurts even more Pain Location: generalized Pain Descriptors / Indicators: Discomfort;Sore Pain Intervention(s): Monitored during session;Limited activity within patient's tolerance;Repositioned    Home Living Family/patient expects to be discharged to:: Inpatient rehab Living Arrangements: Spouse/significant other Available Help at Discharge: Family Type of Home: House Home Access: Level entry   Entrance Stairs-Number of Steps: inclined Home Layout: One level Home Equipment: Shower seat - built in      Prior Function Level of Independence: Independent               Hand Dominance        Extremity/Trunk Assessment   Upper Extremity Assessment Upper Extremity Assessment: RUE deficits/detail;LUE deficits/detail RUE Deficits / Details: edema in UE's noted and tends to not use hands, able to lift to shoulder level LUE Deficits / Details: edema in UE's noted and tends to not use hands, able to lift to shoulder level    Lower Extremity Assessment Lower Extremity Assessment: RLE deficits/detail;LLE deficits/detail RLE Deficits / Details: AAROM WFL, strength hip flexion 2/5, knee extension 4/5, ankle DF 4/5 LLE Deficits / Details: AAROM WFL, strength hip flexion 2/5, knee extension 4/5, ankle DF 4/5  Cervical / Trunk Assessment Cervical / Trunk Assessment: Kyphotic  Communication   Communication: No difficulties  Cognition Arousal/Alertness: Awake/alert Behavior During Therapy: WFL for tasks assessed/performed Overall Cognitive Status: Within Functional Limits for tasks assessed                                        General Comments      Exercises General  Exercises - Lower Extremity Hip Flexion/Marching: AAROM;Both;5 reps;Seated Other Exercises Other Exercises: cervical AROM flex/ext and rotation x 5 each seated EOB Other Exercises: thoracic extension x 5 seated EOB with facilitation and cues   Assessment/Plan    PT Assessment Patient needs continued PT services  PT Problem List Decreased strength;Decreased mobility;Decreased safety awareness;Decreased knowledge of precautions;Decreased range of motion;Decreased activity tolerance;Cardiopulmonary status limiting activity;Decreased balance;Decreased knowledge of use of DME       PT Treatment Interventions DME instruction;Gait training;Functional mobility training;Balance training;Therapeutic exercise;Patient/family education;Therapeutic activities    PT Goals (Current goals can be found in the Care Plan section)  Acute Rehab PT Goals Patient Stated Goal: To return to independent PT Goal Formulation: With patient Time For Goal Achievement: 10/12/16 Potential to Achieve Goals: Good    Frequency Min 3X/week   Barriers to discharge Decreased caregiver support states wife cannot physically help him    Co-evaluation               AM-PAC PT "6 Clicks" Daily Activity  Outcome Measure Difficulty turning over in bed (including adjusting bedclothes, sheets and blankets)?: Total Difficulty moving from lying on back to sitting on the side of the bed? : Total Difficulty sitting down on and standing up from a chair with arms (e.g., wheelchair, bedside commode, etc,.)?: Total Help needed moving to and from a bed to chair (including a wheelchair)?: A Lot Help needed walking in hospital room?: A Lot Help needed climbing 3-5 steps with a railing? : Total 6 Click Score: 8    End of Session Equipment Utilized During Treatment: Gait belt;Oxygen Activity Tolerance: Patient limited by fatigue Patient left: in bed;with call bell/phone within reach Nurse Communication: Mobility status PT Visit  Diagnosis: Muscle weakness (generalized) (M62.81);Difficulty in walking, not elsewhere classified (R26.2)    Time: 5361-4431 PT Time Calculation (min) (ACUTE ONLY): 38 min   Charges:   PT Evaluation $PT Eval Moderate Complexity: 1 Procedure PT Treatments $Therapeutic Exercise: 8-22 mins $Therapeutic Activity: 8-22 mins   PT G CodesMagda Kiel, [place] [phone] 10/05/2016   Reginia Naas 10/05/2016, 10:53 AM

## 2016-10-05 NOTE — Progress Notes (Signed)
Peripherally Inserted Central Catheter/Midline Placement  The IV Nurse has discussed with the patient and/or persons authorized to consent for the patient, the purpose of this procedure and the potential benefits and risks involved with this procedure.  The benefits include less needle sticks, lab draws from the catheter, and the patient may be discharged home with the catheter. Risks include, but not limited to, infection, bleeding, blood clot (thrombus formation), and puncture of an artery; nerve damage and irregular heartbeat and possibility to perform a PICC exchange if needed/ordered by physician.  Alternatives to this procedure were also discussed.  Bard Power PICC patient education guide, fact sheet on infection prevention and patient information card has been provided to patient /or left at bedside.    PICC/Midline Placement Documentation        Charles [name], Charles [name] 10/05/2016, 4:06 PM

## 2016-10-05 NOTE — Progress Notes (Addendum)
Rehab Admissions Coordinator Note:  Patient was screened by Cleatrice Burke for appropriateness for an Inpatient Acute Rehab Consult per PT recommendation.   At this time, we are recommending Inpatient Rehab consult. Please place an order for consult if you would like pt considered for an admit. Also recommend OT eval.  Cleatrice Burke 10/05/2016, 11:07 AM  I can be reached at [phone].

## 2016-10-05 NOTE — Progress Notes (Signed)
Patient ID: Charles [name], male   DOB: 1946-03-27, 71 y.o.   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 240   I.V. (mL/kg) 385.3 (3.7)   IV Piggyback 200   Total Intake(mL/kg) 825.3 (7.9)   Urine (mL/kg/hr) 1900 (1.4)   Chest Tube 50 (0)   Total Output 1950   Net -1124.7         . cefTRIAXone (ROCEPHIN)  IV Stopped (10/04/16 2100)  . DOPamine 0.978 mcg/kg/min (10/05/16 1600)  . furosemide (LASIX) infusion 10 mg/hr (10/05/16 1600)  . lactated ringers 10 mL/hr at 10/05/16 1600  . milrinone Stopped (10/05/16 1000)  . vancomycin Stopped (10/05/16 0100)     Lab Results  Component Value Date   WBC 13.5 (H) 10/05/2016   HGB 9.6 (L) 10/05/2016   HCT 29.9 (L) 10/05/2016   PLT 94 (L) 10/05/2016   GLUCOSE 103 (H) 10/05/2016   CHOL 122 07/03/2016   TRIG 125 07/03/2016   HDL 16 (L) 07/03/2016   LDLCALC 81 07/03/2016   ALT 13 (L) 09/29/2016   AST 18 09/29/2016   NA 137 10/05/2016   K 3.0 (L) 10/05/2016   CL 97 (L) 10/05/2016   CREATININE 1.70 (H) 10/05/2016   BUN 33 (H) 10/05/2016   CO2 29 10/05/2016   TSH 1.300 07/01/2016   INR 1.74 10/02/2016   HGBA1C 5.4 10/01/2016    pic line in place now Pleural tubes in  D/c right ij stable  Grace Isaac MD  Beeper [phone] Office [phone] 10/05/2016 7:46 PM

## 2016-10-05 NOTE — Progress Notes (Addendum)
[address]       [place] [address]             [phone]        CARDIOTHORACIC SURGERY PROGRESS NOTE   R3 Days Post-Op Procedure(s) (LRB): AORTIC ROOT REPLACEMENT  - using 1mm Allograft (N/A) CORONARY ARTERY BYPASS GRAFTING (CABG) x one, using right leg greater saphenous vein harvested endoscopically (N/A) THORACIC ASCENDING ANEURYSM REPAIR  - using 4mm Allograft;  Reimplantation of Left Coronary Button (N/A) TRANSESOPHAGEAL ECHOCARDIOGRAM (TEE) (N/A) PATENT FORAMEN OVALE (PFO) CLOSURE (N/A) REPAIR FALSE ANEURYSM of Aortic Root Resection and Grafting of Aortic Root  Subjective: Looks good.  Feels sore in chest around chest tubes  Objective: Vital signs: BP Readings from Last 1 Encounters:  10/05/16 (!) 88/63   Pulse Readings from Last 1 Encounters:  10/05/16 70   Resp Readings from Last 1 Encounters:  10/05/16 (!) 31   Temp Readings from Last 1 Encounters:  10/05/16 97.9 F (36.6 C) (Oral)    Hemodynamics:   Mixed venous co-ox 61%  Physical Exam:  Rhythm:   sinus  Breath sounds: Diminished at bases  Heart sounds:  RRR w/out murmur  Incisions:  Dressings dry, intact  Abdomen:  Soft, non-distended, non-tender  Extremities:  Warm, well-perfused  Chest tubes:  Significant but decreasing volume thin serosanguinous output, no air leak    Intake/Output from previous day: 06/24 0701 - 06/25 0700 In: 2031.4 [P.O.:360; I.V.:1321.4; IV Piggyback:350] Out: 5505 [Urine:4525; Chest Tube:980] Intake/Output this shift: No intake/output data recorded.  Lab Results:  CBC: Recent Labs  10/04/16 0407 10/04/16 1601 10/05/16 0407  WBC 18.6*  --  13.5*  HGB 9.3* 9.5* 9.6*  HCT 28.3* 28.0* 29.9*  PLT 91*  --  94*    BMET:  Recent Labs  10/04/16 0407 10/04/16 1601 10/05/16 0407  NA 135 136 137  K 3.9 4.0 3.0*  CL 102 97* 97*  CO2 25  --  29  GLUCOSE 146* 154* 103*  BUN 30* 31* 33*  CREATININE 1.50* 1.70* 1.70*  CALCIUM 8.0*  --   8.2*     PT/INR:   Recent Labs  10/02/16 1655  LABPROT 20.5*  INR 1.74    CBG (last 3)   Recent Labs  10/04/16 2050 10/04/16 2357 10/05/16 0353  GLUCAP 146* 133* 98    ABG    Component Value Date/Time   PHART 7.455 (H) 10/05/2016 0424   PCO2ART 46.2 10/05/2016 0424   PO2ART 66.0 (L) 10/05/2016 0424   HCO3 32.6 (H) 10/05/2016 0424   TCO2 34 10/05/2016 0424   ACIDBASEDEF 1.0 10/02/2016 2329   O2SAT 61.3 10/05/2016 0425    CXR: PORTABLE CHEST 1 VIEW  COMPARISON:  10/04/2016  FINDINGS: Cardiac shadow is mildly enlarged. Postsurgical changes are again seen. Mediastinal drain and bilateral thoracostomy catheters are noted. No pneumothorax is seen. Swan-Ganz catheter has been removed although the right jugular sheath remains. Bibasilar atelectatic changes are again noted and stable. The previously seen vascular congestion and pulmonary edema has resolved in the interval. No acute bony abnormality is noted.  IMPRESSION: Postsurgical changes with tubes and lines as described.  Bibasilar atelectatic changes stable from the prior exam.   Electronically Signed   By: Inez Catalina M.D.   On: 10/05/2016 07:37  Assessment/Plan: S/P Procedure(s) (LRB): AORTIC ROOT REPLACEMENT  - using 71mm Allograft (N/A) CORONARY ARTERY BYPASS GRAFTING (CABG) x one, using right leg greater saphenous vein harvested endoscopically (N/A) THORACIC ASCENDING  ANEURYSM REPAIR  - using 34mm Allograft;  Reimplantation of Left Coronary Button (N/A) TRANSESOPHAGEAL ECHOCARDIOGRAM (TEE) (N/A) PATENT FORAMEN OVALE (PFO) CLOSURE (N/A) REPAIR FALSE ANEURYSM of Aortic Root Resection and Grafting of Aortic Root  Overall doing fairly well POD3 Maintaining NSR w/ stable hemodynamics on low dose milrinone and dopamine  Breathing comfortably w/ O2 sats 94-97% on HFNC, oxygenation improving Acute diastolic CHF with expected post-op volume excess, diuresing well on lasix drip CKD stage III,  creatinine slightly above preop baseline, likely due to prerenal azotemia and aggressive diuresis Expected post op acute blood loss anemia, mild, stable Expected post op atelectasis, mild Hypokalemia, induced by loop diuretics 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

## 2016-10-05 NOTE — Care Management (Signed)
CM requested CIR consult via physician sticky tab  CM consulted CSW for back up plan for CIR

## 2016-10-06 ENCOUNTER — Inpatient Hospital Stay (HOSPITAL_COMMUNITY): Payer: Medicare Other

## 2016-10-06 DIAGNOSIS — I472 Ventricular tachycardia: Secondary | ICD-10-CM

## 2016-10-06 DIAGNOSIS — I340 Nonrheumatic mitral (valve) insufficiency: Secondary | ICD-10-CM

## 2016-10-06 LAB — TROPONIN I
Troponin I: 4.36 ng/mL (ref <0.03)
Troponin I: 2.89 ng/mL (ref <0.03)
Troponin I: 3.66 ng/mL (ref <0.03)

## 2016-10-06 LAB — MAGNESIUM
Magnesium: 2.3 mg/dL (ref 1.7–2.4)
Magnesium: 2.3 mg/dL (ref 1.7–2.4)

## 2016-10-06 LAB — BASIC METABOLIC PANEL
Anion gap: 11 (ref 5–15)
BUN: 40 mg/dL — ABNORMAL HIGH (ref 6–20)
CALCIUM: 8 mg/dL — AB (ref 8.9–10.3)
CO2: 29 mmol/L (ref 22–32)
CREATININE: 1.97 mg/dL — AB (ref 0.61–1.24)
Chloride: 94 mmol/L — ABNORMAL LOW (ref 101–111)
GFR calc Af Amer: 38 mL/min — ABNORMAL LOW (ref >60)
GFR, EST NON AFRICAN AMERICAN: 33 mL/min — AB (ref >60)
GLUCOSE: 175 mg/dL — AB (ref 65–99)
Potassium: 5.3 mmol/L — ABNORMAL HIGH (ref 3.5–5.1)
Sodium: 134 mmol/L — ABNORMAL LOW (ref 135–145)

## 2016-10-06 LAB — COMPREHENSIVE METABOLIC PANEL
ALBUMIN: 2.5 g/dL — AB (ref 3.5–5.0)
ALK PHOS: 67 U/L (ref 38–126)
ALT: 15 U/L — AB (ref 17–63)
AST: 16 U/L (ref 15–41)
Anion gap: 10 (ref 5–15)
BILIRUBIN TOTAL: 0.6 mg/dL (ref 0.3–1.2)
BUN: 40 mg/dL — AB (ref 6–20)
CALCIUM: 8.1 mg/dL — AB (ref 8.9–10.3)
CO2: 32 mmol/L (ref 22–32)
CREATININE: 2.13 mg/dL — AB (ref 0.61–1.24)
Chloride: 94 mmol/L — ABNORMAL LOW (ref 101–111)
GFR calc Af Amer: 35 mL/min — ABNORMAL LOW (ref >60)
GFR, EST NON AFRICAN AMERICAN: 30 mL/min — AB (ref >60)
Glucose, Bld: 105 mg/dL — ABNORMAL HIGH (ref 65–99)
Potassium: 3.2 mmol/L — ABNORMAL LOW (ref 3.5–5.1)
Sodium: 136 mmol/L (ref 135–145)
TOTAL PROTEIN: 5.3 g/dL — AB (ref 6.5–8.1)

## 2016-10-06 LAB — POCT I-STAT 4, (NA,K, GLUC, HGB,HCT)
GLUCOSE: 185 mg/dL — AB (ref 65–99)
HCT: 27 % — ABNORMAL LOW (ref 39.0–52.0)
HEMOGLOBIN: 9.2 g/dL — AB (ref 13.0–17.0)
Potassium: 3.5 mmol/L (ref 3.5–5.1)
SODIUM: 136 mmol/L (ref 135–145)

## 2016-10-06 LAB — COOXEMETRY PANEL
CARBOXYHEMOGLOBIN: 1 % (ref 0.5–1.5)
Methemoglobin: 1.2 % (ref 0.0–1.5)
O2 Saturation: 64 %
Total hemoglobin: 8.9 g/dL — ABNORMAL LOW (ref 12.0–16.0)

## 2016-10-06 LAB — ECHOCARDIOGRAM COMPLETE
Height: 72 in
Weight: 3587.33 oz

## 2016-10-06 LAB — POCT I-STAT 3, ART BLOOD GAS (G3+)
ACID-BASE EXCESS: 7 mmol/L — AB (ref 0.0–2.0)
BICARBONATE: 31.4 mmol/L — AB (ref 20.0–28.0)
O2 SAT: 92 %
TCO2: 33 mmol/L (ref 0–100)
pCO2 arterial: 41.6 mmHg (ref 32.0–48.0)
pH, Arterial: 7.484 — ABNORMAL HIGH (ref 7.350–7.450)
pO2, Arterial: 58 mmHg — ABNORMAL LOW (ref 83.0–108.0)

## 2016-10-06 LAB — CBC
HCT: 30.2 % — ABNORMAL LOW (ref 39.0–52.0)
Hemoglobin: 9.6 g/dL — ABNORMAL LOW (ref 13.0–17.0)
MCH: 26.1 pg (ref 26.0–34.0)
MCHC: 31.8 g/dL (ref 30.0–36.0)
MCV: 82.1 fL (ref 78.0–100.0)
PLATELETS: 114 10*3/uL — AB (ref 150–400)
RBC: 3.68 MIL/uL — ABNORMAL LOW (ref 4.22–5.81)
RDW: 16.1 % — AB (ref 11.5–15.5)
WBC: 10.2 10*3/uL (ref 4.0–10.5)

## 2016-10-06 LAB — PREALBUMIN: Prealbumin: 12.4 mg/dL — ABNORMAL LOW (ref 18–38)

## 2016-10-06 LAB — VANCOMYCIN, TROUGH: Vancomycin Tr: 24 ug/mL (ref 15–20)

## 2016-10-06 MED ORDER — POTASSIUM CHLORIDE 10 MEQ/50ML IV SOLN
10.0000 meq | INTRAVENOUS | Status: AC
  Administered 2016-10-06 (×3): 10 meq via INTRAVENOUS
  Filled 2016-10-06 (×2): qty 50

## 2016-10-06 MED ORDER — MAGNESIUM SULFATE 2 GM/50ML IV SOLN
2.0000 g | Freq: Once | INTRAVENOUS | Status: AC
  Administered 2016-10-06: 2 g via INTRAVENOUS
  Filled 2016-10-06: qty 50

## 2016-10-06 MED ORDER — MIDAZOLAM HCL 2 MG/2ML IJ SOLN
INTRAMUSCULAR | Status: AC
Start: 2016-10-06 — End: 2016-10-06
  Filled 2016-10-06: qty 2

## 2016-10-06 MED ORDER — POTASSIUM CHLORIDE 10 MEQ/50ML IV SOLN
10.0000 meq | INTRAVENOUS | Status: AC
  Administered 2016-10-06 (×3): 10 meq via INTRAVENOUS
  Filled 2016-10-06: qty 50

## 2016-10-06 MED ORDER — METOPROLOL TARTRATE 25 MG PO TABS
25.0000 mg | ORAL_TABLET | Freq: Four times a day (QID) | ORAL | Status: DC
  Administered 2016-10-06 – 2016-10-10 (×14): 25 mg via ORAL
  Filled 2016-10-06 (×15): qty 1

## 2016-10-06 MED ORDER — LIDOCAINE BOLUS VIA INFUSION
100.0000 mg | Freq: Once | INTRAVENOUS | Status: AC
  Administered 2016-10-06: 100 mg via INTRAVENOUS
  Filled 2016-10-06: qty 100

## 2016-10-06 MED ORDER — AMIODARONE LOAD VIA INFUSION
150.0000 mg | Freq: Once | INTRAVENOUS | Status: AC
Start: 2016-10-06 — End: 2016-10-06
  Administered 2016-10-06: 150 mg via INTRAVENOUS
  Filled 2016-10-06: qty 83.34

## 2016-10-06 MED ORDER — AMIODARONE HCL IN DEXTROSE 360-4.14 MG/200ML-% IV SOLN
30.0000 mg/h | INTRAVENOUS | Status: DC
  Administered 2016-10-06 – 2016-10-09 (×8): 30 mg/h via INTRAVENOUS
  Filled 2016-10-06 (×9): qty 200

## 2016-10-06 MED ORDER — VANCOMYCIN HCL IN DEXTROSE 750-5 MG/150ML-% IV SOLN
750.0000 mg | INTRAVENOUS | Status: DC
  Administered 2016-10-06 – 2016-10-08 (×3): 750 mg via INTRAVENOUS
  Filled 2016-10-06 (×4): qty 150

## 2016-10-06 MED ORDER — AMIODARONE HCL IN DEXTROSE 360-4.14 MG/200ML-% IV SOLN: 60.0000 mg/h | INTRAVENOUS | Status: DC

## 2016-10-06 MED ORDER — MIDAZOLAM HCL 2 MG/2ML IJ SOLN
1.0000 mg | Freq: Once | INTRAMUSCULAR | Status: AC
  Administered 2016-10-06: 1 mg via INTRAVENOUS

## 2016-10-06 MED ORDER — LIDOCAINE IN D5W 4-5 MG/ML-% IV SOLN
1.0000 mg/min | INTRAVENOUS | Status: DC
  Administered 2016-10-06 – 2016-10-08 (×2): 1 mg/min via INTRAVENOUS
  Filled 2016-10-06 (×2): qty 500

## 2016-10-06 MED ORDER — AMIODARONE HCL IN DEXTROSE 360-4.14 MG/200ML-% IV SOLN
INTRAVENOUS | Status: AC
  Filled 2016-10-06: qty 200

## 2016-10-06 MED ORDER — AMIODARONE HCL IN DEXTROSE 360-4.14 MG/200ML-% IV SOLN
60.0000 mg/h | INTRAVENOUS | Status: AC
  Filled 2016-10-06: qty 200

## 2016-10-06 MED ORDER — POTASSIUM CHLORIDE 10 MEQ/50ML IV SOLN
10.0000 meq | INTRAVENOUS | Status: AC
  Administered 2016-10-06 (×3): 10 meq via INTRAVENOUS
  Filled 2016-10-06 (×3): qty 50

## 2016-10-06 MED ORDER — AMIODARONE LOAD VIA INFUSION
150.0000 mg | Freq: Once | INTRAVENOUS | Status: AC
  Administered 2016-10-06: 150 mg via INTRAVENOUS
  Filled 2016-10-06: qty 83.34

## 2016-10-06 MED ORDER — POTASSIUM CHLORIDE 10 MEQ/50ML IV SOLN
10.0000 meq | INTRAVENOUS | Status: DC
  Administered 2016-10-06: 10 meq via INTRAVENOUS
  Filled 2016-10-06 (×3): qty 50

## 2016-10-06 MED ORDER — AMIODARONE HCL IN DEXTROSE 360-4.14 MG/200ML-% IV SOLN: 30.0000 mg/h | INTRAVENOUS | Status: DC

## 2016-10-06 MED ORDER — AMIODARONE IV BOLUS ONLY 150 MG/100ML: 150.0000 mg | Freq: Once | INTRAVENOUS | Status: DC

## 2016-10-06 MED ORDER — AMIODARONE IV BOLUS ONLY 150 MG/100ML
150.0000 mg | Freq: Once | INTRAVENOUS | Status: AC
  Administered 2016-10-06: 150 mg via INTRAVENOUS

## 2016-10-06 NOTE — Progress Notes (Signed)
Pt with recurrent polymorphic VT requiring defibrillation again.  Ongoing salvos of NSVT.  These are PVC initiated. Echo reviewed and discussed with Dr Roxy Manns. Will continue medical management at this time.  Will initiate IV lidocaine with close EP follow-up.  Thompson Grayer MD, [facility] 10/06/2016 5:18 PM

## 2016-10-06 NOTE — Progress Notes (Addendum)
Dr. Servando Snare updated, orders received, second Amio bolus started

## 2016-10-06 NOTE — Plan of Care (Signed)
Problem: Bowel/Gastric: Goal: Gastrointestinal status for postoperative course will improve Outcome: Not Progressing Pt has not had a bowel movement since 6/21, but has active bowel sounds and passing gas  Problem: Cardiac: Goal: Hemodynamic stability will improve Outcome: Not Progressing Pt continues to have runs of V-tach/V-fib that require defibrillation to break out of arrhythmia.   Problem: Respiratory: Goal: Respiratory status will improve Outcome: Not Progressing Pt was on 2L [place] earlier today, but due to low PO2, patient had to be placed on 10L HFNC  Problem: Pain Management: Goal: Pain level will decrease Outcome: Progressing Pt is currently experiencing no pain.  Problem: Urinary Elimination: Goal: Ability to achieve and maintain adequate renal perfusion and functioning will improve Outcome: Progressing Pt continues to have adequate urine output.

## 2016-10-06 NOTE — Progress Notes (Signed)
OT Cancellation Note  Patient Details Name: Charles [name] MRN: [id number] DOB: 09-29-1945   Cancelled Treatment:    Reason Eval/Treat Not Completed: Medical issues which prohibited therapy. Pt with new onset of VT/VF overnight requiring shock x 5. Will follow.  Malka So 10/06/2016, 8:35 AM  [phone]

## 2016-10-06 NOTE — Progress Notes (Signed)
CRITICAL VALUE ALERT  Critical Value:  Vanc trough 24  Date & Time Notied:  10/06/16 0038  Provider Notified: Pharmacist notified  Orders Received/Actions taken: holding dose

## 2016-10-06 NOTE — Consult Note (Signed)
Cardiology Consultation:   Patient ID: Charles [name]; [id number]; 07-14-1945   Admit date: 09/24/2016 Date of Consult: 10/06/2016  Primary Care Provider: [name], MD Primary Cardiologist: Dr. Domenic Polite Primary Electr   Patient Profile:   Charles [name] is a 71 y.o. 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 He was also found to have a 4.4 cm aortic aneurysm.   Initially out patient the patient was reluctant to pursue surgical intervention. He presented to the emergency room at [facility] 09/24/16 with complaints as severe shortness of breath, acute CHF exacerbation and ultimately transferred to [facility], underwent surgery 10/02/16     Human allograft aortic root graft (size 29mm, [employer] ID # [id number])             Repair of false aneurysms of aortic root x2             Reimplantation of left main coronary artery             Resection and grafting of ascending thoracic aortic aneurysm Reversed Greater Saphenous Vein Graft to Distal Right Coronary Artery             Endoscopic Vein Harvest from Right Thigh  who is being seen today for the evaluation of recurrent VT/VF episodes this morning POD #4  at the request of Dr. Roxy Manns.  LABS: K+ 2.8 > 3.2 being actively replaced Mag 2.3, 2.3 s/p additional mag as well BUN/Creat 40/2.13 WBC 10.2 H/H 9.6/30.2 Plts 114 Trop 4.36  S/p IV amiodarone bolus x3  History of Present Illness:   Charles [name] was doing well he tells me he has not had any CP, minimal post operative discomfort.  Overnight last night he developed episodes of VT/VF on 3 occassions, 1st required one shock the second 2shocks, third one shock post shock was bradycardic 30's and paced via his temp wires, was bolused and started on  amiodarone gtt, he did require a 5th shock this morning, his pacer programmed AAI pace 70bpm  The patient is quite anxious this morning given events but not complaining of any CP or SOB.  Past Medical History:  Diagnosis Date  . Anemia   . Aortic insufficiency    a. 07/07/2016: TEE showing bicuspid aortic valve with severe eccentric AI   . Ascending aortic aneurysm (HCC)    4.4 cm by CT 06/2016  . 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 Bicuspid aortic valve   . Chronic periodontitis   . Coronary artery disease   . Pneumonia 1978  . 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 S/P patent foramen ovale closure 10/02/2016    Past Surgical History:  Procedure Laterality Date  . AORTIC VALVE REPLACEMENT N/A 10/02/2016   Procedure: AORTIC ROOT REPLACEMENT  - using 39mm Allograft;  Surgeon: [name], MD;  Location: [place];  Service: Open Heart Surgery;  Laterality: N/A;  . CORONARY ARTERY BYPASS GRAFT N/A 10/02/2016   Procedure: CORONARY ARTERY BYPASS GRAFTING (CABG)  x one, using right leg greater saphenous vein harvested endoscopically;  Surgeon: [name], MD;  Location: [place];  Service: Open Heart Surgery;  Laterality: N/A;  . FALSE ANEURYSM REPAIR  10/02/2016   Procedure: REPAIR FALSE ANEURYSM of Aortic Root Resection and Grafting of Aortic Root;  Surgeon: [name], MD;  Location: [place];  Service: Open Heart Surgery;;  . HERNIA REPAIR Left   . MULTIPLE EXTRACTIONS WITH ALVEOLOPLASTY N/A 08/13/2016   Procedure: Extraction of tooth #'s 2,4,14 with alveoloplasty;  Surgeon: Lenn Cal, DDS;  Location: [place];  Service: Oral Surgery;  Laterality: N/A;  . PATENT  FORAMEN OVALE(PFO) CLOSURE N/A 10/02/2016   Procedure: PATENT FORAMEN OVALE (PFO) CLOSURE;  Surgeon: [name], MD;  Location: [place];  Service: Open Heart Surgery;  Laterality: N/A;  . RIGHT/LEFT HEART CATH AND CORONARY ANGIOGRAPHY N/A 08/03/2016   Procedure: Right/Left Heart Cath and Coronary Angiography;  Surgeon: Burnell Blanks, MD;  Location: [place] [facility];  Service: Cardiovascular;  Laterality: N/A;  . TEE WITHOUT CARDIOVERSION N/A 07/07/2016   Procedure: TRANSESOPHAGEAL ECHOCARDIOGRAM (TEE);  Surgeon: Larey Dresser, MD;  Location: [place];  Service: Cardiovascular;  Laterality: N/A;  . TEE WITHOUT CARDIOVERSION N/A 10/02/2016   Procedure: TRANSESOPHAGEAL ECHOCARDIOGRAM (TEE);  Surgeon: [name], MD;  Location: [place];  Service: Open Heart Surgery;  Laterality: N/A;  . THORACIC AORTIC ANEURYSM REPAIR N/A 10/02/2016   Procedure: THORACIC ASCENDING ANEURYSM REPAIR  - using 57mm Allograft;  Reimplantation of Left Coronary Button;  Surgeon: [name], MD;  Location: [place];  Service: Open Heart Surgery;  Laterality: N/A;     Inpatient Medications: Scheduled Meds: . acetaminophen  1,000 mg Oral Q6H  . aspirin EC  325 mg Oral Daily  . bisacodyl  10 mg Oral Daily   Or  . bisacodyl  10 mg Rectal Daily  . Chlorhexidine Gluconate Cloth  6 each Topical Daily  . docusate sodium  200 mg Oral Daily  . enoxaparin (LOVENOX) injection  30 mg Subcutaneous Q24H  . mouth rinse  15 mL Mouth Rinse BID  . pantoprazole  40 mg Oral Daily  . sodium chloride flush  10-40 mL Intracatheter Q12H  . sodium chloride flush  3 mL Intravenous Q12H   Continuous Infusions: . amiodarone 60 mg/hr (10/06/16 0245)   Followed by  . amiodarone    . cefTRIAXone (ROCEPHIN)  IV Stopped (10/05/16 2030)  . DOPamine Stopped (10/05/16 1700)  . lactated ringers Stopped (10/05/16 2000)  . magnesium sulfate 1 - 4 g bolus IVPB 2 g (10/06/16 0802)  . milrinone Stopped (10/05/16 1000)  .  potassium chloride 10 mEq (10/06/16 0749)  . potassium chloride    . vancomycin     PRN Meds: metoprolol tartrate, morphine injection, ondansetron (ZOFRAN) IV, oxyCODONE, sodium chloride flush, sodium chloride flush, traMADol  Allergies:    Allergies  Allergen Reactions  . No Known Allergies     Social History:   Social History   Social History  . Marital status: Married    Spouse name: N/A  . Number of children: 3  . Years of education: N/A   Occupational History  . Not on file.   Social History Main Topics  . Smoking status: Former Smoker    Types: Cigarettes    Quit date: 04/30/1965  . Smokeless tobacco: Never Used     Comment: Quit 30 years ago.    . Alcohol use No  . Drug  use: No  . Sexual activity: Not on file   Other Topics Concern  . Not on file   Social History Narrative  . No narrative on file    Family History:   The patient's family history includes Heart disease (age of onset: 11) in his father; Ovarian cancer in his mother.  ROS:  Please see the history of present illness.  ROS  All other ROS reviewed and negative.     Physical Exam/Data:   Vitals:   10/06/16 0650 10/06/16 0655 10/06/16 0700 10/06/16 0705  BP: 104/69 107/72 102/71 95/64  Pulse: 80 80 79 79  Resp: 10 11 11  (!) 0  Temp:      TempSrc:      SpO2: 100% 100% 100% 100%  Weight:      Height:        Intake/Output Summary (Last 24 hours) at 10/06/16 0805 Last data filed at 10/06/16 0700  Gross per 24 hour  Intake          1568.52 ml  Output             2915 ml  Net         -1346.48 ml   Filed Weights   10/05/16 0300 10/05/16 0600 10/06/16 0430  Weight: 234 lb 12.6 oz (106.5 kg) 231 lb 0.7 oz (104.8 kg) 224 lb 3.3 oz (101.7 kg)   Body mass index is 30.41 kg/m.  General:  Well nourished, well developed, is anxious/tearful but in no acute distress HEENT: normal Lymph: no adenopathy Neck: no JVD Endocrine:  No thryomegaly Vascular: No carotid bruits; Cardiac:  normal  S1, S2; RRR; 1/6 SM Lungs:  clear to auscultation bilaterally, no wheezing, rhonchi or rales  Abd: soft, nontender  Ext: no edema Musculoskeletal:  No deformities, BUE and BLE strength normal and equal Skin: warm and dry  Neuro:  no gross focal abnormalities noted Psych:  Normal affect    EKG:  The EKG was personally reviewed and demonstrates:  This morning is SR 63bpm, PR 256ms, QRS 18ms Telemetry:  Telemetry was personally reviewed and demonstrates:  A pacing currently w 1st degree AVblock, overnight early AM hours developed brief self limited VT/PMVT episodes, ultimately   Relevant CV Studies: 1. Cardiac Cath 08/03/2016 Conclusion   Ost RCA lesion, 70 %stenosed.  Prox Cx to Mid Cx lesion, 30 %stenosed.  Mid LAD lesion, 20 %stenosed.  Ost 2nd Diag lesion, 20 %stenosed.  Dist LAD lesion, 40 %stenosed.  LV end diastolic pressure is mildly elevated. 1. Severe stenosis ostium of the large dominant RCA. There is dampening of the pressure with catheter engagement of this vessel suggesting the stenosis is severe.  2. Mild disease in the Circumflex and LAD 3. Mildly elevated LV filling pressure 4. Dilated ascending aorta.    TEE 07/07/2016 Left ventricle: The cavity size was moderately dilated. Wall thickness was normal. Systolic function was normal. The estimated ejection fraction was in the range of 60% to 65%. Wall motion was normal; there were no regional wall motion abnormalities. - Aortic valve: The aortic valve was bicuspid. There was eccentric aortic insufficiency that appeared severe. There was no aortic stenosis. No definite vegetation seen. There was a small area of thickening with possible potential space at the valve base adjacent to the RV. The AI appeared to originate from this area. However, I am not convinced this is a vegetation or abscess. - Aorta: The aortic root was dilated to to 4.7 cm with a 4.4 cm  ascending aorta. Diastolic flow  reversal noted in the ascending thoracic aorta. - Mitral valve: No evidence of vegetation. There was mild regurgitation. - Left atrium: No evidence of thrombus in the atrial cavity or appendage. - Right ventricle: The cavity size was normal. Systolic function was normal. - Right atrium: No evidence of thrombus in the atrial cavity or appendage. - Atrial septum: No defect or patent foramen ovale was identified. - Tricuspid valve: No evidence of vegetation. - Pulmonic valve: No evidence of vegetation.  Laboratory Data:  Chemistry Recent Labs Lab 10/04/16 0407 10/04/16 1601 10/05/16 0407 10/05/16 2338 10/06/16 0249  NA 135 136 137 138 136  K 3.9 4.0 3.0* 2.8* 3.2*  CL 102 97* 97*  --  94*  CO2 25  --  29  --  32  GLUCOSE 146* 154* 103* 95 105*  BUN 30* 31* 33*  --  40*  CREATININE 1.50* 1.70* 1.70*  --  2.13*  CALCIUM 8.0*  --  8.2*  --  8.1*  GFRNONAA 46*  --  39*  --  30*  GFRAA 53*  --  46*  --  35*  ANIONGAP 8  --  11  --  10     Recent Labs Lab 10/06/16 0249  PROT 5.3*  ALBUMIN 2.5*  AST 16  ALT 15*  ALKPHOS 67  BILITOT 0.6   Hematology Recent Labs Lab 10/04/16 0407  10/05/16 0407 10/05/16 2338 10/06/16 0249  WBC 18.6*  --  13.5*  --  10.2  RBC 3.51*  --  3.71*  --  3.68*  HGB 9.3*  < > 9.6* 9.5* 9.6*  HCT 28.3*  < > 29.9* 28.0* 30.2*  MCV 80.6  --  80.6  --  82.1  MCH 26.5  --  25.9*  --  26.1  MCHC 32.9  --  32.1  --  31.8  RDW 16.2*  --  15.9*  --  16.1*  PLT 91*  --  94*  --  114*  < > = values in this interval not displayed. Cardiac Enzymes Recent Labs Lab 10/06/16 0556  TROPONINI 4.36*   No results for input(s): TROPIPOC in the last 168 hours.  BNPNo results for input(s): BNP, PROBNP in the last 168 hours.  DDimer No results for input(s): DDIMER in the last 168 hours.  Radiology/Studies:  Dg Chest Port 1 View Result Date: 10/06/2016 CLINICAL DATA:  Status post CABG, aortic valve replacement, PFO closure, and thoracic aortic  aneurysm repair on October 02, 2016. EXAM: PORTABLE CHEST 1 VIEW COMPARISON:  Portable chest x-ray of October 05, 2016 FINDINGS: The lungs are adequately inflated. There is no pneumothorax. There is persistent increased density at the lung bases. There is blunting of the left lateral costophrenic angle. The cardiac silhouette remains enlarged. The pulmonary vascularity is not engorged. There is calcification in the wall of the aortic arch. The PICC line tip projects in the distal third of the SVC. There has been removal of the mediastinal drain. The bilateral chest tubes are stable in position. External pacemaker defibrillator pads remain. The sternal wires are intact. IMPRESSION: Improving appearance of both lungs with decreased interstitial edema. Stable cardiomegaly. Persistent bibasilar subsegmental atelectasis less conspicuous than on the previous study. Tiny left pleural effusion. Electronically Signed   By: David  Martinique M.D.   On: 10/06/2016 07:27     Assessment and Plan:    1. VT/VF episodes requiring 5 shocks this morning     Marked hypokalemia being replaced  Mag was as well given     S/p amiodarone bolus x3 on gtt   Bedside echo in progress Pre-op EF was normal Temp pacing AAI 70bpm currently  Continue electrolyte replacement and amiodarone gtt In d/w Dr. Roxy Manns this AM, Trop 4.36 s/p shocks, he does not suspect acute ischemic event, no acute looking ischemic EKG changes Re-bolus amiodarone if more Keep K+ 4.0 or greater, mag 2.0 or greater   POD #4 today AORTIC ROOT REPLACEMENT  - using 41mm Allograft (N/A) CORONARY ARTERY BYPASS GRAFTING (CABG) x one, using right leg greater saphenous vein harvested endoscopically (N/A) THORACIC ASCENDING ANEURYSM REPAIR  - using 47mm Allograft;  Reimplantation of Left Coronary Button (N/A) TRANSESOPHAGEAL ECHOCARDIOGRAM (TEE) (N/A) PATENT FORAMEN OVALE (PFO) CLOSURE (N/A) REPAIR FALSE ANEURYSM of Aortic Root Resection and Grafting of Aortic  Root    Signed, Renee Dyane Dustman, PA-C  10/06/2016 8:05 AM   I have seen, examined the patient, and reviewed the above assessment and plan.  On exam, RRR.  Changes to above are made where necessary.  Pt with polymorphic VT which appears to be initiated by a monomorphic PVC.  Currently off pressors and on IV amiodarone.  Likely due to inflammatory post op response.  Ischemia is less likely but is to be considered. With temp pacing, he wenckebachs at 82 bpm.  I have therefore programmed him AAI 76 bpm.  Awaiting echo to evaluate for any changes to his EF that might suggest an ischemic cause.  Will add low dose beta blocker and titrate as able.  Continue on IV amiodarone.  Avoid pressors for now.  Very sick individual at risk for decompensation/ further ventricular arrhythmias.  A high level of decision making was required for this encounter.  EP to follow closely with you.  Co Sign: Thompson Grayer, MD 10/06/2016 9:23 AM

## 2016-10-06 NOTE — Procedures (Signed)
Arterial Catheter Insertion Procedure Note Charles [name] [id number] [birth year]  Procedure: Insertion of Arterial Catheter  Indications: Blood pressure monitoring  Procedure Details Consent: Risks of procedure as well as the alternatives and risks of each were explained to the (patient/caregiver).  Consent for procedure obtained. Time Out: Verified patient identification, verified procedure, site/side was marked, verified correct patient position, special equipment/implants available, medications/allergies/relevent history reviewed, required imaging and test results available.  Performed  Maximum sterile technique was used including antiseptics, cap, gloves, gown, hand hygiene, mask and sheet. Skin prep: Chlorhexidine; local anesthetic administered 22 gauge catheter was inserted into left radial artery using the Seldinger technique.  Evaluation Blood flow good; BP tracing good. Complications: No apparent complications.   Charles [name] 10/06/2016

## 2016-10-06 NOTE — Progress Notes (Signed)
Pharmacy Antibiotic Note Charles [name] is a 71 y.o. male  with AV valve vegetation s/p CABG and aortic root replacement on 6/22. Vancomycin trough tonight supratherapeutic Plan: Change Vancomycin 750 mg IV q24h   Height: 6' (182.9 cm) Weight: 231 lb 0.7 oz (104.8 kg) IBW/kg (Calculated) : 77.6  Temp (24hrs), Avg:97.9 F (36.6 C), Min:97.7 F (36.5 C), Max:98.2 F (36.8 C)   Recent Labs Lab 10/01/16 0849  10/02/16 1655  10/03/16 0453 10/03/16 1550 10/03/16 1602 10/04/16 0407 10/04/16 1601 10/05/16 0407 10/05/16 2325  WBC  --   < > 23.2*  --  20.1* 17.9*  --  18.6*  --  13.5*  --   CREATININE 1.40*  < >  --   < > 1.38* 1.49* 1.40* 1.50* 1.70* 1.70*  --   VANCOTROUGH 28*  --   --   --   --   --   --   --   --   --  24*  < > = values in this interval not displayed.  Estimated Creatinine Clearance: 51.3 mL/min (A) (by C-G formula based on SCr of 1.7 mg/dL (H)).    Allergies  Allergen Reactions  . No Known Allergies     Antimicrobials this admission: Unasyn 6/15-6/18  Ceftriaxone 6/18 >>   Vancomycin 6/18 >>   Phillis Knack, PharmD, BCPS  10/06/2016, 12:49 AM

## 2016-10-06 NOTE — Progress Notes (Signed)
Amio bolus infusing, pt continuing to have periods of symptomic arrythmias

## 2016-10-06 NOTE — Progress Notes (Signed)
Dr. Roxy Manns at bedside, pt shocked x1 at 150J for Vfib, returned to SR after shock

## 2016-10-06 NOTE — Progress Notes (Signed)
Patient ID: Charles [name], male   DOB: 04/19/1945, 71 y.o.   MRN: [id number]  SICU Evening Rounds  Hemodynamically stable. VVI paced 70  Had pulseless VT today requiring defib and CPR. Now on amio 30 and lidocaine 1. Had 8 beat run of VT this pm but came out of it. Has been seen by EP.  Awake and alert sats 100%  Urine output ok  BMET    Component Value Date/Time   NA 134 (L) 10/06/2016 1615   K 5.3 (H) 10/06/2016 1615   CL 94 (L) 10/06/2016 1615   CO2 29 10/06/2016 1615   GLUCOSE 175 (H) 10/06/2016 1615   BUN 40 (H) 10/06/2016 1615   CREATININE 1.97 (H) 10/06/2016 1615   CALCIUM 8.0 (L) 10/06/2016 1615   GFRNONAA 33 (L) 10/06/2016 1615   GFRAA 38 (L) 10/06/2016 1615     Mg2+ 2.3 this am.

## 2016-10-06 NOTE — Progress Notes (Signed)
Pt in sustained vfib with loss of consciousness, shocked 120 J, return to brady rhythm with pulse, pt awakened, epicardial pacer turned on

## 2016-10-06 NOTE — Progress Notes (Addendum)
Pt in sustained vfib, loss of consciousness, shockedx2 with 120 and 150 J, back in normal rhythm, pt awake and questions answered, AM labs sent

## 2016-10-06 NOTE — Progress Notes (Signed)
Dr. Servando Snare called regarding arrythmia, orders for Amiodarone bolus and gtt received and entered

## 2016-10-06 NOTE — Progress Notes (Signed)
CRITICAL VALUE ALERT  Critical Value:  Trop 4.36  Date & Time Notied:  5258 10/06/16  Provider Notified: Dr. Servando Snare and Dr. Roxy Manns  Orders Received/Actions taken: n/a

## 2016-10-06 NOTE — Progress Notes (Signed)
Pt shocked with 200J for Vfib, pt back in SR and regained conscious immediately

## 2016-10-06 NOTE — Progress Notes (Addendum)
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  Sore from shock but o/w denies chest pain, SOB.  Hungry for breakfast.  Objective: Vital signs: BP Readings from Last 1 Encounters:  10/06/16 95/64   Pulse Readings from Last 1 Encounters:  10/06/16 79   Resp Readings from Last 1 Encounters:  10/06/16 (!) 0   Temp Readings from Last 1 Encounters:  10/06/16 97.7 F (36.5 C) (Oral)    Hemodynamics:   Mixed venous co-ox 64%   Physical Exam:  Rhythm:   Sinus brady 55-60  Breath sounds: clear  Heart sounds:  RRR w/out murmur  Incisions:  Clean and dry  Abdomen:  Soft, non-distended, non-tender  Extremities:  Warm, well-perfused    Intake/Output from previous day: 06/25 0701 - 06/26 0700 In: 1681.9 [P.O.:340; I.V.:791.9; IV Piggyback:550] Out: 4967 [Urine:3175; Chest Tube:140] Intake/Output this shift: No intake/output data recorded.  Lab Results:  CBC: Recent Labs  10/05/16 0407 10/05/16 2338 10/06/16 0249  WBC 13.5*  --  10.2  HGB 9.6* 9.5* 9.6*  HCT 29.9* 28.0* 30.2*  PLT 94*  --  114*    BMET:  Recent Labs  10/05/16 0407 10/05/16 2338 10/06/16 0249  NA 137 138 136  K 3.0* 2.8* 3.2*  CL 97*  --  94*  CO2 29  --  32  GLUCOSE 103* 95 105*  BUN 33*  --  40*  CREATININE 1.70*  --  2.13*  CALCIUM 8.2*  --  8.1*      PT/INR:  No results for input(s): LABPROT, INR in the last 72 hours.  CBG (last 3)   Recent Labs  10/05/16 1207 10/05/16 1618 10/05/16 2018  GLUCAP 110* 97 147*    ABG    Component Value Date/Time   PHART 7.455 (H) 10/05/2016 0424   PCO2ART 46.2 10/05/2016 0424   PO2ART 66.0 (L) 10/05/2016 0424   HCO3 32.6 (H) 10/05/2016 0424   TCO2 34 10/05/2016 0424   ACIDBASEDEF 1.0 10/02/2016 2329   O2SAT 64.0 10/06/2016 0428    CXR: PORTABLE CHEST 1 VIEW  COMPARISON:  Portable chest x-ray of October 05, 2016  FINDINGS: The lungs are adequately inflated. There is no pneumothorax. There is persistent increased density at the lung bases. There is blunting of the left lateral costophrenic angle. The cardiac silhouette remains enlarged. The pulmonary vascularity is not engorged. There is calcification in the wall of the aortic arch. The PICC line tip projects in the distal third of the SVC. There has been removal of the mediastinal drain. The bilateral chest tubes are stable in position. External pacemaker defibrillator pads remain. The sternal wires are intact.  IMPRESSION: Improving appearance of both lungs with decreased interstitial edema. Stable cardiomegaly. Persistent bibasilar subsegmental atelectasis less conspicuous than on the previous study. Tiny left pleural effusion.  Electronically Signed   By: David  Martinique M.D.   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  No ongoing chest pain nor EKG changes to suggest acute MI.  Oxygenation and CXR appearance improving.  Potassium low but being replaced.  Remainder of labs okay. Receiving IV amiodarone - has received 150 mg IV bolus x3  Will get STAT ECHO at bedside.  Consult EPS.  Continue amiodarone.  Supplement potassium and magnesium aggressively.  Lasix on hold.  [name], MD 10/06/2016 7:39 AM

## 2016-10-06 NOTE — Progress Notes (Signed)
  Echocardiogram 2D Echocardiogram has been performed.  Charles [name] 10/06/2016, 9:04 AM

## 2016-10-06 NOTE — Progress Notes (Addendum)
Patient went into sustained VT at 1539. Patient had no pulse and was unresponsive. At that point a code was called.Patient was shocked once and CPR was started for approximately 10-15 seconds. Patient was shocked a second time and ROSC was achieved. Patient then regained consciousness. Dr. Rayann Heman and Dr. Roxy Manns were paged and informed of the event. ABG, 150mg  Amiodarone bolus were ordered per Dr. Servando Snare. BMET and KCL replacement were ordered per Dr. Roxy Manns. Will continue to monitor patient closely.

## 2016-10-07 ENCOUNTER — Encounter (HOSPITAL_COMMUNITY): Payer: Self-pay | Admitting: Certified Registered"

## 2016-10-07 ENCOUNTER — Inpatient Hospital Stay (HOSPITAL_COMMUNITY): Payer: Medicare Other

## 2016-10-07 ENCOUNTER — Inpatient Hospital Stay (HOSPITAL_COMMUNITY)
Admission: EM | Disposition: A | Discharge status: Skilled Nursing Facility | Payer: Self-pay | Source: Home / Self Care | Attending: Thoracic Surgery (Cardiothoracic Vascular Surgery)

## 2016-10-07 DIAGNOSIS — I2510 Atherosclerotic heart disease of native coronary artery without angina pectoris: Secondary | ICD-10-CM

## 2016-10-07 DIAGNOSIS — I472 Ventricular tachycardia, unspecified: Secondary | ICD-10-CM

## 2016-10-07 DIAGNOSIS — I350 Nonrheumatic aortic (valve) stenosis: Secondary | ICD-10-CM

## 2016-10-07 HISTORY — PX: LEFT HEART CATH AND CORS/GRAFTS ANGIOGRAPHY: CATH118250

## 2016-10-07 LAB — CBC
HEMATOCRIT: 29.2 % — AB (ref 39.0–52.0)
HEMOGLOBIN: 9.2 g/dL — AB (ref 13.0–17.0)
MCH: 26.2 pg (ref 26.0–34.0)
MCHC: 31.5 g/dL (ref 30.0–36.0)
MCV: 83.2 fL (ref 78.0–100.0)
Platelets: 155 10*3/uL (ref 150–400)
RBC: 3.51 MIL/uL — ABNORMAL LOW (ref 4.22–5.81)
RDW: 16 % — ABNORMAL HIGH (ref 11.5–15.5)
WBC: 8.1 10*3/uL (ref 4.0–10.5)

## 2016-10-07 LAB — AEROBIC/ANAEROBIC CULTURE (SURGICAL/DEEP WOUND)

## 2016-10-07 LAB — MAGNESIUM: MAGNESIUM: 2.7 mg/dL — AB (ref 1.7–2.4)

## 2016-10-07 LAB — AEROBIC/ANAEROBIC CULTURE W GRAM STAIN (SURGICAL/DEEP WOUND): Culture: NO GROWTH

## 2016-10-07 LAB — COOXEMETRY PANEL
Carboxyhemoglobin: 0.8 % (ref 0.5–1.5)
Carboxyhemoglobin: 0.9 % (ref 0.5–1.5)
METHEMOGLOBIN: 1.1 % (ref 0.0–1.5)
Methemoglobin: 1.1 % (ref 0.0–1.5)
O2 Saturation: 65.1 %
O2 Saturation: 95.4 %
TOTAL HEMOGLOBIN: 9 g/dL — AB (ref 12.0–16.0)
Total hemoglobin: 9.8 g/dL — ABNORMAL LOW (ref 12.0–16.0)

## 2016-10-07 LAB — COMPREHENSIVE METABOLIC PANEL
ALBUMIN: 2.2 g/dL — AB (ref 3.5–5.0)
ALK PHOS: 75 U/L (ref 38–126)
ALT: 16 U/L — ABNORMAL LOW (ref 17–63)
ANION GAP: 8 (ref 5–15)
AST: 13 U/L — ABNORMAL LOW (ref 15–41)
BILIRUBIN TOTAL: 0.4 mg/dL (ref 0.3–1.2)
BUN: 39 mg/dL — ABNORMAL HIGH (ref 6–20)
CALCIUM: 8 mg/dL — AB (ref 8.9–10.3)
CO2: 31 mmol/L (ref 22–32)
Chloride: 95 mmol/L — ABNORMAL LOW (ref 101–111)
Creatinine, Ser: 1.74 mg/dL — ABNORMAL HIGH (ref 0.61–1.24)
GFR calc Af Amer: 44 mL/min — ABNORMAL LOW (ref >60)
GFR, EST NON AFRICAN AMERICAN: 38 mL/min — AB (ref >60)
GLUCOSE: 151 mg/dL — AB (ref 65–99)
Potassium: 3.1 mmol/L — ABNORMAL LOW (ref 3.5–5.1)
Sodium: 134 mmol/L — ABNORMAL LOW (ref 135–145)
TOTAL PROTEIN: 5.2 g/dL — AB (ref 6.5–8.1)

## 2016-10-07 LAB — POCT I-STAT 3, ART BLOOD GAS (G3+)
ACID-BASE EXCESS: 10 mmol/L — AB (ref 0.0–2.0)
BICARBONATE: 34.6 mmol/L — AB (ref 20.0–28.0)
O2 Saturation: 98 %
TCO2: 36 mmol/L (ref 0–100)
pCO2 arterial: 44.4 mmHg (ref 32.0–48.0)
pH, Arterial: 7.499 — ABNORMAL HIGH (ref 7.350–7.450)
pO2, Arterial: 105 mmHg (ref 83.0–108.0)

## 2016-10-07 LAB — BASIC METABOLIC PANEL
Anion gap: 7 (ref 5–15)
BUN: 32 mg/dL — ABNORMAL HIGH (ref 6–20)
CALCIUM: 7.7 mg/dL — AB (ref 8.9–10.3)
CO2: 29 mmol/L (ref 22–32)
CREATININE: 1.38 mg/dL — AB (ref 0.61–1.24)
Chloride: 97 mmol/L — ABNORMAL LOW (ref 101–111)
GFR calc non Af Amer: 51 mL/min — ABNORMAL LOW (ref >60)
GFR, EST AFRICAN AMERICAN: 59 mL/min — AB (ref >60)
GLUCOSE: 102 mg/dL — AB (ref 65–99)
Potassium: 3.5 mmol/L (ref 3.5–5.1)
Sodium: 133 mmol/L — ABNORMAL LOW (ref 135–145)

## 2016-10-07 SURGERY — LEFT HEART CATH AND CORS/GRAFTS ANGIOGRAPHY
Anesthesia: LOCAL

## 2016-10-07 MED ORDER — FENTANYL CITRATE (PF) 100 MCG/2ML IJ SOLN
INTRAMUSCULAR | Status: DC | PRN
  Administered 2016-10-07: 50 ug via INTRAVENOUS

## 2016-10-07 MED ORDER — MIDAZOLAM HCL 2 MG/2ML IJ SOLN
INTRAMUSCULAR | Status: AC
  Filled 2016-10-07: qty 2

## 2016-10-07 MED ORDER — FENTANYL CITRATE (PF) 100 MCG/2ML IJ SOLN
INTRAMUSCULAR | Status: AC
  Filled 2016-10-07: qty 2

## 2016-10-07 MED ORDER — SODIUM CHLORIDE 0.9% FLUSH
3.0000 mL | INTRAVENOUS | Status: DC | PRN
Start: 2016-10-07 — End: 2016-10-08

## 2016-10-07 MED ORDER — SODIUM CHLORIDE 0.9 % IV SOLN
INTRAVENOUS | Status: DC
  Administered 2016-10-07: 19:00:00 via INTRAVENOUS

## 2016-10-07 MED ORDER — IOPAMIDOL (ISOVUE-370) INJECTION 76%
INTRAVENOUS | Status: DC | PRN
Start: 2016-10-07 — End: 2016-10-07
  Administered 2016-10-07: 155 mL via INTRA_ARTERIAL

## 2016-10-07 MED ORDER — IOPAMIDOL (ISOVUE-370) INJECTION 76%
INTRAVENOUS | Status: AC
  Filled 2016-10-07: qty 100

## 2016-10-07 MED ORDER — POTASSIUM CHLORIDE 10 MEQ/50ML IV SOLN
10.0000 meq | INTRAVENOUS | Status: AC | PRN
  Administered 2016-10-07 (×3): 10 meq via INTRAVENOUS
  Filled 2016-10-07 (×3): qty 50

## 2016-10-07 MED ORDER — VERAPAMIL HCL 2.5 MG/ML IV SOLN
INTRAVENOUS | Status: AC
  Filled 2016-10-07: qty 2

## 2016-10-07 MED ORDER — IOPAMIDOL (ISOVUE-370) INJECTION 76%
INTRAVENOUS | Status: AC
  Filled 2016-10-07: qty 50

## 2016-10-07 MED ORDER — MIDAZOLAM HCL 2 MG/2ML IJ SOLN
INTRAMUSCULAR | Status: DC | PRN
  Administered 2016-10-07: 2 mg via INTRAVENOUS

## 2016-10-07 MED ORDER — SODIUM CHLORIDE 0.9 % IV SOLN
INTRAVENOUS | Status: DC
  Administered 2016-10-07: 12:00:00 via INTRAVENOUS

## 2016-10-07 MED ORDER — SODIUM CHLORIDE 0.9% FLUSH: 3.0000 mL | INTRAVENOUS | Status: DC | PRN

## 2016-10-07 MED ORDER — SODIUM CHLORIDE 0.9% FLUSH: 3.0000 mL | Freq: Two times a day (BID) | INTRAVENOUS | Status: DC

## 2016-10-07 MED ORDER — LIDOCAINE HCL (PF) 1 % IJ SOLN
INTRAMUSCULAR | Status: DC | PRN
Start: 2016-10-07 — End: 2016-10-07
  Administered 2016-10-07: 10 mL via SUBCUTANEOUS
  Administered 2016-10-07: 2 mL via SUBCUTANEOUS

## 2016-10-07 MED ORDER — SODIUM CHLORIDE 0.9 % IV SOLN: 250.0000 mL | INTRAVENOUS | Status: DC | PRN

## 2016-10-07 MED ORDER — POTASSIUM CHLORIDE 10 MEQ/50ML IV SOLN
10.0000 meq | INTRAVENOUS | Status: AC
  Administered 2016-10-07 (×3): 10 meq via INTRAVENOUS
  Filled 2016-10-07 (×3): qty 50

## 2016-10-07 MED ORDER — LIDOCAINE HCL 1 % IJ SOLN
INTRAMUSCULAR | Status: AC
  Filled 2016-10-07: qty 20

## 2016-10-07 MED ORDER — HEPARIN (PORCINE) IN NACL 2-0.9 UNIT/ML-% IJ SOLN
INTRAMUSCULAR | Status: AC
Start: 2016-10-07 — End: 2016-10-07
  Filled 2016-10-07: qty 1000

## 2016-10-07 MED ORDER — POTASSIUM CHLORIDE 10 MEQ/50ML IV SOLN
10.0000 meq | INTRAVENOUS | Status: AC | PRN
  Administered 2016-10-07 – 2016-10-08 (×3): 10 meq via INTRAVENOUS
  Filled 2016-10-07 (×3): qty 50

## 2016-10-07 MED ORDER — HEPARIN (PORCINE) IN NACL 2-0.9 UNIT/ML-% IJ SOLN
INTRAMUSCULAR | Status: AC | PRN
  Administered 2016-10-07: 1000 mL

## 2016-10-07 SURGICAL SUPPLY — 13 items
CATH INFINITI 5 FR AR1 MOD (CATHETERS) ×2 IMPLANT
CATH INFINITI 5 FR RCB (CATHETERS) ×2 IMPLANT
CATH INFINITI 5FR MULTPACK ANG (CATHETERS) ×2 IMPLANT
GLIDESHEATH SLEND SS 6F .021 (SHEATH) ×2 IMPLANT
GUIDEWIRE INQWIRE 1.5J.035X260 (WIRE) ×1 IMPLANT
HOVERMATT SINGLE USE (MISCELLANEOUS) ×2 IMPLANT
INQWIRE 1.5J .035X260CM (WIRE) ×2
KIT HEART LEFT (KITS) ×2 IMPLANT
PACK CARDIAC CATHETERIZATION (CUSTOM PROCEDURE TRAY) ×2 IMPLANT
SHEATH PINNACLE 5F 10CM (SHEATH) ×2 IMPLANT
SYR MEDRAD MARK V 150ML (SYRINGE) ×2 IMPLANT
TRANSDUCER W/STOPCOCK (MISCELLANEOUS) ×2 IMPLANT
TUBING CIL FLEX 10 FLL-RA (TUBING) ×2 IMPLANT

## 2016-10-07 NOTE — Progress Notes (Signed)
  Amiodarone Drug - Drug Interaction Consult Note  Recommendations: Monitor closely on amiodarone + lidocaine.  Also monitor for signs or sxs of lidocaine toxicity.  Amiodarone is metabolized by the cytochrome P450 system and therefore has the potential to cause many drug interactions. Amiodarone has an average plasma half-life of 50 days (range 20 to 100 days).   There is potential for drug interactions to occur several weeks or months after stopping treatment and the onset of drug interactions may be slow after initiating amiodarone.   []  Statins: Increased risk of myopathy. Simvastatin- restrict dose to 20mg  daily. Other statins: counsel patients to report any muscle pain or weakness immediately.  []  Anticoagulants: Amiodarone can increase anticoagulant effect. Consider warfarin dose reduction. Patients should be monitored closely and the dose of anticoagulant altered accordingly, remembering that amiodarone levels take several weeks to stabilize.  []  Antiepileptics: Amiodarone can increase plasma concentration of phenytoin, the dose should be reduced. Note that small changes in phenytoin dose can result in large changes in levels. Monitor patient and counsel on signs of toxicity.  [x]  Beta blockers: increased risk of bradycardia, AV block and myocardial depression. Sotalol - avoid concomitant use.  []   Calcium channel blockers (diltiazem and verapamil): increased risk of bradycardia, AV block and myocardial depression.  []   Cyclosporine: Amiodarone increases levels of cyclosporine. Reduced dose of cyclosporine is recommended.  []  Digoxin dose should be halved when amiodarone is started.  []  Diuretics: increased risk of cardiotoxicity if hypokalemia occurs.  []  Oral hypoglycemic agents (glyburide, glipizide, glimepiride): increased risk of hypoglycemia. Patient's glucose levels should be monitored closely when initiating amiodarone therapy.   []  Drugs that prolong the QT interval:   Torsades de pointes risk may be increased with concurrent use - avoid if possible.  Monitor QTc, also keep magnesium/potassium WNL if concurrent therapy can't be avoided. Marland Kitchen Antibiotics: e.g. fluoroquinolones, erythromycin. . Antiarrhythmics: e.g. quinidine, procainamide, disopyramide, sotalol. . Antipsychotics: e.g. phenothiazines, haloperidol.  . Lithium, tricyclic antidepressants, and methadone. Thank You,  Pat Patrick  10/07/2016 8:06 AM

## 2016-10-07 NOTE — Clinical Social Work Note (Signed)
Clinical Social Work Assessment  Patient Details  Name: Charles [name] MRN: [id number] Date of Birth: [birth year]  Date of referral:  10/07/16               Reason for consult:  Facility Placement                Permission sought to share information with:  Facility Art therapist granted to share information::  Yes, Verbal Permission Granted  Name::        Agency::  SNF  Relationship::     Contact Information:     Housing/Transportation Living arrangements for the past 2 months:  Single Family Home Source of Information:  Patient Patient Interpreter Needed:  None Criminal Activity/Legal Involvement Pertinent to Current Situation/Hospitalization:  No - Comment as needed Significant Relationships:  Spouse, Adult Children Lives with:  Spouse, Adult Children (son lives in apartment above their home) Do you feel safe going back to the place where you live?  No Need for family participation in patient care:  No (Coment)  Care giving concerns:  Pt lives at home with 34yo wife- patient states she can help with cooking and driving but she would be unable to help physically.   Social Worker assessment / plan:  CSW spoke with pt concerning PT recommendation for rehab when discharged.  Explained CSW role in coordinating rehab if patient would need SNF.    Employment status:  Retired Nurse, adult PT Recommendations:  Inpatient [place] / Referral to community resources:  [place]  Patient/Family's Response to care:  Pt agreeable to SNF placement if needed- acknowledges that wife would be unable to care for him physically so if he is unable to do basic mobility without assistance it would not be safe to go home.  Patient/Family's Understanding of and Emotional Response to Diagnosis, Current Treatment, and Prognosis:  Pt has good understanding of condition and has no concerns at this time- hopeful that he will  continue to improve and be able to return home soon.  Emotional Assessment Appearance:  Appears stated age Attitude/Demeanor/Rapport:    Affect (typically observed):  Appropriate Orientation:  Oriented to Self, Oriented to Place, Oriented to  Time, Oriented to Situation Alcohol / Substance use:  Not Applicable Psych involvement (Current and /or in the community):  No (Comment)  Discharge Needs  Concerns to be addressed:  Care Coordination Readmission within the last 30 days:  No Current discharge risk:  Physical Impairment Barriers to Discharge:  Continued Medical Work up   Charles Ny, LCSW 10/07/2016, 10:49 AM

## 2016-10-07 NOTE — Progress Notes (Signed)
OT Cancellation Note  Patient Details Name: KODEY XUE MRN: [id number] DOB: 1946-02-10   Cancelled Treatment:    Reason Eval/Treat Not Completed: Medical issues which prohibited therapy.  Pt on bedrest per MD note.  Will check back tomorrow.  Alia Parsley [place], OTR/L [phone]   Lucille Passy M 10/07/2016, 1:39 PM

## 2016-10-07 NOTE — Progress Notes (Signed)
Patient ID: Charles [name], male   DOB: 08/12/1945, 71 y.o.   MRN: [id number] EVENING ROUNDS NOTE :     [address]       [place] [address]             [phone]                 Day of Surgery Procedure(s) (LRB): Left Heart Cath and Cors/Grafts Angiography (N/A)  Total Length of Stay:  LOS: 13 days  BP 113/78   Pulse 70   Temp 98.6 F (37 C) (Core (Comment)) Comment (Src): Foley  Resp (!) 22   Ht 6' (1.829 m)   Wt 222 lb 14.4 oz (101.1 kg)   SpO2 99%   BMI 30.23 kg/m   .Intake/Output      06/26 0701 - 06/27 0700 06/27 0701 - 06/28 0700   P.O. 360    I.V. (mL/kg) 757.9 (7.5) 498.6 (4.9)   Other 120    IV Piggyback 550 100   Total Intake(mL/kg) 1787.9 (17.7) 598.6 (5.9)   Urine (mL/kg/hr) 1205 (0.5) 485 (0.4)   Chest Tube 65 (0)    Total Output 1270 485   Net +517.9 +113.6          . sodium chloride    . sodium chloride 50 mL/hr at 10/07/16 1600  . amiodarone 30 mg/hr (10/07/16 1600)  . [MAR Hold] cefTRIAXone (ROCEPHIN)  IV Stopped (10/06/16 2041)  . lidocaine 1 mg/min (10/07/16 1600)  . [MAR Hold] vancomycin Stopped (10/07/16 1032)     Lab Results  Component Value Date   WBC 8.1 10/07/2016   HGB 9.2 (L) 10/07/2016   HCT 29.2 (L) 10/07/2016   PLT 155 10/07/2016   GLUCOSE 151 (H) 10/07/2016   CHOL 122 07/03/2016   TRIG 125 07/03/2016   HDL 16 (L) 07/03/2016   LDLCALC 81 07/03/2016   ALT 16 (L) 10/07/2016   AST 13 (L) 10/07/2016   NA 134 (L) 10/07/2016   K 3.1 (L) 10/07/2016   CL 95 (L) 10/07/2016   CREATININE 1.74 (H) 10/07/2016   BUN 39 (H) 10/07/2016   CO2 31 10/07/2016   TSH 1.300 07/01/2016   INR 1.74 10/02/2016   HGBA1C 5.4 10/01/2016   No vt today Cr  1.74 stable  Cath done today, left  groin ok  Procedures   Left Heart Cath and Cors/Grafts Angiography  Conclusion     Prox Cx to Mid Cx lesion, 30 %stenosed.  Mid LAD lesion, 20 %stenosed.  Ost 2nd Diag lesion, 20 %stenosed.  Dist LAD lesion, 40  %stenosed.  Ost RCA lesion, 100 %stenosed.  SVG graft was visualized by angiography and is very large and anatomically normal.   1. 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 Patent SVG to PDA. The entire RCA fills from the patent vein graft.  3. Mild disease in the Circumflex and LAD. No change since last cath.  4. Normal LVEDP  Recommendations: No further ischemic workup        Grace Isaac MD  Beeper [phone] Office [phone] 10/07/2016 6:56 PM

## 2016-10-07 NOTE — H&P (View-Only) (Signed)
Electrophysiology Rounding Note  Patient Name: Charles [name] Date of Encounter: 10/07/2016    Subjective   No Vt overnight.  Denies CP or SOB currently.  Inpatient Medications    Scheduled Meds: . acetaminophen  1,000 mg Oral Q6H  . aspirin EC  325 mg Oral Daily  . bisacodyl  10 mg Oral Daily   Or  . bisacodyl  10 mg Rectal Daily  . Chlorhexidine Gluconate Cloth  6 each Topical Daily  . docusate sodium  200 mg Oral Daily  . enoxaparin (LOVENOX) injection  30 mg Subcutaneous Q24H  . mouth rinse  15 mL Mouth Rinse BID  . metoprolol tartrate  25 mg Oral Q6H  . pantoprazole  40 mg Oral Daily  . sodium chloride flush  10-40 mL Intracatheter Q12H  . sodium chloride flush  3 mL Intravenous Q12H   Continuous Infusions: . amiodarone 30 mg/hr (10/07/16 1100)  . cefTRIAXone (ROCEPHIN)  IV Stopped (10/06/16 2041)  . lidocaine 1 mg/min (10/07/16 1100)  . potassium chloride 10 mEq (10/07/16 0743)  . potassium chloride 10 mEq (10/07/16 1047)  . vancomycin Stopped (10/07/16 1032)   PRN Meds: metoprolol tartrate, morphine injection, ondansetron (ZOFRAN) IV, oxyCODONE, potassium chloride, sodium chloride flush, sodium chloride flush, traMADol   Vital Signs    Vitals:   10/07/16 0800 10/07/16 0900 10/07/16 1000 10/07/16 1100  BP: 98/67 103/70 106/72 103/69  Pulse: 69 70 69 70  Resp: 18 (!) 21 (!) 21 (!) 24  Temp:      TempSrc:      SpO2: 99% 98% 100% 99%  Weight:      Height:        Intake/Output Summary (Last 24 hours) at 10/07/16 1126 Last data filed at 10/07/16 1100  Gross per 24 hour  Intake           1582.7 ml  Output             1310 ml  Net            272.7 ml   Filed Weights   10/05/16 0600 10/06/16 0430 10/07/16 0500  Weight: 231 lb 0.7 oz (104.8 kg) 224 lb 3.3 oz (101.7 kg) 222 lb 14.4 oz (101.1 kg)    Physical Exam    GEN- The patient is ill appearing, alert and oriented x 3 today.   Head- normocephalic, atraumatic Eyes-  Sclera clear, conjunctiva  pink Ears- hearing intact Oropharynx- clear Neck- supple Lungs- Clear to ausculation bilaterally, normal work of breathing Heart- Regular rate and rhythm  GI- soft, NT, ND, + BS Extremities- no clubbing, cyanosis, or edema Skin- no rash or lesion Psych- euthymic mood, full affect Neuro- strength and sensation are intact  Labs    CBC  Recent Labs  10/06/16 0249 10/06/16 1259 10/07/16 0444  WBC 10.2  --  8.1  HGB 9.6* 9.2* 9.2*  HCT 30.2* 27.0* 29.2*  MCV 82.1  --  83.2  PLT 114*  --  494   Basic Metabolic Panel  Recent Labs  10/06/16 0249  10/06/16 1615 10/07/16 0444  NA 136  < > 134* 134*  K 3.2*  < > 5.3* 3.1*  CL 94*  --  94* 95*  CO2 32  --  29 31  GLUCOSE 105*  < > 175* 151*  BUN 40*  --  40* 39*  CREATININE 2.13*  --  1.97* 1.74*  CALCIUM 8.1*  --  8.0* 8.0*  MG 2.3  --   --  2.7*  < > = values in this interval not displayed. Liver Function Tests  Recent Labs  10/06/16 0249 10/07/16 0444  AST 16 13*  ALT 15* 16*  ALKPHOS 67 75  BILITOT 0.6 0.4  PROT 5.3* 5.2*  ALBUMIN 2.5* 2.2*   No results for input(s): LIPASE, AMYLASE in the last 72 hours. Cardiac Enzymes  Recent Labs  10/06/16 0556 10/06/16 1124 10/06/16 1615  TROPONINI 4.36* 2.89* 3.66*    Telemetry    V paced, with cessation of VT pacing, nonsustained PMVT returned (personally reviewed)   Patient Profile     Charles [name] is a 71 y.o. male with a past medical history significant for endocarditis.  He is having VT s/p AVR.   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  Interestingly, atrial pacing was not as effective. I am not convinced that amiodarone or lidocaine are very effective as episodes clearly resume with termination of V pacing today.   Would not make any changes at this time.  If no further VT, would stop lidocaine tomorrow and begin conversion of amiodarone to oral.  Would continue V pacing for at  least 48 hours and then reassess.  Hopefully as he continues to recover from his surgery, ventricular ectopy will improve. Once ectopy is resolved, would consider lifevest at discharge.  I am not enthusiastic about device implantation given his prior endocarditis.  Hopefully as he recovers from surgery, his PMVT will prove to be from a reversible cause.  2. S/p AVR I spoke with Dr Roxy Manns this am.  Will plan cath today to assess ostia of the cors to make sure that ischemia is not a cause for VT.  EP to follow closely Dr Lovena Le to see tomorrow and Friday as I will be away  Signed, Thompson Grayer, MD  10/07/2016, 11:26 AM

## 2016-10-07 NOTE — Interval H&P Note (Signed)
History and Physical Interval Note:  10/07/2016 4:45 PM  Charles [name]  has presented today for cardiac cath with the diagnosis of VT post AVR/aortic root replacement. The various methods of treatment have been discussed with the patient and family. After consideration of risks, benefits and other options for treatment, the patient has consented to  Procedure(s): Left Heart Cath and Cors/Grafts Angiography (N/A) as a surgical intervention .  The patient's history has been reviewed, patient examined, no change in status, stable for surgery.  I have reviewed the patient's chart and labs.  Questions were answered to the patient's satisfaction.    Cath Lab Visit (complete for each Cath Lab visit)  Clinical Evaluation Leading to the Procedure:   ACS: Yes.    Non-ACS:    Anginal Classification: CCS III   Anti-ischemic medical therapy: Minimal Therapy (1 class of medications)  Non-Invasive Test Results: No non-invasive testing performed  Prior CABG: Previous CABG         Lauree Chandler

## 2016-10-07 NOTE — Progress Notes (Addendum)
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  No further episodes of VT/VF overnight.  No chest pain, SOB  Objective: Vital signs: BP Readings from Last 1 Encounters:  10/07/16 97/73   Pulse Readings from Last 1 Encounters:  10/07/16 69   Resp Readings from Last 1 Encounters:  10/07/16 (!) 22   Temp Readings from Last 1 Encounters:  10/07/16 97 F (36.1 C) (Core (Comment))    Hemodynamics:   Mixed venous co-ox 65%   Physical Exam:  Rhythm:   VVI paced @ 70 - sinus w/ pacer off but quickly has R on T with salvo VT  Breath sounds: clear  Heart sounds:  RRR w/out murmur  Incisions:  Clean and dry  Abdomen:  Soft, non-distended, non-tender  Extremities:  Warm, well-perfused    Intake/Output from previous day: 06/26 0701 - 06/27 0700 In: 1787.9 [P.O.:360; I.V.:757.9; IV Piggyback:550] Out: 8338 [Urine:1205; Chest Tube:65] Intake/Output this shift: No intake/output data recorded.  Lab Results:  CBC: Recent Labs  10/06/16 0249 10/06/16 1259 10/07/16 0444  WBC 10.2  --  8.1  HGB 9.6* 9.2* 9.2*  HCT 30.2* 27.0* 29.2*  PLT 114*  --  155    BMET:  Recent Labs  10/06/16 1615 10/07/16 0444  NA 134* 134*  K 5.3* 3.1*  CL 94* 95*  CO2 29 31  GLUCOSE 175* 151*  BUN 40* 39*  CREATININE 1.97* 1.74*  CALCIUM 8.0* 8.0*     PT/INR:  No results for input(s): LABPROT, INR in the  last 72 hours.  CBG (last 3)   Recent Labs  10/05/16 1207 10/05/16 1618 10/05/16 2018  GLUCAP 110* 97 147*    ABG    Component Value Date/Time   PHART 7.499 (H) 10/07/2016 0457   PCO2ART 44.4 10/07/2016 0457   PO2ART 105.0 10/07/2016 0457   HCO3 34.6 (H) 10/07/2016 0457   TCO2 36 10/07/2016 0457   ACIDBASEDEF 1.0 10/02/2016 2329   O2SAT 65.1 10/07/2016 0500    CXR: PORTABLE CHEST 1 VIEW  COMPARISON:  10/07/2006.  FINDINGS: Removal of bilateral chest tubes, mediastinal drainage catheter, right IJ sheath. Right PICC line scratched in stable position . Prior CABG. Cardiomegaly. Mild bilateral pulmonary infiltrates consistent pulmonary edema noted. Low lung volumes with basilar atelectasis. Small left pleural effusion. Low lung volumes with basilar atelectasis. No pneumothorax.  IMPRESSION: 1. Interim removal of bilateral chest tubes, mediastinal drainage catheter, right IJ sheath Right PICC line stable position.  2. Prior CABG. Persistent cardiomegaly. Mild bilateral pulmonary mild infiltrates are noted consistent with pulmonary edema. Small left-sided pleural effusion .  3. Low lung volumes with basilar atelectasis.   Electronically Signed   By: Marcello Moores  Register   On: 10/07/2016 07:53    Transthoracic Echocardiography  Patient:    Rowland, Ericsson MR #:       [id number] Study Date: 10/06/2016 Gender:  M Age:        71 Height:     182.9 cm Weight:     101.7 kg BSA:        2.3 m^2 Pt. Status: Room:       [facility]   ORDERING     Darylene Price, M.D.  REFERRING    Darylene Price, M.D.  SONOGRAPHER  Sharion Dove, [place], [place], MD  PERFORMING   [facility], Inpatient  SONOGRAPHER  [facility]  cc:  ------------------------------------------------------------------- LV EF: 40% -   45%  ------------------------------------------------------------------- History:   PMH:  Elevated  troponin.  Risk factors:  S/P Aortic root replacement.  ------------------------------------------------------------------- Study Conclusions  - Left ventricle: The cavity size was normal. Wall thickness was   increased in a pattern of moderate LVH. Systolic function was   mildly to moderately reduced. The estimated ejection fraction was   in the range of 40% to 45%. Diffuse hypokinesis with more severe   anteroseptal hypokinesis/dyskinesis. The study is not technically   sufficient to allow evaluation of LV diastolic function. - Aortic valve: Poorly visualized. There was mild regurgitation. - Aorta: The aortic root has been replaced - measures 3.9 cm. - Mitral valve: Mildly thickened leaflets . There was mild   regurgitation. - Left atrium: The atrium was normal in size. - Right atrium: The atrium was mildly dilated. - Tricuspid valve: There was trivial regurgitation. - Pulmonary arteries: PA peak pressure: 22 mm Hg (S). - Inferior vena cava: The vessel was normal in size. The   respirophasic diameter changes were in the normal range (>= 50%),   consistent with normal central venous pressure.  Impressions:  - Compared to a prior study on 09/28/2016, the LVEF is lower at   40-45% with global hypokinesis and more severe   anteroseptal/septal hypokinesis/dyskinesis.  ------------------------------------------------------------------- Study data:  The previous study was not available, so comparison was made to the report of 09/28/2016.  Study status:  Routine. Procedure:  Transthoracic echocardiography. Image quality was adequate.  Study completion:  There were no complications. Transthoracic echocardiography.  M-mode, complete 2D, spectral Doppler, and color Doppler.  Birthdate:  Patient birthdate: 06/06/1945.  Age:  Patient is 71 yr old.  Sex:  Gender: male. BMI: 30.4 kg/m^2.  Blood pressure:     95/64  Patient status: Inpatient.  Study date:  Study date: 10/06/2016. Study  time: 08:16 AM.  Location:  Bedside.  -------------------------------------------------------------------  ------------------------------------------------------------------- Left ventricle:  The cavity size was normal. Wall thickness was increased in a pattern of moderate LVH. Systolic function was mildly to moderately reduced. The estimated ejection fraction was in the range of 40% to 45%. Diffuse hypokinesis with more severe anteroseptal hypokinesis/dyskinesis. The study is not technically sufficient to allow evaluation of LV diastolic function.  ------------------------------------------------------------------- Aortic valve:  Poorly visualized.  Doppler:  There was mild regurgitation.  ------------------------------------------------------------------- Aorta:  The aortic root has been replaced - measures 3.9 cm.  ------------------------------------------------------------------- Mitral valve:   Mildly thickened leaflets .  Doppler:  There was mild regurgitation.    Peak gradient (D): 4 mm Hg.  ------------------------------------------------------------------- Left atrium:  The atrium was normal in size.  ------------------------------------------------------------------- Atrial septum:  Poorly visualized.  ------------------------------------------------------------------- Right ventricle:  Poorly visualized.  ------------------------------------------------------------------- Pulmonic valve:    The valve appears to be grossly normal. Doppler:  There was no significant regurgitation.  ------------------------------------------------------------------- Tricuspid valve:   Doppler:  There was trivial regurgitation.  -------------------------------------------------------------------  Pulmonary artery:   The main pulmonary artery was normal-sized.  ------------------------------------------------------------------- Right atrium:  The atrium was mildly  dilated.  ------------------------------------------------------------------- Pericardium:  There was no pericardial effusion.  ------------------------------------------------------------------- Systemic veins: Inferior vena cava: The vessel was normal in size. The respirophasic diameter changes were in the normal range (>= 50%), consistent with normal central venous pressure.  ------------------------------------------------------------------- Post procedure conclusions Ascending Aorta:  - The aortic root has been replaced - measures 3.9 cm.  ------------------------------------------------------------------- Measurements   Left ventricle                           Value        Reference  LV ID, ED, PLAX chordal                  44    mm     43 - 52  LV ID, ES, PLAX chordal          (H)     39    mm     23 - 38  LV fx shortening, PLAX chordal   (L)     11    %      >=29  LV PW thickness, ED                      13    mm     ----------  IVS/LV PW ratio, ED                      1.08         <=1.3  Stroke volume, 2D                        78    ml     ----------  Stroke volume/bsa, 2D                    34    ml/m^2 ----------  LV ejection fraction, 1-p A4C            38    %      ----------  LV end-diastolic volume, 2-p             193   ml     ----------  LV end-systolic volume, 2-p              108   ml     ----------  LV ejection fraction, 2-p                44    %      ----------  Stroke volume, 2-p                       85    ml     ----------  LV end-diastolic volume/bsa, 2-p         84    ml/m^2 ----------  LV end-systolic volume/bsa, 2-p          47    ml/m^2 ----------  Stroke volume/bsa, 2-p                   37    ml/m^2 ----------  LV e&', lateral                           5.66  cm/s   ----------  LV E/e&', lateral                         18.2         ----------  LV e&', medial                            4.68  cm/s   ----------  LV E/e&', medial                           22.01        ----------  LV e&', average                           5.17  cm/s   ----------  LV E/e&', average                         19.92        ----------    Ventricular septum                       Value        Reference  IVS thickness, ED                        14    mm     ----------    LVOT                                     Value        Reference  LVOT ID, S                               22    mm     ----------  LVOT area                                3.8   cm^2   ----------  LVOT peak velocity, S                    111   cm/s   ----------  LVOT mean velocity, S                    80.8  cm/s   ----------  LVOT VTI, S                              20.4  cm     ----------  LVOT peak gradient, S                    5     mm Hg  ----------    Aortic valve                             Value        Reference  Aortic regurg pressure half-time         555   ms     ----------    Aorta  Value        Reference  Aortic root ID, ED                       39    mm     ----------    Left atrium                              Value        Reference  LA ID, A-P, ES                           36    mm     ----------  LA ID/bsa, A-P                           1.57  cm/m^2 <=2.2  LA volume, S                             43    ml     ----------  LA volume/bsa, S                         18.7  ml/m^2 ----------  LA volume, ES, 1-p A4C                   40.2  ml     ----------  LA volume/bsa, ES, 1-p A4C               17.5  ml/m^2 ----------  LA volume, ES, 1-p A2C                   45.8  ml     ----------  LA volume/bsa, ES, 1-p A2C               19.9  ml/m^2 ----------    Mitral valve                             Value        Reference  Mitral E-wave peak velocity              103   cm/s   ----------  Mitral A-wave peak velocity              86.9  cm/s   ----------  Mitral deceleration time                 190   ms     150 - 230  Mitral peak gradient, D                   4     mm Hg  ----------  Mitral E/A ratio, peak                   1.2          ----------    Pulmonary arteries                       Value        Reference  PA pressure, S, DP  22    mm Hg  <=30    Tricuspid valve                          Value        Reference  Tricuspid regurg peak velocity           217   cm/s   ----------  Tricuspid peak RV-RA gradient            19    mm Hg  ----------    Right atrium                             Value        Reference  RA ID, S-I, ES, A4C              (H)     59.8  mm     34 - 49  RA area, ES, A4C                 (H)     19.6  cm^2   8.3 - 19.5  RA volume, ES, A/L                       51.8  ml     ----------  RA volume/bsa, ES, A/L                   22.5  ml/m^2 ----------    Systemic veins                           Value        Reference  Estimated CVP                            3     mm Hg  ----------    Right ventricle                          Value        Reference  TAPSE                                    12.4  mm     ----------  RV pressure, S, DP                       22    mm Hg  <=30  RV s&', lateral, S                        7.4   cm/s   ----------  Legend: (L)  and  (H)  mark values outside specified reference range.  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  Stable overnight w/ no further  episodes VT/VF on  Vpacing @ 70 but substrate persists w/ nearly immediate salvo VT w/ pacer turned down Clinical scenario not c/w coronary ischemia although EF down slightly on ECHO and troponins positive, both of which are not surprising Discussed w/ Dr Rayann Heman - will continue VVI overdrive pacing and both IV amiodarone and lidocaine for now Will ask for cath today to be certain there is no proximal coronary insufficiency related to surgery Keep on bedrest in ICU for now Supplement potassium Watch renal function Low dose lovenox for DVT prophylaxis Continue Vanc + Rocephin - will need f/u from ID team eventually   [name], MD 10/07/2016 8:06 AM

## 2016-10-07 NOTE — Progress Notes (Signed)
Electrophysiology Rounding Note  Patient Name: Charles [name] Date of Encounter: 10/07/2016    Subjective   No Vt overnight.  Denies CP or SOB currently.  Inpatient Medications    Scheduled Meds: . acetaminophen  1,000 mg Oral Q6H  . aspirin EC  325 mg Oral Daily  . bisacodyl  10 mg Oral Daily   Or  . bisacodyl  10 mg Rectal Daily  . Chlorhexidine Gluconate Cloth  6 each Topical Daily  . docusate sodium  200 mg Oral Daily  . enoxaparin (LOVENOX) injection  30 mg Subcutaneous Q24H  . mouth rinse  15 mL Mouth Rinse BID  . metoprolol tartrate  25 mg Oral Q6H  . pantoprazole  40 mg Oral Daily  . sodium chloride flush  10-40 mL Intracatheter Q12H  . sodium chloride flush  3 mL Intravenous Q12H   Continuous Infusions: . amiodarone 30 mg/hr (10/07/16 1100)  . cefTRIAXone (ROCEPHIN)  IV Stopped (10/06/16 2041)  . lidocaine 1 mg/min (10/07/16 1100)  . potassium chloride 10 mEq (10/07/16 0743)  . potassium chloride 10 mEq (10/07/16 1047)  . vancomycin Stopped (10/07/16 1032)   PRN Meds: metoprolol tartrate, morphine injection, ondansetron (ZOFRAN) IV, oxyCODONE, potassium chloride, sodium chloride flush, sodium chloride flush, traMADol   Vital Signs    Vitals:   10/07/16 0800 10/07/16 0900 10/07/16 1000 10/07/16 1100  BP: 98/67 103/70 106/72 103/69  Pulse: 69 70 69 70  Resp: 18 (!) 21 (!) 21 (!) 24  Temp:      TempSrc:      SpO2: 99% 98% 100% 99%  Weight:      Height:        Intake/Output Summary (Last 24 hours) at 10/07/16 1126 Last data filed at 10/07/16 1100  Gross per 24 hour  Intake           1582.7 ml  Output             1310 ml  Net            272.7 ml   Filed Weights   10/05/16 0600 10/06/16 0430 10/07/16 0500  Weight: 231 lb 0.7 oz (104.8 kg) 224 lb 3.3 oz (101.7 kg) 222 lb 14.4 oz (101.1 kg)    Physical Exam    GEN- The patient is ill appearing, alert and oriented x 3 today.   Head- normocephalic, atraumatic Eyes-  Sclera clear, conjunctiva  pink Ears- hearing intact Oropharynx- clear Neck- supple Lungs- Clear to ausculation bilaterally, normal work of breathing Heart- Regular rate and rhythm  GI- soft, NT, ND, + BS Extremities- no clubbing, cyanosis, or edema Skin- no rash or lesion Psych- euthymic mood, full affect Neuro- strength and sensation are intact  Labs    CBC  Recent Labs  10/06/16 0249 10/06/16 1259 10/07/16 0444  WBC 10.2  --  8.1  HGB 9.6* 9.2* 9.2*  HCT 30.2* 27.0* 29.2*  MCV 82.1  --  83.2  PLT 114*  --  700   Basic Metabolic Panel  Recent Labs  10/06/16 0249  10/06/16 1615 10/07/16 0444  NA 136  < > 134* 134*  K 3.2*  < > 5.3* 3.1*  CL 94*  --  94* 95*  CO2 32  --  29 31  GLUCOSE 105*  < > 175* 151*  BUN 40*  --  40* 39*  CREATININE 2.13*  --  1.97* 1.74*  CALCIUM 8.1*  --  8.0* 8.0*  MG 2.3  --   --  2.7*  < > = values in this interval not displayed. Liver Function Tests  Recent Labs  10/06/16 0249 10/07/16 0444  AST 16 13*  ALT 15* 16*  ALKPHOS 67 75  BILITOT 0.6 0.4  PROT 5.3* 5.2*  ALBUMIN 2.5* 2.2*   No results for input(s): LIPASE, AMYLASE in the last 72 hours. Cardiac Enzymes  Recent Labs  10/06/16 0556 10/06/16 1124 10/06/16 1615  TROPONINI 4.36* 2.89* 3.66*    Telemetry    V paced, with cessation of VT pacing, nonsustained PMVT returned (personally reviewed)   Patient Profile     Charles [name] is a 71 y.o. male with a past medical history significant for endocarditis.  He is having VT s/p AVR.   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  Interestingly, atrial pacing was not as effective. I am not convinced that amiodarone or lidocaine are very effective as episodes clearly resume with termination of V pacing today.   Would not make any changes at this time.  If no further VT, would stop lidocaine tomorrow and begin conversion of amiodarone to oral.  Would continue V pacing for at  least 48 hours and then reassess.  Hopefully as he continues to recover from his surgery, ventricular ectopy will improve. Once ectopy is resolved, would consider lifevest at discharge.  I am not enthusiastic about device implantation given his prior endocarditis.  Hopefully as he recovers from surgery, his PMVT will prove to be from a reversible cause.  2. S/p AVR I spoke with Dr Roxy Manns this am.  Will plan cath today to assess ostia of the cors to make sure that ischemia is not a cause for VT.  EP to follow closely Dr Lovena Le to see tomorrow and Friday as I will be away  Signed, Thompson Grayer, MD  10/07/2016, 11:26 AM

## 2016-10-07 NOTE — Progress Notes (Signed)
PT Cancellation Note  Patient Details Name: Charles [name] MRN: [id number] DOB: [birth year]   Cancelled Treatment:    Reason Eval/Treat Not Completed: Medical issues which prohibited therapy Hold today per RN. PT will continue to follow acutely.    Salina April, PTA Pager: [phone]   10/07/2016, 9:14 AM

## 2016-10-07 NOTE — Plan of Care (Signed)
Problem: Bowel/Gastric: Goal: Gastrointestinal status for postoperative course will improve Outcome: Progressing Oral meds given. Passing gas.

## 2016-10-07 NOTE — CV Procedure (Signed)
Site area: Left femoral artery Site Prior to Removal:  level 0 Pressure Applied For: 20 Manual: yes Patient Status During Pull:  stable Post Pull Site:  level 0 Post Pull Instructions Given:  yes Post Pull Pulses Present: DP +2 Dressing Applied: gauze and tegaderm Bedrest begins @ 1835 Comments: n/a

## 2016-10-08 ENCOUNTER — Encounter (HOSPITAL_COMMUNITY): Payer: Self-pay | Admitting: Cardiovascular Disease

## 2016-10-08 LAB — COMPREHENSIVE METABOLIC PANEL
ALBUMIN: 2.1 g/dL — AB (ref 3.5–5.0)
ALK PHOS: 82 U/L (ref 38–126)
ALT: 14 U/L — ABNORMAL LOW (ref 17–63)
ANION GAP: 10 (ref 5–15)
AST: 13 U/L — ABNORMAL LOW (ref 15–41)
BILIRUBIN TOTAL: 0.3 mg/dL (ref 0.3–1.2)
BUN: 31 mg/dL — ABNORMAL HIGH (ref 6–20)
CO2: 27 mmol/L (ref 22–32)
Calcium: 7.8 mg/dL — ABNORMAL LOW (ref 8.9–10.3)
Chloride: 98 mmol/L — ABNORMAL LOW (ref 101–111)
Creatinine, Ser: 1.44 mg/dL — ABNORMAL HIGH (ref 0.61–1.24)
GFR calc non Af Amer: 48 mL/min — ABNORMAL LOW (ref >60)
GFR, EST AFRICAN AMERICAN: 56 mL/min — AB (ref >60)
GLUCOSE: 121 mg/dL — AB (ref 65–99)
Potassium: 3.8 mmol/L (ref 3.5–5.1)
Sodium: 135 mmol/L (ref 135–145)
Total Protein: 5 g/dL — ABNORMAL LOW (ref 6.5–8.1)

## 2016-10-08 LAB — CBC
HCT: 29 % — ABNORMAL LOW (ref 39.0–52.0)
HEMOGLOBIN: 8.9 g/dL — AB (ref 13.0–17.0)
MCH: 25.5 pg — ABNORMAL LOW (ref 26.0–34.0)
MCHC: 30.7 g/dL (ref 30.0–36.0)
MCV: 83.1 fL (ref 78.0–100.0)
Platelets: 189 10*3/uL (ref 150–400)
RBC: 3.49 MIL/uL — ABNORMAL LOW (ref 4.22–5.81)
RDW: 15.8 % — ABNORMAL HIGH (ref 11.5–15.5)
WBC: 8.2 10*3/uL (ref 4.0–10.5)

## 2016-10-08 LAB — COOXEMETRY PANEL
Carboxyhemoglobin: 1 % (ref 0.5–1.5)
Methemoglobin: 1.3 % (ref 0.0–1.5)
O2 SAT: 65.4 %
TOTAL HEMOGLOBIN: 9.1 g/dL — AB (ref 12.0–16.0)

## 2016-10-08 LAB — POTASSIUM: POTASSIUM: 4.1 mmol/L (ref 3.5–5.1)

## 2016-10-08 MED ORDER — LIDOCAINE BOLUS VIA INFUSION
50.0000 mg | Freq: Once | INTRAVENOUS | Status: AC
Start: 2016-10-08 — End: 2016-10-08
  Administered 2016-10-08: 50 mg via INTRAVENOUS
  Filled 2016-10-08: qty 52

## 2016-10-08 MED ORDER — FUROSEMIDE 40 MG PO TABS
40.0000 mg | ORAL_TABLET | Freq: Every day | ORAL | Status: DC
  Administered 2016-10-08 – 2016-10-15 (×8): 40 mg via ORAL
  Filled 2016-10-08 (×8): qty 1

## 2016-10-08 MED ORDER — MAGNESIUM HYDROXIDE 400 MG/5ML PO SUSP: 30.0000 mL | Freq: Every day | ORAL | Status: DC | PRN

## 2016-10-08 MED ORDER — SODIUM CHLORIDE 0.9 % IV SOLN: 250.0000 mL | INTRAVENOUS | Status: DC | PRN

## 2016-10-08 MED ORDER — LOSARTAN POTASSIUM-HCTZ 50-12.5 MG PO TABS: 1.0000 | ORAL_TABLET | Freq: Every day | ORAL | Status: DC

## 2016-10-08 MED ORDER — LIDOCAINE IN D5W 4-5 MG/ML-% IV SOLN
2.0000 mg/min | INTRAVENOUS | Status: DC
  Filled 2016-10-08: qty 500

## 2016-10-08 MED ORDER — ROSUVASTATIN CALCIUM 10 MG PO TABS
10.0000 mg | ORAL_TABLET | Freq: Every day | ORAL | Status: DC
  Administered 2016-10-08 – 2016-10-15 (×8): 10 mg via ORAL
  Filled 2016-10-08 (×8): qty 1

## 2016-10-08 MED ORDER — POTASSIUM CHLORIDE 10 MEQ/50ML IV SOLN
10.0000 meq | INTRAVENOUS | Status: AC
  Administered 2016-10-08 (×4): 10 meq via INTRAVENOUS
  Filled 2016-10-08 (×2): qty 50

## 2016-10-08 MED ORDER — SODIUM CHLORIDE 0.9% FLUSH: 3.0000 mL | INTRAVENOUS | Status: DC | PRN

## 2016-10-08 MED ORDER — FA-PYRIDOXINE-CYANOCOBALAMIN 2.5-25-2 MG PO TABS
1.0000 | ORAL_TABLET | Freq: Every day | ORAL | Status: DC
  Administered 2016-10-08 – 2016-10-14 (×7): 1 via ORAL
  Filled 2016-10-08 (×8): qty 1

## 2016-10-08 MED ORDER — SODIUM CHLORIDE 0.9% FLUSH
3.0000 mL | Freq: Two times a day (BID) | INTRAVENOUS | Status: DC
  Administered 2016-10-08 – 2016-10-11 (×5): 3 mL via INTRAVENOUS

## 2016-10-08 MED ORDER — FUROSEMIDE 40 MG PO TABS: 40.0000 mg | ORAL_TABLET | Freq: Every day | ORAL | Status: DC

## 2016-10-08 MED ORDER — ASPIRIN EC 325 MG PO TBEC
325.0000 mg | DELAYED_RELEASE_TABLET | Freq: Every day | ORAL | Status: DC
Start: 2016-10-08 — End: 2016-10-15
  Administered 2016-10-08 – 2016-10-15 (×8): 325 mg via ORAL
  Filled 2016-10-08 (×8): qty 1

## 2016-10-08 MED ORDER — AMIODARONE IV BOLUS ONLY 150 MG/100ML
150.0000 mg | Freq: Once | INTRAVENOUS | Status: AC
  Administered 2016-10-08: 150 mg via INTRAVENOUS

## 2016-10-08 MED ORDER — POTASSIUM CHLORIDE CRYS ER 20 MEQ PO TBCR
20.0000 meq | EXTENDED_RELEASE_TABLET | Freq: Two times a day (BID) | ORAL | Status: DC
  Administered 2016-10-08 – 2016-10-15 (×15): 20 meq via ORAL
  Filled 2016-10-08 (×15): qty 1

## 2016-10-08 MED ORDER — METOPROLOL TARTRATE 5 MG/5ML IV SOLN
5.0000 mg | Freq: Once | INTRAVENOUS | Status: AC
  Administered 2016-10-08: 5 mg via INTRAVENOUS

## 2016-10-08 MED ORDER — MOVING RIGHT ALONG BOOK
Freq: Once | Status: AC
  Administered 2016-10-08: 08:00:00
  Filled 2016-10-08: qty 1

## 2016-10-08 MED ORDER — FERROUS SULFATE 325 (65 FE) MG PO TABS
325.0000 mg | ORAL_TABLET | Freq: Every day | ORAL | Status: DC
Start: 2016-10-08 — End: 2016-10-15
  Administered 2016-10-08 – 2016-10-12 (×5): 325 mg via ORAL
  Filled 2016-10-08 (×7): qty 1

## 2016-10-08 MED FILL — Verapamil HCl IV Soln 2.5 MG/ML: INTRAVENOUS | Qty: 2 | Status: AC

## 2016-10-08 NOTE — Progress Notes (Signed)
Patient went into sustained VT for approximately 10 seconds at 1058 AM and converted back into ventricular paced rhythm while defibrillator was charging. Charlcie Cradle PA was contacted. 150 mg amiodarone bolus and 5mg  metoprolol IV push was ordered and administered. Will continue to monitor patient closely.  Sydnee Cabal. Lovena Le  RN

## 2016-10-08 NOTE — Progress Notes (Signed)
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

## 2016-10-08 NOTE — Progress Notes (Signed)
Patient went into sustained VT for approximately 10-15 seconds at 1317 PM. Patient was shocked with 120 J one time and converted back to ventricular paced rhythm. Charlcie Cradle PA was contacted. 50 mg lidocaine bolus and 2mg /hr infusion ordered and administered. Will continue to monitor patient closely.  Sydnee Cabal. Lovena Le  RN

## 2016-10-08 NOTE — Progress Notes (Signed)
Electrophysiology Rounding Note  Patient Name: Charles [name] Date of Encounter: 10/08/2016    Subjective   Denies CP or SOB feels less anxious this morning given good day yesterday and good cath findings.  Asks about getting OOB  Inpatient Medications    Scheduled Meds: . aspirin EC  325 mg Oral Daily  . bisacodyl  10 mg Oral Daily   Or  . bisacodyl  10 mg Rectal Daily  . Chlorhexidine Gluconate Cloth  6 each Topical Daily  . docusate sodium  200 mg Oral Daily  . enoxaparin (LOVENOX) injection  30 mg Subcutaneous Q24H  . ferrous sulfate  325 mg Oral Daily  . folic acid-pyridoxine-cyancobalamin  1 tablet Oral Daily  . furosemide  40 mg Oral Daily  . mouth rinse  15 mL Mouth Rinse BID  . metoprolol tartrate  25 mg Oral Q6H  . pantoprazole  40 mg Oral Daily  . potassium chloride  20 mEq Oral BID  . rosuvastatin  10 mg Oral Daily  . sodium chloride flush  10-40 mL Intracatheter Q12H  . sodium chloride flush  3 mL Intravenous Q12H  . sodium chloride flush  3 mL Intravenous Q12H  . sodium chloride flush  3 mL Intravenous Q12H   Continuous Infusions: . sodium chloride    . sodium chloride    . amiodarone 30 mg/hr (10/08/16 0700)  . cefTRIAXone (ROCEPHIN)  IV Stopped (10/07/16 2024)  . lidocaine Stopped (10/08/16 0749)  . potassium chloride 10 mEq (10/08/16 0939)  . vancomycin Stopped (10/08/16 0933)   PRN Meds: sodium chloride, sodium chloride, magnesium hydroxide, metoprolol tartrate, morphine injection, ondansetron (ZOFRAN) IV, oxyCODONE, sodium chloride flush, sodium chloride flush, sodium chloride flush, sodium chloride flush, traMADol   Vital Signs    Vitals:   10/08/16 0500 10/08/16 0600 10/08/16 0700 10/08/16 0831  BP: 97/75 99/74 92/66    Pulse: 70 70 71   Resp: (!) 21 17 (!) 25   Temp:    97.9 F (36.6 C)  TempSrc:    Oral  SpO2: 93% 96% 97%   Weight: 229 lb 0.9 oz (103.9 kg)     Height:        Intake/Output Summary (Last 24 hours) at 10/08/16  0943 Last data filed at 10/08/16 0940  Gross per 24 hour  Intake          1657.83 ml  Output             1445 ml  Net           212.83 ml   Filed Weights   10/06/16 0430 10/07/16 0500 10/08/16 0500  Weight: 224 lb 3.3 oz (101.7 kg) 222 lb 14.4 oz (101.1 kg) 229 lb 0.9 oz (103.9 kg)    Physical Exam    GEN- The patient is ill appearing, alert and oriented x 3 today.   Head- normocephalic, atraumatic Eyes-  Sclera clear, conjunctiva pink Ears- hearing intact Oropharynx- clear Neck- supple Lungs- CTA b/l, normal work of breathing Heart- Regular rate and rhythm  GI- soft, NT, ND Extremities- no clubbing, cyanosis, or edema Skin- no rash or lesion Psych- euthymic mood, full affect Neuro- strength and sensation are intact  Labs    CBC  Recent Labs  10/07/16 0444 10/08/16 0448  WBC 8.1 8.2  HGB 9.2* 8.9*  HCT 29.2* 29.0*  MCV 83.2 83.1  PLT 155 376   Basic Metabolic Panel  Recent Labs  10/06/16 0249  10/07/16 0444 10/07/16 1917 10/08/16  0448  NA 136  < > 134* 133* 135  K 3.2*  < > 3.1* 3.5 3.8  CL 94*  < > 95* 97* 98*  CO2 32  < > 31 29 27   GLUCOSE 105*  < > 151* 102* 121*  BUN 40*  < > 39* 32* 31*  CREATININE 2.13*  < > 1.74* 1.38* 1.44*  CALCIUM 8.1*  < > 8.0* 7.7* 7.8*  MG 2.3  --  2.7*  --   --   < > = values in this interval not displayed. Liver Function Tests  Recent Labs  10/07/16 0444 10/08/16 0448  AST 13* 13*  ALT 16* 14*  ALKPHOS 75 82  BILITOT 0.4 0.3  PROT 5.2* 5.0*  ALBUMIN 2.2* 2.1*   No results for input(s): LIPASE, AMYLASE in the last 72 hours. Cardiac Enzymes  Recent Labs  10/06/16 0556 10/06/16 1124 10/06/16 1615  TROPONINI 4.36* 2.89* 3.66*    Telemetry    V paced, nonsustained PMVT returned (personally reviewed)   Patient Profile     Charles [name] is a 71 y.o. male with a past medical history significant for endocarditis.  He is having VT s/p AVR.   Assessment & Plan    1. Polymorphic VT Begins with PVC on  T wave Brief self limited episodes continue though appears with decreasing frequency.  Dr. Rayann [name] noted interestingly, atrial pacing was not as effective. Dr. Rayann [name] recommended yesterday to continue V pacing for at least 48 hours and then reassess.  Will maintain V pacing today Cath without new finding SVG working well  The patient is seen today with Dr. Lovena [name], Will stop lidocaine today with plans to PO amiodarone tomorrow if rhythm remains stable, continue metoprolol Would hold off ambulation today Hopefully as he continues to recover from his surgery, ventricular ectopy will improve. Pending clinical course, discussion yesterday was to consider life vest at discharge rather then ICD given endocarditis   2. 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 Agree with above. His exam reveals a RRR, lungs - clear with basilar rales. His VT has quieted down with ventricular pacing and amio and lidocaine. His exam does not show decompensated heart failure. We will try and stop lidocaine, and eventually transition to oral amio if his VT remains quiet. If it returns will restart IV lidocaine and consider transition to [place]. Continue RV pacing. Long term he will need a life vest before an ICD.   Charles [name], M.D.

## 2016-10-08 NOTE — Progress Notes (Signed)
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  Had a quiet night.  No pain, SOB.  No episodes VT/VF  Objective: Vital signs: BP Readings from Last 1 Encounters:  10/08/16 92/66   Pulse Readings from Last 1 Encounters:  10/08/16 71   Resp Readings from Last 1 Encounters:  10/08/16 (!) 25   Temp Readings from Last 1 Encounters:  10/08/16 98 F (36.7 C) (Axillary)    Hemodynamics:    Physical Exam:  Rhythm:   Sinus brady 50's - VVI overdrive pacing  Breath sounds: clear  Heart sounds:  RRR w/out murmur  Incisions:  Clean and dry  Abdomen:  Soft, non-distended, non-tender  Extremities:  Warm, well-perfused    Intake/Output from previous day: 06/27 0701 - 06/28 0700 In: 1768.2 [P.O.:240; I.V.:1228.2; IV Piggyback:300] Out: 1295 [Urine:1295] Intake/Output this shift: No intake/output data recorded.  Lab Results:  CBC: Recent Labs  10/07/16 0444 10/08/16 0448  WBC 8.1 8.2  HGB 9.2* 8.9*  HCT 29.2* 29.0*  PLT 155 189    BMET:  Recent Labs  10/07/16 1917 10/08/16 0448  NA 133* 135  K 3.5 3.8  CL 97* 98*  CO2 29 27  GLUCOSE 102* 121*  BUN 32* 31*  CREATININE 1.38* 1.44*  CALCIUM 7.7* 7.8*     PT/INR:  No results for input(s): LABPROT, INR in the last 72 hours.  CBG (last 3)   Recent  Labs  10/05/16 1207 10/05/16 1618 10/05/16 2018  GLUCAP 110* 97 147*    ABG    Component Value Date/Time   PHART 7.499 (H) 10/07/2016 0457   PCO2ART 44.4 10/07/2016 0457   PO2ART 105.0 10/07/2016 0457   HCO3 34.6 (H) 10/07/2016 0457   TCO2 36 10/07/2016 0457   ACIDBASEDEF 1.0 10/02/2016 2329   O2SAT 65.4 10/08/2016 0450    CXR: N/a  EKG: Sinus brady w/ 1st degree AV block, no ischemic changes    CARDIAC CATHETERIZATION  Left Heart Cath and Cors/Grafts Angiography  Conclusion     Prox Cx to Mid Cx lesion, 30 %stenosed.  Mid LAD lesion, 20 %stenosed.  Ost 2nd Diag lesion, 20 %stenosed.  Dist LAD lesion, 40 %stenosed.  Ost RCA lesion, 100 %stenosed.  SVG graft was visualized by angiography and is very large and anatomically normal.   1. 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 Patent SVG to PDA. The entire RCA fills from the patent vein graft.  3. Mild disease in the Circumflex and LAD. No change since last cath.  4. Normal LVEDP  Recommendations: No further ischemic workup      Assessment/Plan:  Doing much better w/ no further episodes of recurrent VT/VF  Currently maintaining VVI overdrive pacing w/ sinus bradycardia underneath BP stable and breathing  comfortably w/ O2 sats 73% Acute diastolic CHF with expected post-op volume excess, resolved - now looks essentially euvolemic CKD stage III with resolving AKI likely due to prerenal azotemia, creatinine approaching baseline Expected post op acute blood loss anemia with preexisting iron-deficient anemia, stable Expected post op atelectasis, mild Hypokalemia, induced by loop diuretics 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

## 2016-10-08 NOTE — Progress Notes (Signed)
Physical Therapy Treatment Patient Details Name: Charles [name] MRN: [id number] DOB: 10-18-1945 Today's Date: 10/08/2016    History of Present Illness Patient is a 71 year old moderately obese white male with known history of heart murmur, who was recently hospitalized for Streptococcus viridans subacute bacterial endocarditis.  Patient underwent aortic root replacement, CABG x 2 and repair of PFO on 10/02/16.    PT Comments    Pt tolerated OOB transfer to recliner with mod +2 assist. Pt with c/o dizziness upon sitting. VSS. RN present. PT will continue to follow for mobility progression.   Follow Up Recommendations  CIR     Equipment Recommendations  Rolling walker with 5" wheels    Recommendations for Other Services Rehab consult     Precautions / Restrictions Precautions Precautions: Fall;Sternal    Mobility  Bed Mobility Overal bed mobility: Needs Assistance Bed Mobility: Supine to Sit     Supine to sit: Mod assist;+2 for physical assistance;HOB elevated     General bed mobility comments: assist with bilat LE and trunk to come into sitting; use of bed pad; pt hugging pillow; cues for sternal precautions; pt c/o dizziness with sitting initially; vitals WNL  Transfers Overall transfer level: Needs assistance Equipment used:  (2 person face to face with use of bed pad) Transfers: Sit to/from [facility] Sit to Stand: Mod assist;+2 physical assistance Stand pivot transfers: Mod assist;+2 physical assistance       General transfer comment: assist to power up with use of bed pad to bring bottom from EOB; cues for posture/forward gaze, and sternal precuations; pt able to take pivotal steps to chair with assist for balance, posture, and weight shifting  Ambulation/Gait                 Stairs            Wheelchair Mobility    Modified Rankin (Stroke Patients Only)       Balance Overall balance assessment: Needs  assistance Sitting-balance support: Feet supported Sitting balance-Leahy Scale: Fair       Standing balance-Leahy Scale: Poor                              Cognition Arousal/Alertness: Awake/alert Behavior During Therapy: WFL for tasks assessed/performed Overall Cognitive Status: Within Functional Limits for tasks assessed                                        Exercises      General Comments General comments (skin integrity, edema, etc.): VSS throughout      Pertinent Vitals/Pain Pain Assessment: Faces Faces Pain Scale: Hurts little more Pain Location: generalized Pain Descriptors / Indicators: Discomfort;Sore Pain Intervention(s): Monitored during session;Repositioned    Home Living                      Prior Function            PT Goals (current goals can now be found in the care plan section) Progress towards PT goals: Progressing toward goals    Frequency    Min 3X/week      PT Plan Current plan remains appropriate    Co-evaluation              AM-PAC PT "6 Clicks" Daily Activity  Outcome Measure  Difficulty turning over  in bed (including adjusting bedclothes, sheets and blankets)?: Total Difficulty moving from lying on back to sitting on the side of the bed? : Total Difficulty sitting down on and standing up from a chair with arms (e.g., wheelchair, bedside commode, etc,.)?: Total Help needed moving to and from a bed to chair (including a wheelchair)?: A Lot Help needed walking in hospital room?: A Lot Help needed climbing 3-5 steps with a railing? : Total 6 Click Score: 8    End of Session Equipment Utilized During Treatment: Gait belt;Oxygen Activity Tolerance: Patient tolerated treatment well Patient left: in chair;with call bell/phone within reach;with chair alarm set;with family/visitor present Nurse Communication: Mobility status PT Visit Diagnosis: Muscle weakness (generalized)  (M62.81);Difficulty in walking, not elsewhere classified (R26.2)     Time: 4008-6761 PT Time Calculation (min) (ACUTE ONLY): 29 min  Charges:  $Therapeutic Activity: 23-37 mins                    G Codes:       Earney Navy, PTA Pager: [phone]     Darliss Cheney 10/08/2016, 12:56 PM

## 2016-10-09 DIAGNOSIS — Z95828 Presence of other vascular implants and grafts: Secondary | ICD-10-CM

## 2016-10-09 DIAGNOSIS — N183 Chronic kidney disease, stage 3 (moderate): Secondary | ICD-10-CM

## 2016-10-09 LAB — CBC
HEMATOCRIT: 27.1 % — AB (ref 39.0–52.0)
Hemoglobin: 8.4 g/dL — ABNORMAL LOW (ref 13.0–17.0)
MCH: 25.8 pg — ABNORMAL LOW (ref 26.0–34.0)
MCHC: 31 g/dL (ref 30.0–36.0)
MCV: 83.1 fL (ref 78.0–100.0)
PLATELETS: 214 10*3/uL (ref 150–400)
RBC: 3.26 MIL/uL — ABNORMAL LOW (ref 4.22–5.81)
RDW: 15.8 % — AB (ref 11.5–15.5)
WBC: 7.5 10*3/uL (ref 4.0–10.5)

## 2016-10-09 LAB — BASIC METABOLIC PANEL
Anion gap: 8 (ref 5–15)
BUN: 27 mg/dL — AB (ref 6–20)
CALCIUM: 7.6 mg/dL — AB (ref 8.9–10.3)
CO2: 27 mmol/L (ref 22–32)
CREATININE: 1.38 mg/dL — AB (ref 0.61–1.24)
Chloride: 99 mmol/L — ABNORMAL LOW (ref 101–111)
GFR calc Af Amer: 59 mL/min — ABNORMAL LOW (ref >60)
GFR, EST NON AFRICAN AMERICAN: 51 mL/min — AB (ref >60)
GLUCOSE: 131 mg/dL — AB (ref 65–99)
Potassium: 3.7 mmol/L (ref 3.5–5.1)
Sodium: 134 mmol/L — ABNORMAL LOW (ref 135–145)

## 2016-10-09 LAB — VANCOMYCIN, TROUGH: Vancomycin Tr: 9 ug/mL — ABNORMAL LOW (ref 15–20)

## 2016-10-09 MED ORDER — POTASSIUM CHLORIDE 10 MEQ/50ML IV SOLN
10.0000 meq | INTRAVENOUS | Status: AC
  Administered 2016-10-09 (×3): 10 meq via INTRAVENOUS
  Filled 2016-10-09 (×3): qty 50

## 2016-10-09 MED ORDER — VANCOMYCIN HCL IN DEXTROSE 1-5 GM/200ML-% IV SOLN
1000.0000 mg | INTRAVENOUS | Status: DC
  Filled 2016-10-09: qty 200

## 2016-10-09 MED ORDER — MEXILETINE HCL 200 MG PO CAPS
200.0000 mg | ORAL_CAPSULE | Freq: Three times a day (TID) | ORAL | Status: DC
  Administered 2016-10-09 – 2016-10-15 (×19): 200 mg via ORAL
  Filled 2016-10-09 (×19): qty 1

## 2016-10-09 MED ORDER — SODIUM CHLORIDE 0.9 % IV SOLN
3.0000 g | Freq: Four times a day (QID) | INTRAVENOUS | Status: DC
  Administered 2016-10-09 – 2016-10-15 (×25): 3 g via INTRAVENOUS
  Filled 2016-10-09 (×29): qty 3

## 2016-10-09 NOTE — Evaluation (Signed)
Occupational Therapy Evaluation Patient Details Name: Charles [name] MRN: [id number] DOB: [birth year] Today's Date: 10/09/2016    History of Present Illness Patient is a 71 year old moderately obese white male with known history of heart murmur, who was recently hospitalized for Streptococcus viridans subacute bacterial endocarditis.  Patient underwent aortic root replacement, CABG x 2 and repair of PFO on 10/02/16.   Clinical Impression   Pt admitted with above. He demonstrates the below listed deficits and will benefit from continued OT to maximize safety and independence with BADLs.  Pt presents to OT with generalized weakness, decreased activity tolerance, impaired balance.  RN present during eval as pt was paced and hooked to defibrillator.  He was able to ambulate ~6ft with +3 assist for safety.  VSS throughout session.   He requires mod A, overall for ADLs.  He was very independent PTA  Feel he would benefit from CIR.       Follow Up Recommendations  CIR    Equipment Recommendations  3 in 1 bedside commode    Recommendations for Other Services Rehab consult     Precautions / Restrictions Precautions Precautions: Fall;Sternal      Mobility Bed Mobility               General bed mobility comments: Pt sitting up in chair   Transfers Overall transfer level: Needs assistance Equipment used: Rolling walker (2 wheeled) Transfers: Sit to/from Stand;Stand Pivot Transfers Sit to Stand: Min assist;+2 safety/equipment Stand pivot transfers: Min assist;+2 safety/equipment       General transfer comment: Pt required cues for technique and assist to lift hips     Balance Overall balance assessment: Needs assistance Sitting-balance support: Feet supported Sitting balance-Leahy Scale: Fair     Standing balance support: Bilateral upper extremity supported Standing balance-Leahy Scale: Poor Standing balance comment: reliant on UE support                             ADL either performed or assessed with clinical judgement   ADL Overall ADL's : Needs assistance/impaired Eating/Feeding: Independent;Sitting   Grooming: Wash/dry hands;Wash/dry face;Oral care;Brushing hair;Set up;Sitting   Upper Body Bathing: Moderate assistance;Sitting   Lower Body Bathing: Maximal assistance;Sit to/from stand   Upper Body Dressing : Maximal assistance;Sitting (due to lines )   Lower Body Dressing: Total assistance;Sit to/from stand   Toilet Transfer: Minimal assistance;+2 for safety/equipment;Stand-pivot;Ambulation;Comfort height toilet;BSC;Grab bars;RW   Toileting- Clothing Manipulation and Hygiene: Maximal assistance;Sit to/from stand       Functional mobility during ADLs: Minimal assistance;+2 for physical assistance;Rolling walker (3rd person Investment banker, corporate) to Loss adjuster, chartered ) General ADL Comments: Pt requires frequent rest breaks      Vision         Perception     Praxis      Pertinent Vitals/Pain Pain Assessment: Faces Faces Pain Scale: Hurts little more Pain Location: generalized Pain Descriptors / Indicators: Discomfort;Sore Pain Intervention(s): Monitored during session     Hand Dominance Right   Extremity/Trunk Assessment Upper Extremity Assessment Upper Extremity Assessment: Generalized weakness   Lower Extremity Assessment Lower Extremity Assessment: Defer to PT evaluation   Cervical / Trunk Assessment Cervical / Trunk Assessment: Kyphotic   Communication Communication Communication: No difficulties   Cognition Arousal/Alertness: Awake/alert Behavior During Therapy: WFL for tasks assessed/performed Overall Cognitive Status: Within Functional Limits for tasks assessed  General Comments  RN present throughout session.  Pt ambulated ~85ft    Exercises     Shoulder Instructions      Home Living Family/patient expects to be discharged to:: Inpatient rehab Living  Arrangements: Spouse/significant other Available Help at Discharge: Family Type of Home: House Home Access: Level entry Entrance Stairs-Number of Steps: inclined   Home Layout: One level     Bathroom Shower/Tub: Occupational psychologist: Handicapped height     Home Equipment: Shower seat - built in          Prior Functioning/Environment Level of Independence: Independent        Comments: Pt reports he volunteered frequently         OT Problem List: Decreased strength;Decreased activity tolerance;Impaired balance (sitting and/or standing);Decreased knowledge of use of DME or AE;Decreased knowledge of precautions;Cardiopulmonary status limiting activity      OT Treatment/Interventions: Self-care/ADL training;Energy conservation;DME and/or AE instruction;Therapeutic activities;Patient/family education;Balance training    OT Goals(Current goals can be found in the care plan section) Acute Rehab OT Goals Patient Stated Goal: To shovel gravel to complete his driveway  OT Goal Formulation: With patient Time For Goal Achievement: 10/23/16 Potential to Achieve Goals: Good ADL Goals Pt Will Perform Grooming: with min guard assist;standing Pt Will Perform Upper Body Bathing: with set-up;sitting Pt Will Perform Lower Body Bathing: with min assist;sit to/from stand;with adaptive equipment Pt Will Perform Upper Body Dressing: with set-up;sitting Pt Will Perform Lower Body Dressing: with min guard assist;sit to/from stand;with adaptive equipment Pt Will Transfer to Toilet: with min guard assist;ambulating;regular height toilet;bedside commode;grab bars Pt Will Perform Toileting - Clothing Manipulation and hygiene: with min guard assist;sit to/from stand  OT Frequency: Min 2X/week   Barriers to D/C:            Co-evaluation              AM-PAC PT "6 Clicks" Daily Activity     Outcome Measure Help from another person eating meals?: None Help from another person  taking care of personal grooming?: A Little Help from another person toileting, which includes using toliet, bedpan, or urinal?: A Lot Help from another person bathing (including washing, rinsing, drying)?: A Lot Help from another person to put on and taking off regular upper body clothing?: A Lot Help from another person to put on and taking off regular lower body clothing?: A Lot 6 Click Score: 15   End of Session Equipment Utilized During Treatment: Gait belt;Rolling walker Nurse Communication: Mobility status  Activity Tolerance: Patient tolerated treatment well Patient left: in bed;with call bell/phone within reach;with bed alarm set;with nursing/sitter in room  OT Visit Diagnosis: Unsteadiness on feet (R26.81)                Time: 5364-6803 OT Time Calculation (min): 38 min Charges:  OT General Charges $OT Visit: 1 Procedure OT Evaluation $OT Eval High Complexity: 1 Procedure OT Treatments $Therapeutic Activity: 23-37 mins G-Codes:     [facility], OTR/L [phone]   Etrulia Zarr M 10/09/2016, 5:14 PM

## 2016-10-09 NOTE — Progress Notes (Signed)
Pharmacy Antibiotic Note Charles [name] is a 71 y.o. male  with AV valve vegetation s/p CABG and aortic root replacement on 6/22. Pharmacy asked to change vancomycin and ceftriaxone to Unasyn today.  Planning Unasyn x 6 weeks.  (Use surgery date of 6/22 as start date??)  Plan: Unasyn 3g IV q 6 hrs.  Will f/u renal function, length of therapy.   Height: 6' (182.9 cm) Weight: 229 lb 8 oz (104.1 kg) IBW/kg (Calculated) : 77.6  Temp (24hrs), Avg:97.9 F (36.6 C), Min:97.3 F (36.3 C), Max:98.2 F (36.8 C)   Recent Labs Lab 10/05/16 0407 10/05/16 2325 10/06/16 0249 10/06/16 1615 10/07/16 0444 10/07/16 1917 10/08/16 0448 10/09/16 0329 10/09/16 0637  WBC 13.5*  --  10.2  --  8.1  --  8.2 7.5  --   CREATININE 1.70*  --  2.13* 1.97* 1.74* 1.38* 1.44* 1.38*  --   VANCOTROUGH  --  24*  --   --   --   --   --   --  9*    Estimated Creatinine Clearance: 63 mL/min (A) (by C-G formula based on SCr of 1.38 mg/dL (H)).    Allergies  Allergen Reactions  . No Known Allergies     Antimicrobials this admission:  Unasyn 6/15 >>6/18 Ceftriaxone 6/18 >> 6/29 Vancomycin 6/18 >> 6/29 Unasyn 6/29 >  Dose adjustments this admission:  -- 6/21 VT = 28 on 750 mg q12 hours and SCr 1.4 > changed to 1g q24 -- 6/25 VT = 24 on 1g q24 > change to 750 mg q24 (Scr bumped a little around this time) -- 6/29 VT = 9 on 750 mg IV q 24 hrs - change back to 1g q 24 hr  Microbiology results:  6/14 Blood x 2: ngF 6/18 Blood x 1: neg 6/19 MRSA PCR: negative  6/19 UCx: neg 6/22 Aortic valve tissue: mixed anaerobic flora  Uvaldo Rising, BCPS  Clinical Pharmacist Pager [phone]  10/09/2016 10:24 AM

## 2016-10-09 NOTE — Progress Notes (Addendum)
[facility]    Date of Admission:  09/24/2016   Total days of antibiotics 7        Day 7 vanco/ceftriaxone           ID: Charles [name] is a 71 y.o. 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 Post operatively he was placed on vancomycin plus ceftriaxone. Post op course complicated by recurrent polymorphic VT/VF and sinus brady s/p external pacer  Principal Problem:   S/P aortic root replacement with human allograft + CABG x1 Active Problems:   Ascending aortic aneurysm (HCC)   NSTEMI (non-ST elevated myocardial infarction) (HCC)   Bicuspid aortic valve   Aortic insufficiency   Acute diastolic CHF (congestive heart failure), NYHA class 4 (HCC)   Insomnia   S/P CABG x 1   S/P patent foramen ovale closure   S/P aortic valve allograft   Ventricular tachycardia (HCC)   Subjective: afebrile  Medications:  . aspirin EC  325 mg Oral Daily  . bisacodyl  10 mg Oral Daily   Or  . bisacodyl  10 mg Rectal Daily  . Chlorhexidine Gluconate Cloth  6 each Topical Daily  . docusate sodium  200 mg Oral Daily  . enoxaparin (LOVENOX) injection  30 mg Subcutaneous Q24H  . ferrous sulfate  325 mg Oral Daily  . folic acid-pyridoxine-cyancobalamin  1 tablet Oral Daily  . furosemide  40 mg Oral Daily  . mouth rinse  15 mL Mouth Rinse BID  . metoprolol tartrate  25 mg Oral Q6H  . mexiletine  200 mg Oral Q8H  . pantoprazole  40 mg Oral Daily  . potassium chloride  20 mEq Oral BID  . rosuvastatin  10 mg Oral Daily  . sodium chloride flush  3 mL Intravenous Q12H    Objective: Vital signs in last 24 hours: Temp:  [97.3 F (36.3 C)-98.2 F (36.8 C)] 98 F (36.7 C) (06/29 0807) Pulse Rate:  [68-82] 79 (06/29  0900) Resp:  [11-28] 13 (06/29 0900) BP: (92-146)/(55-93) 92/74 (06/29 0807) SpO2:  [92 %-100 %] 95 % (06/29 0900) Arterial Line BP: (93-169)/(52-79) 115/60 (06/29 0900) Weight:  [229 lb 8 oz (104.1 kg)] 229 lb 8 oz (104.1 kg) (06/29 0500)  Physical Exam  Constitutional: He is oriented to person, place, and time. He appears well-developed and well-nourished. No distress. Sitting in chair eating lunch HENT:  Mouth/Throat: Oropharynx is clear and moist. No oropharyngeal exudate.  Cardiovascular: distant heart sounds. Normal rate, regular rhythm and normal heart sounds. Exam reveals no gallop and no friction rub.  No murmur heard.  Pulmonary/Chest: Effort normal and breath sounds normal. No respiratory distress. He has no wheezes.  Chest wall = incision is healing well, no surrounding drainage or erythema Ext: trace edema on LE bilaterally. RUE picc line is c/d/i Neurological: He is alert and oriented to person, place, and time.  Skin: Skin is warm and dry. No rash noted. No erythema.  Psychiatric: He has a normal mood and affect. His behavior is normal.      Lab Results  Recent Labs  10/08/16 0448 10/08/16 1753 10/09/16 0329  WBC 8.2  --  7.5  HGB 8.9*  --  8.4*  HCT 29.0*  --  27.1*  NA 135  --  134*  K 3.8 4.1 3.7  CL 98*  --  99*  CO2 27  --  27  BUN 31*  --  27*  CREATININE 1.44*  --  1.38*   Liver Panel  Recent Labs  10/07/16 0444 10/08/16 0448  PROT 5.2* 5.0*  ALBUMIN 2.2* 2.1*  AST 13* 13*  ALT 16* 14*  ALKPHOS 75 82  BILITOT 0.4 0.3    Microbiology: 6/22 tissue cx = mixed anaerobes 6/18 blood cx = NGTD 6/14 blood cx = NGTD  3/22 blood cx = strep viridans  Studies/Results: No results found.  ASSESSMENT: Polymicrobial, including anaerobic pathogens, SBE s/p AVR, aortic root replacement, CABG x 1   Now on vanco and ceftriaxone.   PLAN:  - recommend 6 wk of unasyn via right arm picc line, using 6/29 as day 1 of 42 - will d/c vanco and  ceftriaxone - I spoke to micro who states that the tissue is no longer available for further testing  - ckd 3 = will renally adjust amp/sub for renal function  Intermittent VT/VF = defer to cardiology if pt meets criteria for need of defib/pacer, appears to be starting mexilitine. Anticipate if he needs ICD then it would be deferred til he is finished with his IV abtx, and may need temp lifevest in the interim.  Dr Megan Salon to see on Monday. Dr Tommy Medal available for questions over the weekend. ----------------------------------------------------------------------------   Baxter Flattery [facility] Cell: [phone] Pager: [phone]  10/09/2016, 9:46 AM

## 2016-10-09 NOTE — Progress Notes (Signed)
Pharmacy Antibiotic Note Charles [name] is a 71 y.o. male  with AV valve vegetation s/p CABG and aortic root replacement on 6/22. Vancomycin trough this morning is subtherapeutic.  Plan: Change Vancomycin to 1g q 24 hrs. Will f/u renal function, length of therapy.   Height: 6' (182.9 cm) Weight: 229 lb 8 oz (104.1 kg) IBW/kg (Calculated) : 77.6  Temp (24hrs), Avg:97.9 F (36.6 C), Min:97.3 F (36.3 C), Max:98.2 F (36.8 C)   Recent Labs Lab 10/05/16 0407 10/05/16 2325 10/06/16 0249 10/06/16 1615 10/07/16 0444 10/07/16 1917 10/08/16 0448 10/09/16 0329 10/09/16 0637  WBC 13.5*  --  10.2  --  8.1  --  8.2 7.5  --   CREATININE 1.70*  --  2.13* 1.97* 1.74* 1.38* 1.44* 1.38*  --   VANCOTROUGH  --  24*  --   --   --   --   --   --  9*    Estimated Creatinine Clearance: 63 mL/min (A) (by C-G formula based on SCr of 1.38 mg/dL (H)).    Allergies  Allergen Reactions  . No Known Allergies     Antimicrobials this admission:  Unasyn 6/15 >>6/18 Ceftriaxone 6/18 >>  Vancomycin 6/18 >>   Dose adjustments this admission:  -- 6/21 VT = 28 on 750 mg q12 hours and SCr 1.4 > changed to 1g q24 -- 6/25 VT = 24 on 1g q24 > change to 750 mg q24 (Scr bumped a little around this time) -- 6/29 VT = 9 on 750 mg IV q 24 hrs - change back to 1g q 24 hr  Microbiology results:  6/14 Blood x 2: ngF 6/18 Blood x 1: neg 6/19 MRSA PCR: negative  6/19 UCx: neg 6/22 Aortic valve tissue: neg  Uvaldo Rising, BCPS  Clinical Pharmacist Pager [phone]  10/09/2016 8:36 AM

## 2016-10-09 NOTE — Progress Notes (Signed)
[address]       [place] [address]             [phone]        CARDIOTHORACIC SURGERY PROGRESS NOTE  R7Days Post-Op Procedure(s) (LRB): AORTIC ROOT REPLACEMENT - using 20mm Allograft (N/A) CORONARY ARTERY BYPASS GRAFTING (CABG) x one, using right leg greater saphenous vein harvested endoscopically (N/A) THORACIC ASCENDING ANEURYSM REPAIR - using 2mm Allograft; Reimplantation of Left Coronary Button (N/A) TRANSESOPHAGEAL ECHOCARDIOGRAM (TEE) (N/A) PATENT FORAMEN OVALE (PFO) CLOSURE (N/A) REPAIR FALSE ANEURYSM of Aortic Root Resection and Grafting of Aortic Root   R2 Days Post-Op Procedure(s) (LRB): Left Heart Cath and Cors/Grafts Angiography (N/A)  Subjective: Feels fine.  No pain, SOB.  Good appetite.  + bowel function  Objective: Vital signs: BP Readings from Last 1 Encounters:  10/09/16 92/74   Pulse Readings from Last 1 Encounters:  10/09/16 79   Resp Readings from Last 1 Encounters:  10/09/16 15   Temp Readings from Last 1 Encounters:  10/08/16 98.2 F (36.8 C) (Oral)    Hemodynamics:    Physical Exam:  Rhythm:   VVI paced  Breath sounds: clear  Heart sounds:  RRR w/out murmur  Incisions:  Clean and dry  Abdomen:  Soft, non-distended, non-tender  Extremities:  Warm, well-perfused    Intake/Output from previous day: 06/28 0701 - 06/29 0700 In: 2265.3 [P.O.:600; I.V.:1215.3; IV Piggyback:450] Out: 1680 [Urine:1680] Intake/Output this shift: No intake/output data recorded.  Lab Results:  CBC: Recent Labs  10/08/16 0448 10/09/16 0329  WBC 8.2 7.5  HGB 8.9* 8.4*  HCT 29.0* 27.1*  PLT 189 214    BMET:  Recent Labs  10/08/16 0448 10/08/16 1753 10/09/16 0329  NA 135  --  134*  K 3.8 4.1 3.7  CL 98*  --  99*  CO2 27  --  27  GLUCOSE 121*  --  131*  BUN 31*  --  27*  CREATININE 1.44*  --  1.38*  CALCIUM 7.8*  --  7.6*     PT/INR:  No results for input(s): LABPROT, INR in the last 72 hours.  CBG (last  3)  No results for input(s): GLUCAP in the last 72 hours.  ABG    Component Value Date/Time   PHART 7.499 (H) 10/07/2016 0457   PCO2ART 44.4 10/07/2016 0457   PO2ART 105.0 10/07/2016 0457   HCO3 34.6 (H) 10/07/2016 0457   TCO2 36 10/07/2016 0457   ACIDBASEDEF 1.0 10/02/2016 2329   O2SAT 65.4 10/08/2016 0450    CXR: n/a  Assessment/Plan:  Currently stable w/ no further episodes of recurrent polymorphic VT/VF overnight Currently maintaining VVI overdrive pacing w/ sinus bradycardia underneath BP stable and breathing comfortably w/ O2 sats 95% Acute on chronic diastolic CHF with expected post-op volume excess, essentially resolved - weight stable CKD stage III with resolving AKI likely due to prerenal azotemia, creatinine approaching baseline Expected post op acute blood loss anemia with preexisting iron-deficient anemia, stable Expected post op atelectasis, mild Hypokalemia, induced by loop diuretics 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

## 2016-10-09 NOTE — Consult Note (Signed)
Physical Medicine and Rehabilitation Consult  Reason for Consult: Debility Referring Physician: Dr. Roxy Manns   HPI: Charles [name] is a 71 y.o. male with history of SBE--completed antibiotic regimen 07/2016 , severe AVR, CAD who was admitted on 09/24/16 with dyspnea due to acute CHF and NSTEMI. History taken from chart review and patient. He was diuresed and started on IV heparin. Follow up blood cultures negative and he underwent CABG X 2 with aortic root replacement and closure of PFO on 6/22 by DR. Roxy Manns. Post op with  PEA arrest on 6/26 due to VT andr required lidocaine as well as amiodarone for recurrent VT. He underwent cardiac cath revealing normal LVEDP, no change since last cath and no plans for placement of AICD at this time.  Acute on chronic renal failure, acute on chronic CHF requiring diuresis as well as ABLA. Has been weaned off lidocaine with recurrent episode of polymorphic VT and to be transitioned to Mexiletine. EP feels that ectopy should resolve with healing and  plans for LifeVest at discharge.  Tissue cultures positive for mixed anaerobes and Dr. Baxter Flattery recommended 6 week course of Unasyn using 6/29 as day 1 of 42. Therapy ongoing and patient noted to be debilitated. CIR recommended for follow up therapy.    Review of Systems  Constitutional: Negative.   Cardiovascular: Negative.   Musculoskeletal: Negative.   Neurological: Negative.   All other systems reviewed and are negative.     Past Medical History:  Diagnosis Date  . Anemia   . Aortic insufficiency    a. 07/07/2016: TEE showing bicuspid aortic valve with severe eccentric AI   . Ascending aortic aneurysm (HCC)    4.4 cm by CT 06/2016  . 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 Bicuspid aortic valve   . Chronic periodontitis   . Coronary artery disease   . Pneumonia 1978  . S/P aortic root replacement with allograft 10/02/2016   25 mm human allograft aortic root graft with  reimplantation of left main coronary artery, repair of false aneurysms of aortic root x2 due to root abscess, and replacement of ascending thoracic aortic aneurysm  . 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 S/P patent foramen ovale closure 10/02/2016    Past Surgical History:  Procedure Laterality Date  . AORTIC VALVE REPLACEMENT N/A 10/02/2016   Procedure: AORTIC ROOT REPLACEMENT  - using 11mm Allograft;  Surgeon: [name], MD;  Location: [facility];  Service: Open Heart Surgery;  Laterality: N/A;  . CORONARY ARTERY BYPASS GRAFT N/A 10/02/2016   Procedure: CORONARY ARTERY BYPASS GRAFTING (CABG) x one, using right leg greater saphenous vein harvested endoscopically;  Surgeon: [name], MD;  Location: [facility];  Service: Open Heart Surgery;  Laterality: N/A;  . FALSE ANEURYSM REPAIR  10/02/2016   Procedure: REPAIR FALSE ANEURYSM of Aortic Root Resection and Grafting of Aortic Root;  Surgeon: [name], MD;  Location: [facility];  Service: Open Heart Surgery;;  . HERNIA REPAIR Left   . LEFT HEART CATH AND CORS/GRAFTS ANGIOGRAPHY N/A 10/07/2016   Procedure: Left Heart Cath and Cors/Grafts Angiography;  Surgeon: Burnell Blanks, MD;  Location: [facility];  Service: Cardiovascular;  Laterality: N/A;  . MULTIPLE EXTRACTIONS WITH ALVEOLOPLASTY N/A 08/13/2016   Procedure: Extraction of tooth #'s 2,4,14 with alveoloplasty;  Surgeon: Lenn Cal, DDS;  Location: [facility];  Service: Oral Surgery;  Laterality: N/A;  . PATENT FORAMEN OVALE(PFO) CLOSURE N/A 10/02/2016   Procedure: PATENT FORAMEN OVALE (PFO) CLOSURE;  Surgeon: [name], MD;  Location: [facility];  Service: Open Heart Surgery;  Laterality: N/A;  . RIGHT/LEFT HEART CATH AND CORONARY ANGIOGRAPHY N/A 08/03/2016   Procedure: Right/Left Heart Cath and Coronary Angiography;  Surgeon: Burnell Blanks, MD;  Location: [facility];  Service: Cardiovascular;  Laterality: N/A;  . TEE WITHOUT  CARDIOVERSION N/A 07/07/2016   Procedure: TRANSESOPHAGEAL ECHOCARDIOGRAM (TEE);  Surgeon: Larey Dresser, MD;  Location: [facility];  Service: Cardiovascular;  Laterality: N/A;  . TEE WITHOUT CARDIOVERSION N/A 10/02/2016   Procedure: TRANSESOPHAGEAL ECHOCARDIOGRAM (TEE);  Surgeon: [name], MD;  Location: [facility];  Service: Open Heart Surgery;  Laterality: N/A;  . THORACIC AORTIC ANEURYSM REPAIR N/A 10/02/2016   Procedure: THORACIC ASCENDING ANEURYSM REPAIR  - using 16mm Allograft;  Reimplantation of Left Coronary Button;  Surgeon: [name], MD;  Location: [facility];  Service: Open Heart Surgery;  Laterality: N/A;    Family History  Problem Relation Age of Onset  . Ovarian cancer Mother   . Heart disease Father 14       Died of coronary thrombosis.      Social History:  Married. Independent PTA. Per  reports that he quit smoking about 51 years ago. His smoking use included Cigarettes. He quit smokeless tobacco 30 years ago. He reports that he does not drink alcohol or use drugs.     Allergies  Allergen Reactions  . No Known Allergies     Medications Prior to Admission  Medication Sig Dispense Refill  . acetaminophen (TYLENOL) 325 MG tablet Take 650 mg by mouth every 6 (six) hours as needed for headache.    Marland Kitchen aspirin EC 81 MG tablet Take 81 mg by mouth daily.    . chlorhexidine (PERIDEX) 0.12 % solution Rinse with 15 mls twice daily for 30 seconds. Use after breakfast and at bedtime. Spit out excess. Do not swallow. 480 mL prn  . Ferrous Sulfate 27 MG TABS Take 27 mg by mouth daily.    . furosemide (LASIX) 40 MG tablet Take 1 tablet (40 mg total) by mouth daily. (Patient taking differently: Take 40 mg by mouth daily as needed for fluid. ) 30 tablet 2  . losartan-hydrochlorothiazide (HYZAAR) 50-12.5 MG tablet Take 1 tablet by mouth daily.  2  . Multiple Vitamin (MULTIVITAMIN WITH MINERALS) TABS tablet Take 1 tablet by mouth daily.    . nitroGLYCERIN (NITROSTAT) 0.4 MG SL  tablet Place 1 tablet (0.4 mg total) under the tongue every 5 (five) minutes as needed. 25 tablet 3  . rosuvastatin (CRESTOR) 10 MG tablet Take 1 tablet by mouth daily.  2    Home: Home Living Family/patient expects to be discharged to:: Inpatient rehab Living Arrangements: Spouse/significant other Available Help at Discharge: Family Type of Home: House Home Access: Level entry Entrance Stairs-Number of Steps: inclined Home Layout: One level Bathroom Shower/Tub: Multimedia programmer: Handicapped height Home Equipment: Civil engineer, contracting - built in  Functional History: Prior Function Level of Independence: Independent Comments: Pt reports he volunteered frequently  Functional Status:  Mobility: Bed Mobility Overal bed mobility: Needs Assistance Bed Mobility: Supine to Sit Supine to sit: Mod assist, +2 for physical assistance, HOB elevated Sit to supine: Max assist, +2 for physical assistance General bed mobility comments: Pt sitting up in chair  Transfers Overall transfer level: Needs assistance Equipment used: Rolling walker (2  wheeled) Transfers: Sit to/from Stand, Risk manager Sit to Stand: Min assist, +2 safety/equipment Stand pivot transfers: Min assist, +2 safety/equipment General transfer comment: Pt required cues for technique and assist to lift hips       ADL: ADL Overall ADL's : Needs assistance/impaired Eating/Feeding: Independent, Sitting Grooming: Wash/dry hands, Wash/dry face, Oral care, Brushing hair, Set up, Sitting Upper Body Bathing: Moderate assistance, Sitting Lower Body Bathing: Maximal assistance, Sit to/from stand Upper Body Dressing : Maximal assistance, Sitting (due to lines ) Lower Body Dressing: Total assistance, Sit to/from stand Toilet Transfer: Minimal assistance, +2 for safety/equipment, Stand-pivot, Ambulation, Comfort height toilet, BSC, Grab bars, RW Toileting- Clothing Manipulation and Hygiene: Maximal assistance, Sit  to/from stand Functional mobility during ADLs: Minimal assistance, +2 for physical assistance, Rolling walker (3rd person Investment banker, corporate) to Loss adjuster, chartered ) General ADL Comments: Pt requires frequent rest breaks   Cognition: Cognition Overall Cognitive Status: Within Functional Limits for tasks assessed Orientation Level: Oriented X4 Cognition Arousal/Alertness: Awake/alert Behavior During Therapy: WFL for tasks assessed/performed Overall Cognitive Status: Within Functional Limits for tasks assessed   Blood pressure (!) 85/72, pulse 80, temperature 98.5 F (36.9 C), temperature source Oral, resp. rate 18, height 6' (1.829 m), weight 104.1 kg (229 lb 8 oz), SpO2 97 %. Physical Exam  Vitals reviewed. Constitutional: He appears well-developed and well-nourished.  HENT:  Head: Normocephalic and atraumatic.  Eyes: EOM are normal. Right eye exhibits no discharge. Left eye exhibits no discharge.  Neck: Normal range of motion. Neck supple.  Cardiovascular: Normal rate and regular rhythm.   Respiratory: Effort normal and breath sounds normal.  +[place]  GI: Soft. Bowel sounds are normal.  Musculoskeletal: He exhibits edema. He exhibits no tenderness.  Neurological: He is alert.  Motor: 4/5 throughout  Skin: Skin is warm and dry.  Incision c/d/i  Psychiatric: He has a normal mood and affect. His behavior is normal.    Results for orders placed or performed during the hospital encounter of 09/24/16 (from the past 24 hour(s))  CBC     Status: Abnormal   Collection Time: 10/09/16  3:29 AM  Result Value Ref Range   WBC 7.5 4.0 - 10.5 K/uL   RBC 3.26 (L) 4.22 - 5.81 MIL/uL   Hemoglobin 8.4 (L) 13.0 - 17.0 g/dL   HCT 27.1 (L) 39.0 - 52.0 %   MCV 83.1 78.0 - 100.0 fL   MCH 25.8 (L) 26.0 - 34.0 pg   MCHC 31.0 30.0 - 36.0 g/dL   RDW 15.8 (H) 11.5 - 15.5 %   Platelets 214 150 - 400 K/uL  Basic metabolic panel     Status: Abnormal   Collection Time: 10/09/16  3:29 AM  Result Value Ref Range    Sodium 134 (L) 135 - 145 mmol/L   Potassium 3.7 3.5 - 5.1 mmol/L   Chloride 99 (L) 101 - 111 mmol/L   CO2 27 22 - 32 mmol/L   Glucose, Bld 131 (H) 65 - 99 mg/dL   BUN 27 (H) 6 - 20 mg/dL   Creatinine, Ser 1.38 (H) 0.61 - 1.24 mg/dL   Calcium 7.6 (L) 8.9 - 10.3 mg/dL   GFR calc non Af Amer 51 (L) >60 mL/min   GFR calc Af Amer 59 (L) >60 mL/min   Anion gap 8 5 - 15  Vancomycin, trough     Status: Abnormal   Collection Time: 10/09/16  6:37 AM  Result Value Ref Range   Vancomycin Tr 9 (L) 15 - 20  ug/mL   No results found.  Assessment/Plan: Diagnosis: Debility Labs and images independently reviewed.  Records reviewed and summated above.  1. Does the need for close, 24 hr/day medical supervision in concert with the patient's rehab needs make it unreasonable for this patient to be served in a less intensive setting? Yes  2. Co-Morbidities requiring supervision/potential complications: SBE--completed antibiotic regimen 07/2016 , severe AVR (Monitor in accordance with increased physical activity and avoid UE resistance excercises), CAD (cont meds), due to acute CHF and NSTEM (cont meds), Acute on chronic renal failure, acute on chronic CHF (see above), abdominal pain requiring diuresis, as well as ABLA (transfuse if necessary to ensure appropriate perfusion for increased activity tolerance), tachypnea (monitor RR and O2 Sats with increased physical exertion) 3. Due to safety, skin/wound care, medication administration, pain management and patient education, does the patient require 24 hr/day rehab nursing? Yes 4. Does the patient require coordinated care of a physician, rehab nurse, PT (1-2 hrs/day, 5 days/week) and OT (1-2 hrs/day, 5 days/week) to address physical and functional deficits in the context of the above medical diagnosis(es)? Yes Addressing deficits in the following areas: balance, endurance, locomotion, strength, dressing, toileting and psychosocial support 5. Can the patient actively  participate in an intensive therapy program of at least 3 hrs of therapy per day at least 5 days per week? Yes 6. The potential for patient to make measurable gains while on inpatient rehab is excellent 7. Anticipated functional outcomes upon discharge from inpatient rehab are supervision and min assist  with PT, supervision with OT,  with SLP. 8. Estimated rehab length of stay to reach the above functional goals is: 10-15 days. 9. Anticipated D/C setting: Home 10. Anticipated post D/C treatments: HH therapy and Home excercise program 11. Overall Rehab/Functional Prognosis: excellent  RECOMMENDATIONS: This patient's condition is appropriate for continued rehabilitative care in the following setting: CIR when medically stabe and able to tolerate 3 hours therapy/day. Patient has agreed to participate in recommended program. Yes Note that insurance prior authorization may be required for reimbursement for recommended care.  Comment: Rehab Admissions Coordinator to follow up.  Delice Lesch, MD, Mellody Drown 10/10/16 Bary Leriche, PA-C 10/09/2016/

## 2016-10-09 NOTE — Progress Notes (Signed)
Electrophysiology Rounding Note  Patient Name: Charles [name] Date of Encounter: 10/09/2016    Subjective   Denies CP or SOB   Inpatient Medications    Scheduled Meds: . aspirin EC  325 mg Oral Daily  . bisacodyl  10 mg Oral Daily   Or  . bisacodyl  10 mg Rectal Daily  . Chlorhexidine Gluconate Cloth  6 each Topical Daily  . docusate sodium  200 mg Oral Daily  . enoxaparin (LOVENOX) injection  30 mg Subcutaneous Q24H  . ferrous sulfate  325 mg Oral Daily  . folic acid-pyridoxine-cyancobalamin  1 tablet Oral Daily  . furosemide  40 mg Oral Daily  . mouth rinse  15 mL Mouth Rinse BID  . metoprolol tartrate  25 mg Oral Q6H  . pantoprazole  40 mg Oral Daily  . potassium chloride  20 mEq Oral BID  . rosuvastatin  10 mg Oral Daily  . sodium chloride flush  3 mL Intravenous Q12H   Continuous Infusions: . sodium chloride    . sodium chloride    . amiodarone 30 mg/hr (10/09/16 0800)  . cefTRIAXone (ROCEPHIN)  IV Stopped (10/08/16 2123)  . lidocaine 2 mg/min (10/09/16 0800)  . potassium chloride 10 mEq (10/09/16 0810)  . vancomycin     PRN Meds: sodium chloride, sodium chloride, magnesium hydroxide, metoprolol tartrate, morphine injection, ondansetron (ZOFRAN) IV, oxyCODONE, sodium chloride flush, sodium chloride flush, traMADol   Vital Signs    Vitals:   10/09/16 0700 10/09/16 0715 10/09/16 0800 10/09/16 0807  BP: 92/74   92/74  Pulse: 79 79 81 79  Resp: 15 15 (!) 23 20  Temp:    98 F (36.7 C)  TempSrc:    Oral  SpO2: 95% 95% 92% 95%  Weight:      Height:        Intake/Output Summary (Last 24 hours) at 10/09/16 0854 Last data filed at 10/09/16 0810  Gross per 24 hour  Intake          2299.75 ml  Output             1780 ml  Net           519.75 ml   Filed Weights   10/07/16 0500 10/08/16 0500 10/09/16 0500  Weight: 222 lb 14.4 oz (101.1 kg) 229 lb 0.9 oz (103.9 kg) 229 lb 8 oz (104.1 kg)    Physical Exam    GEN- The patient is ill appearing, alert and  oriented x 3 today.   Head- normocephalic, atraumatic Eyes-  Sclera clear, conjunctiva pink Ears- hearing intact Oropharynx- clear Neck- supple Lungs- CTA b/l, normal work of breathing Heart- Regular rate and rhythm  GI- soft, NT, ND Extremities- no clubbing, cyanosis, or edema Skin- no rash or lesion Psych- euthymic mood, full affect Neuro- strength and sensation are intact  Labs    CBC  Recent Labs  10/08/16 0448 10/09/16 0329  WBC 8.2 7.5  HGB 8.9* 8.4*  HCT 29.0* 27.1*  MCV 83.1 83.1  PLT 189 696   Basic Metabolic Panel  Recent Labs  10/07/16 0444  10/08/16 0448 10/08/16 1753 10/09/16 0329  NA 134*  < > 135  --  134*  K 3.1*  < > 3.8 4.1 3.7  CL 95*  < > 98*  --  99*  CO2 31  < > 27  --  27  GLUCOSE 151*  < > 121*  --  131*  BUN 39*  < >  31*  --  27*  CREATININE 1.74*  < > 1.44*  --  1.38*  CALCIUM 8.0*  < > 7.8*  --  7.6*  MG 2.7*  --   --   --   --   < > = values in this interval not displayed. Liver Function Tests  Recent Labs  10/07/16 0444 10/08/16 0448  AST 13* 13*  ALT 16* 14*  ALKPHOS 75 82  BILITOT 0.4 0.3  PROT 5.2* 5.0*  ALBUMIN 2.2* 2.1*   No results for input(s): LIPASE, AMYLASE in the last 72 hours. Cardiac Enzymes  Recent Labs  10/06/16 1124 10/06/16 1615  TROPONINI 2.89* 3.66*    Telemetry    V paced,  PMVT returned yesterday   Patient Profile     CASSIAN TORELLI is a 71 y.o. male with a past medical history significant for endocarditis.  He is having VT s/p AVR.   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 Rayann Heman noted interestingly, atrial pacing was not as effective in arrhythmia supression Will maintain V pacing today Cath without new finding SVG working well  He had recurrent PMVT yesterday x2, one broke while defib was charging and another that required defibrillation, lidocaine was restarted and has been quite since, V pacing  I will d/w Dr. Lovena Le, likely to change lidocaine  to Mexiletine today, and tomorrow amiodarone to PO if rhythm remains stable, continue betablockaide  Hopefully as he continues to recover from his surgery, ventricular ectopy will improve. Pending clinical course, discussion yesterday was to consider life vest at discharge rather then ICD given endocarditis   2. 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 Agree with above. The patient has settled own since going back on IV lidocaine. His heart cath was negative. He remains on IV amiodarone. I would suggest change the IV lidocaine to mexilitine and if stable switch IV amio to oral amio tomorrow.   Mikle Bosworth.D.

## 2016-10-10 ENCOUNTER — Inpatient Hospital Stay (HOSPITAL_COMMUNITY): Payer: Medicare Other

## 2016-10-10 DIAGNOSIS — R0682 Tachypnea, not elsewhere classified: Secondary | ICD-10-CM

## 2016-10-10 DIAGNOSIS — I5043 Acute on chronic combined systolic (congestive) and diastolic (congestive) heart failure: Secondary | ICD-10-CM

## 2016-10-10 DIAGNOSIS — I509 Heart failure, unspecified: Secondary | ICD-10-CM

## 2016-10-10 DIAGNOSIS — Z951 Presence of aortocoronary bypass graft: Secondary | ICD-10-CM

## 2016-10-10 DIAGNOSIS — Z952 Presence of prosthetic heart valve: Secondary | ICD-10-CM

## 2016-10-10 DIAGNOSIS — D62 Acute posthemorrhagic anemia: Secondary | ICD-10-CM

## 2016-10-10 LAB — BASIC METABOLIC PANEL
ANION GAP: 7 (ref 5–15)
BUN: 23 mg/dL — ABNORMAL HIGH (ref 6–20)
CHLORIDE: 102 mmol/L (ref 101–111)
CO2: 27 mmol/L (ref 22–32)
CREATININE: 1.3 mg/dL — AB (ref 0.61–1.24)
Calcium: 7.8 mg/dL — ABNORMAL LOW (ref 8.9–10.3)
GFR calc non Af Amer: 54 mL/min — ABNORMAL LOW (ref >60)
Glucose, Bld: 99 mg/dL (ref 65–99)
POTASSIUM: 3.9 mmol/L (ref 3.5–5.1)
SODIUM: 136 mmol/L (ref 135–145)

## 2016-10-10 MED ORDER — METOPROLOL TARTRATE 25 MG PO TABS
25.0000 mg | ORAL_TABLET | Freq: Three times a day (TID) | ORAL | Status: DC
  Administered 2016-10-10 – 2016-10-14 (×10): 25 mg via ORAL
  Filled 2016-10-10 (×10): qty 1

## 2016-10-10 MED ORDER — AMIODARONE HCL 200 MG PO TABS
400.0000 mg | ORAL_TABLET | Freq: Three times a day (TID) | ORAL | Status: DC
  Administered 2016-10-10 – 2016-10-12 (×6): 400 mg via ORAL
  Filled 2016-10-10 (×7): qty 2

## 2016-10-10 NOTE — Progress Notes (Signed)
3 Days Post-Op Procedure(s) (LRB): Left Heart Cath and Cors/Grafts Angiography (N/A) Subjective:  No complaints.  Ambulated well Spent 7 hrs up in chair  No further VT overnight or today  Objective: Vital signs in last 24 hours: Temp:  [98 F (36.7 C)-98.5 F (36.9 C)] 98 F (36.7 C) (06/30 1216) Pulse Rate:  [75-84] 80 (06/30 1424) Cardiac Rhythm: Ventricular paced (06/30 0800) Resp:  [15-26] 25 (06/30 1400) BP: (78-113)/(57-88) 108/85 (06/30 1424) SpO2:  [92 %-100 %] 92 % (06/30 1400) FiO2 (%):  [3 %] 3 % (06/30 0600) Weight:  [104.7 kg (230 lb 13.2 oz)] 104.7 kg (230 lb 13.2 oz) (06/30 0600)  Hemodynamic parameters for last 24 hours:    Intake/Output from previous day: 06/29 0701 - 06/30 0700 In: 2057.5 [P.O.:1200; I.V.:507.5; IV Piggyback:350] Out: 1610 [Urine:1200; Stool:1] Intake/Output this shift: Total I/O In: 578.7 [P.O.:360; I.V.:48.7; Other:70; IV Piggyback:100] Out: 650 [Urine:650]  General appearance: alert and cooperative Neurologic: intact Heart: regular rate and rhythm, S1, S2 normal, no murmur, click, rub or gallop Lungs: diminished breath sounds bibasilar Extremities: edema mild in legs Wound: incisions ok  Lab Results:  Recent Labs  10/08/16 0448 10/09/16 0329  WBC 8.2 7.5  HGB 8.9* 8.4*  HCT 29.0* 27.1*  PLT 189 214   BMET:  Recent Labs  10/09/16 0329 10/10/16 0551  NA 134* 136  K 3.7 3.9  CL 99* 102  CO2 27 27  GLUCOSE 131* 99  BUN 27* 23*  CREATININE 1.38* 1.30*  CALCIUM 7.6* 7.8*    PT/INR: No results for input(s): LABPROT, INR in the last 72 hours. ABG    Component Value Date/Time   PHART 7.499 (H) 10/07/2016 0457   HCO3 34.6 (H) 10/07/2016 0457   TCO2 36 10/07/2016 0457   ACIDBASEDEF 1.0 10/02/2016 2329   O2SAT 65.4 10/08/2016 0450   CBG (last 3)  No results for input(s): GLUCAP in the last 72 hours.  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    Postop recurrent polymorphic VT/VF. Now on Mexilitine and amio without recurrence overnight. Will turn off VVI pacer as recommended by EP. Underlying rhythm is sinus 65.  Operative cultures from OR growing mixed anaerobes. ID has rec 6 wk of Unasyn.       LOS: 16 days    Charles [name] 10/10/2016

## 2016-10-10 NOTE — Plan of Care (Signed)
Problem: Activity: Goal: Risk for activity intolerance will decrease Outcome: Progressing Patient sat up in chair for 7 hours today. Ambulated with staff in hallway and walked 210 feet with walker.   Problem: Bowel/Gastric: Goal: Gastrointestinal status for postoperative course will improve Outcome: Progressing Patient had BM 6/29. Passing flatulence. Tolerating meals.   Problem: Cardiac: Goal: Ability to maintain an adequate cardiac output will improve Outcome: Progressing Pacemaker turned off. BP tolerating patients native rhythm = SR/SB 50-60s.  Problem: Nutritional: Goal: Risk for body nutrition deficit will decrease Outcome: Progressing Patient eating adequate amounts of food during meals, has had BM, no c/o nausea/vomiting. Passing gas.   Problem: Respiratory: Goal: Levels of oxygenation will improve Outcome: Progressing Patient is now on RA and saturating mid 90s. Goal: Respiratory status will improve Outcome: Progressing Patient pulling 1250-1500 on IS and doing exercises independently.  Problem: Pain Management: Goal: Pain level will decrease Outcome: Completed/Met Date Met: 10/10/16 No c/o pain. No PRN medications administered.

## 2016-10-10 NOTE — Progress Notes (Signed)
Electrophysiology Rounding Note  Patient Name: Charles [name] Date of Encounter: 10/10/2016    Subjective   The patient denies chest pain, shortness of breath, nocturnal dyspnea, orthopnea or peripheral edema.  There have been no palpitations, lightheadedness or syncope.    Inpatient Medications    Scheduled Meds: . aspirin EC  325 mg Oral Daily  . bisacodyl  10 mg Oral Daily   Or  . bisacodyl  10 mg Rectal Daily  . Chlorhexidine Gluconate Cloth  6 each Topical Daily  . docusate sodium  200 mg Oral Daily  . enoxaparin (LOVENOX) injection  30 mg Subcutaneous Q24H  . ferrous sulfate  325 mg Oral Daily  . folic acid-pyridoxine-cyancobalamin  1 tablet Oral Daily  . furosemide  40 mg Oral Daily  . mouth rinse  15 mL Mouth Rinse BID  . metoprolol tartrate  25 mg Oral Q6H  . mexiletine  200 mg Oral Q8H  . pantoprazole  40 mg Oral Daily  . potassium chloride  20 mEq Oral BID  . rosuvastatin  10 mg Oral Daily  . sodium chloride flush  3 mL Intravenous Q12H   Continuous Infusions: . sodium chloride    . sodium chloride    . amiodarone 30 mg/hr (10/10/16 0600)  . ampicillin-sulbactam (UNASYN) IV 3 g (10/10/16 0441)   PRN Meds: sodium chloride, sodium chloride, magnesium hydroxide, metoprolol tartrate, morphine injection, ondansetron (ZOFRAN) IV, oxyCODONE, sodium chloride flush, sodium chloride flush, traMADol   Vital Signs    Vitals:   10/10/16 0300 10/10/16 0400 10/10/16 0500 10/10/16 0600  BP: 99/73 94/69 93/67  (!) 86/66  Pulse: 79 80 79 79  Resp: (!) 21 (!) 22 (!) 21 (!) 22  Temp:  98.5 F (36.9 C)    TempSrc:  Oral    SpO2: 95% 95% 96% 95%  Weight:    230 lb 13.2 oz (104.7 kg)  Height:        Intake/Output Summary (Last 24 hours) at 10/10/16 0657 Last data filed at 10/10/16 0600  Gross per 24 hour  Intake           1903.8 ml  Output             1201 ml  Net            702.8 ml   Filed Weights   10/08/16 0500 10/09/16 0500 10/10/16 0600  Weight: 229 lb  0.9 oz (103.9 kg) 229 lb 8 oz (104.1 kg) 230 lb 13.2 oz (104.7 kg)    Physical Exam    Well developed and nourished in no acute distress HENT normal Neck supple with JVP-flat Carotids brisk and full without bruits Clear Regular rate and rhythm, no murmurs or gallops Abd-soft with active BS without hepatomegaly No Clubbing cyanosis edema Skin-warm and dry A & Oriented  Grossly normal sensory and motor function    Labs    CBC  Recent Labs  10/08/16 0448 10/09/16 0329  WBC 8.2 7.5  HGB 8.9* 8.4*  HCT 29.0* 27.1*  MCV 83.1 83.1  PLT 189 989   Basic Metabolic Panel  Recent Labs  10/08/16 0448 10/08/16 1753 10/09/16 0329  NA 135  --  134*  K 3.8 4.1 3.7  CL 98*  --  99*  CO2 27  --  27  GLUCOSE 121*  --  131*  BUN 31*  --  27*  CREATININE 1.44*  --  1.38*  CALCIUM 7.8*  --  7.6*   Liver  Function Tests  Recent Labs  10/08/16 0448  AST 13*  ALT 14*  ALKPHOS 82  BILITOT 0.3  PROT 5.0*  ALBUMIN 2.1*   No results for input(s): LIPASE, AMYLASE in the last 72 hours. Cardiac Enzymes No results for input(s): CKTOTAL, CKMB, CKMBINDEX, TROPONINI in the last 72 hours.  Telemetry    V paced,  PMVT returned yesterday   Patient Profile     Charles [name] is a 71 y.o. male with a past medical history significant for endocarditis.  He is having VT s/p AVR.  Interval Cath >> no acute occlusion   Assessment & Plan    1. Polymorphic VT/F Begins with PVC on T wave  Hypotension  May be 2/2 to v pacing   S/p AVR/CABG  LV dysfunction- mild 40-45%<<<55%   Sinus bradycardia    Pt amazingly positive in the midst of all of this  Continue amio and transition to PO today  Reduce metoprolol as some degree of bradycardia   Would stop pacing and see how he does with sinus

## 2016-10-10 NOTE — Progress Notes (Signed)
Patient ambulated in hall with assist. Patient used rolling walker and ambulated about 210 feet. Did stop for 2 breaks and sat down in wheelchair that was trailing him before resuming and completing walk. Patient tolerated ambulation well.

## 2016-10-11 DIAGNOSIS — Z8679 Personal history of other diseases of the circulatory system: Secondary | ICD-10-CM

## 2016-10-11 DIAGNOSIS — B9689 Other specified bacterial agents as the cause of diseases classified elsewhere: Secondary | ICD-10-CM

## 2016-10-11 DIAGNOSIS — I429 Cardiomyopathy, unspecified: Secondary | ICD-10-CM

## 2016-10-11 MED ORDER — SODIUM CHLORIDE 0.9% FLUSH
10.0000 mL | Freq: Two times a day (BID) | INTRAVENOUS | Status: DC
  Administered 2016-10-11 – 2016-10-12 (×2): 10 mL via INTRAVENOUS

## 2016-10-11 MED ORDER — SODIUM CHLORIDE 0.9 % IV SOLN: INTRAVENOUS | Status: DC

## 2016-10-11 MED ORDER — SODIUM CHLORIDE 0.9% FLUSH: 10.0000 mL | INTRAVENOUS | Status: DC | PRN

## 2016-10-11 NOTE — Progress Notes (Signed)
Subjective:  Pt feels well  Antibiotics:  Anti-infectives    Start     Dose/Rate Route Frequency Ordered Stop   10/09/16 1100  Ampicillin-Sulbactam (UNASYN) 3 g in sodium chloride 0.9 % 100 mL IVPB     3 g 200 mL/hr over 30 Minutes Intravenous Every 6 hours 10/09/16 1029     10/09/16 0845  vancomycin (VANCOCIN) IVPB 1000 mg/200 mL premix  Status:  Discontinued     1,000 mg 200 mL/hr over 60 Minutes Intravenous Every 24 hours 10/09/16 0832 10/09/16 0946   10/06/16 0800  vancomycin (VANCOCIN) IVPB 750 mg/150 ml premix  Status:  Discontinued     750 mg 150 mL/hr over 60 Minutes Intravenous Every 24 hours 10/06/16 0051 10/09/16 0831   10/04/16 2000  cefTRIAXone (ROCEPHIN) 2 g in dextrose 5 % 50 mL IVPB  Status:  Discontinued     2 g 100 mL/hr over 30 Minutes Intravenous Every 24 hours 10/02/16 1735 10/09/16 0946   10/03/16 0000  vancomycin (VANCOCIN) IVPB 1000 mg/200 mL premix  Status:  Discontinued     1,000 mg 200 mL/hr over 60 Minutes Intravenous  Once 10/02/16 1653 10/02/16 1737   10/03/16 0000  vancomycin (VANCOCIN) IVPB 1000 mg/200 mL premix  Status:  Discontinued     1,000 mg 200 mL/hr over 60 Minutes Intravenous Every 24 hours 10/02/16 1737 10/06/16 0051   10/02/16 2000  cefUROXime (ZINACEF) 1.5 g in dextrose 5 % 50 mL IVPB     1.5 g 100 mL/hr over 30 Minutes Intravenous Every 12 hours 10/02/16 1653 10/04/16 0820   10/02/16 0600  vancomycin (VANCOCIN) IVPB 1000 mg/200 mL premix  Status:  Discontinued     1,000 mg 200 mL/hr over 60 Minutes Intravenous Every 24 hours 10/01/16 1234 10/02/16 1737   10/02/16 0400  vancomycin (VANCOCIN) 1,250 mg in sodium chloride 0.9 % 250 mL IVPB  Status:  Discontinued     1,250 mg 166.7 mL/hr over 90 Minutes Intravenous To Surgery 10/01/16 1238 10/02/16 1638   10/02/16 0400  cefUROXime (ZINACEF) 1.5 g in dextrose 5 % 50 mL IVPB     1.5 g 100 mL/hr over 30 Minutes Intravenous To Surgery 10/01/16 1238 10/02/16 1425   10/02/16 0400   cefUROXime (ZINACEF) 750 mg in dextrose 5 % 50 mL IVPB  Status:  Discontinued     750 mg 100 mL/hr over 30 Minutes Intravenous To Surgery 10/01/16 1238 10/02/16 1638   10/02/16 0400  vancomycin (VANCOCIN) 1,000 mg in sodium chloride 0.9 % 1,000 mL irrigation      Irrigation To Surgery 10/01/16 1424 10/02/16 0910   09/29/16 1000  vancomycin (VANCOCIN) IVPB 750 mg/150 ml premix  Status:  Discontinued     750 mg 150 mL/hr over 60 Minutes Intravenous Every 12 hours 09/28/16 2139 10/01/16 1054   09/28/16 2200  cefTRIAXone (ROCEPHIN) 2 g in dextrose 5 % 50 mL IVPB  Status:  Discontinued     2 g 100 mL/hr over 30 Minutes Intravenous Every 24 hours 09/28/16 2139 10/02/16 1735   09/28/16 2200  vancomycin (VANCOCIN) 2,000 mg in sodium chloride 0.9 % 500 mL IVPB     2,000 mg 250 mL/hr over 120 Minutes Intravenous  Once 09/28/16 2139 09/29/16 0139   09/28/16 1300  Ampicillin-Sulbactam (UNASYN) 3 g in sodium chloride 0.9 % 100 mL IVPB  Status:  Discontinued     3 g 200 mL/hr over 30 Minutes Intravenous Every 8 hours 09/28/16 0842  09/28/16 2139   09/28/16 1223  Ampicillin-Sulbactam (UNASYN) 3 g in sodium chloride 0.9 % 100 mL IVPB  Status:  Discontinued     3 g 200 mL/hr over 30 Minutes Intravenous Every 8 hours 09/28/16 0834 09/28/16 0842   09/25/16 1000  ampicillin-sulbactam (UNASYN) 1.5 g in sodium chloride 0.9 % 50 mL IVPB  Status:  Discontinued     1.5 g 100 mL/hr over 30 Minutes Intravenous Every 6 hours 09/25/16 0940 09/28/16 0834   09/24/16 0815  cefTRIAXone (ROCEPHIN) 1 g in dextrose 5 % 50 mL IVPB     1 g 100 mL/hr over 30 Minutes Intravenous  Once 09/24/16 0805 09/24/16 0902   09/24/16 0815  vancomycin (VANCOCIN) IVPB 1000 mg/200 mL premix     1,000 mg 200 mL/hr over 60 Minutes Intravenous  Once 09/24/16 0805 09/24/16 1005      Medications: Scheduled Meds: . amiodarone  400 mg Oral Q8H  . aspirin EC  325 mg Oral Daily  . bisacodyl  10 mg Oral Daily   Or  . bisacodyl  10 mg Rectal  Daily  . Chlorhexidine Gluconate Cloth  6 each Topical Daily  . docusate sodium  200 mg Oral Daily  . enoxaparin (LOVENOX) injection  30 mg Subcutaneous Q24H  . ferrous sulfate  325 mg Oral Daily  . folic acid-pyridoxine-cyancobalamin  1 tablet Oral Daily  . furosemide  40 mg Oral Daily  . metoprolol tartrate  25 mg Oral Q8H  . mexiletine  200 mg Oral Q8H  . pantoprazole  40 mg Oral Daily  . potassium chloride  20 mEq Oral BID  . rosuvastatin  10 mg Oral Daily  . sodium chloride flush  3 mL Intravenous Q12H   Continuous Infusions: . sodium chloride    . sodium chloride    . ampicillin-sulbactam (UNASYN) IV 3 g (10/11/16 1708)   PRN Meds:.sodium chloride, sodium chloride, magnesium hydroxide, metoprolol tartrate, morphine injection, ondansetron (ZOFRAN) IV, oxyCODONE, sodium chloride flush, sodium chloride flush, traMADol    Objective: Weight change: 2 lb 7.7 oz (1.124 kg)  Intake/Output Summary (Last 24 hours) at 10/11/16 1757 Last data filed at 10/11/16 1708  Gross per 24 hour  Intake             1120 ml  Output              900 ml  Net              220 ml   Blood pressure 115/88, pulse 62, temperature 97.5 F (36.4 C), temperature source Oral, resp. rate 19, height 6' (1.829 m), weight 233 lb 4.8 oz (105.8 kg), SpO2 96 %. Temp:  [97.5 F (36.4 C)-98.6 F (37 C)] 97.5 F (36.4 C) (07/01 1616) Pulse Rate:  [55-69] 62 (07/01 1700) Resp:  [14-29] 19 (07/01 1700) BP: (95-127)/(62-88) 115/88 (07/01 1700) SpO2:  [91 %-99 %] 96 % (07/01 1700) Weight:  [233 lb 4.8 oz (105.8 kg)] 233 lb 4.8 oz (105.8 kg) (07/01 0300)  Physical Exam: General: Alert and awake, oriented x3, not in any acute distress. HEENT: anicteric sclera, pupils reactive to light and accommodation, EOMI CVS bradycardy  regular rate, normal r, click Chest: clear to auscultation bilaterally, no wheezing, rales or rhonchi Abdomen: soft nontender, nondistended, normal bowel sounds, Extremities: no  clubbing or  edema noted bilaterally Skin: sternal wound is clean Neuro: nonfocal  CBC:  CBC Latest Ref Rng & Units 10/09/2016 10/08/2016 10/07/2016  WBC 4.0 - 10.5 K/uL  7.5 8.2 8.1  Hemoglobin 13.0 - 17.0 g/dL 8.4(L) 8.9(L) 9.2(L)  Hematocrit 39.0 - 52.0 % 27.1(L) 29.0(L) 29.2(L)  Platelets 150 - 400 K/uL 214 189 155     BMET  Recent Labs  10/09/16 0329 10/10/16 0551  NA 134* 136  K 3.7 3.9  CL 99* 102  CO2 27 27  GLUCOSE 131* 99  BUN 27* 23*  CREATININE 1.38* 1.30*  CALCIUM 7.6* 7.8*     Liver Panel  No results for input(s): PROT, ALBUMIN, AST, ALT, ALKPHOS, BILITOT, BILIDIR, IBILI in the last 72 hours.     Sedimentation Rate No results for input(s): ESRSEDRATE in the last 72 hours. C-Reactive Protein No results for input(s): CRP in the last 72 hours.  Micro Results: Recent Results (from the past 720 hour(s))  Culture, blood (routine x 2)     Status: None   Collection Time: 09/24/16  7:37 AM  Result Value Ref Range Status   Specimen Description LEFT ANTECUBITAL  Final   Special Requests   Final    BOTTLES DRAWN AEROBIC AND ANAEROBIC Blood Culture adequate volume   Culture NO GROWTH 5 DAYS  Final   Report Status 09/29/2016 FINAL  Final  Culture, blood (routine x 2)     Status: None   Collection Time: 09/24/16  7:53 AM  Result Value Ref Range Status   Specimen Description BLOOD RIGHT HAND  Final   Special Requests   Final    BOTTLES DRAWN AEROBIC AND ANAEROBIC Blood Culture adequate volume   Culture NO GROWTH 5 DAYS  Final   Report Status 09/29/2016 FINAL  Final  Culture, blood (Routine X 2) w Reflex to ID Panel     Status: None   Collection Time: 09/28/16  9:00 PM  Result Value Ref Range Status   Specimen Description BLOOD RIGHT HAND  Final   Special Requests IN PEDIATRIC BOTTLE Blood Culture adequate volume  Final   Culture NO GROWTH 5 DAYS  Final   Report Status 10/03/2016 FINAL  Final  Culture, blood (Routine X 2) w Reflex to ID Panel     Status: None    Collection Time: 09/28/16  9:00 PM  Result Value Ref Range Status   Specimen Description BLOOD RIGHT WRIST  Final   Special Requests IN PEDIATRIC BOTTLE Blood Culture adequate volume  Final   Culture NO GROWTH 5 DAYS  Final   Report Status 10/03/2016 FINAL  Final  Surgical pcr screen     Status: None   Collection Time: 09/29/16  8:29 PM  Result Value Ref Range Status   MRSA, PCR NEGATIVE NEGATIVE Final   Staphylococcus aureus NEGATIVE NEGATIVE Final    Comment:        The Xpert SA Assay (FDA approved for NASAL specimens in patients over 8 years of age), is one component of a comprehensive surveillance program.  Test performance has been validated by [facility] for patients greater than or equal to 1 year old. It is not intended to diagnose infection nor to guide or monitor treatment.   Urine Culture     Status: None   Collection Time: 09/29/16 10:03 PM  Result Value Ref Range Status   Specimen Description URINE, CLEAN CATCH  Final   Special Requests NONE  Final   Culture NO GROWTH  Final   Report Status 10/01/2016 FINAL  Final  Aerobic/Anaerobic Culture (surgical/deep wound)     Status: None   Collection Time: 10/02/16 11:00 AM  Result Value Ref  Range Status   Specimen Description TISSUE  Final   Special Requests AORTIC VALVE  Final   Gram Stain   Final    RARE WBC PRESENT,BOTH PMN AND MONONUCLEAR NO ORGANISMS SEEN Gram Stain Report Called to,Read Back By and Verified With: Vaughan Sine AT 1206 10/02/16 BY L BENFIELD    Culture   Final    MIXED ANAEROBIC FLORA PRESENT.  CALL LAB IF FURTHER IID REQUIRED. NO AEROBIC GROWTH    Report Status 10/07/2016 FINAL  Final  Aerobic/Anaerobic Culture (surgical/deep wound)     Status: None   Collection Time: 10/02/16 11:00 AM  Result Value Ref Range Status   Specimen Description AORTIC VALVE TISSUE  Final   Special Requests SWAB  Final   Gram Stain   Final    RARE WBC PRESENT,BOTH PMN AND MONONUCLEAR NO ORGANISMS SEEN Gram  Stain Report Called to,Read Back By and Verified With: Vaughan Sine AT 1206 10/02/16 BY L BENFIELD    Culture No growth aerobically or anaerobically.  Final   Report Status 10/07/2016 FINAL  Final    Studies/Results: Dg Chest Port 1 View  Result Date: 10/10/2016 CLINICAL DATA:  Atelectasis.  Followup exam. EXAM: PORTABLE CHEST 1 VIEW COMPARISON:  10/07/2016 FINDINGS: Opacity appears improved at the left lung base, but increase at the right lung base. Right hemidiaphragm is now obscured. Findings consistent with improved atelectasis in the left lower lobe with development of a small right pleural effusion and mild increase right lung base atelectasis. No convincing pneumonia.  No evidence of pulmonary edema. Cardiac silhouette is enlarged. No convincing mediastinal or hilar masses. Stable changes from prior cardiac surgery. Right PICC is stable, tip at the caval atrial junction. IMPRESSION: 1. Increased opacity at the right lung base when compared to the prior study consistent with enlargement of a small pleural effusion and mild increase in lung base atelectasis. 2. Atelectasis at the left lung base appears improved from the prior study. 3. No other change.  No convincing pneumonia or pulmonary edema. Electronically Signed   By: Lajean Manes M.D.   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 No Known Allergies     OPAT Orders Discharge antibiotics: l  IV unasyn 3 g IV q  6 hours Aim for Vancomycin trough 15-20 (unless otherwise indicated) Duration: 6 weeks End Date: August 9th  [facility] Care Per Protocol:  Labs weekly while on IV antibiotics: _x_ CBC with differential _x_ BMP   x__ Please pull PIC at completion of IV antibiotics __ Please leave PIC in place until doctor has seen patient or been notified  Fax weekly labs to [phone]  Clinic Follow Up Appt:    Next 2 weeks  I will sign off please call with further questions.    LOS: 17 days   Alcide Evener 10/11/2016, 5:57 PM

## 2016-10-11 NOTE — Progress Notes (Signed)
Progress Note  Patient Name: Charles [name] Date of Encounter: 10/11/2016  Primary Cardiologist: Dr. Satira Sark  Subjective   Up in chair today. Has ambulated with assistance.  Inpatient Medications    Scheduled Meds: . amiodarone  400 mg Oral Q8H  . aspirin EC  325 mg Oral Daily  . bisacodyl  10 mg Oral Daily   Or  . bisacodyl  10 mg Rectal Daily  . Chlorhexidine Gluconate Cloth  6 each Topical Daily  . docusate sodium  200 mg Oral Daily  . enoxaparin (LOVENOX) injection  30 mg Subcutaneous Q24H  . ferrous sulfate  325 mg Oral Daily  . folic acid-pyridoxine-cyancobalamin  1 tablet Oral Daily  . furosemide  40 mg Oral Daily  . metoprolol tartrate  25 mg Oral Q8H  . mexiletine  200 mg Oral Q8H  . pantoprazole  40 mg Oral Daily  . potassium chloride  20 mEq Oral BID  . rosuvastatin  10 mg Oral Daily  . sodium chloride flush  3 mL Intravenous Q12H   Continuous Infusions: . sodium chloride    . sodium chloride    . ampicillin-sulbactam (UNASYN) IV 3 g (10/11/16 1021)   PRN Meds: sodium chloride, sodium chloride, magnesium hydroxide, metoprolol tartrate, morphine injection, ondansetron (ZOFRAN) IV, oxyCODONE, sodium chloride flush, sodium chloride flush, traMADol   Vital Signs    Vitals:   10/11/16 0700 10/11/16 0800 10/11/16 0900 10/11/16 1000  BP: 115/77 103/79 118/72 127/80  Pulse:  (!) 56 60 (!) 59  Resp: 14 16 (!) 22 (!) 22  Temp:  98.5 F (36.9 C)    TempSrc:      SpO2:  95% 93% 95%  Weight:      Height:        Intake/Output Summary (Last 24 hours) at 10/11/16 1205 Last data filed at 10/11/16 1000  Gross per 24 hour  Intake              850 ml  Output             1100 ml  Net             -250 ml   Filed Weights   10/09/16 0500 10/10/16 0600 10/11/16 0300  Weight: 229 lb 8 oz (104.1 kg) 230 lb 13.2 oz (104.7 kg) 233 lb 4.8 oz (105.8 kg)    Telemetry    No recurrent VT last 24 hours. Personally reviewed.  ECG    Tracing from 10/08/2016  shows sinus bradycardia with prolonged PR interval, increased voltage, Personally reviewed.  Physical Exam   GEN: No acute distress.   Neck: No JVD. Cardiac: RRR, soft systolic murmur, no gallop. Thorax: Stable incision. Respiratory: Nonlabored. Clear to auscultation bilaterally. GI: Soft, nontender, bowel sounds present. MS: Mild leg edema; No deformity.  Labs    Chemistry Recent Labs Lab 10/06/16 0249  10/07/16 0444  10/08/16 0448 10/08/16 1753 10/09/16 0329 10/10/16 0551  NA 136  < > 134*  < > 135  --  134* 136  K 3.2*  < > 3.1*  < > 3.8 4.1 3.7 3.9  CL 94*  < > 95*  < > 98*  --  99* 102  CO2 32  < > 31  < > 27  --  27 27  GLUCOSE 105*  < > 151*  < > 121*  --  131* 99  BUN 40*  < > 39*  < > 31*  --  27*  23*  CREATININE 2.13*  < > 1.74*  < > 1.44*  --  1.38* 1.30*  CALCIUM 8.1*  < > 8.0*  < > 7.8*  --  7.6* 7.8*  PROT 5.3*  --  5.2*  --  5.0*  --   --   --   ALBUMIN 2.5*  --  2.2*  --  2.1*  --   --   --   AST 16  --  13*  --  13*  --   --   --   ALT 15*  --  16*  --  14*  --   --   --   ALKPHOS 67  --  75  --  82  --   --   --   BILITOT 0.6  --  0.4  --  0.3  --   --   --   GFRNONAA 30*  < > 38*  < > 48*  --  51* 54*  GFRAA 35*  < > 44*  < > 56*  --  59* >60  ANIONGAP 10  < > 8  < > 10  --  8 7  < > = values in this interval not displayed.   Hematology Recent Labs Lab 10/07/16 0444 10/08/16 0448 10/09/16 0329  WBC 8.1 8.2 7.5  RBC 3.51* 3.49* 3.26*  HGB 9.2* 8.9* 8.4*  HCT 29.2* 29.0* 27.1*  MCV 83.2 83.1 83.1  MCH 26.2 25.5* 25.8*  MCHC 31.5 30.7 31.0  RDW 16.0* 15.8* 15.8*  PLT 155 189 214    Cardiac Enzymes Recent Labs Lab 10/06/16 0556 10/06/16 1124 10/06/16 1615  TROPONINI 4.36* 2.89* 3.66*   No results for input(s): TROPIPOC in the last 168 hours.   Radiology    Dg Chest Port 1 View  Result Date: 10/10/2016 CLINICAL DATA:  Atelectasis.  Followup exam. EXAM: PORTABLE CHEST 1 VIEW COMPARISON:  10/07/2016 FINDINGS: Opacity appears improved  at the left lung base, but increase at the right lung base. Right hemidiaphragm is now obscured. Findings consistent with improved atelectasis in the left lower lobe with development of a small right pleural effusion and mild increase right lung base atelectasis. No convincing pneumonia.  No evidence of pulmonary edema. Cardiac silhouette is enlarged. No convincing mediastinal or hilar masses. Stable changes from prior cardiac surgery. Right PICC is stable, tip at the caval atrial junction. IMPRESSION: 1. Increased opacity at the right lung base when compared to the prior study consistent with enlargement of a small pleural effusion and mild increase in lung base atelectasis. 2. Atelectasis at the left lung base appears improved from the prior study. 3. No other change.  No convincing pneumonia or pulmonary edema. Electronically Signed   By: Lajean Manes M.D.   On: 10/10/2016 09:52    Cardiac Studies   Cardiac catheterization 10/07/2016:  Prox Cx to Mid Cx lesion, 30 %stenosed.  Mid LAD lesion, 20 %stenosed.  Ost 2nd Diag lesion, 20 %stenosed.  Dist LAD lesion, 40 %stenosed.  Ost RCA lesion, 100 %stenosed.  SVG graft was visualized by angiography and is very large and anatomically normal.   1. 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 Patent SVG to PDA. The entire RCA fills from the patent vein graft.  3. Mild disease in the Circumflex and LAD. No change since last cath.  4. Normal LVEDP  Echocardiogram 10/06/2016: Left ventricle: The cavity size was normal. Wall thickness was  increased in a pattern of moderate LVH. Systolic function was   mildly to moderately reduced. The estimated ejection fraction was   in the range of 40% to 45%. Diffuse hypokinesis with more severe   anteroseptal hypokinesis/dyskinesis. The study is not technically   sufficient to allow evaluation of LV diastolic function. - Aortic valve: Poorly visualized. There  was mild regurgitation. - Aorta: The aortic root has been replaced - measures 3.9 cm. - Mitral valve: Mildly thickened leaflets . There was mild   regurgitation. - Left atrium: The atrium was normal in size. - Right atrium: The atrium was mildly dilated. - Tricuspid valve: There was trivial regurgitation. - Pulmonary arteries: PA peak pressure: 22 mm Hg (S). - Inferior vena cava: The vessel was normal in size. The   respirophasic diameter changes were in the normal range (>= 50%),   consistent with normal central venous pressure.  Impressions:  - Compared to a prior study on 09/28/2016, the LVEF is lower at   40-45% with global hypokinesis and more severe   anteroseptal/septal hypokinesis/dyskinesis.  Patient Profile     71 y.o. male with history of subacute bacterial endocarditis involving bicuspid aortic valve, severe aortic regurgitation, ascending aortic aneurysm, CAD, now status post aortic root replacement with AVR and SVG to PDA. 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 Follow-up echocardiogram 40-45% range. Subsequent cardiac catheterization shows stable coronary anatomy and patent vein graft. He is being followed by the EP service, currently on mexiletine and amiodarone with stable rhythm.  Assessment & Plan    1. Polymorphic VT/F, quiescent at this time on mexiletine and amiodarone. Patient being followed by the EP service. Pacing has been discontinued as well. Currently maintaining sinus rhythm and sinus bradycardia.  2. Status post aortic root replacement with AVR and SVG to PDA. Continuing progression per TCTS.  3. History of bacterial endocarditis. Cultures from OR growing mixed anaerobes. Patient on planned 6 week course of Unasyn.  4. Cardiomyopathy, LVEF 40-45% by most recent echocardiogram.  Continue aspirin, Crestor and low-dose Lopressor. No change to mexiletine and amiodarone. Blood pressure low normal, hold off on adding ARB, this might be a later  consideration with his cardiomyopathy.  Signed, Rozann Lesches, MD  10/11/2016, 12:05 PM

## 2016-10-11 NOTE — Progress Notes (Signed)
4 Days Post-Op Procedure(s) (LRB): Left Heart Cath and Cors/Grafts Angiography (N/A) Subjective:  No complaints, feels well, ambulated  No further arrhythmias  Objective: Vital signs in last 24 hours: Temp:  [98.1 F (36.7 C)-98.6 F (37 C)] 98.4 F (36.9 C) (07/01 1207) Pulse Rate:  [55-84] 58 (07/01 1207) Cardiac Rhythm: Sinus bradycardia;Heart block (07/01 0800) Resp:  [14-27] 26 (07/01 1207) BP: (88-127)/(58-88) 106/71 (07/01 1207) SpO2:  [91 %-100 %] 92 % (07/01 1207) Weight:  [105.8 kg (233 lb 4.8 oz)] 105.8 kg (233 lb 4.8 oz) (07/01 0300)  Hemodynamic parameters for last 24 hours:    Intake/Output from previous day: 06/30 0701 - 07/01 0700 In: 1288.7 [P.O.:720; I.V.:48.7; IV Piggyback:400] Out: 1500 [Urine:1500] Intake/Output this shift: Total I/O In: 390 [P.O.:240; Other:50; IV Piggyback:100] Out: 250 [Urine:250]  General appearance: alert and cooperative Neurologic: intact Heart: regular rate and rhythm, S1, S2 normal, no murmur, click, rub or gallop Lungs: clear to auscultation bilaterally Extremities: extremities normal, atraumatic, no cyanosis or edema Wound: incisions ok  Lab Results:  Recent Labs  10/09/16 0329  WBC 7.5  HGB 8.4*  HCT 27.1*  PLT 214   BMET:  Recent Labs  10/09/16 0329 10/10/16 0551  NA 134* 136  K 3.7 3.9  CL 99* 102  CO2 27 27  GLUCOSE 131* 99  BUN 27* 23*  CREATININE 1.38* 1.30*  CALCIUM 7.6* 7.8*    PT/INR: No results for input(s): LABPROT, INR in the last 72 hours. ABG    Component Value Date/Time   PHART 7.499 (H) 10/07/2016 0457   HCO3 34.6 (H) 10/07/2016 0457   TCO2 36 10/07/2016 0457   ACIDBASEDEF 1.0 10/02/2016 2329   O2SAT 65.4 10/08/2016 0450   CBG (last 3)  No results for input(s): GLUCAP in the last 72 hours.  Assessment/Plan: S/P Procedure(s) (LRB): Left Heart Cath and Cors/Grafts Angiography (N/A)  He remains hemodynamically stable in sinus rhythm with no further VT on amio and  mexiletine.  History of endocarditis and OR cultures growing mixed anaerobes. ID planning 6 wk of Unasyn.  Continue IS, ambulation  May be able to transfer to 2W tomorrow.   LOS: 17 days    Gaye Pollack 10/11/2016

## 2016-10-12 MED ORDER — SODIUM CHLORIDE 0.9% FLUSH: 3.0000 mL | Freq: Two times a day (BID) | INTRAVENOUS | Status: DC

## 2016-10-12 MED ORDER — SODIUM CHLORIDE 0.9% FLUSH: 3.0000 mL | INTRAVENOUS | Status: DC | PRN

## 2016-10-12 MED ORDER — AMIODARONE HCL 200 MG PO TABS
400.0000 mg | ORAL_TABLET | Freq: Two times a day (BID) | ORAL | Status: DC
  Administered 2016-10-12 – 2016-10-13 (×4): 400 mg via ORAL
  Filled 2016-10-12 (×3): qty 2

## 2016-10-12 MED ORDER — SODIUM CHLORIDE 0.9 % IV SOLN: 250.0000 mL | INTRAVENOUS | Status: DC | PRN

## 2016-10-12 MED ORDER — MOVING RIGHT ALONG BOOK
Freq: Once | Status: DC
  Filled 2016-10-12: qty 1

## 2016-10-12 MED ORDER — MAGNESIUM HYDROXIDE 400 MG/5ML PO SUSP: 30.0000 mL | Freq: Every day | ORAL | Status: DC | PRN

## 2016-10-12 MED ORDER — ALUM & MAG HYDROXIDE-SIMETH 200-200-20 MG/5ML PO SUSP: 15.0000 mL | Freq: Four times a day (QID) | ORAL | Status: DC | PRN

## 2016-10-12 NOTE — Progress Notes (Signed)
Patient admitted to 2W04, A&Ox4, VSS. Tele applied and CCMD notified. Patient oriented to room and denies any needs at this time.

## 2016-10-12 NOTE — Progress Notes (Signed)
CARDIAC REHAB PHASE I   PRE:  Rate/Rhythm: 66 SR    BP: sitting 114/64    SaO2: 95 RA  MODE:  Ambulation: 300 ft   POST:  Rate/Rhythm: 78 SR    BP: sitting 110/62     SaO2: 95 RA  Pt needs verbal cues for appropriate mechanics with standing and scooting in recliner. Walked without RW in room with gait belt, to BR. Pt struggled to process how to wash his hands. Used RW in hall for endurance. Fairly steady. Fatigued with distance. Return to recliner (didn't use RW in room again). Will f/u. Wife in room [place], [place] 10/12/2016 3:07 PM

## 2016-10-12 NOTE — NC FL2 (Signed)
[place] LEVEL OF CARE SCREENING TOOL     IDENTIFICATION  Patient Name: Charles [name] Birthdate: April 25, 1945 Sex: male Admission Date (Current Location): 09/24/2016  [facility] and [place] Number:  [employer] and Address:  [facility] [place], Trainer [address], [place], [place] [address]      Provider Number: [id number]  Attending Physician Name and Address:  [name], MD  Relative Name and Phone Number:       Current Level of Care: Hospital Recommended Level of Care: [place] Prior Approval Number:    Date Approved/Denied:   PASRR Number: [id number]  Discharge Plan: SNF    Current Diagnoses: Patient Active Problem List   Diagnosis Date Noted  . CHF (congestive heart failure) ([place])   . S/P AVR   . Acute blood loss anemia   . Tachypnea   . Ventricular tachycardia ([place])   . 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 S/P patent foramen ovale closure 10/02/2016  . S/P aortic valve allograft 10/02/2016  . Insomnia 09/27/2016  . Acute diastolic CHF (congestive heart failure), NYHA class 4 ([place]) 09/25/2016  . CHF (congestive heart failure), NYHA class II ([place]) 09/24/2016  . Aortic valve disorder   . Coronary artery disease involving native heart without angina pectoris   . Second degree AV block, Mobitz type I 07/08/2016  . Weight loss   . Bacterial endocarditis   . Protein-calorie malnutrition, severe 07/03/2016  . Anemia 07/02/2016  . H/O cardiac murmur 07/02/2016  . Ascending aortic aneurysm ([place]) 07/02/2016  . NSTEMI (non-ST elevated myocardial infarction) ([place]) 07/02/2016  . Bicuspid aortic valve 07/02/2016  . Aortic insufficiency 07/02/2016    Orientation RESPIRATION BLADDER Height & Weight     Self, Time, Situation, Place  Normal Continent Weight: 233 lb 6.4 oz (105.9 kg) Height:  6' (182.9 cm)  BEHAVIORAL SYMPTOMS/MOOD NEUROLOGICAL  BOWEL NUTRITION STATUS      Continent Diet (cardiac)  AMBULATORY STATUS COMMUNICATION OF NEEDS Skin   Extensive Assist Verbally Normal                       Personal Care Assistance Level of Assistance  Bathing, Dressing Bathing Assistance: Maximum assistance   Dressing Assistance: Maximum assistance     Functional Limitations Info             SPECIAL CARE FACTORS FREQUENCY  PT (By licensed PT), OT (By licensed OT)     PT Frequency: 5/wk OT Frequency: 5/wk            Contractures      Additional Factors Info  Code Status, Allergies Code Status Info: FULL Allergies Info: NKA           Current Medications (10/12/2016):  This is the current hospital active medication list Current Facility-Administered Medications  Medication Dose Route Frequency Provider Last Rate Last Dose  . 0.9 %  sodium chloride infusion  250 mL Intravenous PRN Lauree Chandler D, MD      . 0.9 %  sodium chloride infusion  250 mL Intravenous PRN [name], MD      . 0.9 %  sodium chloride infusion   Intravenous Continuous [name], MD 10 mL/hr at 10/12/16 0730    . amiodarone (PACERONE) tablet 400 mg  400 mg Oral BID Thompson Grayer, MD      . Ampicillin-Sulbactam (  UNASYN) 3 g in sodium chloride 0.9 % 100 mL IVPB  3 g Intravenous Q6H Carney, Jessica C, RPH 200 mL/hr at 10/12/16 0625 3 g at 10/12/16 0625  . aspirin EC tablet 325 mg  325 mg Oral Daily [name], MD   325 mg at 10/11/16 1017  . bisacodyl (DULCOLAX) EC tablet 10 mg  10 mg Oral Daily [name], MD   10 mg at 10/11/16 1012   Or  . bisacodyl (DULCOLAX) suppository 10 mg  10 mg Rectal Daily [name], MD      . Chlorhexidine Gluconate Cloth 2 % PADS 6 each  6 each Topical Daily [name], MD   6 each at 10/11/16 1000  . docusate sodium (COLACE) capsule 200 mg  200 mg Oral Daily [name], MD   200 mg at 10/08/16 0931  . enoxaparin (LOVENOX) injection 30 mg  30 mg Subcutaneous Q24H  [name], MD   30 mg at 10/11/16 1017  . ferrous sulfate tablet 325 mg  325 mg Oral Daily [name], MD   325 mg at 10/11/16 1013  . folic acid-pyridoxine-cyancobalamin (FOLTX) 2.5-25-2 MG per tablet 1 tablet  1 tablet Oral Daily [name], MD   1 tablet at 10/11/16 1012  . furosemide (LASIX) tablet 40 mg  40 mg Oral Daily [name], MD   40 mg at 10/11/16 1013  . magnesium hydroxide (MILK OF MAGNESIA) suspension 30 mL  30 mL Oral Daily PRN [name], MD      . metoprolol tartrate (LOPRESSOR) injection 2.5-5 mg  2.5-5 mg Intravenous Q2H PRN [name], MD      . metoprolol tartrate (LOPRESSOR) tablet 25 mg  25 mg Oral Q8H Deboraha Sprang, MD   25 mg at 10/11/16 1404  . mexiletine (MEXITIL) capsule 200 mg  200 mg Oral Q8H Baldwin [place], PA-C   200 mg at 10/12/16 9357  . morphine 4 MG/ML injection 1-2 mg  1-2 mg Intravenous Q1H PRN [name], MD   2 mg at 10/06/16 1003  . ondansetron (ZOFRAN) injection 4 mg  4 mg Intravenous Q6H PRN [name], MD   4 mg at 10/06/16 1200  . oxyCODONE (Oxy IR/ROXICODONE) immediate release tablet 5-10 mg  5-10 mg Oral Q3H PRN [name], MD   10 mg at 10/05/16 0849  . pantoprazole (PROTONIX) EC tablet 40 mg  40 mg Oral Daily [name], MD   40 mg at 10/11/16 1013  . potassium chloride SA (K-DUR,KLOR-CON) CR tablet 20 mEq  20 mEq Oral BID [name], MD   20 mEq at 10/11/16 2143  . rosuvastatin (CRESTOR) tablet 10 mg  10 mg Oral Daily [name], MD   10 mg at 10/11/16 1013  . sodium chloride flush (NS) 0.9 % injection 10 mL  10 mL Intravenous Q12H [name], MD   10 mL at 10/11/16 2143  . sodium chloride flush (NS) 0.9 % injection 10 mL  10 mL Intravenous PRN [name], MD      . traMADol Veatrice Bourbon) tablet 50-100 mg  50-100 mg Oral Q4H PRN [name], MD         Discharge Medications: Please see discharge summary for a list of discharge medications.  Relevant  Imaging Results:  Relevant Lab Results:   Additional Information SS#: [id number], will need IV unasyn for 6 weeks  Jorge Ny, LCSW

## 2016-10-12 NOTE — Progress Notes (Signed)
Physical Therapy Treatment Patient Details Name: Charles [name] MRN: [id number] DOB: [birth year] Today's Date: 10/12/2016    History of Present Illness Patient is a 71 year old moderately obese Quantarius Genrich male with known history of heart murmur, who was recently hospitalized for Streptococcus viridans subacute bacterial endocarditis.  Patient underwent aortic root replacement, CABG x 2 and repair of PFO on 10/02/16.    PT Comments    Pt admitted with above diagnosis. Pt currently with functional limitations due to balance and endurance deficits. Pt was able to ambulate without device with min to min guard assist.  Unsteady at times and told pt he really should use the RW.  Will continue to follow acutely.   Pt will benefit from skilled PT to increase their independence and safety with mobility to allow discharge to the venue listed below.     Follow Up Recommendations  CIR     Equipment Recommendations  Rolling walker with 5" wheels    Recommendations for Other Services Rehab consult     Precautions / Restrictions Precautions Precautions: Fall;Sternal    Mobility  Bed Mobility Overal bed mobility: Needs Assistance Bed Mobility: Supine to Sit     Supine to sit: Min assist;HOB elevated     General bed mobility comments: Needed assist to elevate trunk.  Held pillow and gave cues for sternal precautions   Transfers Overall transfer level: Needs assistance Equipment used: Rolling walker (2 wheeled) Transfers: Sit to/from Stand Sit to Stand: Min assist;From elevated surface         General transfer comment: Pt required cues for technique hugging pillow but needed a little lift assist.   Ambulation/Gait Ambulation/Gait assistance: Min assist;Min guard Ambulation Distance (Feet): 270 Feet Assistive device: None Gait Pattern/deviations: Step-to pattern;Step-through pattern;Decreased stride length;Shuffle;Staggering left;Staggering right;Drifts right/left;Trunk flexed;Wide base of  support   Gait velocity interpretation: Below normal speed for age/gender General Gait Details: Pt was able to ambulate without device with cues to stand tall as at times, he starts to bend slightly witout constant cues.  Pt staggers at times needing min assist to steady.  Safer with RWShuffles at times.    Stairs            Wheelchair Mobility    Modified Rankin (Stroke Patients Only)       Balance Overall balance assessment: Needs assistance Sitting-balance support: Feet supported Sitting balance-Leahy Scale: Fair     Standing balance support: Bilateral upper extremity supported Standing balance-Leahy Scale: Poor Standing balance comment: reliant on UE support as he is unsteady without support                            Cognition Arousal/Alertness: Awake/alert Behavior During Therapy: WFL for tasks assessed/performed Overall Cognitive Status: Within Functional Limits for tasks assessed                                        Exercises      General Comments        Pertinent Vitals/Pain Pain Assessment: Faces Faces Pain Scale: Hurts little more Pain Location: generalized Pain Descriptors / Indicators: Discomfort;Sore Pain Intervention(s): Limited activity within patient's tolerance;Monitored during session;Repositioned    Home Living                      Prior Function  PT Goals (current goals can now be found in the care plan section) Progress towards PT goals: Progressing toward goals    Frequency    Min 3X/week      PT Plan Current plan remains appropriate    Co-evaluation              AM-PAC PT "6 Clicks" Daily Activity  Outcome Measure  Difficulty turning over in bed (including adjusting bedclothes, sheets and blankets)?: Total Difficulty moving from lying on back to sitting on the side of the bed? : A Lot Difficulty sitting down on and standing up from a chair with arms (e.g.,  wheelchair, bedside commode, etc,.)?: A Lot Help needed moving to and from a bed to chair (including a wheelchair)?: A Little Help needed walking in hospital room?: A Little Help needed climbing 3-5 steps with a railing? : Total 6 Click Score: 12    End of Session Equipment Utilized During Treatment: Gait belt Activity Tolerance: Patient tolerated treatment well Patient left: in chair;with call bell/phone within reach Nurse Communication: Mobility status PT Visit Diagnosis: Muscle weakness (generalized) (M62.81);Difficulty in walking, not elsewhere classified (R26.2)     Time: 9574-7340 PT Time Calculation (min) (ACUTE ONLY): 13 min  Charges:  $Gait Training: 8-22 mins                    G Codes:       Noal Abshier,PT Acute Rehabilitation [phone] (pager)    Denice Paradise 10/12/2016, 3:15 PM

## 2016-10-12 NOTE — Plan of Care (Signed)
Problem: Activity: Goal: Risk for activity intolerance will decrease Outcome: Progressing Pt increasing distance and frequency of ambulations daily, without difficulty.  Problem: Bowel/Gastric: Goal: Gastrointestinal status for postoperative course will improve Outcome: Completed/Met Date Met: 10/12/16 Pt advanced to heart healthy/ carb mod diet, consuming 75-100 % of each meal. BM x 3 10-11-16.  Problem: Nutritional: Goal: Risk for body nutrition deficit will decrease Outcome: Completed/Met Date Met: 10/12/16 Pt advanced to heart healthy/ carb mod diet, consuming 75-100 % of each meal. BM x 3 10-11-16.

## 2016-10-12 NOTE — Progress Notes (Signed)
5 Days Post-Op Procedure(s) (LRB): Left Heart Cath and Cors/Grafts Angiography (N/A) Subjective: No complaints, wants to get out of ICU  Objective: Vital signs in last 24 hours: Temp:  [97.5 F (36.4 C)-98.7 F (37.1 C)] 98.2 F (36.8 C) (07/02 0700) Pulse Rate:  [55-69] 62 (07/02 0730) Cardiac Rhythm: Sinus bradycardia;Heart block (07/02 0720) Resp:  [17-29] 25 (07/02 0730) BP: (97-127)/(61-88) 114/71 (07/02 0600) SpO2:  [92 %-99 %] 95 % (07/02 0730) Weight:  [233 lb 6.4 oz (105.9 kg)] 233 lb 6.4 oz (105.9 kg) (07/02 0600)  Hemodynamic parameters for last 24 hours:    Intake/Output from previous day: 07/01 0701 - 07/02 0700 In: 1360 [P.O.:960; I.V.:80; IV Piggyback:200] Out: 625 [Urine:625] Intake/Output this shift: Total I/O In: 470 [P.O.:240; I.V.:30; IV Piggyback:200] Out: -   General appearance: alert, cooperative and no distress Neurologic: intact Heart: regular rate and rhythm Lungs: clear to auscultation bilaterally Wound: clean and dry  Lab Results: No results for input(s): WBC, HGB, HCT, PLT in the last 72 hours. BMET:  Recent Labs  10/10/16 0551  NA 136  K 3.9  CL 102  CO2 27  GLUCOSE 99  BUN 23*  CREATININE 1.30*  CALCIUM 7.8*    PT/INR: No results for input(s): LABPROT, INR in the last 72 hours. ABG    Component Value Date/Time   PHART 7.499 (H) 10/07/2016 0457   HCO3 34.6 (H) 10/07/2016 0457   TCO2 36 10/07/2016 0457   ACIDBASEDEF 1.0 10/02/2016 2329   O2SAT 65.4 10/08/2016 0450   CBG (last 3)  No results for input(s): GLUCAP in the last 72 hours.  Assessment/Plan: S/P Procedure(s) (LRB): Left Heart Cath and Cors/Grafts Angiography (N/A) Plan for transfer to step-down: see transfer orders   CV- s/p AVR- stable, still diuresing for acute on chronic diastolic heart failure  Maintaining SR on current regimen  RESP_ stable  RENAL- creatinine 1.3, down form 1.38, lytes OK  Continue PO lasix  ENDO- normal CBG  Anemia secondary to  ABL- mild, follow  Continue ambulation  ID- on Unasyn     LOS: 18 days    Charles [name] 10/12/2016

## 2016-10-12 NOTE — Progress Notes (Signed)
Electrophysiology Rounding Note  Patient Name: Charles [name] Date of Encounter: 10/12/2016  Primary Cardiologist: Domenic Polite Electrophysiologist: Breylin Dom   Subjective   The patient is doing well today.  At this time, the patient denies chest pain, shortness of breath, or any new concerns. He is looking forward to moving out of ICU today.   Inpatient Medications    Scheduled Meds: . amiodarone  400 mg Oral Q8H  . aspirin EC  325 mg Oral Daily  . bisacodyl  10 mg Oral Daily   Or  . bisacodyl  10 mg Rectal Daily  . Chlorhexidine Gluconate Cloth  6 each Topical Daily  . docusate sodium  200 mg Oral Daily  . enoxaparin (LOVENOX) injection  30 mg Subcutaneous Q24H  . ferrous sulfate  325 mg Oral Daily  . folic acid-pyridoxine-cyancobalamin  1 tablet Oral Daily  . furosemide  40 mg Oral Daily  . metoprolol tartrate  25 mg Oral Q8H  . mexiletine  200 mg Oral Q8H  . pantoprazole  40 mg Oral Daily  . potassium chloride  20 mEq Oral BID  . rosuvastatin  10 mg Oral Daily  . sodium chloride flush  10 mL Intravenous Q12H   Continuous Infusions: . sodium chloride    . sodium chloride    . sodium chloride 10 mL/hr at 10/12/16 0600  . ampicillin-sulbactam (UNASYN) IV 3 g (10/12/16 0625)   PRN Meds: sodium chloride, sodium chloride, magnesium hydroxide, metoprolol tartrate, morphine injection, ondansetron (ZOFRAN) IV, oxyCODONE, sodium chloride flush, traMADol   Vital Signs    Vitals:   10/12/16 0300 10/12/16 0400 10/12/16 0600 10/12/16 0700  BP:  127/80 114/71   Pulse:  63 60   Resp:  18 (!) 21   Temp: 98.1 F (36.7 C)   98.2 F (36.8 C)  TempSrc: Oral   Oral  SpO2:  95% 93%   Weight:   233 lb 6.4 oz (105.9 kg)   Height:        Intake/Output Summary (Last 24 hours) at 10/12/16 0750 Last data filed at 10/12/16 0600  Gross per 24 hour  Intake             1360 ml  Output              625 ml  Net              735 ml   Filed Weights   10/10/16 0600 10/11/16 0300 10/12/16  0600  Weight: 230 lb 13.2 oz (104.7 kg) 233 lb 4.8 oz (105.8 kg) 233 lb 6.4 oz (105.9 kg)    Physical Exam    GEN- The patient is well appearing, alert and oriented x 3 today.   Head- normocephalic, atraumatic Eyes-  Sclera clear, conjunctiva pink Ears- hearing intact Oropharynx- clear Neck- supple Lungs- Clear to ausculation bilaterally, normal work of breathing Heart- Regular rate and rhythm  GI- soft, NT, ND, + BS Extremities- no clubbing, cyanosis,+dependent BLE edema Skin- no rash or lesion Psych- euthymic mood, full affect Neuro- strength and sensation are intact  Labs    Basic Metabolic Panel  Recent Labs  10/10/16 0551  NA 136  K 3.9  CL 102  CO2 27  GLUCOSE 99  BUN 23*  CREATININE 1.30*  CALCIUM 7.8*     Telemetry    Sinus rhythm (personally reviewed)  Radiology    No results found.   Patient Profile      71 y.o. male with history of  subacute bacterial endocarditis involving bicuspid aortic valve, severe aortic regurgitation, ascending aortic aneurysm, CAD, now status post aortic root replacement with AVR and SVG to PDA. 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 Follow-up echocardiogram 40-45% range. Subsequent cardiac catheterization shows stable coronary anatomy and patent vein graft. Rhythm has been stable on Amiodarone and Mexiletine  Assessment & Plan    1.  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 Continue until outpatient EP follow up (arranged and entered in AVS) Continue Mexiletine 200mg  q8hours until discharge then 200mg  q12 hours Will place LifeVest at discharge - ordered today.  No driving x6 months Keep K >3.9, Mg >1.8  2.  S/p AVR/CABG Per TCTS  3.  Endocarditis Plan per ID 6 weeks of Unasyn  Dr Rayann Heman to see later today.    Signed, Chanetta Marshall, NP  10/12/2016, 7:50 AM   I have seen, examined the patient, and reviewed the above assessment and plan.  On exam, RRR.   Changes to above are made where necessary.  Will transfer to 2W today.  Anticipate discharge later in the week with lifevest.  No driving x 6 months.  I will need to follow closely in the office for AAD management.  EP to follow while here.  Co Sign: Thompson Grayer, MD 10/12/2016 8:24 AM

## 2016-10-12 NOTE — Progress Notes (Signed)
Rehab admissions - I met with patient at the bedside.  He tells me that he would like to go to a rehab facility in [place] and that he lives close to [facility].  His wife would like him closer to home for his rehab.  Call me for questions.  #[phone]

## 2016-10-12 NOTE — Care Management Note (Signed)
Case Management Note Previous CM note initiated by Bethena Roys, RN 09/30/2016, 12:13 PM   Patient Details  Name: Charles [name] MRN: [id number] Date of Birth: 1946-01-02  Subjective/Objective:  Pt presented for SOB on exertion-CHF-initiated on IV diuresis. Pt with Severe Aortic Regurgitation. Plan will be to proceed on Friday 10-02-16  For AVR/ CABG. Pt is from home with wife. Pt will need PT/OT once stable for disposition recommendations. Pt may need HH vs SNF as it nears closer to d/c. PTA pt was independent- No DME.                  Action/Plan: CM will continue to monitor for additional needs.   Expected Discharge Date:                  Expected Discharge Plan:  Skilled Nursing Facility  In-House Referral:  Clinical Social Work  Discharge planning Services  CM Consult  Post Acute Care Choice:  Durable Medical Equipment Choice offered to:     DME Arranged:  Life vest DME Agency:  [facility]  HH Arranged:    [place] Agency:     Status of Service:  In process, will continue to follow  If discussed at Long Length of Stay Meetings, dates discussed: 7/3   Discharge Disposition:    Additional Comments:  10/12/16- 1700- [name] RN, CM- Pt s/p AVR and CABGx1 on 10/02/16-  Post op VT- will need LifeVest for discharge- spoke with [name] at [facility]- order has been received- pt has been approved- plan to fit pt tomorrow for Life vest prior to discharge- CIR had been consulted however per note from [place] with CIR- pt would prefer SNF- will have CSW see pt for placement needs.   [name], RN 10/12/2016, 10:20 PM [phone]

## 2016-10-13 ENCOUNTER — Inpatient Hospital Stay (HOSPITAL_COMMUNITY): Payer: Medicare Other

## 2016-10-13 ENCOUNTER — Encounter: Payer: Self-pay | Admitting: Internal Medicine

## 2016-10-13 LAB — BASIC METABOLIC PANEL
ANION GAP: 7 (ref 5–15)
BUN: 17 mg/dL (ref 6–20)
CALCIUM: 7.9 mg/dL — AB (ref 8.9–10.3)
CHLORIDE: 107 mmol/L (ref 101–111)
CO2: 26 mmol/L (ref 22–32)
Creatinine, Ser: 1.51 mg/dL — ABNORMAL HIGH (ref 0.61–1.24)
GFR calc non Af Amer: 45 mL/min — ABNORMAL LOW (ref >60)
GFR, EST AFRICAN AMERICAN: 53 mL/min — AB (ref >60)
GLUCOSE: 90 mg/dL (ref 65–99)
POTASSIUM: 3.7 mmol/L (ref 3.5–5.1)
Sodium: 140 mmol/L (ref 135–145)

## 2016-10-13 LAB — CBC
HEMATOCRIT: 26.8 % — AB (ref 39.0–52.0)
HEMOGLOBIN: 8.3 g/dL — AB (ref 13.0–17.0)
MCH: 25.9 pg — ABNORMAL LOW (ref 26.0–34.0)
MCHC: 31 g/dL (ref 30.0–36.0)
MCV: 83.8 fL (ref 78.0–100.0)
Platelets: 369 10*3/uL (ref 150–400)
RBC: 3.2 MIL/uL — AB (ref 4.22–5.81)
RDW: 16.1 % — ABNORMAL HIGH (ref 11.5–15.5)
WBC: 8.7 10*3/uL (ref 4.0–10.5)

## 2016-10-13 NOTE — Progress Notes (Signed)
Occupational Therapy Treatment Patient Details Name: Charles [name] MRN: [id number] DOB: 1945-10-27 Today's Date: 10/13/2016    History of present illness Patient is a 71 year old moderately obese white male with known history of heart murmur, who was recently hospitalized for Streptococcus viridans subacute bacterial endocarditis.  Patient underwent aortic root replacement, CABG x 2 and repair of PFO on 10/02/16.   OT comments  Pt progressing well. Educated in sternal precautions at length. Instructed in use of AE for LB bathing and dressing. Practiced sit to stand from elevated surfaces using hands on knees and momentum. Pt prefers to go to SNF near his home, changed recommendation.   Follow Up Recommendations  SNF    Equipment Recommendations  3 in 1 bedside commode    Recommendations for Other Services      Precautions / Restrictions Precautions Precautions: Fall;Sternal Precaution Comments: pt able to state sternal precautions with prompting, generalizing requires verbal cues       Mobility Bed Mobility Overal bed mobility: Needs Assistance Bed Mobility: Supine to Sit     Supine to sit: Mod assist;HOB elevated     General bed mobility comments: cues for technique, held pillow to chest, assist to raise trunk  Transfers Overall transfer level: Needs assistance Equipment used: Rolling walker (2 wheeled) Transfers: Sit to/from Stand Sit to Stand: Min guard;From elevated surface         General transfer comment: pt able to stand with hands on knees and use of momentum with min guard assist for safety, improved ability to scoot hips to edge of sitting surface, practiced x 3 from various surfaces    Balance Overall balance assessment: Needs assistance   Sitting balance-Leahy Scale: Fair       Standing balance-Leahy Scale: Fair Standing balance comment: able to release walker in static standing                           ADL either performed or assessed  with clinical judgement   ADL Overall ADL's : Needs assistance/impaired           Upper Body Bathing Details (indicate cue type and reason): educated in use of long handled bath sponge for back   Lower Body Bathing Details (indicate cue type and reason): educated in [place] of long handled bath sponge and reacher for washing and drying feet Upper Body Dressing : Set up;Sitting   Lower Body Dressing: Minimal assistance;Sit to/from stand Lower Body Dressing Details (indicate cue type and reason): educated in use of reacher, sock aide, long handled bath sponge Toilet Transfer: Minimal assistance;RW;Ambulation   Toileting- Clothing Manipulation and Hygiene: Minimal assistance;Sit to/from stand       Functional mobility during ADLs: Minimal assistance;Rolling walker General ADL Comments: fatigues easily     Vision       Perception     Praxis      Cognition Arousal/Alertness: Awake/alert Behavior During Therapy: WFL for tasks assessed/performed Overall Cognitive Status: Impaired/Different from baseline Area of Impairment: Problem solving                             Problem Solving: Slow processing;Decreased initiation;Difficulty sequencing;Requires verbal cues          Exercises     Shoulder Instructions       General Comments      Pertinent Vitals/ Pain       Pain Assessment: No/denies pain  Home Living                                          Prior Functioning/Environment              Frequency  Min 2X/week        Progress Toward Goals  OT Goals(current goals can now be found in the care plan section)  Progress towards OT goals: Progressing toward goals  Acute Rehab OT Goals Patient Stated Goal: to go to rehab near his home OT Goal Formulation: With patient Time For Goal Achievement: 10/23/16 Potential to Achieve Goals: Good  Plan Discharge plan needs to be updated    Co-evaluation                  AM-PAC PT "6 Clicks" Daily Activity     Outcome Measure   Help from another person eating meals?: None Help from another person taking care of personal grooming?: A Little Help from another person toileting, which includes using toliet, bedpan, or urinal?: A Little Help from another person bathing (including washing, rinsing, drying)?: A Little Help from another person to put on and taking off regular upper body clothing?: None Help from another person to put on and taking off regular lower body clothing?: A Little 6 Click Score: 20    End of Session Equipment Utilized During Treatment: Gait belt;Rolling walker  OT Visit Diagnosis: Unsteadiness on feet (R26.81)   Activity Tolerance Patient tolerated treatment well   Patient Left in chair;with call bell/phone within reach   Nurse Communication          Time: 7209-4709 OT Time Calculation (min): 44 min  Charges: OT General Charges $OT Visit: 1 Procedure OT Treatments $Self Care/Home Management : 38-52 mins   Malka So 10/13/2016, 11:30 AM  [phone]

## 2016-10-13 NOTE — Discharge Instructions (Signed)
Aortic Valve Replacement, Care After °Refer to this sheet in the next few weeks. These instructions provide you with information about caring for yourself after your procedure. Your health care provider may also give you more specific instructions. Your treatment has been planned according to current medical practices, but problems sometimes occur. Call your health care provider if you have any problems or questions after your procedure. °What can I expect after the procedure? °After the procedure, it is common to have: °· Pain around your incision area. °· A small amount of blood or clear fluid coming from your incision. ° °Follow these instructions at home: °Eating and drinking ° °· Follow instructions from your health care provider about eating or drinking restrictions. °? Limit alcohol intake to no more than 1 drink per day for nonpregnant women and 2 drinks per day for men. One drink equals 12 oz of beer, 5 oz of wine, or 1½ oz of hard liquor. °? Limit how much caffeine you drink. Caffeine can affect your heart's rate and rhythm. °· Drink enough fluid to keep your urine clear or pale yellow. °· Eat a heart-healthy diet. This should include plenty of fresh fruits and vegetables. If you eat meat, it should be lean cuts. Avoid foods that are: °? High in salt, saturated fat, or sugar. °? Canned or highly processed. °? Fried. °Activity °· Return to your normal activities as told by your health care provider. Ask your health care provider what activities are safe for you. °· Exercise regularly once you have recovered, as told by your health care provider. °· Avoid sitting for more than 2 hours at a time without moving. Get up and move around at least once every 1-2 hours. This helps to prevent blood clots in the legs. °· Do not lift anything that is heavier than 10 lb (4.5 kg) until your health care provider approves. °· Avoid pushing or pulling things with your arms until your health care provider approves. This  includes pulling on handrails to help you climb stairs. °Incision care ° °· Follow instructions from your health care provider about how to take care of your incision. Make sure you: °? Wash your hands with soap and water before you change your bandage (dressing). If soap and water are not available, use hand sanitizer. °? Change your dressing as told by your health care provider. °? Leave stitches (sutures), skin glue, or adhesive strips in place. These skin closures may need to stay in place for 2 weeks or longer. If adhesive strip edges start to loosen and curl up, you may trim the loose edges. Do not remove adhesive strips completely unless your health care provider tells you to do that. °· Check your incision area every day for signs of infection. Check for: °? More redness, swelling, or pain. °? More fluid or blood. °? Warmth. °? Pus or a bad smell. °Medicines °· Take over-the-counter and prescription medicines only as told by your health care provider. °· If you were prescribed an antibiotic medicine, take it as told by your health care provider. Do not stop taking the antibiotic even if you start to feel better. °Travel °· Avoid airplane travel for as long as told by your health care provider. °· When you travel, bring a list of your medicines and a record of your medical history with you. Carry your medicines with you. °Driving °· Ask your health care provider when it is safe for you to drive. Do not drive until your health   care provider approves. °· Do not drive or operate heavy machinery while taking prescription pain medicine. °Lifestyle ° °· Do not use any tobacco products, such as cigarettes, chewing tobacco, or e-cigarettes. If you need help quitting, ask your health care provider. °· Resume sexual activity as told by your health care provider. Do not use medicines for erectile dysfunction unless your health care provider approves, if this applies. °· Work with your health care provider to keep your  blood pressure and cholesterol under control, and to manage any other heart conditions that you have. °· Maintain a healthy weight. °General instructions °· Do not take baths, swim, or use a hot tub until your health care provider approves. °· Do not strain to have a bowel movement. °· Avoid crossing your legs while sitting down. °· Check your temperature every day for a fever. A fever may be a sign of infection. °· If you are a woman and you plan to become pregnant, talk with your health care provider before you become pregnant. °· Wear compression stockings if your health care provider instructs you to do this. These stockings help to prevent blood clots and reduce swelling in your legs. °· Tell all health care providers who care for you that you have an artificial (prosthetic) aortic valve. If you have or have had heart disease or endocarditis, tell all health care providers about these conditions as well. °· Keep all follow-up visits as told by your health care provider. This is important. °Contact a health care provider if: °· You develop a skin rash. °· You experience sudden, unexplained changes in your weight. °· You have more redness, swelling, or pain around your incision. °· You have more fluid or blood coming from your incision. °· Your incision feels warm to the touch. °· You have pus or a bad smell coming from your incision. °· You have a fever. °Get help right away if: °· You develop chest pain that is different from the pain coming from your incision. °· You develop shortness of breath or difficulty breathing. °· You start to feel light-headed. °These symptoms may represent a serious problem that is an emergency. Do not wait to see if the symptoms will go away. Get medical help right away. Call your local emergency services (911 in the [place] Do not drive yourself to the hospital. °This information is not intended to replace advice given to you by your health care provider. Make sure you discuss any  questions you have with your health care provider. °Document Released: 10/16/2004 Document Revised: 09/05/2015 Document Reviewed: 03/03/2015 °Elsevier Interactive Patient Education © 2017 [employer] ° °Coronary Artery Bypass Grafting, Care After °These instructions give you information on caring for yourself after your procedure. Your doctor may also give you more specific instructions. Call your doctor if you have any problems or questions after your procedure. °Follow these instructions at home: °· Only take medicine as told by your doctor. Take medicines exactly as told. Do not stop taking medicines or start any new medicines without talking to your doctor first. °· Take your pulse as told by your doctor. °· Do deep breathing as told by your doctor. Use your breathing device (incentive spirometer), if given, to practice deep breathing several times a day. Support your chest with a pillow or your arms when you take deep breaths or cough. °· Keep the area clean, dry, and protected where the surgery cuts (incisions) were made. Remove bandages (dressings) only as told by your doctor. If   strips were applied to surgical area, do not take them off. They fall off on their own. °· Check the surgery area daily for puffiness (swelling), redness, or leaking fluid. °· If surgery cuts were made in your legs: °? Avoid crossing your legs. °? Avoid sitting for long periods of time. Change positions every 30 minutes. °? Raise your legs when you are sitting. Place them on pillows. °· Wear stockings that help keep blood clots from forming in your legs (compression stockings). °· Only take sponge baths until your doctor says it is okay to take showers. Pat the surgery area dry. Do not rub the surgery area with a washcloth or towel. Do not bathe, swim, or use a hot tub until your doctor says it is okay. °· Eat foods that are high in fiber. These include raw fruits and vegetables, whole grains, beans, and nuts. Choose lean meats.  Avoid canned, processed, and fried foods. °· Drink enough fluids to keep your pee (urine) clear or pale yellow. °· Weigh yourself every day. °· Rest and limit activity as told by your doctor. You may be told to: °? Stop any activity if you have chest pain, shortness of breath, changes in heartbeat, or dizziness. Get help right away if this happens. °? Move around often for short amounts of time or take short walks as told by your doctor. Gradually become more active. You may need help to strengthen your muscles and build endurance. °? Avoid lifting, pushing, or pulling anything heavier than 10 pounds (4.5 kg) for at least 6 weeks after surgery. °· Do not drive until your doctor says it is okay. °· Ask your doctor when you can go back to work. °· Ask your doctor when you can begin sexual activity again. °· Follow up with your doctor as told. °Contact a doctor if: °· You have puffiness, redness, more pain, or fluid draining from the incision site. °· You have a fever. °· You have puffiness in your ankles or legs. °· You have pain in your legs. °· You gain 2 or more pounds (0.9 kg) a day. °· You feel sick to your stomach (nauseous) or throw up (vomit). °· You have watery poop (diarrhea). °Get help right away if: °· You have chest pain that goes to your jaw or arms. °· You have shortness of breath. °· You have a fast or irregular heartbeat. °· You notice a "clicking" in your breastbone when you move. °· You have numbness or weakness in your arms or legs. °· You feel dizzy or light-headed. °This information is not intended to replace advice given to you by your health care provider. Make sure you discuss any questions you have with your health care provider. °Document Released: 04/04/2013 Document Revised: 09/05/2015 Document Reviewed: 09/06/2012 °Elsevier Interactive Patient Education © 2017 [employer] ° °

## 2016-10-13 NOTE — Progress Notes (Signed)
CSW spoke with pt and pt wife about bed offers- patient first choice of [facility] cannot accept because they are not currently trained to take life vest  CSW provided alternative options that can accommodate life vest and IV antibiotics- patient chooses [facility] [place]- hopeful for DC tomorrow if pt is stable- Candace Cruise initiated for anticipated DC tomorrow to SNF- confirmed SNF can accept on the [place], [place] Social Worker [phone]

## 2016-10-13 NOTE — Progress Notes (Signed)
Peripherally Inserted Central Catheter exchanged  The IV Nurse has discussed with the patient and/or persons authorized to consent for the patient, the purpose of this procedure and the potential benefits and risks involved with this procedure.  The benefits include less needle sticks, lab draws from the catheter, and the patient may be discharged home with the catheter. Risks include, but not limited to, infection, bleeding, blood clot (thrombus formation), and puncture of an artery; nerve damage and irregular heartbeat and possibility to perform a PICC exchange if needed/ordered by physician.  Alternatives to this procedure were also discussed.  Bard Power PICC patient education guide, fact sheet on infection prevention and patient information card has been provided to patient /or left at bedside.    PICC/Midline Placement Documentation  PICC Single Lumen 63/89/37 PICC Right Basilic 46 cm 0 cm (Active)  Indication for Insertion or Continuance of Line Home intravenous therapies (PICC only) 10/13/2016  9:00 PM  Exposed Catheter (cm) 0 cm 10/13/2016  9:00 PM  Site Assessment Clean;Dry;Intact 10/13/2016  9:00 PM  Line Status Flushed;Saline locked;Blood return noted 10/13/2016  9:00 PM  Dressing Type Transparent 10/13/2016  9:00 PM  Dressing Status Clean;Dry;Intact 10/13/2016  9:00 PM  Dressing Change Due 10/20/16 10/13/2016  9:00 PM       [facility] Payment 10/13/2016, 9:27 PM

## 2016-10-13 NOTE — Progress Notes (Signed)
Electrophysiology Rounding Note  Patient Name: Charles [name] Date of Encounter: 10/13/2016  Primary Cardiologist: Domenic Polite Electrophysiologist: Jazira Maloney   Subjective   The patient is doing well today.  At this time, the patient denies chest pain, shortness of breath, or any new concerns. Walked without dizziness yesterday, still very weak.  Inpatient Medications    Scheduled Meds: . amiodarone  400 mg Oral BID  . aspirin EC  325 mg Oral Daily  . bisacodyl  10 mg Oral Daily   Or  . bisacodyl  10 mg Rectal Daily  . Chlorhexidine Gluconate Cloth  6 each Topical Daily  . docusate sodium  200 mg Oral Daily  . enoxaparin (LOVENOX) injection  30 mg Subcutaneous Q24H  . ferrous sulfate  325 mg Oral Daily  . folic acid-pyridoxine-cyancobalamin  1 tablet Oral Daily  . furosemide  40 mg Oral Daily  . metoprolol tartrate  25 mg Oral Q8H  . mexiletine  200 mg Oral Q8H  . moving right along book   Does not apply Once  . pantoprazole  40 mg Oral Daily  . potassium chloride  20 mEq Oral BID  . rosuvastatin  10 mg Oral Daily  . sodium chloride flush  3 mL Intravenous Q12H   Continuous Infusions: . sodium chloride    . ampicillin-sulbactam (UNASYN) IV Stopped (10/13/16 2751)   PRN Meds: sodium chloride, alum & mag hydroxide-simeth, magnesium hydroxide, ondansetron (ZOFRAN) IV, oxyCODONE, sodium chloride flush, traMADol   Vital Signs    Vitals:   10/12/16 1200 10/12/16 1309 10/12/16 2202 10/13/16 0600  BP: 103/69 115/61 131/77 125/73  Pulse: 62 65 (!) 59 62  Resp: (!) 25 18 18 18   Temp:  98.2 F (36.8 C) 98.1 F (36.7 C) 98.6 F (37 C)  TempSrc:  Oral Oral Oral  SpO2: 96% 95% 96% 95%  Weight:    232 lb 12.8 oz (105.6 kg)  Height:        Intake/Output Summary (Last 24 hours) at 10/13/16 0756 Last data filed at 10/13/16 7001  Gross per 24 hour  Intake              700 ml  Output              750 ml  Net              -50 ml   Filed Weights   10/11/16 0300 10/12/16 0600  10/13/16 0600  Weight: 233 lb 4.8 oz (105.8 kg) 233 lb 6.4 oz (105.9 kg) 232 lb 12.8 oz (105.6 kg)    Physical Exam    GEN- The patient is well appearing, alert and oriented x 3 today.   Head- normocephalic, atraumatic Eyes-  Sclera clear, conjunctiva pink Ears- hearing intact Oropharynx- clear Neck- supple Lungs- Clear to ausculation bilaterally, normal work of breathing Heart- Regular rate and rhythm  GI- soft, NT, ND, + BS Extremities- no clubbing, cyanosis,+dependent BLE edema Skin- no rash or lesion Psych- euthymic mood, full affect Neuro- strength and sensation are intact  Labs    Basic Metabolic Panel  Recent Labs  10/13/16 0404  NA 140  K 3.7  CL 107  CO2 26  GLUCOSE 90  BUN 17  CREATININE 1.51*  CALCIUM 7.9*     Telemetry    Sinus rhythm (personally reviewed)  Radiology    No results found.   Patient Profile      71 y.o. 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 Follow-up echocardiogram 40-45% range. Subsequent cardiac catheterization shows stable coronary anatomy and patent vein graft. Rhythm has been stable on Amiodarone and Mexiletine  Assessment & Plan    1.  PMVT/VF No recurrent arrhythmias on amiodarone and mexiletine Continue amiodarone 400mg  bid Continue Mexiletine 200mg  q8 hours until discharge then 200mg  bid LifeVest ordered and will be fitted today or tomorrow No driving x6 months Keep K >3.9, Mg >1.8  2.  S/p AVR/CABG Per TCTS  3.  Endocarditis Plan per ID 6 weeks of Unasyn    Signed, Chanetta Marshall, NP  10/13/2016, 7:56 AM    I have seen, examined the patient, and reviewed the above assessment and plan.  On exam, RRR.  Changes to above are made where necessary.  Continue current plan.  lifevest has been  arranged for discharged.  Could discharge tomorrow or Thursday from EP standpoint.  Co Sign: Thompson Grayer, MD 10/13/2016 10:01 AM

## 2016-10-13 NOTE — Discharge Summary (Signed)
Physician Discharge Summary  Patient ID: THEOPHILE HARVIE MRN: [id number] DOB/AGE: January 23, 1946 70 y.o.  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 CHF (congestive heart failure) ([place])   . S/P AVR   . Acute blood loss anemia   . Tachypnea   . Ventricular tachycardia ([place])   . 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 S/P patent foramen ovale closure 10/02/2016  . S/P aortic valve allograft 10/02/2016  . Insomnia 09/27/2016  . Acute diastolic CHF (congestive heart failure), NYHA class 4 ([place]) 09/25/2016  . CHF (congestive heart failure), NYHA class II ([place]) 09/24/2016  . Aortic valve disorder   . Coronary artery disease involving native heart without angina pectoris   . Second degree AV block, Mobitz type I 07/08/2016  . Weight loss   . Bacterial endocarditis   . Protein-calorie malnutrition, severe 07/03/2016  . Anemia 07/02/2016  . H/O cardiac murmur 07/02/2016  . Ascending aortic aneurysm ([place]) 07/02/2016  . NSTEMI (non-ST elevated myocardial infarction) ([place]) 07/02/2016  . Bicuspid aortic valve 07/02/2016  . Aortic insufficiency 07/02/2016    HPI: At time of consultation  Patient is a 71 year old moderately obese white male with known history  of heart murmur dating back several years who was recently hospitalized for Streptococcus viridanssubacute bacterial endocarditis. I had the opportunity to evaluate the patient in consultation during his hospitalization on 07/04/2016. 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 The jet of aortic insufficiency was quite eccentric and there was questions regarding the possibility of leaflet perforation. CT angiogram of the chest confirmed the presence of moderate aneurysmal enlargement of the aorta with maximum transverse diameter 4.4cm. The patient did not have symptoms or signs of congestive heart failure at the time and he responded well to medical therapy for endocarditis. He completed his course of intravenous antibiotics as an outpatient and was seen in follow-up by Dr. Megan Salon from infectious disease and Dr. Domenic Polite in cardiology. He underwent diagnostic cardiac catheterization on 08/03/2016 revealing the presence of 70% ostial stenosis of the right coronary artery that was felt to be severe because of the degree of dampening pressure with catheter engagement. There was mild nonobstructive coronary artery disease involving the left anterior descending coronary artery and left circumflex coronary artery. Left ventricular end-diastolic pressure was mildly elevated at 24 mmHg. The patient recently underwent dental extraction by Dr. Enrique Sack and he returned to our office for follow-up on 08/17/2016.  At that point the patient claims to be essentially asymptomatic and remained reluctant to consider proceeding with elective aortic valve replacement and coronary artery bypass grafting, despite the fact that  there were concerns regarding the possibility that he may have perforation of his aortic valve with questions about  the chronicity of aortic insufficiency. He decided to seek a second opinion and apparently was evaluated in the office by Dr. Einar Gip who also encouraged him to consider proceeding with surgery.  Medications were adjusted at that time and apparently the patient was taken off of Lasix. Since then he developed worsening symptoms of shortness of breath and lower extremity edema. Symptoms progressed until 09/24/2016 when he presented acutely to the emergency room at [facility] with resting shortness of breath.  He also reported some symptoms of burning substernal chest pain that occur only with activity. Troponin levels were mildly elevated and portable chest x-ray revealed congestive heart failure.  The patient was admitted to [facility] for further management. He has since then improved with medical therapy and follow up cardiothoracic surgical consultation was requested.  Discharged Condition: good  Hospital Course: The patient was admitted to the emergency department with evidence of congestive heart failure. He was medically stabilized and diuresed. He was previously seen by Dr. Roxy Manns in cardiothoracic surgical consult and was reconsulted this admission and felt to be a candidate to proceed with surgery during this hospitalization.  On 10/02/2016 he was taken to the operating room where he underwent the below described procedure. He tolerated well was taken to the surgical intensive care unit in stable condition. He was able to be weaned from the vent on postop day 1. He required some inotropic support as well as a nearly postoperative period but this was able to be weaned without significant difficulty over time. Additionally he did develop atrial fibrillation in the early postoperative period and started on amiodarone. He had some significant postoperative volume overload but has responded well to diuretics. Initially she was started on a Lasix drip. A PICC line has been inserted  for long term antibiotics and infectious disease has assisted with management. Chest tube removed over time as drainage decreases. On 10/06/2016 the patient developed sustained ventricular tachycardia requiring multiple DCCV shocks. This recurred on several occasions. Electrophysiology consultation was obtained to assist with management. Patient was started on intravenous lidocaine as well as the amiodarone. Echocardiogram was repeated and ejection fraction was noted to be lower into the 40-45 percent range with global hypokinesis more severe and posterolateral hypokinesis/dyskinesis. Overall VVI pacing was initiated. He was determined the patient should undergo repeat cardiac catheterization to evaluate for ischemia especially as related to the right coronary artery. This was done in the right-sided PDA graft was completely open. No further ischemic workup was felt to be required. Over time the patient's rhythm has stabilized. He will require a life vest at time of discharge. His incisions are healing well without evidence of infection. He is tolerating gradually increasing activities but will benefit from a further rehabilitation stay and this is being scheduled at time of discharge in the setting was skilled nursing facility. He is still to be stable for discharge to this facility in the next day or so depending on his ongoing recovery.  Postoperative hospital course  The patient initially had some difficulty with weaning from the ventilator and required aggressive recruiting maneuvers  Consults: cardiology, ID and EP  Significant Diagnostic Studies: cardiac cath  Treatments: surgery:  10/02/2016 4:17 PM      [] Hide copied text CARDIOTHORACIC SURGERY OPERATIVE NOTE  Date of Procedure:  10/02/2016  Preoperative Diagnosis:       ? Bacterial Endocarditis ? Severe Aortic Insufficiency ? Severe Single-vessel Coronary Artery Disease ? Ascending Thoracic Aortic  Aneurysm  Postoperative Diagnosis:     ? Bacterial Endocarditis ? Severe Aortic Insufficiency ? 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 Severe Single-vessel Coronary Artery Disease ? Ascending Thoracic Aortic Aneurysm ? Patent Foramen Ovale  Procedure:        Aortic Root Replacement             Human allograft aortic root graft (size 45mm, [employer] ID # [id number])             Repair of false aneurysms of aortic root x2             Reimplantation of left main coronary artery             Resection and grafting of ascending thoracic aortic aneurysm   Coronary Artery Bypass Grafting x 2             Reversed Greater Saphenous Vein Graft to Distal Right Coronary Artery             Endoscopic Vein Harvest from Right Thigh   Closure of Patent Foramen Ovale   Surgeon:        Valentina Gu. 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 rate 18, height 6' (1.829 m), weight 236 lb 9.6 oz (107.3 kg), SpO2 93 %.  General appearance: alert, cooperative and no distress Heart: regular rate and rhythm Lungs: clear to auscultation bilaterally Abdomen: benign Extremities:  + edema Wound: incis healing well  Disposition: 01-Home or Self Care  Discharge Instructions    Amb Referral to Cardiac Rehabilitation    Complete by:  As directed    Diagnosis:  CABG Comment - aortic root replacement   CABG X ___:  1     Allergies as of 10/15/2016      Reactions   No Known Allergies       Medication List    STOP taking these medications   acetaminophen 325 MG tablet Commonly known as:  TYLENOL   chlorhexidine 0.12 % solution Commonly known as:  PERIDEX   losartan-hydrochlorothiazide 50-12.5 MG tablet Commonly known as:  HYZAAR   nitroGLYCERIN 0.4 MG SL tablet Commonly known as:  NITROSTAT     TAKE these medications   amiodarone 200 MG tablet Commonly known as:  PACERONE Take 1 tablet (200 mg total) by mouth 2 (two) times daily.   Ampicillin-Sulbactam 3 g in sodium chloride 0.9 % 100 mL Inject 3 g into the vein every 6 (six) hours.   aspirin 325 MG EC tablet Take 1 tablet (325 mg total) by mouth daily.  What changed:  medication strength  how much to take   ferrous sulfate 325 (65 FE) MG tablet Take 1 tablet (325 mg total) by mouth daily. What changed:  medication strength  how much to take   furosemide 40 MG tablet Commonly known as:  LASIX Take 1 tablet (40 mg total) by mouth daily. What changed:  when to take this  reasons to take this   metoprolol tartrate 25 MG tablet Commonly known as:  LOPRESSOR Take 1 tablet (25 mg total) by mouth 2 (two) times daily. Hold for HR less than 50   mexiletine 200 MG capsule Commonly known as:  MEXITIL Take 1 capsule (200 mg total) by mouth every 8 (eight) hours.   multivitamin with minerals Tabs tablet Take 1 tablet by mouth daily.   oxyCODONE 5 MG immediate release tablet Commonly known as:  Oxy IR/ROXICODONE Take 1-2 tablets (5-10 mg total) by mouth every 6 (six) hours as needed for severe pain.   potassium chloride SA 20 MEQ tablet Commonly known as:  K-DUR,KLOR-CON Take 1 tablet  (20 mEq total) by mouth daily.   rosuvastatin 10 MG tablet Commonly known as:  CRESTOR Take 1 tablet by mouth daily.      Follow-up Information    Thompson Grayer, MD Follow up on 11/02/2016.   Specialty:  Cardiology Why:  at 12:30PM  Contact information: [address] [place] [address] [phone]        Satira Sark, MD Follow up.   Specialty:  Cardiology Why:  see discharge paperwork for 2 week follow up appointment Contact information: [place] [address] [phone]        [name], MD Follow up.   Specialty:  Cardiothoracic Surgery Why:  see discharge paperwork. Also obtain a chest x-ray [facility] imaging one half hour prior to the appointment. [facility] is located in the same office complex. Contact information: [address] [place] [address] [phone]         The patient has been discharged on:   1.Beta Blocker:  Yes [ y  ]                              No   [   ]                              If No, reason:  2.Ace Inhibitor/ARB: Yes [   ]                                     No  [    ]                                     If No, reason:no clinical role currently, and increased creat  3.Statin:   Yes [ y  ]                  No  [   ]                  If No, reason:  4.Ecasa:  Yes  [ y  ]  No   [   ]                  If No, reason:  1. Please obtain vital signs at least one time daily 2.Please weigh the patient daily. If he or she continues to gain weight or develops lower extremity edema, contact the office at [phone]. 3. Ambulate patient at least three times daily and please use sternal precautions.    SignedJadene Pierini E 10/15/2016, 7:38 AM

## 2016-10-13 NOTE — Care Management Important Message (Signed)
Important Message  Patient Details  Name: Charles [name] MRN: [id number] Date of Birth: January 02, 1946   Medicare Important Message Given:  Yes    Chelcy Bolda Abena 10/13/2016, 10:16 AM

## 2016-10-13 NOTE — Progress Notes (Signed)
[id number] Pt just back to bed and wanting to sleep. Reviewed sternal precautions with pt and the importance of IS. Gave written ex ed and heart healthy diet. Encouraged him to have wife read later. Discussed CRP 2 and will refer to [place]. Pt voiced understanding of ed. Seems to comprehend better today than with our walk yesterday. Graylon Good RN BSN 10/13/2016 2:55 PM

## 2016-10-13 NOTE — Progress Notes (Addendum)
[address]       [place] [address]             [phone]      6 Days Post-Op Procedure(s) (LRB): Left Heart Cath and Cors/Grafts Angiography (N/A) Subjective: Feels ok, some constipation. Slowly getting stronger  Objective: Vital signs in last 24 hours: Temp:  [98 F (36.7 C)-98.6 F (37 C)] 98.6 F (37 C) (07/03 0600) Pulse Rate:  [59-65] 62 (07/03 0600) Cardiac Rhythm: Sinus bradycardia;Heart block (07/02 1900) Resp:  [18-25] 18 (07/03 0600) BP: (101-131)/(61-77) 125/73 (07/03 0600) SpO2:  [92 %-96 %] 95 % (07/03 0600) Weight:  [232 lb 12.8 oz (105.6 kg)] 232 lb 12.8 oz (105.6 kg) (07/03 0600)  Hemodynamic parameters for last 24 hours:    Intake/Output from previous day: 07/02 0701 - 07/03 0700 In: 900 [P.O.:240; I.V.:60; IV Piggyback:600] Out: 750 [Urine:750] Intake/Output this shift: No intake/output data recorded.  General appearance: alert, cooperative and no distress Heart: regular rate and rhythm Lungs: mildly dim in bases Abdomen: benign Extremities: + LE edema Wound: incis healing well  Lab Results:  Recent Labs  10/13/16 0404  WBC 8.7  HGB 8.3*  HCT 26.8*  PLT 369   BMET:  Recent Labs  10/13/16 0404  NA 140  K 3.7  CL 107  CO2 26  GLUCOSE 90  BUN 17  CREATININE 1.51*  CALCIUM 7.9*    PT/INR: No results for input(s): LABPROT, INR in the last 72 hours. ABG    Component Value Date/Time   PHART 7.499 (H) 10/07/2016 0457   HCO3 34.6 (H) 10/07/2016 0457   TCO2 36 10/07/2016 0457   ACIDBASEDEF 1.0 10/02/2016 2329   O2SAT 65.4 10/08/2016 0450   CBG (last 3)  No results for input(s): GLUCAP in the last 72 hours.  Meds Scheduled Meds: . amiodarone  400 mg Oral BID  . aspirin EC  325 mg Oral Daily  . bisacodyl  10 mg Oral Daily   Or  . bisacodyl  10 mg Rectal Daily  . Chlorhexidine Gluconate Cloth  6 each Topical Daily  . docusate sodium  200 mg Oral Daily  . enoxaparin (LOVENOX) injection  30 mg  Subcutaneous Q24H  . ferrous sulfate  325 mg Oral Daily  . folic acid-pyridoxine-cyancobalamin  1 tablet Oral Daily  . furosemide  40 mg Oral Daily  . metoprolol tartrate  25 mg Oral Q8H  . mexiletine  200 mg Oral Q8H  . moving right along book   Does not apply Once  . pantoprazole  40 mg Oral Daily  . potassium chloride  20 mEq Oral BID  . rosuvastatin  10 mg Oral Daily  . sodium chloride flush  3 mL Intravenous Q12H   Continuous Infusions: . sodium chloride    . ampicillin-sulbactam (UNASYN) IV Stopped (10/13/16 5852)   PRN Meds:.sodium chloride, alum & mag hydroxide-simeth, magnesium hydroxide, ondansetron (ZOFRAN) IV, oxyCODONE, sodium chloride flush, traMADol  Xrays No results found.  Assessment/Plan: S/P Procedure(s) (LRB): Left Heart Cath and Cors/Grafts Angiography (N/A)  1 doing well overall with steady progress 2 rhythm management as per EP, for lifevest at discharge from hospital- he is hoping to go to rehab in [place]- ok to d/c wires  3 Unasyn as outlined 4 cont diuretics- but creat going up, will have to monitor closely, K+ replacement ordered, BP pretty well controlled- no ace-I/ARB 5 H/H pretty stable   LOS: 19 days    GOLD,WAYNE E  10/13/2016 Patient seen and examined, agree with above  Remo Lipps C. Roxan Hockey, MD [facility] [phone]

## 2016-10-14 DIAGNOSIS — R001 Bradycardia, unspecified: Secondary | ICD-10-CM

## 2016-10-14 LAB — BASIC METABOLIC PANEL
Anion gap: 9 (ref 5–15)
BUN: 18 mg/dL (ref 6–20)
CO2: 22 mmol/L (ref 22–32)
Calcium: 8.3 mg/dL — ABNORMAL LOW (ref 8.9–10.3)
Chloride: 109 mmol/L (ref 101–111)
Creatinine, Ser: 1.42 mg/dL — ABNORMAL HIGH (ref 0.61–1.24)
GFR calc Af Amer: 57 mL/min — ABNORMAL LOW (ref >60)
GFR calc non Af Amer: 49 mL/min — ABNORMAL LOW (ref >60)
GLUCOSE: 87 mg/dL (ref 65–99)
POTASSIUM: 4.1 mmol/L (ref 3.5–5.1)
Sodium: 140 mmol/L (ref 135–145)

## 2016-10-14 MED ORDER — AMIODARONE HCL 200 MG PO TABS
200.0000 mg | ORAL_TABLET | Freq: Two times a day (BID) | ORAL | Status: DC
  Administered 2016-10-14 – 2016-10-15 (×3): 200 mg via ORAL
  Filled 2016-10-14 (×3): qty 1

## 2016-10-14 MED ORDER — METOPROLOL TARTRATE 25 MG PO TABS
25.0000 mg | ORAL_TABLET | Freq: Two times a day (BID) | ORAL | Status: DC
  Administered 2016-10-14 – 2016-10-15 (×2): 25 mg via ORAL
  Filled 2016-10-14 (×2): qty 1

## 2016-10-14 NOTE — Progress Notes (Addendum)
      [address]       [place] [address]             [phone]        7 Days Post-Op Procedure(s) (LRB): Left Heart Cath and Cors/Grafts Angiography (N/A)  Subjective: Patient without complaints. He states he is ready to go to SNF.  Objective: Vital signs in last 24 hours: Temp:  [97.9 F (36.6 C)-98.5 F (36.9 C)] 97.9 F (36.6 C) (07/04 0425) Pulse Rate:  [58-59] 58 (07/04 0425) Cardiac Rhythm: Heart block;Sinus bradycardia;Bundle branch block (07/04 0700) Resp:  [18] 18 (07/04 0425) BP: (117-136)/(65-68) 136/68 (07/04 0425) SpO2:  [93 %-95 %] 95 % (07/04 0425) Weight:  [106 kg (233 lb 11.2 oz)] 106 kg (233 lb 11.2 oz) (07/04 0425)  Pre op weight 103.4 kg Current Weight  10/14/16 106 kg (233 lb 11.2 oz)      Intake/Output from previous day: 07/03 0701 - 07/04 0700 In: 100 [IV Piggyback:100] Out: 550 [Urine:550]   Physical Exam:  Cardiovascular: RRR, no murmur Pulmonary: Slightly diminished at bases Abdomen: Soft, non tender, bowel sounds present. Extremities: Bilateral lower extremity edema. Wounds: Clean and dry.  No erythema or signs of infection.  Lab Results: CBC: Recent Labs  10/13/16 0404  WBC 8.7  HGB 8.3*  HCT 26.8*  PLT 369   BMET:  Recent Labs  10/13/16 0404 10/14/16 0202  NA 140 140  K 3.7 4.1  CL 107 109  CO2 26 22  GLUCOSE 90 87  BUN 17 18  CREATININE 1.51* 1.42*  CALCIUM 7.9* 8.3*    PT/INR:  Lab Results  Component Value Date   INR 1.74 10/02/2016   INR 1.93 10/02/2016   INR 1.10 09/29/2016   ABG:  INR: Will add last result for INR, ABG once components are confirmed Will add last 4 CBG results once components are confirmed  Assessment/Plan:  1. CV - PMVT, first degree heart block. On Amiodarone 400 mg bid, Lopressor 25 mg tid, and Mexiletine 200 mg tid. Per Dr. Rayann Heman, Life Vest has been arranged. HR in the 50's of late. Will decrease Amiodarone and Lopressor. Per Dr. Rayann Heman, not symptomatic  from bradycardia and should improve with time. He does not want medications above held. 2.  Pulmonary - On room air. Encourage incentive spirometer. 3. Volume Overload - On Lasix 40 mg daily 4.  Acute blood loss anemia - Last H and H 8.3 and 26.8.Continue Ferrous sulfate and Foltx. 5. 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 Per infectious disease, he needs a total of 6 weeks of IV Unasyn. 7. Creatinine slightly decreased from 1.51 to 1.42 8. As discussed with Dr. Servando Snare, likely to SNF in am  ZIMMERMAN,DONIELLE MPA-C 10/14/2016,7:16 AM     plan SNF in am if heart rate stable  I have seen and examined Cathleen Corti and agree with the above assessment  and plan.  Grace Isaac MD [facility] [phone] Office [phone] 10/14/2016 11:50 AM

## 2016-10-14 NOTE — Progress Notes (Signed)
Electrophysiology Rounding Note  Patient Name: Charles [name] Date of Encounter: 10/14/2016   Electrophysiologist: Dr Rayann Heman   Subjective   The patient is doing well today.  At this time, the patient denies chest pain, shortness of breath, or any new concerns.  He is walking with PT and ready to go home.  No further arrhythmias.  No symptoms with sinus bradycardia.  Inpatient Medications    Scheduled Meds: . amiodarone  200 mg Oral BID  . aspirin EC  325 mg Oral Daily  . bisacodyl  10 mg Oral Daily   Or  . bisacodyl  10 mg Rectal Daily  . Chlorhexidine Gluconate Cloth  6 each Topical Daily  . docusate sodium  200 mg Oral Daily  . enoxaparin (LOVENOX) injection  30 mg Subcutaneous Q24H  . ferrous sulfate  325 mg Oral Daily  . folic acid-pyridoxine-cyancobalamin  1 tablet Oral Daily  . furosemide  40 mg Oral Daily  . metoprolol tartrate  25 mg Oral BID  . mexiletine  200 mg Oral Q8H  . moving right along book   Does not apply Once  . pantoprazole  40 mg Oral Daily  . potassium chloride  20 mEq Oral BID  . rosuvastatin  10 mg Oral Daily  . sodium chloride flush  3 mL Intravenous Q12H   Continuous Infusions: . sodium chloride    . ampicillin-sulbactam (UNASYN) IV Stopped (10/14/16 0547)   PRN Meds: sodium chloride, alum & mag hydroxide-simeth, magnesium hydroxide, ondansetron (ZOFRAN) IV, oxyCODONE, sodium chloride flush, traMADol   Vital Signs    Vitals:   10/13/16 0600 10/13/16 1445 10/13/16 1941 10/14/16 0425  BP: 125/73 117/67 130/65 136/68  Pulse: 62 (!) 59 (!) 59 (!) 58  Resp: 18 18 18 18   Temp: 98.6 F (37 C) 98.5 F (36.9 C) 98 F (36.7 C) 97.9 F (36.6 C)  TempSrc: Oral Oral Oral Oral  SpO2: 95% 93% 95% 95%  Weight: 232 lb 12.8 oz (105.6 kg)   233 lb 11.2 oz (106 kg)  Height:        Intake/Output Summary (Last 24 hours) at 10/14/16 0950 Last data filed at 10/13/16 1800  Gross per 24 hour  Intake              100 ml  Output              550 ml    Net             -450 ml   Filed Weights   10/12/16 0600 10/13/16 0600 10/14/16 0425  Weight: 233 lb 6.4 oz (105.9 kg) 232 lb 12.8 oz (105.6 kg) 233 lb 11.2 oz (106 kg)    Physical Exam    GEN- The patient is less ill appearing, alert and oriented x 3 today.   Head- normocephalic, atraumatic Eyes-  Sclera clear, conjunctiva pink Ears- hearing intact Oropharynx- clear Neck- supple Lungs- normal work of breathing Heart- Regular rate and rhythm  GI- soft  Extremities- no clubbing, cyanosis  Skin- no rash or lesion Psych- euthymic mood, full affect Neuro- moves extremities well  Labs    CBC  Recent Labs  10/13/16 0404  WBC 8.7  HGB 8.3*  HCT 26.8*  MCV 83.8  PLT 944   Basic Metabolic Panel  Recent Labs  10/13/16 0404 10/14/16 0202  NA 140 140  K 3.7 4.1  CL 107 109  CO2 26 22  GLUCOSE 90 87  BUN 17 18  CREATININE 1.51* 1.42*  CALCIUM 7.9* 8.3*    Telemetry    Sinus bradycardia, nocturnal PAC not conducted (once), no ventrciluar arrhythmias (personally reviewed)  Radiology    Dg Chest 2 View  Result Date: 10/13/2016 CLINICAL DATA:  71 year old male status post aortic valve replacement. EXAM: CHEST  2 VIEW COMPARISON:  Chest x-ray 10/10/2016. FINDINGS: There is a right upper extremity PICC with tip terminating in the superior cavoatrial junction. Lung volumes are low. There are bibasilar opacities most compatible with resolving areas of postoperative subsegmental atelectasis. Superimposed small to moderate bilateral pleural effusions appear similar to slightly decreased compared with prior examination from 10/10/2016. No pneumothorax. The possibility of airspace consolidation in the lung bases is not excluded, but is not favored. No evidence of pulmonary edema. Cardiopericardial silhouette is upper limits of normal. Upper mediastinal contours are normal. Aortic atherosclerosis. Status post median sternotomy for. Epicardial pacing wires are noted. IMPRESSION: 1.  Slightly improved lung volumes in slightly improved aeration in the lung bases. There continues to be small to moderate bilateral pleural effusions with resolving postoperative subsegmental atelectasis throughout the lung bases bilaterally. 2. Aortic atherosclerosis. Aortic Atherosclerosis (ICD10-I70.0). Electronically Signed   By: Vinnie Langton M.D.   On: 10/13/2016 08:32     Patient Profile     Charles [name] is a 71 y.o. male with a past medical history significant for endocarditis.  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 Sinus bradycardia No symptoms Continue on current medicines, do not hold doses I anticipate that sinus rate will normalize once he returns to normal activity.  Plans for discharge to [facility] with [place] today noted I will see as needed while here  He is scheduled to follow-up with me 11/02/16  Signed, Thompson Grayer, MD  10/14/2016, 9:50 AM

## 2016-10-14 NOTE — Progress Notes (Signed)
Patient has insurance auth to DC to SNF today if medically stable  auth #: [id number] next review 7/6 rug # RVB approved for 570min/wk of therapy  Facility would prefer DC summary by 12pm for the holiday  CSW will continue to follow  Jorge Ny, [place] Social Worker [phone]

## 2016-10-14 NOTE — Progress Notes (Signed)
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  Patient underwent aortic root replacement, CABG x 2 and repair of PFO on 10/02/16.    PT Comments    Patient continues to progress toward mobility goals. Pt needs RW for safe ambulation and energy conservation. VSS throughout session and WNL. Pt demonstrated decreased activity tolerance and strength and will continue to benefit from further skilled PT services in acute setting and at d/c. 3/4 DOE when ambulating.    Follow Up Recommendations  CIR     Equipment Recommendations  Rolling walker with 5" wheels    Recommendations for Other Services Rehab consult     Precautions / Restrictions Precautions Precautions: Fall;Sternal    Mobility  Bed Mobility               General bed mobility comments: pt OOB in chair upon arrival  Transfers Overall transfer level: Needs assistance Equipment used: Rolling walker (2 wheeled) Transfers: Sit to/from Stand Sit to Stand: Supervision         General transfer comment: supervision for safety; pt able to sit and stand without UE support  Ambulation/Gait Ambulation/Gait assistance: Min guard Ambulation Distance (Feet): 280 Feet Assistive device: Rolling walker (2 wheeled) Gait Pattern/deviations: Step-through pattern;Decreased stride length;Trunk flexed     General Gait Details: cues for posture and decreased UE support on RW; standing rest breaks required due to 3/4 DOE   Stairs            Wheelchair Mobility    Modified Rankin (Stroke Patients Only)       Balance Overall balance assessment: Needs assistance Sitting-balance support: Feet supported Sitting balance-Leahy Scale: Fair     Standing balance support: Bilateral upper extremity supported Standing balance-Leahy Scale: Poor Standing balance comment: reliant on UE support as he is unsteady without support                            Cognition Arousal/Alertness: Awake/alert Behavior During Therapy: WFL for tasks assessed/performed Overall Cognitive Status: Within Functional Limits for tasks assessed                                        Exercises General Exercises - Lower Extremity Long Arc Quad: AROM;Both;10 reps;Seated Hip Flexion/Marching: AROM;Both;5 reps;Seated Toe Raises: AROM;Both;20 reps Heel Raises: AROM;Both;20 reps    General Comments General comments (skin integrity, edema, etc.): VSS       Pertinent Vitals/Pain Pain Assessment: No/denies pain    Home Living                      Prior Function            PT Goals (current goals can now be found in the care plan section) Progress towards PT goals: Progressing toward goals    Frequency    Min 3X/week      PT Plan Current plan remains appropriate    Co-evaluation              AM-PAC PT "6 Clicks" Daily Activity  Outcome Measure  Difficulty turning over in bed (including adjusting bedclothes, sheets and  blankets)?: Total Difficulty moving from lying on back to sitting on the side of the bed? : A Lot Difficulty sitting down on and standing up from a chair with arms (e.g., wheelchair, bedside commode, etc,.)?: A Little Help needed moving to and from a bed to chair (including a wheelchair)?: A Little Help needed walking in hospital room?: A Little Help needed climbing 3-5 steps with a railing? : A Lot 6 Click Score: 14    End of Session Equipment Utilized During Treatment: Gait belt Activity Tolerance: Patient tolerated treatment well Patient left: in chair;with call bell/phone within reach Nurse Communication: Mobility status PT Visit Diagnosis: Muscle weakness (generalized)  (M62.81);Difficulty in walking, not elsewhere classified (R26.2)     Time: 8786-7672 PT Time Calculation (min) (ACUTE ONLY): 33 min  Charges:  $Gait Training: 8-22 mins $Therapeutic Exercise: 8-22 mins                    G Codes:       Earney Navy, PTA Pager: [phone]     Darliss Cheney 10/14/2016, 12:10 PM

## 2016-10-14 NOTE — Progress Notes (Signed)
Per physician note plan for DC tomorrow morning- facility undated and insurance auth should still be good at that time  CSW will continue to follow  Jorge Ny, [place] Social Worker [phone]

## 2016-10-15 DIAGNOSIS — G4700 Insomnia, unspecified: Secondary | ICD-10-CM | POA: Diagnosis not present

## 2016-10-15 DIAGNOSIS — I38 Endocarditis, valve unspecified: Secondary | ICD-10-CM | POA: Diagnosis not present

## 2016-10-15 DIAGNOSIS — I719 Aortic aneurysm of unspecified site, without rupture: Secondary | ICD-10-CM | POA: Diagnosis not present

## 2016-10-15 DIAGNOSIS — I330 Acute and subacute infective endocarditis: Secondary | ICD-10-CM | POA: Diagnosis not present

## 2016-10-15 DIAGNOSIS — I5023 Acute on chronic systolic (congestive) heart failure: Secondary | ICD-10-CM | POA: Diagnosis not present

## 2016-10-15 DIAGNOSIS — I499 Cardiac arrhythmia, unspecified: Secondary | ICD-10-CM | POA: Diagnosis not present

## 2016-10-15 DIAGNOSIS — M6281 Muscle weakness (generalized): Secondary | ICD-10-CM | POA: Diagnosis not present

## 2016-10-15 DIAGNOSIS — I214 Non-ST elevation (NSTEMI) myocardial infarction: Secondary | ICD-10-CM | POA: Diagnosis not present

## 2016-10-15 DIAGNOSIS — Q231 Congenital insufficiency of aortic valve: Secondary | ICD-10-CM | POA: Diagnosis not present

## 2016-10-15 DIAGNOSIS — I359 Nonrheumatic aortic valve disorder, unspecified: Secondary | ICD-10-CM | POA: Diagnosis not present

## 2016-10-15 DIAGNOSIS — R0600 Dyspnea, unspecified: Secondary | ICD-10-CM | POA: Diagnosis not present

## 2016-10-15 DIAGNOSIS — I339 Acute and subacute endocarditis, unspecified: Secondary | ICD-10-CM | POA: Diagnosis not present

## 2016-10-15 DIAGNOSIS — Z951 Presence of aortocoronary bypass graft: Secondary | ICD-10-CM | POA: Diagnosis not present

## 2016-10-15 DIAGNOSIS — I5040 Unspecified combined systolic (congestive) and diastolic (congestive) heart failure: Secondary | ICD-10-CM | POA: Diagnosis not present

## 2016-10-15 DIAGNOSIS — I472 Ventricular tachycardia: Secondary | ICD-10-CM | POA: Diagnosis not present

## 2016-10-15 DIAGNOSIS — R262 Difficulty in walking, not elsewhere classified: Secondary | ICD-10-CM | POA: Diagnosis not present

## 2016-10-15 MED ORDER — MEXILETINE HCL 200 MG PO CAPS: 200.0000 mg | ORAL_CAPSULE | Freq: Three times a day (TID) | ORAL | Status: DC

## 2016-10-15 MED ORDER — AMIODARONE HCL 200 MG PO TABS: 200.0000 mg | ORAL_TABLET | Freq: Two times a day (BID) | ORAL | Status: DC

## 2016-10-15 MED ORDER — OXYCODONE HCL 5 MG PO TABS: 5.0000 mg | ORAL_TABLET | Freq: Four times a day (QID) | ORAL | 0 refills | Status: DC | PRN

## 2016-10-15 MED ORDER — HEPARIN SOD (PORK) LOCK FLUSH 100 UNIT/ML IV SOLN
250.0000 [IU] | INTRAVENOUS | Status: AC | PRN
  Administered 2016-10-15: 250 [IU]

## 2016-10-15 MED ORDER — FERROUS SULFATE 325 (65 FE) MG PO TABS: 325.0000 mg | ORAL_TABLET | Freq: Every day | ORAL | Status: DC

## 2016-10-15 MED ORDER — ASPIRIN 325 MG PO TBEC: 325.0000 mg | DELAYED_RELEASE_TABLET | Freq: Every day | ORAL | 0 refills | Status: DC

## 2016-10-15 MED ORDER — HEPARIN SOD (PORK) LOCK FLUSH 10 UNIT/ML IV SOLN: 10.0000 [IU] | INTRAVENOUS | Status: DC | PRN

## 2016-10-15 MED ORDER — METOPROLOL TARTRATE 25 MG PO TABS: 25.0000 mg | ORAL_TABLET | Freq: Two times a day (BID) | ORAL | Status: DC

## 2016-10-15 MED ORDER — POTASSIUM CHLORIDE CRYS ER 20 MEQ PO TBCR: 20.0000 meq | EXTENDED_RELEASE_TABLET | Freq: Every day | ORAL | Status: DC

## 2016-10-15 MED ORDER — SODIUM CHLORIDE 0.9 % IV SOLN: 3.0000 g | Freq: Four times a day (QID) | INTRAVENOUS | 0 refills | Status: AC

## 2016-10-15 NOTE — Clinical Social Work Placement (Signed)
   CLINICAL SOCIAL WORK PLACEMENT  NOTE  Date:  10/15/2016  Patient Details  Name: Charles [name] MRN: [id number] Date of Birth: May 04, 1945  Clinical Social Work is seeking post-discharge placement for this patient at the [place] level of care (*CSW will initial, date and re-position this form in  chart as items are completed):  Yes   Patient/family provided with [place] Work Department's list of facilities offering this level of care within the geographic area requested by the patient (or if unable, by the patient's family).  Yes   Patient/family informed of their freedom to choose among providers that offer the needed level of care, that participate in Medicare, Medicaid or managed care program needed by the patient, have an available bed and are willing to accept the patient.  Yes   Patient/family informed of [facility]'s ownership interest in [facility] and [facility], as well as of the fact that they are under no obligation to receive care at these facilities.  PASRR submitted to EDS on 10/12/16     PASRR number received on 10/12/16     Existing PASRR number confirmed on       FL2 transmitted to all facilities in geographic area requested by pt/family on 10/12/16     FL2 transmitted to all facilities within larger geographic area on       Patient informed that his/her managed care company has contracts with or will negotiate with certain facilities, including the following:        Yes   Patient/family informed of bed offers received.  Patient chooses bed at [facility]     Physician recommends and patient chooses bed at      Patient to be transferred to [facility] on 10/15/16.  Patient to be transferred to facility by ptar     Patient family notified on 10/15/16 of transfer.  Name of family member notified:  Mary     PHYSICIAN Please sign FL2     Additional Comment:     _______________________________________________ Jorge Ny, LCSW 10/15/2016, 10:09 AM

## 2016-10-15 NOTE — Progress Notes (Signed)
Patient will discharge to [facility] Anticipated discharge date: 10/15/16 Family notified: pt wife Transportation by [employer]- scheduled for 11am  CSW signing off.  Jorge Ny, LCSW Clinical Social Worker [phone]

## 2016-10-15 NOTE — Progress Notes (Addendum)
[address]       [place] [address]             [phone]      8 Days Post-Op Procedure(s) (LRB): Left Heart Cath and Cors/Grafts Angiography (N/A) Subjective: Feels well, no complaints  Objective: Vital signs in last 24 hours: Temp:  [97.7 F (36.5 C)-98.1 F (36.7 C)] 98.1 F (36.7 C) (07/05 0524) Pulse Rate:  [58-64] 58 (07/05 0524) Cardiac Rhythm: Heart block;Bundle branch block;Normal sinus rhythm (07/04 1901) Resp:  [18] 18 (07/05 0524) BP: (112-134)/(61-76) 117/61 (07/05 0524) SpO2:  [93 %-98 %] 93 % (07/05 0524) Weight:  [236 lb 9.6 oz (107.3 kg)] 236 lb 9.6 oz (107.3 kg) (07/05 0524)  Hemodynamic parameters for last 24 hours:    Intake/Output from previous day: 07/04 0701 - 07/05 0700 In: 870 [P.O.:720; I.V.:50; IV Piggyback:100] Out: 400 [Urine:400] Intake/Output this shift: No intake/output data recorded.  General appearance: alert, cooperative and no distress Heart: regular rate and rhythm Lungs: clear to auscultation bilaterally Abdomen: benign Extremities: + edema Wound: incis healing well  Lab Results:  Recent Labs  10/13/16 0404  WBC 8.7  HGB 8.3*  HCT 26.8*  PLT 369   BMET:  Recent Labs  10/13/16 0404 10/14/16 0202  NA 140 140  K 3.7 4.1  CL 107 109  CO2 26 22  GLUCOSE 90 87  BUN 17 18  CREATININE 1.51* 1.42*  CALCIUM 7.9* 8.3*    PT/INR: No results for input(s): LABPROT, INR in the last 72 hours. ABG    Component Value Date/Time   PHART 7.499 (H) 10/07/2016 0457   HCO3 34.6 (H) 10/07/2016 0457   TCO2 36 10/07/2016 0457   ACIDBASEDEF 1.0 10/02/2016 2329   O2SAT 65.4 10/08/2016 0450   CBG (last 3)  No results for input(s): GLUCAP in the last 72 hours.  Meds Scheduled Meds: . amiodarone  200 mg Oral BID  . aspirin EC  325 mg Oral Daily  . bisacodyl  10 mg Oral Daily   Or  . bisacodyl  10 mg Rectal Daily  . Chlorhexidine Gluconate Cloth  6 each Topical Daily  . docusate sodium  200 mg Oral  Daily  . enoxaparin (LOVENOX) injection  30 mg Subcutaneous Q24H  . ferrous sulfate  325 mg Oral Daily  . folic acid-pyridoxine-cyancobalamin  1 tablet Oral Daily  . furosemide  40 mg Oral Daily  . metoprolol tartrate  25 mg Oral BID  . mexiletine  200 mg Oral Q8H  . moving right along book   Does not apply Once  . pantoprazole  40 mg Oral Daily  . potassium chloride  20 mEq Oral BID  . rosuvastatin  10 mg Oral Daily  . sodium chloride flush  3 mL Intravenous Q12H   Continuous Infusions: . sodium chloride    . ampicillin-sulbactam (UNASYN) IV 3 g (10/15/16 0535)   PRN Meds:.sodium chloride, alum & mag hydroxide-simeth, magnesium hydroxide, ondansetron (ZOFRAN) IV, oxyCODONE, sodium chloride flush, traMADol  Xrays Dg Chest 2 View  Result Date: 10/13/2016 CLINICAL DATA:  71 year old male status post aortic valve replacement. EXAM: CHEST  2 VIEW COMPARISON:  Chest x-ray 10/10/2016. FINDINGS: There is a right upper extremity PICC with tip terminating in the superior cavoatrial junction. Lung volumes are low. There are bibasilar opacities most compatible with resolving areas of postoperative subsegmental atelectasis. Superimposed small to moderate bilateral pleural effusions appear similar to slightly decreased compared with prior examination from  10/10/2016. No pneumothorax. The possibility of airspace consolidation in the lung bases is not excluded, but is not favored. No evidence of pulmonary edema. Cardiopericardial silhouette is upper limits of normal. Upper mediastinal contours are normal. Aortic atherosclerosis. Status post median sternotomy for. Epicardial pacing wires are noted. IMPRESSION: 1. Slightly improved lung volumes in slightly improved aeration in the lung bases. There continues to be small to moderate bilateral pleural effusions with resolving postoperative subsegmental atelectasis throughout the lung bases bilaterally. 2. Aortic atherosclerosis. Aortic Atherosclerosis  (ICD10-I70.0). Electronically Signed   By: Vinnie Langton M.D.   On: 10/13/2016 08:32    Assessment/Plan: S/P Procedure(s) (LRB): Left Heart Cath and Cors/Grafts Angiography (N/A)  1 doing well  2 rhythm stable, has life vest 3 stable for d/c to snf    LOS: 21 days    GOLD,WAYNE E 10/15/2016 Plan d/c to snf  Today I have seen and examined Cathleen Corti and agree with the above assessment  and plan.  Grace Isaac MD Beeper [phone] Office [phone] 10/15/2016 9:02 AM

## 2016-10-16 DIAGNOSIS — I339 Acute and subacute endocarditis, unspecified: Secondary | ICD-10-CM | POA: Diagnosis not present

## 2016-10-16 DIAGNOSIS — Z951 Presence of aortocoronary bypass graft: Secondary | ICD-10-CM | POA: Diagnosis not present

## 2016-10-16 DIAGNOSIS — Q231 Congenital insufficiency of aortic valve: Secondary | ICD-10-CM | POA: Diagnosis not present

## 2016-10-16 DIAGNOSIS — I472 Ventricular tachycardia: Secondary | ICD-10-CM | POA: Diagnosis not present

## 2016-10-19 ENCOUNTER — Ambulatory Visit: Payer: Self-pay | Admitting: Cardiology

## 2016-10-19 DIAGNOSIS — G4700 Insomnia, unspecified: Secondary | ICD-10-CM | POA: Diagnosis not present

## 2016-10-19 DIAGNOSIS — I5023 Acute on chronic systolic (congestive) heart failure: Secondary | ICD-10-CM | POA: Diagnosis not present

## 2016-10-19 DIAGNOSIS — I214 Non-ST elevation (NSTEMI) myocardial infarction: Secondary | ICD-10-CM | POA: Diagnosis not present

## 2016-10-19 DIAGNOSIS — I330 Acute and subacute infective endocarditis: Secondary | ICD-10-CM | POA: Diagnosis not present

## 2016-10-20 ENCOUNTER — Telehealth: Payer: Self-pay | Admitting: Adult Health

## 2016-10-20 DIAGNOSIS — I719 Aortic aneurysm of unspecified site, without rupture: Secondary | ICD-10-CM | POA: Diagnosis not present

## 2016-10-20 DIAGNOSIS — I214 Non-ST elevation (NSTEMI) myocardial infarction: Secondary | ICD-10-CM | POA: Diagnosis not present

## 2016-10-20 DIAGNOSIS — I359 Nonrheumatic aortic valve disorder, unspecified: Secondary | ICD-10-CM | POA: Diagnosis not present

## 2016-10-20 DIAGNOSIS — I5023 Acute on chronic systolic (congestive) heart failure: Secondary | ICD-10-CM | POA: Diagnosis not present

## 2016-10-20 NOTE — Telephone Encounter (Signed)
Patient wanted to know about 325 mg dose of ASA and if he could stop Iron.I told him he needed to speak with Dr Jenna Luo office as these are their orders. He agreed he would call them

## 2016-10-20 NOTE — Telephone Encounter (Signed)
Would like to go over his med list--please give him a call [phone]

## 2016-10-21 ENCOUNTER — Encounter (HOSPITAL_COMMUNITY): Payer: Self-pay

## 2016-10-21 DIAGNOSIS — Z792 Long term (current) use of antibiotics: Secondary | ICD-10-CM | POA: Diagnosis not present

## 2016-10-21 DIAGNOSIS — I441 Atrioventricular block, second degree: Secondary | ICD-10-CM | POA: Diagnosis not present

## 2016-10-21 DIAGNOSIS — I5023 Acute on chronic systolic (congestive) heart failure: Secondary | ICD-10-CM | POA: Diagnosis not present

## 2016-10-21 DIAGNOSIS — I330 Acute and subacute infective endocarditis: Secondary | ICD-10-CM | POA: Diagnosis not present

## 2016-10-21 DIAGNOSIS — R05 Cough: Secondary | ICD-10-CM | POA: Diagnosis not present

## 2016-10-21 DIAGNOSIS — R531 Weakness: Secondary | ICD-10-CM | POA: Diagnosis not present

## 2016-10-21 DIAGNOSIS — Z452 Encounter for adjustment and management of vascular access device: Secondary | ICD-10-CM | POA: Diagnosis not present

## 2016-10-21 DIAGNOSIS — Z951 Presence of aortocoronary bypass graft: Secondary | ICD-10-CM | POA: Diagnosis not present

## 2016-10-21 DIAGNOSIS — I2510 Atherosclerotic heart disease of native coronary artery without angina pectoris: Secondary | ICD-10-CM | POA: Diagnosis not present

## 2016-10-21 DIAGNOSIS — Z5181 Encounter for therapeutic drug level monitoring: Secondary | ICD-10-CM | POA: Diagnosis not present

## 2016-10-21 DIAGNOSIS — R079 Chest pain, unspecified: Secondary | ICD-10-CM | POA: Diagnosis not present

## 2016-10-21 DIAGNOSIS — Z48812 Encounter for surgical aftercare following surgery on the circulatory system: Secondary | ICD-10-CM | POA: Diagnosis not present

## 2016-10-21 DIAGNOSIS — Z952 Presence of prosthetic heart valve: Secondary | ICD-10-CM | POA: Diagnosis not present

## 2016-10-23 NOTE — Addendum Note (Signed)
Addendum  created 10/23/16 1711 by Lillia Abed, MD   Sign clinical note

## 2016-10-23 NOTE — Anesthesia Postprocedure Evaluation (Signed)
Anesthesia Post Note  Patient: Charles [name]  Procedure(s) Performed: Procedure(s) (LRB): Extraction of tooth #'s 2,4,14 with alveoloplasty (N/A)     Anesthesia Post Evaluation  Last Vitals:  Vitals:   08/13/16 0930 08/13/16 0945  BP: (!) 115/51 (!) 131/53  Pulse: 71 69  Resp: 16 18  Temp:  36.6 C    Last Pain:  Vitals:   08/13/16 0945  TempSrc:   PainSc: 3                  Shelie Lansing DAVID

## 2016-10-26 DIAGNOSIS — Z5181 Encounter for therapeutic drug level monitoring: Secondary | ICD-10-CM | POA: Diagnosis not present

## 2016-10-27 ENCOUNTER — Telehealth: Payer: Self-pay

## 2016-10-27 NOTE — Telephone Encounter (Signed)
Pt called concerning a medication that his pharmacy called to tell him that was ready for pick up. (Mexitil 200 mg)  He stated that it was called in from a P.A.  While he was at the [facility] and was wanting to be sure it is ok to take with his other medications. Please advise.

## 2016-10-28 NOTE — Telephone Encounter (Signed)
Please review the recent discharge summary. He was placed on this medication along with amiodarone by Dr. Rayann Heman for VT. He should continue it until reassessed by Dr. Rayann Heman.

## 2016-10-28 NOTE — Telephone Encounter (Signed)
Spoke with pt. He will start medication. He has an appointment tomorrow and will bring all medications with him.

## 2016-10-29 ENCOUNTER — Encounter: Payer: Self-pay | Admitting: Adult Health

## 2016-10-29 ENCOUNTER — Ambulatory Visit (INDEPENDENT_AMBULATORY_CARE_PROVIDER_SITE_OTHER): Payer: Medicare Other | Admitting: Adult Health

## 2016-10-29 VITALS — BP 124/70 | HR 66 | Ht 72.0 in | Wt 225.0 lb

## 2016-10-29 DIAGNOSIS — I330 Acute and subacute infective endocarditis: Secondary | ICD-10-CM | POA: Diagnosis not present

## 2016-10-29 DIAGNOSIS — I5020 Unspecified systolic (congestive) heart failure: Secondary | ICD-10-CM

## 2016-10-29 DIAGNOSIS — I351 Nonrheumatic aortic (valve) insufficiency: Secondary | ICD-10-CM | POA: Diagnosis not present

## 2016-10-29 DIAGNOSIS — I2510 Atherosclerotic heart disease of native coronary artery without angina pectoris: Secondary | ICD-10-CM | POA: Diagnosis not present

## 2016-10-29 DIAGNOSIS — I10 Essential (primary) hypertension: Secondary | ICD-10-CM | POA: Diagnosis not present

## 2016-10-29 NOTE — Progress Notes (Signed)
Cardiology Office Note   Date:  10/29/2016   ID:  Charles [name], DOB 06-Nov-1945, MRN [id number]  PCP:  [name], MD  Cardiologist:  Domenic Polite Chief Complaint  Patient presents with  . Cardiomyopathy  . Coronary Artery Disease      History of Present Illness: Charles [name] is a 71 y.o. male who presents for ongoing assessment and management of acute on chronic diastolic heart failure, severe nonrheumatic AR with bicuspid valve, ascending aortic aneurysm, coronary artery disease with 70% ostial RCA otherwise nonobstructive, and history of SBE.  Most recent cardiac catheterization was dated 10/07/2016: Conclusion     Prox Cx to Mid Cx lesion, 30 %stenosed.  Mid LAD lesion, 20 %stenosed.  Ost 2nd Diag lesion, 20 %stenosed.  Dist LAD lesion, 40 %stenosed.  Ost RCA lesion, 100 %stenosed.  SVG graft was visualized by angiography and is very large and anatomically normal.   1. 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 Patent SVG to PDA. The entire RCA fills from the patent vein graft.  3. Mild disease in the Circumflex and LAD. No change since last cath.  4. Normal LVEDP  Recommendations: No further ischemic workup   The patient has been seen by EP on 10/14/2016 during hospitalization in the setting of polymorphic VT. He was started on amiodarone twice a day 200 mg, was to stop mexiletine, continue metoprolol, advised not to drive for 6 months, and follow-up with EP post discharge.  Past Medical History:  Diagnosis Date  . Anemia   . Aortic insufficiency    a. 07/07/2016: TEE showing bicuspid aortic valve with severe eccentric AI   . Ascending aortic aneurysm (HCC)    4.4 cm by CT 06/2016  . 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 Bicuspid aortic valve   . Chronic periodontitis   . Coronary artery disease   . Pneumonia 1978  . 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 S/P patent foramen ovale closure 10/02/2016    Past Surgical History:  Procedure Laterality Date  . AORTIC VALVE REPLACEMENT N/A 10/02/2016   Procedure: AORTIC ROOT REPLACEMENT  - using 32mm Allograft;  Surgeon: [name], MD;  Location: [place];  Service: Open Heart Surgery;  Laterality: N/A;  . CORONARY ARTERY BYPASS GRAFT N/A 10/02/2016   Procedure: CORONARY ARTERY BYPASS GRAFTING (CABG) x one, using right leg greater saphenous vein harvested endoscopically;  Surgeon: [name], MD;  Location: [place];  Service: Open Heart Surgery;  Laterality: N/A;  . FALSE ANEURYSM REPAIR  10/02/2016   Procedure: REPAIR FALSE ANEURYSM of Aortic Root Resection and Grafting of Aortic Root;  Surgeon: [name], MD;  Location: [place];  Service: Open Heart Surgery;;  . HERNIA REPAIR Left   . LEFT HEART CATH AND CORS/GRAFTS ANGIOGRAPHY N/A 10/07/2016   Procedure: Left Heart Cath and Cors/Grafts Angiography;  Surgeon: Burnell Blanks, MD;  Location: [place] [facility];  Service: Cardiovascular;  Laterality: N/A;  . MULTIPLE EXTRACTIONS WITH ALVEOLOPLASTY N/A 08/13/2016   Procedure: Extraction of tooth #'s 2,4,14 with alveoloplasty;  Surgeon: Lenn Cal, DDS;  [facility];  Service: Oral Surgery;  Laterality: N/A;  . PATENT FORAMEN OVALE(PFO) CLOSURE N/A 10/02/2016   Procedure: PATENT FORAMEN OVALE (PFO) CLOSURE;  Surgeon: [name], MD;  Location: [place];  Service: Open Heart Surgery;  Laterality: N/A;  . RIGHT/LEFT HEART CATH AND CORONARY ANGIOGRAPHY N/A 08/03/2016   Procedure: Right/Left Heart Cath and Coronary Angiography;  Surgeon: Burnell Blanks, MD;  Location: [place] [facility];  Service:  Cardiovascular;  Laterality: N/A;  . TEE WITHOUT CARDIOVERSION N/A 07/07/2016   Procedure: TRANSESOPHAGEAL ECHOCARDIOGRAM (TEE);  Surgeon: Larey Dresser, MD;  Location: [place];  Service: Cardiovascular;  Laterality: N/A;  . TEE WITHOUT CARDIOVERSION N/A 10/02/2016   Procedure: TRANSESOPHAGEAL ECHOCARDIOGRAM (TEE);  Surgeon: [name], MD;  Location: [place];  Service: Open Heart Surgery;  Laterality: N/A;  . THORACIC AORTIC ANEURYSM REPAIR N/A 10/02/2016   Procedure: THORACIC ASCENDING ANEURYSM REPAIR  - using 37mm Allograft;  Reimplantation of Left Coronary Button;  Surgeon: [name], MD;  Location: [place];  Service: Open Heart Surgery;  Laterality: N/A;     Current Outpatient Prescriptions  Medication Sig Dispense Refill  . amiodarone (PACERONE) 200 MG tablet Take 1 tablet (200 mg total) by mouth 2 (two) times daily.    . Ampicillin-Sulbactam 3 g in sodium chloride 0.9 % 100 mL Inject 3 g into the vein every 6 (six) hours. 140 Dose 0  . aspirin EC 325 MG EC tablet Take 1 tablet (325 mg total) by mouth daily. 30 tablet 0  . atorvastatin (LIPITOR) 20 MG tablet Take 20 mg by mouth daily.    . ferrous sulfate 325 (65 FE) MG tablet Take 1 tablet (325 mg total) by mouth daily.    . furosemide (LASIX) 40 MG tablet Take 1 tablet (40 mg total) by mouth daily. 30 tablet 2  . metoprolol tartrate (LOPRESSOR) 25 MG tablet Take 1 tablet (25 mg total) by mouth 2 (two) times daily. Hold for HR less than 50    . mexiletine (MEXITIL) 200 MG capsule Take 1 capsule (200 mg total) by mouth every 8 (eight) hours.    . Multiple Vitamin (MULTIVITAMIN WITH MINERALS) TABS tablet Take 1 tablet by mouth daily.    . potassium chloride SA (K-DUR,KLOR-CON) 20 MEQ tablet Take 1 tablet (20 mEq total) by mouth daily.     No current facility-administered medications for this visit.     Allergies:   No known allergies    Social History:  The patient  reports that he quit smoking about 51 years ago. His  smoking use included Cigarettes. He has never used smokeless tobacco. He reports that he does not drink alcohol or use drugs.   Family History:  The patient's family history includes Heart disease (age of onset: 37) in his father; Ovarian cancer in his mother.    ROS: All other systems are reviewed and negative. Unless otherwise mentioned in H&P    PHYSICAL EXAM: VS:  BP 124/70   Pulse 66   Ht 6' (1.829 m)   Wt 225 lb (102.1 kg)   SpO2 96%   BMI 30.52 kg/m  , BMI Body mass index is 30.52 kg/m. GEN: Well nourished, well developed, in no acute distress Wearing Life Vest.  HEENT: normal  Neck: no JVD, carotid bruits, or masses Cardiac: RRR; distant heart sounds, no murmurs, rubs, or gallops, mild dependent edema in the LE, with R>L.  Respiratory:  clear to auscultation bilaterally, normal work of breathing  GI: soft, nontender, nondistended, + BS MS: no deformity or atrophy Well healed sternotomy.  Skin: warm and dry, no rash Neuro:  Strength and sensation are intact Psych: euthymic mood, full affect     Recent Labs: 07/01/2016: TSH 1.300 09/24/2016: B Natriuretic Peptide 253.0 10/07/2016: Magnesium 2.7 10/08/2016: ALT 14 10/13/2016: Hemoglobin 8.3; Platelets 369 10/14/2016: BUN 18; Creatinine, Ser 1.42; Potassium 4.1; Sodium 140    Lipid Panel    Component Value Date/Time   CHOL 122 07/03/2016 0715   TRIG 125 07/03/2016 0715   HDL 16 (L) 07/03/2016 0715   CHOLHDL 7.6 07/03/2016 0715   VLDL 25 07/03/2016 0715   LDLCALC 81 07/03/2016 0715      Wt Readings from Last 3 Encounters:  10/29/16 225 lb (102.1 kg)  10/15/16 236 lb 9.6 oz (107.3 kg)  09/08/16 233 lb (105.7 kg)      Other studies Reviewed: Procedure:10/02/2016   Aortic RootReplacement Human allograft aortic root graft(size 40mm, LifeNet ID# [id number]) Repair of false aneurysms of aortic root x2 Reimplantation of left main coronary artery Resection and  grafting of ascending thoracic aortic aneurysm   Coronary Artery Bypass Grafting x 2 Reversed Greater Saphenous Vein Graft to Distal RightCoronary Artery Endoscopic Vein Harvest from RightThigh   Closure of Patent Foramen Ovale   Operative Findings:  Severe aortic insufficiency  Acute diastolic congestive heart failure with severe pulmonary hypertension and moderate (2+) mitral regurgitation  Likely preexisting bicuspid aortic valve (Sievers type I)  Active vegetations with complete destruction of aortic valve leaflets  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 Wall thickness was   increased in a pattern of moderate LVH. Systolic function was   mildly to moderately reduced. The estimated ejection fraction was   in the range of 40% to 45%. Diffuse hypokinesis with more severe   anteroseptal hypokinesis/dyskinesis. The study is not technically   sufficient to allow evaluation of LV diastolic function. - Aortic valve: Poorly visualized. There was mild regurgitation. - Aorta: The aortic root has been replaced - measures 3.9 cm. - Mitral valve: Mildly thickened leaflets . There was mild   regurgitation. - Left atrium: The atrium was normal in size. - Right atrium: The atrium was mildly dilated. - Tricuspid valve: There was trivial regurgitation. - Pulmonary arteries: PA peak pressure: 22 mm Hg (S). - Inferior vena cava: The vessel was normal in size. The   respirophasic diameter changes were in the normal range (>= 50%),   consistent with normal central venous pressure.  ASSESSMENT AND  PLAN:  1. Severe aortic valve insufficiency with Ascending Thoracic Aortic Aneurysm: Surgical repair by Dr. Ricard Dillon on 10/02/2016. The patient had bacterial endocarditis postoperatively. The patient had extensive surgical repair using a human all of graft aortic root graft (size 40mm, LifeNet ID# [id number]). He also had repair of false aneurysms of the aortic root, reimplantation a left main coronary artery, resection and grafting of ascending thoracic aortic aneurysm.   He is doing remarkably well since discharge. He has home physical therapy, and he is slowly regaining strength. He denies any significant pain in his chest postoperatively. No evidence of CHF or fluid retention on exam today. He does have mild lower  extremity edema in the dependent position right greater than left. We'll order TED hose to assist with venous return.  2. Bacterial endocarditis: He continues on IV antibiotics at home.  3. Coronary artery disease: Status post single-vessel bypass graft, with reverse SVG to distal right coronary artery. Sternotomy is well-healed. No evidence of evisceration or infection. Continue metoprolol 25 mg twice a day and aspirin.  4. PFO: Repair during cardiothoracic surgery.  5. Nonischemic cardiomyopathy: The patient most recent echocardiogram demonstrates EF of 40% to 45%. He continues on by mouth Lasix and potassium. Weight is stable. No evidence of decompensation.   6. Ventricular Tachycardia: The patient had a life Vest placed due to cardiac arrest, VT, and multiple DCCV shocks. He is continued on amiodarone 200 mg twice a day. He is due to follow-up with EP in 3 days. We'll leave him on this current dose of amlodipine until seen by EP at which time may decide to decrease his dose. He was also started on Mexiletine every 8 hours. We'll defer to EP for dosing adjustment.  7. Hypertension: Blood pressure is currently controlled on metoprolol, and Lasix for fluid retention. Consider ACE  inhibitor at later date.  8. Deconditioning: Continues home PT. May consider cardiac rehabilitation later date once he has regained his strength and stamina. For now no changes       Current medicines are reviewed at length with the patient today.    Labs/ tests ordered today include:  Phill Myron. West Pugh, ANP, AACC   10/29/2016 4:02 PM    [facility] [address] [place], [place] [address] Phone: [phone]; Fax: [phone]

## 2016-10-29 NOTE — Patient Instructions (Signed)
Medication Instructions:  Your physician recommends that you continue on your current medications as directed. Please refer to the Current Medication list given to you today.   Labwork: None  Testing/Procedures: None  Follow-Up: Your physician recommends that you schedule a follow-up appointment in: 3 Months    Any Other Special Instructions Will Be Listed Below (If Applicable).  None   If you need a refill on your cardiac medications before your next appointment, please call your pharmacy.

## 2016-11-02 ENCOUNTER — Ambulatory Visit (INDEPENDENT_AMBULATORY_CARE_PROVIDER_SITE_OTHER): Payer: Medicare Other | Admitting: Internal Medicine

## 2016-11-02 ENCOUNTER — Encounter: Payer: Self-pay | Admitting: Internal Medicine

## 2016-11-02 VITALS — BP 130/86 | HR 54 | Ht 72.0 in | Wt 228.0 lb

## 2016-11-02 DIAGNOSIS — Z5181 Encounter for therapeutic drug level monitoring: Secondary | ICD-10-CM | POA: Diagnosis not present

## 2016-11-02 DIAGNOSIS — I4729 Other ventricular tachycardia: Secondary | ICD-10-CM

## 2016-11-02 DIAGNOSIS — I495 Sick sinus syndrome: Secondary | ICD-10-CM

## 2016-11-02 DIAGNOSIS — I472 Ventricular tachycardia, unspecified: Secondary | ICD-10-CM

## 2016-11-02 DIAGNOSIS — I5020 Unspecified systolic (congestive) heart failure: Secondary | ICD-10-CM

## 2016-11-02 DIAGNOSIS — I110 Hypertensive heart disease with heart failure: Secondary | ICD-10-CM

## 2016-11-02 DIAGNOSIS — I2510 Atherosclerotic heart disease of native coronary artery without angina pectoris: Secondary | ICD-10-CM

## 2016-11-02 DIAGNOSIS — Z954 Presence of other heart-valve replacement: Secondary | ICD-10-CM | POA: Diagnosis not present

## 2016-11-02 DIAGNOSIS — I330 Acute and subacute infective endocarditis: Secondary | ICD-10-CM | POA: Diagnosis not present

## 2016-11-02 MED ORDER — AMIODARONE HCL 200 MG PO TABS: 200.0000 mg | ORAL_TABLET | Freq: Every day | ORAL | 3 refills | Status: DC

## 2016-11-02 NOTE — Progress Notes (Signed)
PCP: [name], MD Primary Cardiologist: Dr Megan Mans Charles [name] is a 71 y.o. male who presents today for routine electrophysiology followup.  Since his recent hospital discharge, the patient reports doing reasonably well.  He is making slow progress post operatively.  Energy is improving.  States "I feel great".  No ICD shocks or symptoms of arrhythmia.  Today, he denies symptoms of palpitations, chest pain, shortness of breath,  lower extremity edema, dizziness, presyncope, or syncope.  The patient is otherwise without complaint today.   Past Medical History:  Diagnosis Date  . Anemia   . Aortic insufficiency    a. 07/07/2016: TEE showing bicuspid aortic valve with severe eccentric AI   . Ascending aortic aneurysm (HCC)    4.4 cm by CT 06/2016  . 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 Bicuspid aortic valve   . Chronic periodontitis   . Coronary artery disease   . Pneumonia 1978  . 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 S/P patent foramen ovale closure 10/02/2016   Past Surgical History:  Procedure Laterality Date  . AORTIC VALVE REPLACEMENT N/A 10/02/2016   Procedure: AORTIC ROOT REPLACEMENT  - using 24mm Allograft;  Surgeon: [name], MD;  Location: [place];  Service: Open Heart Surgery;  Laterality: N/A;  . CORONARY ARTERY BYPASS GRAFT N/A 10/02/2016   Procedure: CORONARY ARTERY BYPASS GRAFTING (CABG) x one, using right leg greater saphenous vein harvested endoscopically;  Surgeon: [name], MD;  Location: [place];  Service: Open Heart Surgery;  Laterality: N/A;  . FALSE ANEURYSM REPAIR  10/02/2016   Procedure: REPAIR FALSE ANEURYSM of Aortic Root  Resection and Grafting of Aortic Root;  Surgeon: [name], MD;  Location: [place];  Service: Open Heart Surgery;;  . HERNIA REPAIR Left   . LEFT HEART CATH AND CORS/GRAFTS ANGIOGRAPHY N/A 10/07/2016   Procedure: Left Heart Cath and Cors/Grafts Angiography;  Surgeon: Burnell Blanks, MD;  Location: [place] [facility];  Service: Cardiovascular;  Laterality: N/A;  . MULTIPLE EXTRACTIONS WITH ALVEOLOPLASTY N/A 08/13/2016   Procedure: Extraction of tooth #'s 2,4,14 with alveoloplasty;  Surgeon: Lenn Cal, DDS;  Location: [place];  Service: Oral Surgery;  Laterality: N/A;  . PATENT FORAMEN OVALE(PFO) CLOSURE N/A 10/02/2016   Procedure: PATENT FORAMEN OVALE (PFO) CLOSURE;  Surgeon: [name], MD;  Location: [place];  Service: Open Heart Surgery;  Laterality: N/A;  . RIGHT/LEFT HEART CATH AND CORONARY ANGIOGRAPHY N/A 08/03/2016   Procedure: Right/Left Heart Cath and Coronary Angiography;  Surgeon: Burnell Blanks, MD;  Location: [place] [facility];  Service: Cardiovascular;  Laterality: N/A;  . TEE WITHOUT CARDIOVERSION N/A 07/07/2016   Procedure: TRANSESOPHAGEAL ECHOCARDIOGRAM (TEE);  Surgeon: Larey Dresser, MD;  Location: [place];  Service: Cardiovascular;  Laterality: N/A;  . TEE WITHOUT CARDIOVERSION N/A 10/02/2016   Procedure: TRANSESOPHAGEAL ECHOCARDIOGRAM (TEE);  Surgeon: [name], MD;  Location: [place];  Service: Open Heart Surgery;  Laterality: N/A;  . THORACIC AORTIC ANEURYSM REPAIR N/A 10/02/2016   Procedure: THORACIC ASCENDING ANEURYSM REPAIR  - using 20mm Allograft;  Reimplantation of Left Coronary Button;  Surgeon:  [name], MD;  Location: [place];  Service: Open Heart Surgery;  Laterality: N/A;    ROS- all systems are reviewed and negatives except as per HPI above  Current Outpatient Prescriptions  Medication Sig Dispense Refill  . amiodarone (PACERONE) 200 MG tablet Take 1 tablet (200 mg total) by mouth 2 (two) times daily.    .  Ampicillin-Sulbactam 3 g in sodium chloride 0.9 % 100 mL Inject 3 g into the vein every 6 (six) hours. 140 Dose 0  . aspirin EC 325 MG EC tablet Take 1 tablet (325 mg total) by mouth daily. 30 tablet 0  . atorvastatin (LIPITOR) 20 MG tablet Take 20 mg by mouth daily.    . ferrous sulfate 325 (65 FE) MG tablet Take 1 tablet (325 mg total) by mouth daily.    . furosemide (LASIX) 40 MG tablet Take 1 tablet (40 mg total) by mouth daily. 30 tablet 2  . metoprolol tartrate (LOPRESSOR) 25 MG tablet Take 1 tablet (25 mg total) by mouth 2 (two) times daily. Hold for HR less than 50    . mexiletine (MEXITIL) 200 MG capsule Take 1 capsule (200 mg total) by mouth every 8 (eight) hours.    . Multiple Vitamin (MULTIVITAMIN WITH MINERALS) TABS tablet Take 1 tablet by mouth daily.    . potassium chloride SA (K-DUR,KLOR-CON) 20 MEQ tablet Take 1 tablet (20 mEq total) by mouth daily.     No current facility-administered medications for this visit.     Physical Exam: Vitals:   11/02/16 1248  BP: 130/86  Pulse: (!) 54  SpO2: 97%  Weight: 228 lb (103.4 kg)  Height: 6' (1.829 m)    GEN- The patient is well appearing, alert and oriented x 3 today.   Head- normocephalic, atraumatic Eyes-  Sclera clear, conjunctiva pink Ears- hearing intact Oropharynx- clear Lungs- Clear to ausculation bilaterally, normal work of breathing Heart- Regular rate and rhythm, no murmurs, rubs or gallops, PMI not laterally displaced GI- soft, NT, ND, + BS Extremities- no clubbing, cyanosis, or edema  EKG tracing ordered today is personally reviewed and shows sinus bradycardia 54 bpm, PR 248 msec, LVH, LAD  Lifevest interrogation is reviewed today and reveals no arrhythmias  Assessment and Plan:  1. Polymorphic VT Doing well at this time Stop mexilitine Reduce amiodarone to 200mg  daily Continue metoprolol No driving x 6 months  2. Sinus bradycardia Asymptomatic  3. S/p AVR and aortic root homograft for  endocarditis Followed by Dr Domenic Polite and Dr Roxy Manns  4. CAD No ischemic symptoms No changes today  5. Nonischemic CM EF 45% Medical optimization per Dr Domenic Polite Repeat echo once medicines are optimized  6. Hypertensive cardiovascular disease Stable No change required today  Return to see me in 6 weeks  Thompson Grayer MD, [facility] 11/02/2016 1:04 PM

## 2016-11-02 NOTE — Patient Instructions (Addendum)
Medication Instructions:  Your physician has recommended you make the following change in your medication:  1) STOP Mexiletine 2) Decrease Amiodarone to 200 mg once daily   Labwork: None ordered   Testing/Procedures: None ordered   Follow Up: Your physician wants you to follow-up in: 6 weeks with Dr. Rayann Heman.    Any Other Special Instructions Will Be Listed Below (If Applicable).  No driving for 6 months   If you need a refill on your cardiac medications before your next appointment, please call your pharmacy.

## 2016-11-04 ENCOUNTER — Telehealth: Payer: Self-pay | Admitting: Internal Medicine

## 2016-11-04 NOTE — Telephone Encounter (Signed)
Returned call to the patient and his LV was gonging at him last night.  I explained to him what the difference between a gong and a siren meant.  Charles [name] is usually a technical problem.  He then said one of the electrodes was off his skin.  I let him know this was okay and the vest was doing what is was supposed to do.  He appreciated my time and calling to explain to him.

## 2016-11-04 NOTE — Telephone Encounter (Signed)
Charles [name] is calling because he did a manual send on his life vest and wants to know what the results of it was .  Please call

## 2016-11-09 ENCOUNTER — Ambulatory Visit: Payer: Self-pay | Admitting: Thoracic Surgery (Cardiothoracic Vascular Surgery)

## 2016-11-09 DIAGNOSIS — I330 Acute and subacute infective endocarditis: Secondary | ICD-10-CM | POA: Diagnosis not present

## 2016-11-09 DIAGNOSIS — Z5181 Encounter for therapeutic drug level monitoring: Secondary | ICD-10-CM | POA: Diagnosis not present

## 2016-11-11 DIAGNOSIS — I719 Aortic aneurysm of unspecified site, without rupture: Secondary | ICD-10-CM | POA: Diagnosis not present

## 2016-11-11 DIAGNOSIS — Z8709 Personal history of other diseases of the respiratory system: Secondary | ICD-10-CM | POA: Diagnosis not present

## 2016-11-11 DIAGNOSIS — Z952 Presence of prosthetic heart valve: Secondary | ICD-10-CM | POA: Diagnosis not present

## 2016-11-11 DIAGNOSIS — Z6830 Body mass index (BMI) 30.0-30.9, adult: Secondary | ICD-10-CM | POA: Diagnosis not present

## 2016-11-12 ENCOUNTER — Other Ambulatory Visit: Payer: Self-pay

## 2016-11-12 ENCOUNTER — Other Ambulatory Visit: Payer: Self-pay | Admitting: *Deleted

## 2016-11-12 ENCOUNTER — Telehealth: Payer: Self-pay

## 2016-11-12 DIAGNOSIS — I359 Nonrheumatic aortic valve disorder, unspecified: Secondary | ICD-10-CM

## 2016-11-12 MED ORDER — AMOXICILLIN 500 MG PO CAPS: 2000.0000 mg | ORAL_CAPSULE | Freq: Once | ORAL | 2 refills | Status: AC

## 2016-11-12 NOTE — Telephone Encounter (Signed)
Patient notifed

## 2016-11-12 NOTE — Telephone Encounter (Signed)
-----   Message from [name], MD sent at 11/12/2016  1:44 PM EDT ----- Regarding: RE: dental question He can have his teeth cleaned and partial made if his dentist give antibiotics prior to the procedures  ----- Message ----- From: Marylen Ponto, LPN Sent: 05/23/5619  30:86 PM To: [name], MD Subject: dental question                                Patient wanting to know if he is able to have a dental partial made now. He had 3 teeth pulled. Also asking how long to wail before cleaning teeth. Please advise SW

## 2016-11-13 ENCOUNTER — Telehealth: Payer: Self-pay | Admitting: *Deleted

## 2016-11-13 NOTE — Telephone Encounter (Signed)
Dr. Juel Burrow office called to request that Mr. Charles [name]'s HHN be allowed to pull his PIC line today. He has completed his antibiotic therapy of 140 doses for endocarditis. I ok'd this request. He has a f/u with Dr. Roxy Manns on 11/23/16.

## 2016-11-15 ENCOUNTER — Encounter: Payer: Self-pay | Admitting: Internal Medicine

## 2016-11-16 DIAGNOSIS — I472 Ventricular tachycardia: Secondary | ICD-10-CM | POA: Diagnosis not present

## 2016-11-16 DIAGNOSIS — Z951 Presence of aortocoronary bypass graft: Secondary | ICD-10-CM | POA: Diagnosis not present

## 2016-11-16 DIAGNOSIS — Q231 Congenital insufficiency of aortic valve: Secondary | ICD-10-CM | POA: Diagnosis not present

## 2016-11-16 DIAGNOSIS — I339 Acute and subacute endocarditis, unspecified: Secondary | ICD-10-CM | POA: Diagnosis not present

## 2016-11-20 ENCOUNTER — Other Ambulatory Visit: Payer: Self-pay | Admitting: Thoracic Surgery (Cardiothoracic Vascular Surgery)

## 2016-11-20 DIAGNOSIS — Z951 Presence of aortocoronary bypass graft: Secondary | ICD-10-CM

## 2016-11-23 ENCOUNTER — Ambulatory Visit (INDEPENDENT_AMBULATORY_CARE_PROVIDER_SITE_OTHER): Payer: Self-pay | Admitting: Thoracic Surgery (Cardiothoracic Vascular Surgery)

## 2016-11-23 ENCOUNTER — Encounter: Payer: Self-pay | Admitting: Thoracic Surgery (Cardiothoracic Vascular Surgery)

## 2016-11-23 ENCOUNTER — Ambulatory Visit
Admission: RE | Admit: 2016-11-23 | Discharge: 2016-11-23 | Disposition: A | Discharge status: Home / Self Care | Payer: Medicare Other | Source: Ambulatory Visit | Attending: Thoracic Surgery (Cardiothoracic Vascular Surgery) | Admitting: Thoracic Surgery (Cardiothoracic Vascular Surgery)

## 2016-11-23 VITALS — BP 149/91 | HR 74 | Resp 20 | Ht 72.0 in | Wt 227.0 lb

## 2016-11-23 DIAGNOSIS — Z951 Presence of aortocoronary bypass graft: Secondary | ICD-10-CM

## 2016-11-23 DIAGNOSIS — Z952 Presence of prosthetic heart valve: Secondary | ICD-10-CM

## 2016-11-23 DIAGNOSIS — Z8774 Personal history of (corrected) congenital malformations of heart and circulatory system: Secondary | ICD-10-CM

## 2016-11-23 DIAGNOSIS — Z9889 Other specified postprocedural states: Secondary | ICD-10-CM

## 2016-11-23 DIAGNOSIS — R918 Other nonspecific abnormal finding of lung field: Secondary | ICD-10-CM | POA: Diagnosis not present

## 2016-11-23 DIAGNOSIS — Z954 Presence of other heart-valve replacement: Secondary | ICD-10-CM

## 2016-11-23 NOTE — Patient Instructions (Addendum)
Continue to avoid any heavy lifting or strenuous use of your arms or shoulders for at least a total of three months from the time of surgery.  After three months you may gradually increase how much you lift or otherwise use your arms or chest as tolerated, with limits based upon whether or not activities lead to the return of significant discomfort.  Continue all previous medications without any changes at this time.  You may decrease your dose of aspirin to 81 mg/day in one month.  Keep a record of your blood pressure and discuss whether or not you should be on a medication for hypertension with Dr Rayann Heman and Dr Ferne Reus are encouraged to enroll and participate in the outpatient cardiac rehab program beginning as soon as practical.  Endocarditis is a potentially serious infection of heart valves or inside lining of the heart.  It occurs more commonly in patients with diseased heart valves (such as patient's with aortic or mitral valve disease) and in patients who have undergone heart valve repair or replacement.  Certain surgical and dental procedures may put you at risk, such as dental cleaning, other dental procedures, or any surgery involving the respiratory, urinary, gastrointestinal tract, gallbladder or prostate gland.   To minimize your chances for develooping endocarditis, maintain good oral health and seek prompt medical attention for any infections involving the mouth, teeth, gums, skin or urinary tract.    Always notify your doctor or dentist about your underlying heart valve condition before having any invasive procedures. You will need to take antibiotics before certain procedures, including all routine dental cleanings or other dental procedures.  Your cardiologist or dentist should prescribe these antibiotics for you to be taken ahead of time.

## 2016-11-23 NOTE — Progress Notes (Signed)
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 Diagnostic cardiac catheterization revealed widely patent saphenous vein graft to the posterior descending coronary artery with no other significant coronary artery disease, and the patient's PT was felt likely related to the patient's recent complex surgery involving the aortic root. Ultimately episodes of VT subsided with use of mexiletine and amiodarone, and the patient was followed closely by Dr. Rayann Heman and colleagues from the EP service. The patient was ultimately discharged home with a LifeVest in place on 10/15/2016. Since then he has done remarkably well.  He was seen in follow-up by Beckie Busing at [facility] in the [place] office on 10/29/2016 and he was seen in follow-up by Dr. Rayann Heman on 11/02/2016.   Clinically the patient has done well. Mexiletine was stopped by Dr. Rayann Heman. The patient remains on amiodarone. Clinically the patient reports feeling remarkably well. He states that his breathing is  dramatically better than it was prior to surgery. All of his swelling has resolved. He has no pain in his chest. His activity level is quite good and he wants to begin doing more.  He has not had any palpitations or other symptoms suggestive of episodes of nonsustained VT and his life vest has not discharged.   Current Outpatient Prescriptions  Medication Sig Dispense Refill  . amiodarone (PACERONE) 200 MG tablet Take 1 tablet (200 mg total) by mouth daily. 90 tablet 3  . aspirin EC 325 MG EC tablet Take 1 tablet (325 mg total) by mouth daily. 30 tablet 0  . atorvastatin (LIPITOR) 20 MG tablet Take 20 mg by mouth daily.    . ferrous sulfate 325 (65 FE) MG tablet Take 1 tablet (325 mg total) by mouth daily.    . metoprolol tartrate (LOPRESSOR) 25 MG tablet Take 1 tablet (25 mg total) by mouth 2 (two) times daily. Hold for HR less than 50    . Multiple Vitamin (MULTIVITAMIN WITH MINERALS) TABS tablet Take 1 tablet by mouth daily.    . potassium chloride SA (K-DUR,KLOR-CON) 20 MEQ tablet Take 1 tablet (20 mEq total) by mouth daily.    . furosemide (LASIX) 40 MG tablet Take 1 tablet (40 mg total) by mouth daily. 30 tablet 2   No current facility-administered medications for this visit.       Physical Exam:   BP (!) 149/91   Pulse 74   Resp 20   Ht 6' (1.829 m)   Wt 227 lb (103 kg)   SpO2 97% Comment: RA  BMI 30.79 kg/m   General:  Well-appearing  Chest:  Clear to auscultation  CV:   Regular rate and rhythm without murmur  Incisions:  Well-healed, sternum is stable  Abdomen:  Soft nontender  Extremities:  Warm and well-perfused, no edema  Diagnostic Tests:  CHEST  2 VIEW  COMPARISON:  October 13, 2016  FINDINGS: There is slight scarring in the left base. The lungs elsewhere are clear. Heart is slightly enlarged, stable. Pulmonary vascularity is within normal limits. Aorta is mildly tortuous but stable. There is aortic atherosclerosis. Patient is status post coronary  artery bypass grafting. No bone lesions.  IMPRESSION: Slight scarring left base. No edema or consolidation. Heart mildly enlarged, stable. There is aortic atherosclerosis.  Aortic Atherosclerosis (ICD10-I70.0).   Electronically Signed   By: Lowella Grip III M.D.   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    Plan:  I have encouraged the patient to continue to gradually increase his physical activity as tolerated with his primary limitation remaining that he refrain from heavy lifting or strenuous use of his arms or shoulders for least another 6-8 weeks. I think he could enroll and participate in outpatient cardiac rehabilitation program, but I would like to make sure that Dr. Rayann Heman agrees with this plan. At some point he will need a follow-up echocardiogram performed and a decision made as to whether not he can be taken off of his life vest or rather that he needs to proceed with defibrillator placement. I would not let him drive an automobile until he has been cleared by Dr. Rayann Heman. We have not recommended any changes in his current medications, although I think he could decrease his dose of aspirin to 81 mg daily once he is 3 months out from surgery. The patient has been reminded regarding the importance of dental hygiene and the lifelong need for antibiotic prophylaxis for all dental cleanings and other related invasive procedures.  All of his questions been addressed.  The patient will return to our office for routine follow-up next June, approximately 1 year following his surgery here he will call and return sooner should specific problems or questions arise.    Valentina Gu. Roxy Manns, MD 11/23/2016 9:35 AM

## 2016-11-25 ENCOUNTER — Telehealth: Payer: Self-pay | Admitting: Cardiology

## 2016-11-25 NOTE — Telephone Encounter (Signed)
Patient states that he is having a pain in his lung and would like to speak with nurse about it ;. / tg

## 2016-11-25 NOTE — Telephone Encounter (Signed)
Returned pt call after clinic was finished for the day. No answer, left message for. Pt to return call.

## 2016-11-26 NOTE — Telephone Encounter (Signed)
Spoke with pt who states that he had pain on the left side of the chest in the left lung area that started on Mon. Night and continued until Tues night. His pain then stopped. Pt states the pain has started again. Pt rates pain 5/10. No SOB, no sweating, no dizziness. BP is 128/90, HR 63. Incision  is tender to touch, not warm to touch, no drainage at this time. Patient states it has not changed from visit with Dr. Roxy Manns. Pt states he will call Dr. Roxy Manns if pain continues through the night. Instructed pt to be seen in ED if pain is worse.  Please advise.

## 2016-11-26 NOTE — Telephone Encounter (Signed)
Called pt. No answer, unable to leave message.  

## 2016-11-26 NOTE — Telephone Encounter (Signed)
It sounds like he needs to have someone look at his incision and perhaps get a follow-up chest x-ray. Please call the [facility] office and see if we can help him get a visit set up.

## 2016-11-27 ENCOUNTER — Telehealth: Payer: Self-pay

## 2016-11-27 NOTE — Telephone Encounter (Signed)
Called patient. No answer. Left message to call back.  

## 2016-11-27 NOTE — Telephone Encounter (Signed)
Awaiting call back from [facility].

## 2016-11-27 NOTE — Telephone Encounter (Signed)
Mr. Goyne has made contact with [facility] and make an appt if his pain reoccurs.

## 2016-11-27 NOTE — Telephone Encounter (Signed)
Charles [name] called about having shoulder pain the other day and had to take ASA to get relief. The shoulder pain now has resolved. His states no problems with his incision sites. He will call back to scheduled an appointment if pain re-occurs.

## 2016-12-10 DIAGNOSIS — H9313 Tinnitus, bilateral: Secondary | ICD-10-CM | POA: Diagnosis not present

## 2016-12-10 DIAGNOSIS — H903 Sensorineural hearing loss, bilateral: Secondary | ICD-10-CM | POA: Diagnosis not present

## 2016-12-16 ENCOUNTER — Ambulatory Visit (INDEPENDENT_AMBULATORY_CARE_PROVIDER_SITE_OTHER): Payer: Medicare Other | Admitting: Internal Medicine

## 2016-12-16 ENCOUNTER — Encounter: Payer: Self-pay | Admitting: Internal Medicine

## 2016-12-16 VITALS — BP 126/88 | HR 54 | Ht 72.0 in | Wt 227.8 lb

## 2016-12-16 DIAGNOSIS — I110 Hypertensive heart disease with heart failure: Secondary | ICD-10-CM

## 2016-12-16 DIAGNOSIS — I428 Other cardiomyopathies: Secondary | ICD-10-CM

## 2016-12-16 DIAGNOSIS — I472 Ventricular tachycardia, unspecified: Secondary | ICD-10-CM

## 2016-12-16 DIAGNOSIS — I5020 Unspecified systolic (congestive) heart failure: Secondary | ICD-10-CM

## 2016-12-16 DIAGNOSIS — I330 Acute and subacute infective endocarditis: Secondary | ICD-10-CM

## 2016-12-16 DIAGNOSIS — R001 Bradycardia, unspecified: Secondary | ICD-10-CM | POA: Diagnosis not present

## 2016-12-16 NOTE — Patient Instructions (Addendum)
Medication Instructions:  Your physician has recommended you make the following change in your medication:  1) Stop Amiodarone    Labwork: None ordered   Testing/Procedures: None ordered   Follow-Up: Your physician recommends that you schedule a follow-up appointment in: 6 weeks with Dr Rayann Heman

## 2016-12-16 NOTE — Progress Notes (Signed)
PCP: [name], MD Primary Cardiologist: Dr Domenic Polite Primary EP: Dr Rayann Heman Surgery:  Dr Lynetta Mare Tat is a 71 y.o. male who presents today for routine electrophysiology followup.  Since last being seen in our clinic, the patient reports doing very well.  Today, he denies symptoms of palpitations, chest pain, shortness of breath,  lower extremity edema, dizziness, presyncope, or syncope.  The patient is otherwise without complaint today.   Past Medical History:  Diagnosis Date  . Anemia   . Aortic insufficiency    a. 07/07/2016: TEE showing bicuspid aortic valve with severe eccentric AI   . Ascending aortic aneurysm (HCC)    4.4 cm by CT 06/2016  . 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 Bicuspid aortic valve   . Chronic periodontitis   . Coronary artery disease   . Pneumonia 1978  . 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 S/P patent foramen ovale closure 10/02/2016   Past Surgical History:  Procedure Laterality Date  . AORTIC VALVE REPLACEMENT N/A 10/02/2016   Procedure: AORTIC ROOT REPLACEMENT  - using 62mm Allograft;  Surgeon: [name], MD;  Location: [facility];  Service: Open Heart Surgery;  Laterality: N/A;  . CORONARY ARTERY BYPASS GRAFT N/A 10/02/2016   Procedure: CORONARY ARTERY BYPASS GRAFTING (CABG) x one, using right leg greater saphenous vein harvested endoscopically;  Surgeon: [name], MD;  Location: [facility];  Service: Open Heart Surgery;  Laterality: N/A;  . FALSE ANEURYSM REPAIR  10/02/2016   Procedure: REPAIR FALSE ANEURYSM of Aortic Root Resection and Grafting of Aortic Root;  Surgeon: [name], MD;  Location: [facility];  Service: Open  Heart Surgery;;  . HERNIA REPAIR Left   . LEFT HEART CATH AND CORS/GRAFTS ANGIOGRAPHY N/A 10/07/2016   Procedure: Left Heart Cath and Cors/Grafts Angiography;  Surgeon: Burnell Blanks, MD;  Location: [facility];  Service: Cardiovascular;  Laterality: N/A;  . MULTIPLE EXTRACTIONS WITH ALVEOLOPLASTY N/A 08/13/2016   Procedure: Extraction of tooth #'s 2,4,14 with alveoloplasty;  Surgeon: Lenn Cal, DDS;  Location: [facility];  Service: Oral Surgery;  Laterality: N/A;  . PATENT FORAMEN OVALE(PFO) CLOSURE N/A 10/02/2016   Procedure: PATENT FORAMEN OVALE (PFO) CLOSURE;  Surgeon: [name], MD;  Location: [facility];  Service: Open Heart Surgery;  Laterality: N/A;  . RIGHT/LEFT HEART CATH AND CORONARY ANGIOGRAPHY N/A 08/03/2016   Procedure: Right/Left Heart Cath and Coronary Angiography;  Surgeon: Burnell Blanks, MD;  Location: [facility];  Service: Cardiovascular;  Laterality: N/A;  . TEE WITHOUT CARDIOVERSION N/A 07/07/2016   Procedure: TRANSESOPHAGEAL ECHOCARDIOGRAM (TEE);  Surgeon: Larey Dresser, MD;  Location: [facility];  Service: Cardiovascular;  Laterality: N/A;  . TEE WITHOUT CARDIOVERSION N/A 10/02/2016   Procedure: TRANSESOPHAGEAL ECHOCARDIOGRAM (TEE);  Surgeon: [name], MD;  Location: [facility];  Service: Open Heart Surgery;  Laterality: N/A;  . THORACIC AORTIC ANEURYSM REPAIR N/A 10/02/2016   Procedure: THORACIC ASCENDING ANEURYSM REPAIR  - using 34mm Allograft;  Reimplantation of Left Coronary Button;  Surgeon: [name], MD;  Location: [facility];  Service: Open Heart Surgery;  Laterality:  N/A;    ROS- all systems are reviewed and negatives except as per HPI above  Current Outpatient Prescriptions  Medication Sig Dispense Refill  . aspirin EC 325 MG EC tablet Take 1 tablet (325 mg total) by mouth daily. 30 tablet 0  . atorvastatin (LIPITOR) 20 MG tablet Take 20 mg by mouth daily.    . ferrous sulfate 325 (65 FE) MG tablet Take 1 tablet (325 mg  total) by mouth daily.    . furosemide (LASIX) 40 MG tablet Take 1 tablet (40 mg total) by mouth daily. 30 tablet 2  . metoprolol tartrate (LOPRESSOR) 25 MG tablet Take 1 tablet (25 mg total) by mouth 2 (two) times daily. Hold for HR less than 50    . Multiple Vitamin (MULTIVITAMIN WITH MINERALS) TABS tablet Take 1 tablet by mouth daily.    . potassium chloride SA (K-DUR,KLOR-CON) 20 MEQ tablet Take 1 tablet (20 mEq total) by mouth daily.     No current facility-administered medications for this visit.     Physical Exam: Vitals:   12/16/16 0934  BP: 126/88  Pulse: (!) 54  SpO2: 95%  Weight: 227 lb 12.8 oz (103.3 kg)  Height: 6' (1.829 m)    GEN- The patient is well appearing, alert and oriented x 3 today.   Head- normocephalic, atraumatic Eyes-  Sclera clear, conjunctiva pink Ears- hearing intact Oropharynx- clear Lungs- Clear to ausculation bilaterally, normal work of breathing Heart- Regular rate and rhythm, no murmurs, rubs or gallops, PMI not laterally displaced GI- soft, NT, ND, + BS Extremities- no clubbing, cyanosis, or edema  LifeVest interrogation today reveals: no arrhythmias, wear time 98%, average HR 62 bpm   Assessment and Plan:  1. Polymorphic VT Doing well off of mexiletine Stop amiodarone Keep lifevest in place Return in 6 weeks to determine if we perform EPS or simply stop lifevest.  He is leaning towards discontinuing lifevest without EPS at this time.  We have discussed at length.  Given prior endocarditis, I am not enthusiastic about EP devices for him.  2. Sinus bradycardia Asymptomatic  3. S/p AVR and aortic root homograft for endocarditis Followed by Dr Roxy Manns Doing well  4. CAD No ischemic symptoms No changes  5. Nonischemic CM EF 45% Medical optimization then repeat echo  6. Hypertensive cardiovascular disease Stable No change required today  Return in 6 weeks  Thompson Grayer MD, [facility] 12/16/2016 9:54 AM

## 2016-12-17 ENCOUNTER — Encounter (HOSPITAL_COMMUNITY): Payer: Self-pay

## 2016-12-17 ENCOUNTER — Encounter (HOSPITAL_COMMUNITY)
Admission: RE | Admit: 2016-12-17 | Discharge: 2016-12-17 | Disposition: A | Discharge status: Home / Self Care | Payer: Medicare Other | Source: Ambulatory Visit | Attending: Cardiology | Admitting: Cardiology

## 2016-12-17 VITALS — BP 100/56 | HR 61 | Ht 72.0 in | Wt 231.9 lb

## 2016-12-17 DIAGNOSIS — Z951 Presence of aortocoronary bypass graft: Secondary | ICD-10-CM

## 2016-12-17 DIAGNOSIS — Z952 Presence of prosthetic heart valve: Secondary | ICD-10-CM | POA: Insufficient documentation

## 2016-12-17 DIAGNOSIS — Q231 Congenital insufficiency of aortic valve: Secondary | ICD-10-CM | POA: Diagnosis not present

## 2016-12-17 DIAGNOSIS — Z87891 Personal history of nicotine dependence: Secondary | ICD-10-CM | POA: Diagnosis not present

## 2016-12-17 DIAGNOSIS — I472 Ventricular tachycardia: Secondary | ICD-10-CM | POA: Diagnosis not present

## 2016-12-17 DIAGNOSIS — Z954 Presence of other heart-valve replacement: Secondary | ICD-10-CM

## 2016-12-17 DIAGNOSIS — I339 Acute and subacute endocarditis, unspecified: Secondary | ICD-10-CM | POA: Diagnosis not present

## 2016-12-17 NOTE — Progress Notes (Signed)
Cardiac Individual Treatment Plan  Patient Details  Name: Charles [name] MRN: [id number] Date of Birth: 1945-12-04 Referring Provider:     CARDIAC REHAB PHASE II ORIENTATION from 12/17/2016 in [place]  Referring Provider  DR. McDowell      Initial Encounter Date:    CARDIAC REHAB PHASE II ORIENTATION from 12/17/2016 in [place]  Date  12/17/16  Referring Provider  DR. McDowell      Visit Diagnosis: S/P aortic valve replacement with allograft  S/P CABG x 1  Patient's Home Medications on Admission:  Current Outpatient Prescriptions:  .  aspirin EC 325 MG EC tablet, Take 1 tablet (325 mg total) by mouth daily. (Patient taking differently: Take 81 mg by mouth daily. ), Disp: 30 tablet, Rfl: 0 .  atorvastatin (LIPITOR) 20 MG tablet, Take 20 mg by mouth daily., Disp: , Rfl:  .  ferrous sulfate 325 (65 FE) MG tablet, Take 1 tablet (325 mg total) by mouth daily. (Patient not taking: Reported on 12/17/2016), Disp: , Rfl:  .  furosemide (LASIX) 40 MG tablet, Take 1 tablet (40 mg total) by mouth daily., Disp: 30 tablet, Rfl: 2 .  metoprolol tartrate (LOPRESSOR) 25 MG tablet, Take 1 tablet (25 mg total) by mouth 2 (two) times daily. Hold for HR less than 50, Disp: , Rfl:  .  Multiple Vitamin (MULTIVITAMIN WITH MINERALS) TABS tablet, Take 1 tablet by mouth daily., Disp: , Rfl:  .  potassium chloride SA (K-DUR,KLOR-CON) 20 MEQ tablet, Take 1 tablet (20 mEq total) by mouth daily., Disp: , Rfl:   Past Medical History: Past Medical History:  Diagnosis Date  . Anemia   . Aortic insufficiency    a. 07/07/2016: TEE showing bicuspid aortic valve with severe eccentric AI   . Ascending aortic aneurysm (HCC)    4.4 cm by CT 06/2016  . 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 Bicuspid aortic valve   . Chronic periodontitis   . Coronary artery disease   . Pneumonia 1978  . 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 S/P patent foramen ovale closure 10/02/2016    Tobacco Use: History  Smoking Status  . Former Smoker  . Types: Cigarettes  . Quit date: 05/01/1991  Smokeless Tobacco  . Never Used    Comment: Quit 30 years ago.      Labs: Recent Review Flowsheet Data    Labs for ITP Cardiac and Pulmonary Rehab Latest Ref Rng & Units 10/06/2016 10/07/2016 10/07/2016 10/07/2016 10/08/2016   Cholestrol 0 - 200 mg/dL - - - - -   LDLCALC 0 - 99 mg/dL - - - - -   HDL >40 mg/dL - - - - -   Trlycerides <150 mg/dL - - - - -   Hemoglobin A1c 4.8 - 5.6 % - - - - -   PHART 7.350 - 7.450 7.484(H) 7.499(H) - - -   PCO2ART 32.0 - 48.0 mmHg 41.6 44.4 - - -   HCO3 20.0 - 28.0 mmol/L 31.4(H) 34.6(H) - - -   TCO2 0 - 100 mmol/L 33 36 - - -   ACIDBASEDEF 0.0 - 2.0 mmol/L - - - - -  O2SAT % 92.0 98.0 65.1 95.4 65.4      Capillary Blood Glucose: Lab Results  Component Value Date   GLUCAP 147 (H) 10/05/2016   GLUCAP 97 10/05/2016   GLUCAP 110 (H) 10/05/2016   GLUCAP 90 10/05/2016   GLUCAP 98 10/05/2016     Exercise Target Goals: Date: 12/17/16  Exercise Program Goal: Individual exercise prescription set with THRR, safety & activity barriers. Participant demonstrates ability to understand and report RPE using BORG scale, to self-measure pulse accurately, and to acknowledge the importance of the exercise prescription.  Exercise Prescription Goal: Starting with aerobic activity 30 plus minutes a day, 3 days per week for initial exercise prescription. Provide home exercise prescription and guidelines that participant acknowledges understanding prior to discharge.  Activity Barriers & Risk Stratification:     Activity Barriers & Cardiac Risk  Stratification - 12/17/16 0957      Activity Barriers & Cardiac Risk Stratification   Activity Barriers Deconditioning;Incisional Pain   Cardiac Risk Stratification High      6 Minute Walk:     6 Minute Walk    Row Name 12/17/16 0956         6 Minute Walk   Phase Initial     Distance 1200 feet     Distance % Change 0 %     Distance Feet Change 0 ft     Walk Time 6 minutes     # of Rest Breaks 0     MPH 2.27     METS 2.74     RPE 9     Perceived Dyspnea  7     VO2 Peak 8.77     Symptoms No     Resting HR 61 bpm     Resting BP 100/56     Resting Oxygen Saturation  95 %     Exercise Oxygen Saturation  during 6 min walk 91 %     Max Ex. HR 96 bpm     Max Ex. BP 124/70     2 Minute Post BP 100/58        Oxygen Initial Assessment:   Oxygen Re-Evaluation:   Oxygen Discharge (Final Oxygen Re-Evaluation):   Initial Exercise Prescription:     Initial Exercise Prescription - 12/17/16 0800      Date of Initial Exercise RX and Referring Provider   Date 12/17/16   Referring Provider DR. McDowell     Treadmill   MPH 1.5   Grade 0   Minutes 15   METs 2.2     Recumbant Elliptical   Level 1   RPM 40   Watts 41   Minutes 20   METs 2.4     Prescription Details   Frequency (times per week) 3   Duration Progress to 30 minutes of continuous aerobic without signs/symptoms of physical distress     Intensity   THRR 40-80% of Max Heartrate [phone]   Ratings of Perceived Exertion 11-13   Perceived Dyspnea 0-4     Progression   Progression Continue progressive overload as per policy without signs/symptoms or physical distress.     Resistance Training   Training Prescription Yes   Weight 1   Reps 10-15      Perform Capillary Blood Glucose checks as needed.  Exercise Prescription Changes:   Exercise Comments:   Exercise Goals and Review:      Exercise Goals    Row Name 12/17/16 [phone]  Exercise Goals   Increase Physical  Activity Yes       Intervention Provide advice, education, support and counseling about physical activity/exercise needs.;Develop an individualized exercise prescription for aerobic and resistive training based on initial evaluation findings, risk stratification, comorbidities and participant's personal goals.       Expected Outcomes Achievement of increased cardiorespiratory fitness and enhanced flexibility, muscular endurance and strength shown through measurements of functional capacity and personal statement of participant.       Increase Strength and Stamina Yes       Intervention Provide advice, education, support and counseling about physical activity/exercise needs.;Develop an individualized exercise prescription for aerobic and resistive training based on initial evaluation findings, risk stratification, comorbidities and participant's personal goals.       Expected Outcomes Achievement of increased cardiorespiratory fitness and enhanced flexibility, muscular endurance and strength shown through measurements of functional capacity and personal statement of participant.       Able to understand and use rate of perceived exertion (RPE) scale Yes       Intervention Provide education and explanation on how to use RPE scale       Expected Outcomes Short Term: Able to use RPE daily in rehab to express subjective intensity level;Long Term:  Able to use RPE to guide intensity level when exercising independently       Able to understand and use Dyspnea scale Yes       Intervention Provide education and explanation on how to use Dyspnea scale       Expected Outcomes Short Term: Able to use Dyspnea scale daily in rehab to express subjective sense of shortness of breath during exertion;Long Term: Able to use Dyspnea scale to guide intensity level when exercising independently       Knowledge and understanding of Target Heart Rate Range (THRR) Yes       Intervention Provide education and explanation of THRR  including how the numbers were predicted and where they are located for reference       Expected Outcomes Short Term: Able to state/look up THRR       Able to check pulse independently Yes       Intervention Provide education and demonstration on how to check pulse in carotid and radial arteries.;Review the importance of being able to check your own pulse for safety during independent exercise       Expected Outcomes Short Term: Able to explain why pulse checking is important during independent exercise;Long Term: Able to check pulse independently and accurately       Understanding of Exercise Prescription Yes       Intervention Provide education, explanation, and written materials on patient's individual exercise prescription       Expected Outcomes Short Term: Able to explain program exercise prescription;Long Term: Able to explain home exercise prescription to exercise independently          Exercise Goals Re-Evaluation :    Discharge Exercise Prescription (Final Exercise Prescription Changes):   Nutrition:  Target Goals: Understanding of nutrition guidelines, daily intake of sodium 1500mg , cholesterol 200mg , calories 30% from fat and 7% or less from saturated fats, daily to have 5 or more servings of fruits and vegetables.  Biometrics:     Pre Biometrics - 12/17/16 0841      Pre Biometrics   Height 6' (1.829 m)   Weight 231 lb 14.8 oz (105.2 kg)   Waist Circumference 44 inches   Hip Circumference 46.5 inches   Waist  to Hip Ratio 0.95 %   BMI (Calculated) 31.45   Triceps Skinfold 16 mm   % Body Fat 30.7 %   Grip Strength 83.2 kg   Flexibility 14 in   Single Leg Stand 9 seconds       Nutrition Therapy Plan and Nutrition Goals:   Nutrition Discharge: Rate Your Plate Scores:   Nutrition Goals Re-Evaluation:   Nutrition Goals Discharge (Final Nutrition Goals Re-Evaluation):   Psychosocial: Target Goals: Acknowledge presence or absence of significant depression  and/or stress, maximize coping skills, provide positive support system. Participant is able to verbalize types and ability to use techniques and skills needed for reducing stress and depression.  Initial Review & Psychosocial Screening:     Initial Psych Review & Screening - 12/17/16 1134      Initial Review   Current issues with None Identified     Family Dynamics   Good Support System? Yes     Barriers   Psychosocial barriers to participate in program There are no identifiable barriers or psychosocial needs.     Screening Interventions   Interventions Encouraged to exercise      Quality of Life Scores:     Quality of Life - 12/17/16 0842      Quality of Life Scores   Health/Function Pre 17.77 %   Socioeconomic Pre 16.36 %   Psych/Spiritual Pre 20.79 %   Family Pre 22.2 %   GLOBAL Pre 18.75 %      PHQ-9: Recent Review Flowsheet Data    Depression screen [facility] 2/9 12/17/2016 09/08/2016 07/28/2016   Decreased Interest - 0 0   Down, Depressed, Hopeless - 0 0   PHQ - 2 Score - 0 0   Altered sleeping 0 - -   Tired, decreased energy 0 - -   Change in appetite 0 - -   Feeling bad or failure about yourself  1 - -   Trouble concentrating 0 - -   Moving slowly or fidgety/restless 0 - -   Suicidal thoughts 0 - -   Difficult doing work/chores Somewhat difficult - -     Interpretation of Total Score  Total Score Depression Severity:  1-4 = Minimal depression, 5-9 = Mild depression, 10-14 = Moderate depression, 15-19 = Moderately severe depression, 20-27 = Severe depression   Psychosocial Evaluation and Intervention:     Psychosocial Evaluation - 12/17/16 1146      Psychosocial Evaluation & Interventions   Interventions Encouraged to exercise with the program and follow exercise prescription   Continue Psychosocial Services  No Follow up required      Psychosocial Re-Evaluation:   Psychosocial Discharge (Final Psychosocial Re-Evaluation):   Vocational  Rehabilitation: Provide vocational rehab assistance to qualifying candidates.   Vocational Rehab Evaluation & Intervention:     Vocational Rehab - 12/17/16 1130      Initial Vocational Rehab Evaluation & Intervention   Assessment shows need for Vocational Rehabilitation No      Education: Education Goals: Education classes will be provided on a weekly basis, covering required topics. Participant will state understanding/return demonstration of topics presented.  Learning Barriers/Preferences:     Learning Barriers/Preferences - 12/17/16 1128      Learning Barriers/Preferences   Learning Barriers None   Learning Preferences Individual Instruction;Group Instruction;Skilled Demonstration      Education Topics: Hypertension, Hypertension Reduction -Define heart disease and high blood pressure. Discus how high blood pressure affects the body and ways to reduce high blood pressure.  Exercise and Your Heart -Discuss why it is important to exercise, the FITT principles of exercise, normal and abnormal responses to exercise, and how to exercise safely.   Angina -Discuss definition of angina, causes of angina, treatment of angina, and how to decrease risk of having angina.   Cardiac Medications -Review what the following cardiac medications are used for, how they affect the body, and side effects that may occur when taking the medications.  Medications include Aspirin, Beta blockers, calcium channel blockers, ACE Inhibitors, angiotensin receptor blockers, diuretics, digoxin, and antihyperlipidemics.   Congestive Heart Failure -Discuss the definition of CHF, how to live with CHF, the signs and symptoms of CHF, and how keep track of weight and sodium intake.   Heart Disease and Intimacy -Discus the effect sexual activity has on the heart, how changes occur during intimacy as we age, and safety during sexual activity.   Smoking Cessation / COPD -Discuss different methods to  quit smoking, the health benefits of quitting smoking, and the definition of COPD.   Nutrition I: Fats -Discuss the types of cholesterol, what cholesterol does to the heart, and how cholesterol levels can be controlled.   Nutrition II: Labels -Discuss the different components of food labels and how to read food label   Heart Parts and Heart Disease -Discuss the anatomy of the heart, the pathway of blood circulation through the heart, and these are affected by heart disease.   Stress I: Signs and Symptoms -Discuss the causes of stress, how stress may lead to anxiety and depression, and ways to limit stress.   Stress II: Relaxation -Discuss different types of relaxation techniques to limit stress.   Warning Signs of Stroke / TIA -Discuss definition of a stroke, what the signs and symptoms are of a stroke, and how to identify when someone is having stroke.   Knowledge Questionnaire Score:     Knowledge Questionnaire Score - 12/17/16 1130      Knowledge Questionnaire Score   Pre Score 21/24      Core Components/Risk Factors/Patient Goals at Admission:     Personal Goals and Risk Factors at Admission - 12/17/16 1132      Core Components/Risk Factors/Patient Goals on Admission    Weight Management Weight Maintenance   Improve shortness of breath with ADL's Yes   Intervention Provide education, individualized exercise plan and daily activity instruction to help decrease symptoms of SOB with activities of daily living.   Expected Outcomes Short Term: Achieves a reduction of symptoms when performing activities of daily living.   Personal Goal Other Yes   Personal Goal Full mobility, get back to driving   Intervention Attend CR 3 x week and supplement with 2 days of home exercise.    Expected Outcomes Reach goals.       Core Components/Risk Factors/Patient Goals Review:    Core Components/Risk Factors/Patient Goals at Discharge (Final Review):    ITP Comments:      ITP Comments    Row Name 12/17/16 1523           ITP Comments Patient new to program. Plans to start Monday 12/21/16.          Comments: ITP 30 Day REVIEW Patient new to program. Plans to start Monday 12/21/16.

## 2016-12-17 NOTE — Progress Notes (Signed)
Cardiac/Pulmonary Rehab Medication Review by a Pharmacist  Does the patient  feel that his/her medications are working for him/her?  yes  Has the patient been experiencing any side effects to the medications prescribed?  no  Does the patient measure his/her own blood pressure or blood glucose at home?  yes   Does the patient have any problems obtaining medications due to transportation or finances?   no  Understanding of regimen: excellent Understanding of indications: good Potential of compliance: excellent  Questions asked to Determine Patient Understanding of Medication Regimen:  1. What is the name of the medication?  2. What is the medication used for?  3. When should it be taken?  4. How much should be taken?  5. How will you take it?  6. What side effects should you report?  Understanding Defined as: Excellent: All questions above are correct Good: Questions 1-4 are correct Fair: Questions 1-2 are correct  Poor: 1 or none of the above questions are correct   Pharmacist comments: 71 yo male with recent cardiac surgery. Reviewed current medications with patient and tolerating. Patient compliant with current regimen. Monitor BP and heart rate. Continue current regimen.  Thanks for the opportunity to participate in the care of this patient,  Charles [name], BS [place], BCPS Clinical Pharmacist Pager [phone]     Cristy Friedlander 12/17/2016 9:36 AM

## 2016-12-17 NOTE — Progress Notes (Signed)
Cardiac Individual Treatment Plan  Patient Details  Name: Charles [name] MRN: [id number] Date of Birth: [birth year] Referring Provider:     CARDIAC REHAB PHASE II ORIENTATION from 12/17/2016 in [place]  Referring Provider  DR. McDowell      Initial Encounter Date:    CARDIAC REHAB PHASE II ORIENTATION from 12/17/2016 in [place]  Date  12/17/16  Referring Provider  DR. McDowell      Visit Diagnosis: S/P aortic valve replacement with allograft  S/P CABG x 1  Patient's Home Medications on Admission:  Current Outpatient Prescriptions:  .  aspirin EC 325 MG EC tablet, Take 1 tablet (325 mg total) by mouth daily. (Patient taking differently: Take 81 mg by mouth daily. ), Disp: 30 tablet, Rfl: 0 .  atorvastatin (LIPITOR) 20 MG tablet, Take 20 mg by mouth daily., Disp: , Rfl:  .  ferrous sulfate 325 (65 FE) MG tablet, Take 1 tablet (325 mg total) by mouth daily. (Patient not taking: Reported on 12/17/2016), Disp: , Rfl:  .  furosemide (LASIX) 40 MG tablet, Take 1 tablet (40 mg total) by mouth daily., Disp: 30 tablet, Rfl: 2 .  metoprolol tartrate (LOPRESSOR) 25 MG tablet, Take 1 tablet (25 mg total) by mouth 2 (two) times daily. Hold for HR less than 50, Disp: , Rfl:  .  Multiple Vitamin (MULTIVITAMIN WITH MINERALS) TABS tablet, Take 1 tablet by mouth daily., Disp: , Rfl:  .  potassium chloride SA (K-DUR,KLOR-CON) 20 MEQ tablet, Take 1 tablet (20 mEq total) by mouth daily., Disp: , Rfl:   Past Medical History: Past Medical History:  Diagnosis Date  . Anemia   . Aortic insufficiency    a. 07/07/2016: TEE showing bicuspid aortic valve with severe eccentric AI   . Ascending aortic aneurysm (HCC)    4.4 cm by CT 06/2016  . 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 Bicuspid aortic valve   . Chronic periodontitis   . Coronary artery disease   . Pneumonia 1978  . 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 S/P patent foramen ovale closure 10/02/2016    Tobacco Use: History  Smoking Status  . Former Smoker  . Types: Cigarettes  . Quit date: 05/01/1991  Smokeless Tobacco  . Never Used    Comment: Quit 30 years ago.      Labs: Recent Review Flowsheet Data    Labs for ITP Cardiac and Pulmonary Rehab Latest Ref Rng & Units 10/06/2016 10/07/2016 10/07/2016 10/07/2016 10/08/2016   Cholestrol 0 - 200 mg/dL - - - - -   LDLCALC 0 - 99 mg/dL - - - - -   HDL >40 mg/dL - - - - -   Trlycerides <150 mg/dL - - - - -   Hemoglobin A1c 4.8 - 5.6 % - - - - -   PHART 7.350 - 7.450 7.484(H) 7.499(H) - - -   PCO2ART 32.0 - 48.0 mmHg 41.6 44.4 - - -   HCO3 20.0 - 28.0 mmol/L 31.4(H) 34.6(H) - - -   TCO2 0 - 100 mmol/L 33 36 - - -   ACIDBASEDEF 0.0 - 2.0 mmol/L - - - - -  O2SAT % 92.0 98.0 65.1 95.4 65.4      Capillary Blood Glucose: Lab Results  Component Value Date   GLUCAP 147 (H) 10/05/2016   GLUCAP 97 10/05/2016   GLUCAP 110 (H) 10/05/2016   GLUCAP 90 10/05/2016   GLUCAP 98 10/05/2016     Exercise Target Goals: Date: 12/17/16  Exercise Program Goal: Individual exercise prescription set with THRR, safety & activity barriers. Participant demonstrates ability to understand and report RPE using BORG scale, to self-measure pulse accurately, and to acknowledge the importance of the exercise prescription.  Exercise Prescription Goal: Starting with aerobic activity 30 plus minutes a day, 3 days per week for initial exercise prescription. Provide home exercise prescription and guidelines that participant acknowledges understanding prior to discharge.  Activity Barriers & Risk Stratification:     Activity Barriers & Cardiac Risk  Stratification - 12/17/16 0957      Activity Barriers & Cardiac Risk Stratification   Activity Barriers Deconditioning;Incisional Pain   Cardiac Risk Stratification High      6 Minute Walk:     6 Minute Walk    Row Name 12/17/16 0956         6 Minute Walk   Phase Initial     Distance 1200 feet     Distance % Change 0 %     Distance Feet Change 0 ft     Walk Time 6 minutes     # of Rest Breaks 0     MPH 2.27     METS 2.74     RPE 9     Perceived Dyspnea  7     VO2 Peak 8.77     Symptoms No     Resting HR 61 bpm     Resting BP 100/56     Resting Oxygen Saturation  95 %     Exercise Oxygen Saturation  during 6 min walk 91 %     Max Ex. HR 96 bpm     Max Ex. BP 124/70     2 Minute Post BP 100/58        Oxygen Initial Assessment:   Oxygen Re-Evaluation:   Oxygen Discharge (Final Oxygen Re-Evaluation):   Initial Exercise Prescription:     Initial Exercise Prescription - 12/17/16 0800      Date of Initial Exercise RX and Referring Provider   Date 12/17/16   Referring Provider DR. McDowell     Treadmill   MPH 1.5   Grade 0   Minutes 15   METs 2.2     Recumbant Elliptical   Level 1   RPM 40   Watts 41   Minutes 20   METs 2.4     Prescription Details   Frequency (times per week) 3   Duration Progress to 30 minutes of continuous aerobic without signs/symptoms of physical distress     Intensity   THRR 40-80% of Max Heartrate [phone]   Ratings of Perceived Exertion 11-13   Perceived Dyspnea 0-4     Progression   Progression Continue progressive overload as per policy without signs/symptoms or physical distress.     Resistance Training   Training Prescription Yes   Weight 1   Reps 10-15      Perform Capillary Blood Glucose checks as needed.  Exercise Prescription Changes:   Exercise Comments:   Exercise Goals and Review:      Exercise Goals    Row Name 12/17/16 [phone]  Exercise Goals   Increase Physical  Activity Yes       Intervention Provide advice, education, support and counseling about physical activity/exercise needs.;Develop an individualized exercise prescription for aerobic and resistive training based on initial evaluation findings, risk stratification, comorbidities and participant's personal goals.       Expected Outcomes Achievement of increased cardiorespiratory fitness and enhanced flexibility, muscular endurance and strength shown through measurements of functional capacity and personal statement of participant.       Increase Strength and Stamina Yes       Intervention Provide advice, education, support and counseling about physical activity/exercise needs.;Develop an individualized exercise prescription for aerobic and resistive training based on initial evaluation findings, risk stratification, comorbidities and participant's personal goals.       Expected Outcomes Achievement of increased cardiorespiratory fitness and enhanced flexibility, muscular endurance and strength shown through measurements of functional capacity and personal statement of participant.       Able to understand and use rate of perceived exertion (RPE) scale Yes       Intervention Provide education and explanation on how to use RPE scale       Expected Outcomes Short Term: Able to use RPE daily in rehab to express subjective intensity level;Long Term:  Able to use RPE to guide intensity level when exercising independently       Able to understand and use Dyspnea scale Yes       Intervention Provide education and explanation on how to use Dyspnea scale       Expected Outcomes Short Term: Able to use Dyspnea scale daily in rehab to express subjective sense of shortness of breath during exertion;Long Term: Able to use Dyspnea scale to guide intensity level when exercising independently       Knowledge and understanding of Target Heart Rate Range (THRR) Yes       Intervention Provide education and explanation of THRR  including how the numbers were predicted and where they are located for reference       Expected Outcomes Short Term: Able to state/look up THRR       Able to check pulse independently Yes       Intervention Provide education and demonstration on how to check pulse in carotid and radial arteries.;Review the importance of being able to check your own pulse for safety during independent exercise       Expected Outcomes Short Term: Able to explain why pulse checking is important during independent exercise;Long Term: Able to check pulse independently and accurately       Understanding of Exercise Prescription Yes       Intervention Provide education, explanation, and written materials on patient's individual exercise prescription       Expected Outcomes Short Term: Able to explain program exercise prescription;Long Term: Able to explain home exercise prescription to exercise independently          Exercise Goals Re-Evaluation :    Discharge Exercise Prescription (Final Exercise Prescription Changes):   Nutrition:  Target Goals: Understanding of nutrition guidelines, daily intake of sodium 1500mg , cholesterol 200mg , calories 30% from fat and 7% or less from saturated fats, daily to have 5 or more servings of fruits and vegetables.  Biometrics:     Pre Biometrics - 12/17/16 0841      Pre Biometrics   Height 6' (1.829 m)   Weight 231 lb 14.8 oz (105.2 kg)   Waist Circumference 44 inches   Hip Circumference 46.5 inches   Waist  to Hip Ratio 0.95 %   BMI (Calculated) 31.45   Triceps Skinfold 16 mm   % Body Fat 30.7 %   Grip Strength 83.2 kg   Flexibility 14 in   Single Leg Stand 9 seconds       Nutrition Therapy Plan and Nutrition Goals:   Nutrition Discharge: Rate Your Plate Scores:   Nutrition Goals Re-Evaluation:   Nutrition Goals Discharge (Final Nutrition Goals Re-Evaluation):   Psychosocial: Target Goals: Acknowledge presence or absence of significant depression  and/or stress, maximize coping skills, provide positive support system. Participant is able to verbalize types and ability to use techniques and skills needed for reducing stress and depression.  Initial Review & Psychosocial Screening:     Initial Psych Review & Screening - 12/17/16 1134      Initial Review   Current issues with None Identified     Family Dynamics   Good Support System? Yes     Barriers   Psychosocial barriers to participate in program There are no identifiable barriers or psychosocial needs.     Screening Interventions   Interventions Encouraged to exercise      Quality of Life Scores:     Quality of Life - 12/17/16 0842      Quality of Life Scores   Health/Function Pre 17.77 %   Socioeconomic Pre 16.36 %   Psych/Spiritual Pre 20.79 %   Family Pre 22.2 %   GLOBAL Pre 18.75 %      PHQ-9: Recent Review Flowsheet Data    Depression screen [facility] 2/9 12/17/2016 09/08/2016 07/28/2016   Decreased Interest - 0 0   Down, Depressed, Hopeless - 0 0   PHQ - 2 Score - 0 0   Altered sleeping 0 - -   Tired, decreased energy 0 - -   Change in appetite 0 - -   Feeling bad or failure about yourself  1 - -   Trouble concentrating 0 - -   Moving slowly or fidgety/restless 0 - -   Suicidal thoughts 0 - -   Difficult doing work/chores Somewhat difficult - -     Interpretation of Total Score  Total Score Depression Severity:  1-4 = Minimal depression, 5-9 = Mild depression, 10-14 = Moderate depression, 15-19 = Moderately severe depression, 20-27 = Severe depression   Psychosocial Evaluation and Intervention:     Psychosocial Evaluation - 12/17/16 1146      Psychosocial Evaluation & Interventions   Interventions Encouraged to exercise with the program and follow exercise prescription   Continue Psychosocial Services  No Follow up required      Psychosocial Re-Evaluation:   Psychosocial Discharge (Final Psychosocial Re-Evaluation):   Vocational  Rehabilitation: Provide vocational rehab assistance to qualifying candidates.   Vocational Rehab Evaluation & Intervention:     Vocational Rehab - 12/17/16 1130      Initial Vocational Rehab Evaluation & Intervention   Assessment shows need for Vocational Rehabilitation No      Education: Education Goals: Education classes will be provided on a weekly basis, covering required topics. Participant will state understanding/return demonstration of topics presented.  Learning Barriers/Preferences:     Learning Barriers/Preferences - 12/17/16 1128      Learning Barriers/Preferences   Learning Barriers None   Learning Preferences Individual Instruction;Group Instruction;Skilled Demonstration      Education Topics: Hypertension, Hypertension Reduction -Define heart disease and high blood pressure. Discus how high blood pressure affects the body and ways to reduce high blood pressure.  Exercise and Your Heart -Discuss why it is important to exercise, the FITT principles of exercise, normal and abnormal responses to exercise, and how to exercise safely.   Angina -Discuss definition of angina, causes of angina, treatment of angina, and how to decrease risk of having angina.   Cardiac Medications -Review what the following cardiac medications are used for, how they affect the body, and side effects that may occur when taking the medications.  Medications include Aspirin, Beta blockers, calcium channel blockers, ACE Inhibitors, angiotensin receptor blockers, diuretics, digoxin, and antihyperlipidemics.   Congestive Heart Failure -Discuss the definition of CHF, how to live with CHF, the signs and symptoms of CHF, and how keep track of weight and sodium intake.   Heart Disease and Intimacy -Discus the effect sexual activity has on the heart, how changes occur during intimacy as we age, and safety during sexual activity.   Smoking Cessation / COPD -Discuss different methods to  quit smoking, the health benefits of quitting smoking, and the definition of COPD.   Nutrition I: Fats -Discuss the types of cholesterol, what cholesterol does to the heart, and how cholesterol levels can be controlled.   Nutrition II: Labels -Discuss the different components of food labels and how to read food label   Heart Parts and Heart Disease -Discuss the anatomy of the heart, the pathway of blood circulation through the heart, and these are affected by heart disease.   Stress I: Signs and Symptoms -Discuss the causes of stress, how stress may lead to anxiety and depression, and ways to limit stress.   Stress II: Relaxation -Discuss different types of relaxation techniques to limit stress.   Warning Signs of Stroke / TIA -Discuss definition of a stroke, what the signs and symptoms are of a stroke, and how to identify when someone is having stroke.   Knowledge Questionnaire Score:     Knowledge Questionnaire Score - 12/17/16 1130      Knowledge Questionnaire Score   Pre Score 21/24      Core Components/Risk Factors/Patient Goals at Admission:     Personal Goals and Risk Factors at Admission - 12/17/16 1132      Core Components/Risk Factors/Patient Goals on Admission    Weight Management Weight Maintenance   Improve shortness of breath with ADL's Yes   Intervention Provide education, individualized exercise plan and daily activity instruction to help decrease symptoms of SOB with activities of daily living.   Expected Outcomes Short Term: Achieves a reduction of symptoms when performing activities of daily living.   Personal Goal Other Yes   Personal Goal Full mobility, get back to driving   Intervention Attend CR 3 x week and supplement with 2 days of home exercise.    Expected Outcomes Reach goals.       Core Components/Risk Factors/Patient Goals Review:    Core Components/Risk Factors/Patient Goals at Discharge (Final Review):    ITP  Comments:   Comments: Patient arrived for 1st visit/orientation/education at 0800. Patient was referred to CR by Dr. Domenic Polite due to AVR (Z95.4) and CABGx1 (Z95.1). During orientation advised patient on arrival and appointment times what to wear, what to do before, during and after exercise. Reviewed attendance and class policy. Talked about inclement weather and class consultation policy. Pt is scheduled to return Cardiac Rehab on 12/21/2016 at 11:00. Pt was advised to come to class 15 minutes before class starts. Patient was also given instructions on meeting with the dietician and attending the Family Structure classes. Discussed  RPE/Dpysnea scales. Discussed initial THR and how to find their radial and/or carotid pulse. Discussed the initial exercise prescription and how this effects their progress. Pt is eager to get started. Patient participated in warm up stretches followed by light weights and resistance bands. Patient was able to complete 6 minute walk test. Patient did not complain of any pain during test. Patient was measured for the equipment. Discussed equipment safety with patient. Took patient pre-anthropometric measurements. Patient finished visit at 10:30.

## 2016-12-17 NOTE — Progress Notes (Signed)
Daily Session Note  Patient Details  Name: RALIEGH SCOBIE MRN: [id number] Date of Birth: 16-Dec-1945 Referring Provider:     CARDIAC REHAB PHASE II ORIENTATION from 12/17/2016 in [place]  Referring Provider  DR. Domenic Polite      Encounter Date: 12/17/2016  Check In:     Session Check In - 12/17/16 0800      Check-In   Location [facility]   Staff Present Ireoluwa Grant Angelina Pih, MS, EP, [facility], Exercise Physiologist;Gregory Luther Parody, BS, EP, Exercise Physiologist;Debra Wynetta Emery, RN, BSN   Supervising physician immediately available to respond to emergencies See telemetry face sheet for immediately available MD   Medication changes reported     No   Fall or balance concerns reported    No   Tobacco Cessation --  Quit 1993   Warm-up and Cool-down Performed as group-led instruction   Resistance Training Performed Yes   VAD Patient? No     Pain Assessment   Currently in Pain? No/denies   Pain Score 0-No pain   Multiple Pain Sites No      Capillary Blood Glucose: No results found for this or any previous visit (from the past 24 hour(s)).    History  Smoking Status  . Former Smoker  . Types: Cigarettes  . Quit date: 05/01/1991  Smokeless Tobacco  . Never Used    Comment: Quit 30 years ago.      Goals Met:  Independence with exercise equipment Improved SOB with ADL's Exercise tolerated well No report of cardiac concerns or symptoms Strength training completed today  Goals Unmet:  Not Applicable  Comments: Check out:10:30    Dr. Kate Sable is Medical Director for [place] and Pulmonary Rehab.

## 2016-12-21 ENCOUNTER — Telehealth: Payer: Self-pay | Admitting: Internal Medicine

## 2016-12-21 ENCOUNTER — Encounter (HOSPITAL_COMMUNITY)
Admission: RE | Admit: 2016-12-21 | Discharge: 2016-12-21 | Disposition: A | Discharge status: Home / Self Care | Payer: Medicare Other | Source: Ambulatory Visit | Attending: Cardiology | Admitting: Cardiology

## 2016-12-21 DIAGNOSIS — Z954 Presence of other heart-valve replacement: Secondary | ICD-10-CM

## 2016-12-21 DIAGNOSIS — Z952 Presence of prosthetic heart valve: Secondary | ICD-10-CM | POA: Diagnosis not present

## 2016-12-21 NOTE — Progress Notes (Signed)
Daily Session Note  Patient Details  Name: Charles [name] MRN: [id number] Date of Birth: 12-06-1945 Referring Provider:     CARDIAC REHAB PHASE II ORIENTATION from 12/17/2016 in [place]  Referring Provider  DR. Domenic Polite      Encounter Date: 12/21/2016  Check In:     Session Check In - 12/21/16 1111      Check-In   Location [facility]   Staff Present Aundra Dubin, RN, BSN;Othman Masur Luther Parody, BS, EP, Exercise Physiologist   Supervising physician immediately available to respond to emergencies See telemetry face sheet for immediately available MD   Medication changes reported     No   Fall or balance concerns reported    No   Warm-up and Cool-down Performed as group-led instruction   Resistance Training Performed Yes   VAD Patient? No     Pain Assessment   Currently in Pain? No/denies   Pain Score 0-No pain   Multiple Pain Sites No      Capillary Blood Glucose: No results found for this or any previous visit (from the past 24 hour(s)).    History  Smoking Status  . Former Smoker  . Types: Cigarettes  . Quit date: 05/01/1991  Smokeless Tobacco  . Never Used    Comment: Quit 30 years ago.      Goals Met:  Independence with exercise equipment Exercise tolerated well No report of cardiac concerns or symptoms Strength training completed today  Goals Unmet:  Not Applicable  Comments: Check out 1200   Dr. Kate Sable is Medical Director for [place] and Pulmonary Rehab.

## 2016-12-21 NOTE — Telephone Encounter (Signed)
New message      Pt was recently seen. Dr Rayann Heman gave him 3 options at his last visit.  He did not understand his options.  Please call to discuss them

## 2016-12-21 NOTE — Telephone Encounter (Signed)
Spoke with patient and answered his questions.  He will have his follow up as scheduled on 10/22

## 2016-12-23 ENCOUNTER — Encounter (HOSPITAL_COMMUNITY)
Admission: RE | Admit: 2016-12-23 | Discharge: 2016-12-23 | Disposition: A | Discharge status: Home / Self Care | Payer: Medicare Other | Source: Ambulatory Visit | Attending: Cardiology | Admitting: Cardiology

## 2016-12-23 DIAGNOSIS — Z954 Presence of other heart-valve replacement: Secondary | ICD-10-CM

## 2016-12-23 DIAGNOSIS — Z952 Presence of prosthetic heart valve: Secondary | ICD-10-CM | POA: Diagnosis not present

## 2016-12-23 NOTE — Progress Notes (Signed)
Daily Session Note  Patient Details  Name: Charles [name] MRN: [id number] Date of Birth: 05/19/1945 Referring Provider:     CARDIAC REHAB PHASE II ORIENTATION from 12/17/2016 in [place]  Referring Provider  DR. McDowell      Encounter Date: 12/23/2016  Check In:     Session Check In - 12/23/16 1103      Check-In   Location [facility]   Staff Present Aundra Dubin, RN, BSN;Christino Mcglinchey Luther Parody, BS, EP, Exercise Physiologist   Supervising physician immediately available to respond to emergencies See telemetry face sheet for immediately available MD   Medication changes reported     No   Fall or balance concerns reported    No   Warm-up and Cool-down Performed as group-led instruction   Resistance Training Performed Yes   VAD Patient? No     Pain Assessment   Currently in Pain? No/denies   Pain Score 0-No pain   Multiple Pain Sites No      Capillary Blood Glucose: No results found for this or any previous visit (from the past 24 hour(s)).    History  Smoking Status  . Former Smoker  . Types: Cigarettes  . Quit date: 05/01/1991  Smokeless Tobacco  . Never Used    Comment: Quit 30 years ago.      Goals Met:  Independence with exercise equipment Exercise tolerated well No report of cardiac concerns or symptoms Strength training completed today  Goals Unmet:  Not Applicable  Comments: Check out 1200   Dr. Kate Sable is Medical Director for [place] and Pulmonary Rehab.

## 2016-12-24 NOTE — Progress Notes (Signed)
Patient was given his take home exercise plan. He expressed an understanding of the plan. We addressed his THR and safe ways to stay active outside of CR. He stated that he would soon do mild exercises at home. He was encouraged to come to me if he had any further questions.

## 2016-12-25 ENCOUNTER — Encounter (HOSPITAL_COMMUNITY)
Admission: RE | Admit: 2016-12-25 | Discharge: 2016-12-25 | Disposition: A | Discharge status: Home / Self Care | Payer: Medicare Other | Source: Ambulatory Visit | Attending: Cardiology | Admitting: Cardiology

## 2016-12-28 ENCOUNTER — Encounter (HOSPITAL_COMMUNITY)
Admission: RE | Admit: 2016-12-28 | Discharge: 2016-12-28 | Disposition: A | Discharge status: Home / Self Care | Payer: Medicare Other | Source: Ambulatory Visit | Attending: Cardiology | Admitting: Cardiology

## 2016-12-28 ENCOUNTER — Telehealth: Payer: Self-pay | Admitting: Internal Medicine

## 2016-12-28 DIAGNOSIS — Z952 Presence of prosthetic heart valve: Secondary | ICD-10-CM | POA: Diagnosis not present

## 2016-12-28 DIAGNOSIS — Z954 Presence of other heart-valve replacement: Secondary | ICD-10-CM

## 2016-12-28 DIAGNOSIS — Z951 Presence of aortocoronary bypass graft: Secondary | ICD-10-CM

## 2016-12-28 NOTE — Progress Notes (Signed)
Daily Session Note  Patient Details  Name: Charles [name] MRN: [id number] Date of Birth: October 16, 1945 Referring Provider:     CARDIAC REHAB PHASE II ORIENTATION from 12/17/2016 in [place]  Referring Provider  DR. McDowell      Encounter Date: 12/28/2016  Check In:     Session Check In - 12/28/16 1105      Check-In   Location [facility]   Staff Present Aundra Dubin, RN, BSN;Chidi Shirer Luther Parody, BS, EP, Exercise Physiologist   Supervising physician immediately available to respond to emergencies See telemetry face sheet for immediately available MD   Medication changes reported     No   Fall or balance concerns reported    No   Warm-up and Cool-down Performed as group-led instruction   Resistance Training Performed Yes   VAD Patient? No     Pain Assessment   Currently in Pain? No/denies   Pain Score 0-No pain   Multiple Pain Sites No      Capillary Blood Glucose: No results found for this or any previous visit (from the past 24 hour(s)).    History  Smoking Status  . Former Smoker  . Types: Cigarettes  . Quit date: 05/01/1991  Smokeless Tobacco  . Never Used    Comment: Quit 30 years ago.      Goals Met:  Independence with exercise equipment Exercise tolerated well No report of cardiac concerns or symptoms Strength training completed today  Goals Unmet:  Not Applicable  Comments: Check out 1200   Dr. Kate Sable is Medical Director for [place] and Pulmonary Rehab.

## 2016-12-28 NOTE — Telephone Encounter (Signed)
Left Patient VM [employer] paperwork ready For pick up.

## 2016-12-30 ENCOUNTER — Encounter (HOSPITAL_COMMUNITY)
Admission: RE | Admit: 2016-12-30 | Discharge: 2016-12-30 | Disposition: A | Discharge status: Home / Self Care | Payer: Medicare Other | Source: Ambulatory Visit | Attending: Cardiology | Admitting: Cardiology

## 2016-12-30 DIAGNOSIS — Z952 Presence of prosthetic heart valve: Secondary | ICD-10-CM | POA: Diagnosis not present

## 2016-12-30 DIAGNOSIS — Z954 Presence of other heart-valve replacement: Secondary | ICD-10-CM

## 2016-12-30 NOTE — Progress Notes (Signed)
Daily Session Note  Patient Details  Name: Charles [name] MRN: [id number] Date of Birth: June 07, 1945 Referring Provider:     CARDIAC REHAB PHASE II ORIENTATION from 12/17/2016 in [place]  Referring Provider  DR. Domenic Polite      Encounter Date: 12/30/2016  Check In:     Session Check In - 12/30/16 1114      Check-In   Location [facility]   Staff Present Aundra Dubin, RN, BSN;Ellaree Gear Luther Parody, BS, EP, Exercise Physiologist   Supervising physician immediately available to respond to emergencies See telemetry face sheet for immediately available MD   Medication changes reported     No   Fall or balance concerns reported    No   Warm-up and Cool-down Performed as group-led instruction   Resistance Training Performed Yes   VAD Patient? No     Pain Assessment   Currently in Pain? No/denies   Pain Score 0-No pain   Multiple Pain Sites No      Capillary Blood Glucose: No results found for this or any previous visit (from the past 24 hour(s)).    History  Smoking Status  . Former Smoker  . Types: Cigarettes  . Quit date: 05/01/1991  Smokeless Tobacco  . Never Used    Comment: Quit 30 years ago.      Goals Met:  Independence with exercise equipment Exercise tolerated well No report of cardiac concerns or symptoms Strength training completed today  Goals Unmet:  Not Applicable  Comments: Check out 1200   Dr. Kate Sable is Medical Director for [place] and [facility] Re[facility].

## 2017-01-01 ENCOUNTER — Encounter (HOSPITAL_COMMUNITY)
Admission: RE | Admit: 2017-01-01 | Discharge: 2017-01-01 | Disposition: A | Discharge status: Home / Self Care | Payer: Medicare Other | Source: Ambulatory Visit | Attending: Cardiology | Admitting: Cardiology

## 2017-01-01 DIAGNOSIS — Z952 Presence of prosthetic heart valve: Secondary | ICD-10-CM | POA: Diagnosis not present

## 2017-01-01 NOTE — Progress Notes (Signed)
Daily Session Note  Patient Details  Name: Charles [name] MRN: [id number] Date of Birth: 1945/09/18 Referring Provider:     CARDIAC REHAB PHASE II ORIENTATION from 12/17/2016 in [place]  Referring Provider  DR. Domenic Polite      Encounter Date: 01/01/2017  Check In:     Session Check In - 01/01/17 1100      Check-In   Location [facility]   Staff Present Kerria Sapien Angelina Pih, MS, EP, [facility], Exercise Physiologist;Debra Wynetta Emery, RN, BSN   Supervising physician immediately available to respond to emergencies See telemetry face sheet for immediately available MD   Medication changes reported     No   Fall or balance concerns reported    No   Tobacco Cessation No Change   Warm-up and Cool-down Performed as group-led instruction   Resistance Training Performed Yes   VAD Patient? No     Pain Assessment   Currently in Pain? No/denies   Pain Score 0-No pain   Multiple Pain Sites No      Capillary Blood Glucose: No results found for this or any previous visit (from the past 24 hour(s)).    History  Smoking Status  . Former Smoker  . Types: Cigarettes  . Quit date: 05/01/1991  Smokeless Tobacco  . Never Used    Comment: Quit 30 years ago.      Goals Met:  Independence with exercise equipment Exercise tolerated well No report of cardiac concerns or symptoms Strength training completed today  Goals Unmet:  Not Applicable  Comments: Check out: 12:00   Dr. Kate Sable is Medical Director for [place] and Pulmonary Rehab.

## 2017-01-04 ENCOUNTER — Encounter (HOSPITAL_COMMUNITY)
Admission: RE | Admit: 2017-01-04 | Discharge: 2017-01-04 | Disposition: A | Discharge status: Home / Self Care | Payer: Medicare Other | Source: Ambulatory Visit | Attending: Cardiology | Admitting: Cardiology

## 2017-01-04 DIAGNOSIS — Z954 Presence of other heart-valve replacement: Secondary | ICD-10-CM

## 2017-01-04 DIAGNOSIS — Z952 Presence of prosthetic heart valve: Secondary | ICD-10-CM | POA: Diagnosis not present

## 2017-01-04 NOTE — Progress Notes (Signed)
Daily Session Note  Patient Details  Name: Charles [name] MRN: [id number] Date of Birth: 14-Sep-1945 Referring Provider:     CARDIAC REHAB PHASE II ORIENTATION from 12/17/2016 in [place]  Referring Provider  DR. McDowell      Encounter Date: 01/04/2017  Check In:     Session Check In - 01/04/17 1056      Check-In   Location [facility]   Staff Present Aundra Dubin, RN, BSN;Tuck Dulworth Luther Parody, BS, EP, Exercise Physiologist   Supervising physician immediately available to respond to emergencies See telemetry face sheet for immediately available MD   Medication changes reported     No   Fall or balance concerns reported    No   Warm-up and Cool-down Performed as group-led instruction   Resistance Training Performed Yes   VAD Patient? No     Pain Assessment   Currently in Pain? No/denies   Pain Score 0-No pain   Multiple Pain Sites No      Capillary Blood Glucose: No results found for this or any previous visit (from the past 24 hour(s)).    History  Smoking Status  . Former Smoker  . Types: Cigarettes  . Quit date: 05/01/1991  Smokeless Tobacco  . Never Used    Comment: Quit 30 years ago.      Goals Met:  Independence with exercise equipment Exercise tolerated well No report of cardiac concerns or symptoms Strength training completed today  Goals Unmet:  Not Applicable  Comments: Check out 1200   Dr. Kate Sable is Medical Director for [place] and Pulmonary Rehab.

## 2017-01-06 ENCOUNTER — Encounter (HOSPITAL_COMMUNITY)
Admission: RE | Admit: 2017-01-06 | Discharge: 2017-01-06 | Disposition: A | Discharge status: Home / Self Care | Payer: Medicare Other | Source: Ambulatory Visit | Attending: Cardiology | Admitting: Cardiology

## 2017-01-06 DIAGNOSIS — Z952 Presence of prosthetic heart valve: Secondary | ICD-10-CM | POA: Diagnosis not present

## 2017-01-06 DIAGNOSIS — Z954 Presence of other heart-valve replacement: Secondary | ICD-10-CM

## 2017-01-06 NOTE — Progress Notes (Signed)
Daily Session Note  Patient Details  Name: TLALOC TADDEI MRN: [id number] Date of Birth: 1946-03-07 Referring Provider:     CARDIAC REHAB PHASE II ORIENTATION from 12/17/2016 in [place]  Referring Provider  DR. Domenic Polite      Encounter Date: 01/06/2017  Check In:     Session Check In - 01/06/17 1111      Check-In   Location [facility]   Staff Present Aundra Dubin, RN, BSN;Darline Faith Luther Parody, BS, EP, Exercise Physiologist   Supervising physician immediately available to respond to emergencies See telemetry face sheet for immediately available MD   Medication changes reported     No   Fall or balance concerns reported    No   Warm-up and Cool-down Performed as group-led instruction   Resistance Training Performed Yes   VAD Patient? No     Pain Assessment   Currently in Pain? No/denies   Pain Score 0-No pain   Multiple Pain Sites No      Capillary Blood Glucose: No results found for this or any previous visit (from the past 24 hour(s)).    History  Smoking Status  . Former Smoker  . Types: Cigarettes  . Quit date: 05/01/1991  Smokeless Tobacco  . Never Used    Comment: Quit 30 years ago.      Goals Met:  Independence with exercise equipment Exercise tolerated well  Goals Unmet:  Not Applicable  Comments: Check out 1200   Dr. Kate Sable is Medical Director for [place] and Pulmonary Re[facility].

## 2017-01-07 DIAGNOSIS — E782 Mixed hyperlipidemia: Secondary | ICD-10-CM | POA: Diagnosis not present

## 2017-01-08 ENCOUNTER — Encounter (HOSPITAL_COMMUNITY)
Admission: RE | Admit: 2017-01-08 | Discharge: 2017-01-08 | Disposition: A | Discharge status: Home / Self Care | Payer: Medicare Other | Source: Ambulatory Visit | Attending: Cardiology | Admitting: Cardiology

## 2017-01-08 DIAGNOSIS — Z952 Presence of prosthetic heart valve: Secondary | ICD-10-CM | POA: Diagnosis not present

## 2017-01-08 DIAGNOSIS — Z954 Presence of other heart-valve replacement: Secondary | ICD-10-CM

## 2017-01-08 NOTE — Progress Notes (Signed)
Daily Session Note  Patient Details  Name: Charles [name] MRN: [id number] Date of Birth: 16-Jul-1945 Referring Provider:     CARDIAC REHAB PHASE II ORIENTATION from 12/17/2016 in [place]  Referring Provider  DR. McDowell      Encounter Date: 01/08/2017  Check In:     Session Check In - 01/08/17 1044      Check-In   Location [facility]   Staff Present Aundra Dubin, RN, BSN;Ahad Colarusso Luther Parody, BS, EP, Exercise Physiologist   Supervising physician immediately available to respond to emergencies See telemetry face sheet for immediately available MD   Medication changes reported     No   Fall or balance concerns reported    No   Warm-up and Cool-down Performed as group-led instruction   Resistance Training Performed Yes   VAD Patient? No     Pain Assessment   Currently in Pain? No/denies   Pain Score 0-No pain   Multiple Pain Sites No      Capillary Blood Glucose: No results found for this or any previous visit (from the past 24 hour(s)).    History  Smoking Status  . Former Smoker  . Types: Cigarettes  . Quit date: 05/01/1991  Smokeless Tobacco  . Never Used    Comment: Quit 30 years ago.      Goals Met:  Independence with exercise equipment Exercise tolerated well No report of cardiac concerns or symptoms Strength training completed today  Goals Unmet:  Not Applicable  Comments: Check out 1200   Dr. Kate Sable is Medical Director for [place] and [facility]monary Re[facility].

## 2017-01-11 ENCOUNTER — Encounter (HOSPITAL_COMMUNITY)
Admission: RE | Admit: 2017-01-11 | Discharge: 2017-01-11 | Disposition: A | Discharge status: Home / Self Care | Payer: Medicare Other | Source: Ambulatory Visit | Attending: Cardiology | Admitting: Cardiology

## 2017-01-11 DIAGNOSIS — Z952 Presence of prosthetic heart valve: Secondary | ICD-10-CM | POA: Diagnosis present

## 2017-01-11 DIAGNOSIS — Z87891 Personal history of nicotine dependence: Secondary | ICD-10-CM | POA: Insufficient documentation

## 2017-01-11 DIAGNOSIS — Z954 Presence of other heart-valve replacement: Secondary | ICD-10-CM

## 2017-01-11 NOTE — Progress Notes (Signed)
Daily Session Note  Patient Details  Name: GIANNI MIHALIK MRN: [id number] Date of Birth: 04-09-1946 Referring Provider:     CARDIAC REHAB PHASE II ORIENTATION from 12/17/2016 in [place]  Referring Provider  DR. Domenic Polite      Encounter Date: 01/11/2017  Check In:     Session Check In - 01/11/17 1106      Check-In   Location [facility]   Staff Present Aundra Dubin, RN, BSN;Mikea Quadros Luther Parody, BS, EP, Exercise Physiologist   Supervising physician immediately available to respond to emergencies See telemetry face sheet for immediately available MD   Medication changes reported     No   Fall or balance concerns reported    No   Warm-up and Cool-down Performed as group-led instruction   Resistance Training Performed Yes   VAD Patient? No     Pain Assessment   Currently in Pain? No/denies   Pain Score 0-No pain   Multiple Pain Sites No      Capillary Blood Glucose: No results found for this or any previous visit (from the past 24 hour(s)).    History  Smoking Status  . Former Smoker  . Types: Cigarettes  . Quit date: 05/01/1991  Smokeless Tobacco  . Never Used    Comment: Quit 30 years ago.      Goals Met:  Independence with exercise equipment Exercise tolerated well No report of cardiac concerns or symptoms Strength training completed today  Goals Unmet:  Not Applicable  Comments: Check out 1200   Dr. Kate Sable is Medical Director for [place] and Pul[facility] Re[facility].

## 2017-01-13 ENCOUNTER — Encounter (HOSPITAL_COMMUNITY)
Admission: RE | Admit: 2017-01-13 | Discharge: 2017-01-13 | Disposition: A | Discharge status: Home / Self Care | Payer: Medicare Other | Source: Ambulatory Visit | Attending: Cardiology | Admitting: Cardiology

## 2017-01-13 DIAGNOSIS — Z952 Presence of prosthetic heart valve: Secondary | ICD-10-CM | POA: Diagnosis not present

## 2017-01-13 DIAGNOSIS — Z954 Presence of other heart-valve replacement: Secondary | ICD-10-CM

## 2017-01-13 DIAGNOSIS — Z951 Presence of aortocoronary bypass graft: Secondary | ICD-10-CM

## 2017-01-13 NOTE — Progress Notes (Signed)
Daily Session Note  Patient Details  Name: Charles [name] MRN: [id number] Date of Birth: 02-Jun-1945 Referring Provider:     CARDIAC REHAB PHASE II ORIENTATION from 12/17/2016 in [place]  Referring Provider  DR. Domenic Polite      Encounter Date: 01/13/2017  Check In:     Session Check In - 01/13/17 1106      Check-In   Location [facility]   Staff Present Aundra Dubin, RN, BSN;Mayan Kloepfer Luther Parody, BS, EP, Exercise Physiologist   Supervising physician immediately available to respond to emergencies See telemetry face sheet for immediately available MD   Medication changes reported     No   Fall or balance concerns reported    No   Warm-up and Cool-down Performed as group-led instruction   Resistance Training Performed Yes   VAD Patient? No     Pain Assessment   Pain Score 0-No pain   Multiple Pain Sites No      Capillary Blood Glucose: No results found for this or any previous visit (from the past 24 hour(s)).    History  Smoking Status  . Former Smoker  . Types: Cigarettes  . Quit date: 05/01/1991  Smokeless Tobacco  . Never Used    Comment: Quit 30 years ago.      Goals Met:  Independence with exercise equipment Exercise tolerated well No report of cardiac concerns or symptoms Strength training completed today  Goals Unmet:  Not Applicable  Comments: Check out 1200   Dr. Kate Sable is Medical Director for [place] and [facility] Re[facility].

## 2017-01-13 NOTE — Progress Notes (Signed)
Cardiac Individual Treatment Plan  Patient Details  Name: Charles [name] MRN: [id number] Date of Birth: 01-13-1946 Referring Provider:     CARDIAC REHAB PHASE II ORIENTATION from 12/17/2016 in [place]  Referring Provider  DR. McDowell      Initial Encounter Date:    CARDIAC REHAB PHASE II ORIENTATION from 12/17/2016 in [place]  Date  12/17/16  Referring Provider  DR. McDowell      Visit Diagnosis: S/P aortic valve replacement with allograft  S/P CABG x 1  Patient's Home Medications on Admission:  Current Outpatient Prescriptions:  .  aspirin EC 325 MG EC tablet, Take 1 tablet (325 mg total) by mouth daily. (Patient taking differently: Take 81 mg by mouth daily. ), Disp: 30 tablet, Rfl: 0 .  atorvastatin (LIPITOR) 20 MG tablet, Take 20 mg by mouth daily., Disp: , Rfl:  .  ferrous sulfate 325 (65 FE) MG tablet, Take 1 tablet (325 mg total) by mouth daily. (Patient not taking: Reported on 12/17/2016), Disp: , Rfl:  .  furosemide (LASIX) 40 MG tablet, Take 1 tablet (40 mg total) by mouth daily., Disp: 30 tablet, Rfl: 2 .  metoprolol tartrate (LOPRESSOR) 25 MG tablet, Take 1 tablet (25 mg total) by mouth 2 (two) times daily. Hold for HR less than 50, Disp: , Rfl:  .  Multiple Vitamin (MULTIVITAMIN WITH MINERALS) TABS tablet, Take 1 tablet by mouth daily., Disp: , Rfl:  .  potassium chloride SA (K-DUR,KLOR-CON) 20 MEQ tablet, Take 1 tablet (20 mEq total) by mouth daily., Disp: , Rfl:   Past Medical History: Past Medical History:  Diagnosis Date  . Anemia   . Aortic insufficiency    a. 07/07/2016: TEE showing bicuspid aortic valve with severe eccentric AI   . Ascending aortic aneurysm (HCC)    4.4 cm by CT 06/2016  . 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 Bicuspid aortic valve   . Chronic periodontitis   . Coronary artery disease   . Pneumonia 1978  . 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 S/P patent foramen ovale closure 10/02/2016    Tobacco Use: History  Smoking Status  . Former Smoker  . Types: Cigarettes  . Quit date: 05/01/1991  Smokeless Tobacco  . Never Used    Comment: Quit 30 years ago.      Labs: Recent Review Flowsheet Data    Labs for ITP Cardiac and Pulmonary Rehab Latest Ref Rng & Units 10/06/2016 10/07/2016 10/07/2016 10/07/2016 10/08/2016   Cholestrol 0 - 200 mg/dL - - - - -   LDLCALC 0 - 99 mg/dL - - - - -   HDL >40 mg/dL - - - - -   Trlycerides <150 mg/dL - - - - -   Hemoglobin A1c 4.8 - 5.6 % - - - - -   PHART 7.350 - 7.450 7.484(H) 7.499(H) - - -   PCO2ART 32.0 - 48.0 mmHg 41.6 44.4 - - -   HCO3 20.0 - 28.0 mmol/L 31.4(H) 34.6(H) - - -   TCO2 0 - 100 mmol/L 33 36 - - -   ACIDBASEDEF 0.0 - 2.0 mmol/L - - - - -  O2SAT % 92.0 98.0 65.1 95.4 65.4      Capillary Blood Glucose: Lab Results  Component Value Date   GLUCAP 147 (H) 10/05/2016   GLUCAP 97 10/05/2016   GLUCAP 110 (H) 10/05/2016   GLUCAP 90 10/05/2016   GLUCAP 98 10/05/2016     Exercise Target Goals:    Exercise Program Goal: Individual exercise prescription set with THRR, safety & activity barriers. Participant demonstrates ability to understand and report RPE using BORG scale, to self-measure pulse accurately, and to acknowledge the importance of the exercise prescription.  Exercise Prescription Goal: Starting with aerobic activity 30 plus minutes a day, 3 days per week for initial exercise prescription. Provide home exercise prescription and guidelines that participant acknowledges understanding prior to discharge.  Activity Barriers & Risk Stratification:     Activity Barriers & Cardiac Risk  Stratification - 12/17/16 0957      Activity Barriers & Cardiac Risk Stratification   Activity Barriers Deconditioning;Incisional Pain   Cardiac Risk Stratification High      6 Minute Walk:     6 Minute Walk    Row Name 12/17/16 0956         6 Minute Walk   Phase Initial     Distance 1200 feet     Distance % Change 0 %     Distance Feet Change 0 ft     Walk Time 6 minutes     # of Rest Breaks 0     MPH 2.27     METS 2.74     RPE 9     Perceived Dyspnea  7     VO2 Peak 8.77     Symptoms No     Resting HR 61 bpm     Resting BP 100/56     Resting Oxygen Saturation  95 %     Exercise Oxygen Saturation  during 6 min walk 91 %     Max Ex. HR 96 bpm     Max Ex. BP 124/70     2 Minute Post BP 100/58        Oxygen Initial Assessment:   Oxygen Re-Evaluation:   Oxygen Discharge (Final Oxygen Re-Evaluation):   Initial Exercise Prescription:     Initial Exercise Prescription - 12/17/16 0800      Date of Initial Exercise RX and Referring Provider   Date 12/17/16   Referring Provider DR. McDowell     Treadmill   MPH 1.5   Grade 0   Minutes 15   METs 2.2     Recumbant Elliptical   Level 1   RPM 40   Watts 41   Minutes 20   METs 2.4     Prescription Details   Frequency (times per week) 3   Duration Progress to 30 minutes of continuous aerobic without signs/symptoms of physical distress     Intensity   THRR 40-80% of Max Heartrate [phone]   Ratings of Perceived Exertion 11-13   Perceived Dyspnea 0-4     Progression   Progression Continue progressive overload as per policy without signs/symptoms or physical distress.     Resistance Training   Training Prescription Yes   Weight 1   Reps 10-15      Perform Capillary Blood Glucose checks as needed.  Exercise Prescription Changes:      Exercise Prescription Changes    Row Name 12/24/16 1400 12/28/16 1400 01/12/17 0700         Response to Exercise  Blood Pressure (Admit)  - 136/80  124/70     Blood Pressure (Exercise)  - 144/82 144/70     Blood Pressure (Exit)  - 126/76 124/66     Heart Rate (Admit)  - 68 bpm 63 bpm     Heart Rate (Exercise)  - 70 bpm 74 bpm     Heart Rate (Exit)  - 70 bpm 72 bpm     Rating of Perceived Exertion (Exercise)  - 10 11     Duration  - Progress to 30 minutes of  aerobic without signs/symptoms of physical distress Progress to 30 minutes of  aerobic without signs/symptoms of physical distress     Intensity  - THRR New  [phone] THRR New  [phone]       Progression   Progression  - Continue to progress workloads to maintain intensity without signs/symptoms of physical distress. Continue to progress workloads to maintain intensity without signs/symptoms of physical distress.       Resistance Training   Training Prescription Yes Yes Yes     Weight 1 1 2      Reps 10-15 10-15 10-15       Treadmill   MPH 1.5 1.7 2     Grade 0 0 0     Minutes 15 20 20      METs 2.2 2.3 2.5       NuStep   Level  - 2 3     SPM  - 77 89     Minutes  - 15 15     METs  - 1.9 2.4       Recumbant Elliptical   Level 1 -  -     RPM 40 -  -     Watts 41 -  -     Minutes 20 -  -     METs 2.4 -  -       Home Exercise Plan   Plans to continue exercise at Home (comment) Home (comment) Home (comment)     Frequency Add 2 additional days to program exercise sessions. Add 2 additional days to program exercise sessions. Add 2 additional days to program exercise sessions.     Initial Home Exercises Provided 12/21/16 12/21/16 12/21/16        Exercise Comments:      Exercise Comments    Row Name 12/24/16 1433 12/28/16 1444 01/12/17 0750       Exercise Comments Patient was given his take home exercise plan. He expressed an understanding of the plan. We addressed his THR and safe ways to stay active outside of CR. He stated that he would soon do mild exercises at home. He was encouraged to come to me if he had any further questions.  Patient is doing well  in the program. He was taken off the recumbent elliptical due to back pain.  Patient is doing well in CR. Patient stated that he currently walks for thirty minutes at a time when he is not at CR.         Exercise Goals and Review:      Exercise Goals    Row Name 12/17/16 0958             Exercise Goals   Increase Physical Activity Yes       Intervention Provide advice, education, support and counseling about physical activity/exercise needs.;Develop an individualized exercise prescription for aerobic and resistive training based on initial evaluation findings, risk stratification, comorbidities and participant's  personal goals.       Expected Outcomes Achievement of increased cardiorespiratory fitness and enhanced flexibility, muscular endurance and strength shown through measurements of functional capacity and personal statement of participant.       Increase Strength and Stamina Yes       Intervention Provide advice, education, support and counseling about physical activity/exercise needs.;Develop an individualized exercise prescription for aerobic and resistive training based on initial evaluation findings, risk stratification, comorbidities and participant's personal goals.       Expected Outcomes Achievement of increased cardiorespiratory fitness and enhanced flexibility, muscular endurance and strength shown through measurements of functional capacity and personal statement of participant.       Able to understand and use rate of perceived exertion (RPE) scale Yes       Intervention Provide education and explanation on how to use RPE scale       Expected Outcomes Short Term: Able to use RPE daily in rehab to express subjective intensity level;Long Term:  Able to use RPE to guide intensity level when exercising independently       Able to understand and use Dyspnea scale Yes       Intervention Provide education and explanation on how to use Dyspnea scale       Expected Outcomes Short  Term: Able to use Dyspnea scale daily in rehab to express subjective sense of shortness of breath during exertion;Long Term: Able to use Dyspnea scale to guide intensity level when exercising independently       Knowledge and understanding of Target Heart Rate Range (THRR) Yes       Intervention Provide education and explanation of THRR including how the numbers were predicted and where they are located for reference       Expected Outcomes Short Term: Able to state/look up THRR       Able to check pulse independently Yes       Intervention Provide education and demonstration on how to check pulse in carotid and radial arteries.;Review the importance of being able to check your own pulse for safety during independent exercise       Expected Outcomes Short Term: Able to explain why pulse checking is important during independent exercise;Long Term: Able to check pulse independently and accurately       Understanding of Exercise Prescription Yes       Intervention Provide education, explanation, and written materials on patient's individual exercise prescription       Expected Outcomes Short Term: Able to explain program exercise prescription;Long Term: Able to explain home exercise prescription to exercise independently          Exercise Goals Re-Evaluation :     Exercise Goals Re-Evaluation    Row Name 12/28/16 1444 01/12/17 0748           Exercise Goal Re-Evaluation   Exercise Goals Review Increase Physical Activity;Increase Strength and Stamina;Knowledge and understanding of Target Heart Rate Range (THRR) Increase Physical Activity;Increase Strength and Stamina;Knowledge and understanding of Target Heart Rate Range (THRR)      Comments Patient is doing well in CR.  Patient is doing well in CR. Patient stated that he currently walks for thirty minutes at a time when he is not at CR.       Expected Outcomes Patient is trying to get back into better shape and lose weight through program.   Patient wants to gain full mobility and to get back to driving  Discharge Exercise Prescription (Final Exercise Prescription Changes):     Exercise Prescription Changes - 01/12/17 0700      Response to Exercise   Blood Pressure (Admit) 124/70   Blood Pressure (Exercise) 144/70   Blood Pressure (Exit) 124/66   Heart Rate (Admit) 63 bpm   Heart Rate (Exercise) 74 bpm   Heart Rate (Exit) 72 bpm   Rating of Perceived Exertion (Exercise) 11   Duration Progress to 30 minutes of  aerobic without signs/symptoms of physical distress   Intensity THRR New  [phone]     Progression   Progression Continue to progress workloads to maintain intensity without signs/symptoms of physical distress.     Resistance Training   Training Prescription Yes   Weight 2   Reps 10-15     Treadmill   MPH 2   Grade 0   Minutes 20   METs 2.5     NuStep   Level 3   SPM 89   Minutes 15   METs 2.4     Home Exercise Plan   Plans to continue exercise at Home (comment)   Frequency Add 2 additional days to program exercise sessions.   Initial Home Exercises Provided 12/21/16      Nutrition:  Target Goals: Understanding of nutrition guidelines, daily intake of sodium 1500mg , cholesterol 200mg , calories 30% from fat and 7% or less from saturated fats, daily to have 5 or more servings of fruits and vegetables.  Biometrics:     Pre Biometrics - 12/17/16 0841      Pre Biometrics   Height 6' (1.829 m)   Weight 231 lb 14.8 oz (105.2 kg)   Waist Circumference 44 inches   Hip Circumference 46.5 inches   Waist to Hip Ratio 0.95 %   BMI (Calculated) 31.45   Triceps Skinfold 16 mm   % Body Fat 30.7 %   Grip Strength 83.2 kg   Flexibility 14 in   Single Leg Stand 9 seconds       Nutrition Therapy Plan and Nutrition Goals:   Nutrition Discharge: Rate Your Plate Scores:   Nutrition Goals Re-Evaluation:   Nutrition Goals Discharge (Final Nutrition Goals  Re-Evaluation):   Psychosocial: Target Goals: Acknowledge presence or absence of significant depression and/or stress, maximize coping skills, provide positive support system. Participant is able to verbalize types and ability to use techniques and skills needed for reducing stress and depression.  Initial Review & Psychosocial Screening:     Initial Psych Review & Screening - 12/17/16 1134      Initial Review   Current issues with None Identified     Family Dynamics   Good Support System? Yes     Barriers   Psychosocial barriers to participate in program There are no identifiable barriers or psychosocial needs.     Screening Interventions   Interventions Encouraged to exercise      Quality of Life Scores:     Quality of Life - 12/17/16 0842      Quality of Life Scores   Health/Function Pre 17.77 %   Socioeconomic Pre 16.36 %   Psych/Spiritual Pre 20.79 %   Family Pre 22.2 %   GLOBAL Pre 18.75 %      PHQ-9: Recent Review Flowsheet Data    Depression screen [facility] 2/9 12/17/2016 09/08/2016 07/28/2016   Decreased Interest - 0 0   Down, Depressed, Hopeless - 0 0   PHQ - 2 Score - 0 0   Altered sleeping 0 - -  Tired, decreased energy 0 - -   Change in appetite 0 - -   Feeling bad or failure about yourself  1 - -   Trouble concentrating 0 - -   Moving slowly or fidgety/restless 0 - -   Suicidal thoughts 0 - -   Difficult doing work/chores Somewhat difficult - -     Interpretation of Total Score  Total Score Depression Severity:  1-4 = Minimal depression, 5-9 = Mild depression, 10-14 = Moderate depression, 15-19 = Moderately severe depression, 20-27 = Severe depression   Psychosocial Evaluation and Intervention:     Psychosocial Evaluation - 12/17/16 1146      Psychosocial Evaluation & Interventions   Interventions Encouraged to exercise with the program and follow exercise prescription   Continue Psychosocial Services  No Follow up required      Psychosocial  Re-Evaluation:     Psychosocial Re-Evaluation    [place] Name 01/13/17 1301             Psychosocial Re-Evaluation   Current issues with None Identified       Expected Outcomes Patient will have no psychosocial issues identified at discharge.        Interventions Encouraged to attend Cardiac Rehabilitation for the exercise       Continue Psychosocial Services  No Follow up required          Psychosocial Discharge (Final Psychosocial Re-Evaluation):     Psychosocial Re-Evaluation - 01/13/17 1301      Psychosocial Re-Evaluation   Current issues with None Identified   Expected Outcomes Patient will have no psychosocial issues identified at discharge.    Interventions Encouraged to attend Cardiac Rehabilitation for the exercise   Continue Psychosocial Services  No Follow up required      Vocational Rehabilitation: Provide vocational rehab assistance to qualifying candidates.   Vocational Rehab Evaluation & Intervention:     Vocational Rehab - 12/17/16 1130      Initial Vocational Rehab Evaluation & Intervention   Assessment shows need for Vocational Rehabilitation No      Education: Education Goals: Education classes will be provided on a weekly basis, covering required topics. Participant will state understanding/return demonstration of topics presented.  Learning Barriers/Preferences:     Learning Barriers/Preferences - 12/17/16 1128      Learning Barriers/Preferences   Learning Barriers None   Learning Preferences Individual Instruction;Group Instruction;Skilled Demonstration      Education Topics: Hypertension, Hypertension Reduction -Define heart disease and high blood pressure. Discus how high blood pressure affects the body and ways to reduce high blood pressure.   Exercise and Your Heart -Discuss why it is important to exercise, the FITT principles of exercise, normal and abnormal responses to exercise, and how to exercise safely.   Angina -Discuss  definition of angina, causes of angina, treatment of angina, and how to decrease risk of having angina.   Cardiac Medications -Review what the following cardiac medications are used for, how they affect the body, and side effects that may occur when taking the medications.  Medications include Aspirin, Beta blockers, calcium channel blockers, ACE Inhibitors, angiotensin receptor blockers, diuretics, digoxin, and antihyperlipidemics.   Congestive Heart Failure -Discuss the definition of CHF, how to live with CHF, the signs and symptoms of CHF, and how keep track of weight and sodium intake.   Heart Disease and Intimacy -Discus the effect sexual activity has on the heart, how changes occur during intimacy as we age, and safety during sexual activity.  Smoking Cessation / COPD -Discuss different methods to quit smoking, the health benefits of quitting smoking, and the definition of COPD.   CARDIAC REHAB PHASE II EXERCISE from 01/13/2017 in [place]  Date  12/23/16  Educator  DJ  Instruction Review Code  2- Demonstrated Understanding      Nutrition I: Fats -Discuss the types of cholesterol, what cholesterol does to the heart, and how cholesterol levels can be controlled.   CARDIAC REHAB PHASE II EXERCISE from 01/13/2017 in [place]  Date  12/30/16  Educator  DC  Instruction Review Code  2- Demonstrated Understanding      Nutrition II: Labels -Discuss the different components of food labels and how to read food label   [place] from 01/13/2017 in [place]  Date  01/06/17  Educator  Dc  Instruction Review Code  2- Demonstrated Understanding      Heart Parts and Heart Disease -Discuss the anatomy of the heart, the pathway of blood circulation through the heart, and these are affected by heart disease.   CARDIAC REHAB PHASE II EXERCISE from 01/13/2017 in [place]   Date  01/13/17  Educator  DJ  Instruction Review Code  2- Demonstrated Understanding      Stress I: Signs and Symptoms -Discuss the causes of stress, how stress may lead to anxiety and depression, and ways to limit stress.   Stress II: Relaxation -Discuss different types of relaxation techniques to limit stress.   Warning Signs of Stroke / TIA -Discuss definition of a stroke, what the signs and symptoms are of a stroke, and how to identify when someone is having stroke.   Knowledge Questionnaire Score:     Knowledge Questionnaire Score - 12/17/16 1130      Knowledge Questionnaire Score   Pre Score 21/24      Core Components/Risk Factors/Patient Goals at Admission:     Personal Goals and Risk Factors at Admission - 12/17/16 1132      Core Components/Risk Factors/Patient Goals on Admission    Weight Management Weight Maintenance   Improve shortness of breath with ADL's Yes   Intervention Provide education, individualized exercise plan and daily activity instruction to help decrease symptoms of SOB with activities of daily living.   Expected Outcomes Short Term: Achieves a reduction of symptoms when performing activities of daily living.   Personal Goal Other Yes   Personal Goal Full mobility, get back to driving   Intervention Attend CR 3 x week and supplement with 2 days of home exercise.    Expected Outcomes Reach goals.       Core Components/Risk Factors/Patient Goals Review:      Goals and Risk Factor Review    Row Name 01/13/17 1256             Core Components/Risk Factors/Patient Goals Review   Personal Goals Review Weight Management/Obesity;Improve shortness of breath with ADL's  Full mobility and drive again.        Review Patient has completed 11 sessions gaining 5.4 lbs since he started the program. Patient was asked about his weight gain. He says he makes his own ice cream and he has been eating it every night. He attributes this to his gain. He  has not attended the nutrition class but plans to attend the next class. He says he weighs himself everyday and is to call MD for a weight gain over 3 lbs/day or 5  lbs/wk. He says he does feel stronger and feels like his mobility is improving but has not started driving yet. His MD will release him to drive in December. Will continue to monitor.        Expected Outcomes Patient will complete the program and continue to work toward meeting his personal goals.           Core Components/Risk Factors/Patient Goals at Discharge (Final Review):      Goals and Risk Factor Review - 01/13/17 1256      Core Components/Risk Factors/Patient Goals Review   Personal Goals Review Weight Management/Obesity;Improve shortness of breath with ADL's  Full mobility and drive again.    Review Patient has completed 11 sessions gaining 5.4 lbs since he started the program. Patient was asked about his weight gain. He says he makes his own ice cream and he has been eating it every night. He attributes this to his gain. He has not attended the nutrition class but plans to attend the next class. He says he weighs himself everyday and is to call MD for a weight gain over 3 lbs/day or 5 lbs/wk. He says he does feel stronger and feels like his mobility is improving but has not started driving yet. His MD will release him to drive in December. Will continue to monitor.    Expected Outcomes Patient will complete the program and continue to work toward meeting his personal goals.       ITP Comments:     ITP Comments    Row Name 12/17/16 1523           ITP Comments Patient new to program. Plans to start Monday 12/21/16.          Comments: ITP 30 Day REVIEW Patient is doing well in the program. Will continue to monitor for progress.

## 2017-01-15 ENCOUNTER — Encounter (HOSPITAL_COMMUNITY)
Admission: RE | Admit: 2017-01-15 | Discharge: 2017-01-15 | Disposition: A | Discharge status: Home / Self Care | Payer: Medicare Other | Source: Ambulatory Visit | Attending: Cardiology | Admitting: Cardiology

## 2017-01-15 DIAGNOSIS — Z951 Presence of aortocoronary bypass graft: Secondary | ICD-10-CM

## 2017-01-15 DIAGNOSIS — Z952 Presence of prosthetic heart valve: Secondary | ICD-10-CM | POA: Diagnosis not present

## 2017-01-15 DIAGNOSIS — Z954 Presence of other heart-valve replacement: Secondary | ICD-10-CM

## 2017-01-15 NOTE — Progress Notes (Signed)
Daily Session Note  Patient Details  Name: LEDELL CODRINGTON MRN: [id number] Date of Birth: Dec 29, 1945 Referring Provider:     CARDIAC REHAB PHASE II ORIENTATION from 12/17/2016 in [place]  Referring Provider  DR. Domenic Polite      Encounter Date: 01/15/2017  Check In:     Session Check In - 01/15/17 1104      Check-In   Location [facility]   Staff Present Suzanne Boron, BS, EP, Exercise Physiologist;Debra Wynetta Emery, RN, BSN   Supervising physician immediately available to respond to emergencies See telemetry face sheet for immediately available MD   Medication changes reported     No   Fall or balance concerns reported    No   Warm-up and Cool-down Performed as group-led instruction   Resistance Training Performed Yes   VAD Patient? No     Pain Assessment   Currently in Pain? No/denies   Pain Score 0-No pain   Multiple Pain Sites No      Capillary Blood Glucose: No results found for this or any previous visit (from the past 24 hour(s)).    History  Smoking Status  . Former Smoker  . Types: Cigarettes  . Quit date: 05/01/1991  Smokeless Tobacco  . Never Used    Comment: Quit 30 years ago.      Goals Met:  Independence with exercise equipment Exercise tolerated well No report of cardiac concerns or symptoms Strength training completed today  Goals Unmet:  Not Applicable  Comments: Check out 1200   Dr. Kate Sable is Medical Director for [place] and Pulmonary Rehab.

## 2017-01-16 ENCOUNTER — Other Ambulatory Visit: Payer: Self-pay | Admitting: Thoracic Surgery (Cardiothoracic Vascular Surgery)

## 2017-01-16 DIAGNOSIS — I339 Acute and subacute endocarditis, unspecified: Secondary | ICD-10-CM | POA: Diagnosis not present

## 2017-01-16 DIAGNOSIS — Z951 Presence of aortocoronary bypass graft: Secondary | ICD-10-CM | POA: Diagnosis not present

## 2017-01-16 DIAGNOSIS — Q231 Congenital insufficiency of aortic valve: Secondary | ICD-10-CM | POA: Diagnosis not present

## 2017-01-16 DIAGNOSIS — I472 Ventricular tachycardia: Secondary | ICD-10-CM | POA: Diagnosis not present

## 2017-01-18 ENCOUNTER — Encounter (HOSPITAL_COMMUNITY)
Admission: RE | Admit: 2017-01-18 | Discharge: 2017-01-18 | Disposition: A | Discharge status: Home / Self Care | Payer: Medicare Other | Source: Ambulatory Visit | Attending: Cardiology | Admitting: Cardiology

## 2017-01-18 DIAGNOSIS — Z954 Presence of other heart-valve replacement: Secondary | ICD-10-CM

## 2017-01-18 DIAGNOSIS — Z951 Presence of aortocoronary bypass graft: Secondary | ICD-10-CM

## 2017-01-18 DIAGNOSIS — Z952 Presence of prosthetic heart valve: Secondary | ICD-10-CM | POA: Diagnosis not present

## 2017-01-18 NOTE — Progress Notes (Signed)
Daily Session Note  Patient Details  Name: PAWAN KNECHTEL MRN: [id number] Date of Birth: [birth year] Referring Provider:     CARDIAC REHAB PHASE II ORIENTATION from 12/17/2016 in [place]  Referring Provider  DR. Domenic Polite      Encounter Date: 01/18/2017  Check In:     Session Check In - 01/18/17 1107      Check-In   Location [facility]   Staff Present Aundra Dubin, RN, BSN;Sherrica Niehaus Luther Parody, BS, EP, Exercise Physiologist   Supervising physician immediately available to respond to emergencies See telemetry face sheet for immediately available MD   Medication changes reported     No   Fall or balance concerns reported    No   Warm-up and Cool-down Performed as group-led instruction   Resistance Training Performed Yes   VAD Patient? No     Pain Assessment   Currently in Pain? No/denies   Pain Score 0-No pain   Multiple Pain Sites No      Capillary Blood Glucose: No results found for this or any previous visit (from the past 24 hour(s)).    History  Smoking Status  . Former Smoker  . Types: Cigarettes  . Quit date: 05/01/1991  Smokeless Tobacco  . Never Used    Comment: Quit 30 years ago.      Goals Met:  Independence with exercise equipment Exercise tolerated well No report of cardiac concerns or symptoms Strength training completed today  Goals Unmet:  Not Applicable  Comments: Check out 1200   Dr. Kate Sable is Medical Director for [place] and Pulmonary Rehab.

## 2017-01-20 ENCOUNTER — Encounter (HOSPITAL_COMMUNITY)
Admission: RE | Admit: 2017-01-20 | Discharge: 2017-01-20 | Disposition: A | Discharge status: Home / Self Care | Payer: Medicare Other | Source: Ambulatory Visit | Attending: Cardiology | Admitting: Cardiology

## 2017-01-20 DIAGNOSIS — I472 Ventricular tachycardia: Secondary | ICD-10-CM | POA: Diagnosis not present

## 2017-01-20 DIAGNOSIS — Z954 Presence of other heart-valve replacement: Secondary | ICD-10-CM

## 2017-01-20 DIAGNOSIS — Z952 Presence of prosthetic heart valve: Secondary | ICD-10-CM | POA: Diagnosis not present

## 2017-01-20 DIAGNOSIS — R944 Abnormal results of kidney function studies: Secondary | ICD-10-CM | POA: Diagnosis not present

## 2017-01-20 DIAGNOSIS — D649 Anemia, unspecified: Secondary | ICD-10-CM | POA: Diagnosis not present

## 2017-01-20 DIAGNOSIS — Z951 Presence of aortocoronary bypass graft: Secondary | ICD-10-CM

## 2017-01-20 DIAGNOSIS — E785 Hyperlipidemia, unspecified: Secondary | ICD-10-CM | POA: Diagnosis not present

## 2017-01-20 NOTE — Progress Notes (Signed)
Daily Session Note  Patient Details  Name: Charles [name] MRN: [id number] Date of Birth: [birth year] Referring Provider:     CARDIAC REHAB PHASE II ORIENTATION from 12/17/2016 in [place]  Referring Provider  DR. Domenic Polite      Encounter Date: 01/20/2017  Check In:     Session Check In - 01/20/17 1109      Check-In   Location [facility]   Staff Present Aundra Dubin, RN, BSN;Tyiesha Brackney Luther Parody, BS, EP, Exercise Physiologist   Supervising physician immediately available to respond to emergencies See telemetry face sheet for immediately available MD   Medication changes reported     No   Fall or balance concerns reported    No   Warm-up and Cool-down Performed as group-led instruction   Resistance Training Performed Yes   VAD Patient? No     Pain Assessment   Currently in Pain? No/denies   Pain Score 0-No pain   Multiple Pain Sites No      Capillary Blood Glucose: No results found for this or any previous visit (from the past 24 hour(s)).    History  Smoking Status  . Former Smoker  . Types: Cigarettes  . Quit date: 05/01/1991  Smokeless Tobacco  . Never Used    Comment: Quit 30 years ago.      Goals Met:  Independence with exercise equipment Exercise tolerated well No report of cardiac concerns or symptoms Strength training completed today  Goals Unmet:  Not Applicable  Comments: Check out 1200   Dr. Kate Sable is Medical Director for [place] and Pulmonary Rehab.

## 2017-01-21 ENCOUNTER — Other Ambulatory Visit: Payer: Self-pay | Admitting: Cardiology

## 2017-01-21 MED ORDER — ATORVASTATIN CALCIUM 20 MG PO TABS: 20.0000 mg | ORAL_TABLET | Freq: Every day | ORAL | 1 refills | Status: DC

## 2017-01-21 MED ORDER — METOPROLOL TARTRATE 25 MG PO TABS: 25.0000 mg | ORAL_TABLET | Freq: Two times a day (BID) | ORAL | 1 refills | Status: DC

## 2017-01-21 MED ORDER — POTASSIUM CHLORIDE CRYS ER 20 MEQ PO TBCR: 20.0000 meq | EXTENDED_RELEASE_TABLET | Freq: Every day | ORAL | 1 refills | Status: DC

## 2017-01-21 NOTE — Telephone Encounter (Signed)
°*  STAT* If patient is at the pharmacy, call can be transferred to refill team.   1. Which medications need to be refilled? (please list name of each medication and dose if known)   atorvastatin (LIPITOR) 20 MG tablet [[id number]]   metoprolol tartrate (LOPRESSOR) 25 MG tablet [[id number]]   potassium chloride SA (K-DUR,KLOR-CON) 20 MEQ tablet [[id number]]    2. Which pharmacy/location (including street and city if local pharmacy) is medication to be sent to? [facility] [place]   3. Do they need a 30 day or 90 day supply? 90 day  Scheduled to see Dr. Domenic Polite on 10/23, runs out of these meds today.  Also, needs to speak w/ someone about his furosemide (LASIX) 40 MG tablet [[id number]] ENDED  Being reduced to 20mg

## 2017-01-21 NOTE — Telephone Encounter (Signed)
Patient states Dr hall is going to reduce lasix to 20 mg , and they are calling it in to pharmacy

## 2017-01-21 NOTE — Telephone Encounter (Signed)
Attempt to reach, left message-cc

## 2017-01-22 ENCOUNTER — Encounter (HOSPITAL_COMMUNITY): Payer: Medicare Other

## 2017-01-25 ENCOUNTER — Encounter (HOSPITAL_COMMUNITY)
Admission: RE | Admit: 2017-01-25 | Discharge: 2017-01-25 | Disposition: A | Discharge status: Home / Self Care | Payer: Medicare Other | Source: Ambulatory Visit | Attending: Cardiology | Admitting: Cardiology

## 2017-01-25 DIAGNOSIS — Z952 Presence of prosthetic heart valve: Secondary | ICD-10-CM | POA: Diagnosis not present

## 2017-01-25 NOTE — Progress Notes (Signed)
Daily Session Note  Patient Details  Name: Charles [name] MRN: [id number] Date of Birth: 1946/01/09 Referring Provider:     CARDIAC REHAB PHASE II ORIENTATION from 12/17/2016 in [place]  Referring Provider  DR. Domenic Polite      Encounter Date: 01/25/2017  Check In:     Session Check In - 01/25/17 1100      Check-In   Location [facility]   Staff Present Boden Stucky Angelina Pih, MS, EP, [facility], Exercise Physiologist;Gregory Luther Parody, BS, EP, Exercise Physiologist   Supervising physician immediately available to respond to emergencies See telemetry face sheet for immediately available MD   Medication changes reported     No   Fall or balance concerns reported    No   Tobacco Cessation No Change   Warm-up and Cool-down Performed as group-led instruction   Resistance Training Performed Yes   VAD Patient? No     Pain Assessment   Currently in Pain? No/denies   Pain Score 0-No pain   Multiple Pain Sites No      Capillary Blood Glucose: No results found for this or any previous visit (from the past 24 hour(s)).    History  Smoking Status  . Former Smoker  . Types: Cigarettes  . Quit date: 05/01/1991  Smokeless Tobacco  . Never Used    Comment: Quit 30 years ago.      Goals Met:  Independence with exercise equipment Exercise tolerated well No report of cardiac concerns or symptoms Strength training completed today  Goals Unmet:  Not Applicable  Comments: Check out: 12:00   Dr. Kate Sable is Medical Director for [place] and Pulmonary Rehab.

## 2017-01-27 ENCOUNTER — Encounter (HOSPITAL_COMMUNITY)
Admission: RE | Admit: 2017-01-27 | Discharge: 2017-01-27 | Disposition: A | Discharge status: Home / Self Care | Payer: Medicare Other | Source: Ambulatory Visit | Attending: Cardiology | Admitting: Cardiology

## 2017-01-27 DIAGNOSIS — Z954 Presence of other heart-valve replacement: Secondary | ICD-10-CM

## 2017-01-27 DIAGNOSIS — Z952 Presence of prosthetic heart valve: Secondary | ICD-10-CM | POA: Diagnosis not present

## 2017-01-27 DIAGNOSIS — Z951 Presence of aortocoronary bypass graft: Secondary | ICD-10-CM

## 2017-01-27 NOTE — Progress Notes (Signed)
Daily Session Note  Patient Details  Name: Charles [name] MRN: [id number] Date of Birth: 11/10/1945 Referring Provider:     CARDIAC REHAB PHASE II ORIENTATION from 12/17/2016 in [place]  Referring Provider  DR. Domenic Polite      Encounter Date: 01/27/2017  Check In:     Session Check In - 01/27/17 1110      Check-In   Location [facility]   Staff Present Aundra Dubin, RN, BSN;Dashiel Bergquist Luther Parody, BS, EP, Exercise Physiologist   Supervising physician immediately available to respond to emergencies See telemetry face sheet for immediately available MD   Medication changes reported     No   Fall or balance concerns reported    No   Warm-up and Cool-down Performed as group-led instruction   Resistance Training Performed Yes   VAD Patient? No     Pain Assessment   Currently in Pain? No/denies   Pain Score 0-No pain   Multiple Pain Sites No      Capillary Blood Glucose: No results found for this or any previous visit (from the past 24 hour(s)).    History  Smoking Status  . Former Smoker  . Types: Cigarettes  . Quit date: 05/01/1991  Smokeless Tobacco  . Never Used    Comment: Quit 30 years ago.      Goals Met:  Independence with exercise equipment Exercise tolerated well No report of cardiac concerns or symptoms Strength training completed today  Goals Unmet:  Not Applicable  Comments: Check out 1200   Dr. Kate Sable is Medical Director for [place] and Pulmonary Rehab.

## 2017-01-29 ENCOUNTER — Encounter (HOSPITAL_COMMUNITY)
Admission: RE | Admit: 2017-01-29 | Discharge: 2017-01-29 | Disposition: A | Discharge status: Home / Self Care | Payer: Medicare Other | Source: Ambulatory Visit | Attending: Cardiology | Admitting: Cardiology

## 2017-01-29 DIAGNOSIS — Z954 Presence of other heart-valve replacement: Secondary | ICD-10-CM

## 2017-01-29 DIAGNOSIS — Z951 Presence of aortocoronary bypass graft: Secondary | ICD-10-CM

## 2017-01-29 DIAGNOSIS — Z952 Presence of prosthetic heart valve: Secondary | ICD-10-CM | POA: Diagnosis not present

## 2017-01-29 NOTE — Progress Notes (Signed)
Daily Session Note  Patient Details  Name: PACER DORN MRN: [id number] Date of Birth: [birth year] Referring Provider:     CARDIAC REHAB PHASE II ORIENTATION from 12/17/2016 in [place]  Referring Provider  DR. Domenic Polite      Encounter Date: 01/29/2017  Check In:     Session Check In - 01/29/17 1100      Check-In   Location [facility]   Staff Present Diane Coad, MS, EP, [facility], Exercise Physiologist;Taniaya Rudder Wynetta Emery, RN, BSN;Gregory Cowan, BS, EP, Exercise Physiologist   Supervising physician immediately available to respond to emergencies See telemetry face sheet for immediately available MD   Medication changes reported     No   Fall or balance concerns reported    No   Warm-up and Cool-down Performed as group-led instruction   Resistance Training Performed Yes   VAD Patient? No     Pain Assessment   Currently in Pain? No/denies   Pain Score 0-No pain   Multiple Pain Sites No      Capillary Blood Glucose: No results found for this or any previous visit (from the past 24 hour(s)).    History  Smoking Status  . Former Smoker  . Types: Cigarettes  . Quit date: 05/01/1991  Smokeless Tobacco  . Never Used    Comment: Quit 30 years ago.      Goals Met:  Independence with exercise equipment Exercise tolerated well No report of cardiac concerns or symptoms Strength training completed today  Goals Unmet:  Not Applicable  Comments: Check out 1200.   Dr. Kate Sable is Medical Director for [facility] Card[facility] and Pulmonary Rehab.

## 2017-02-01 ENCOUNTER — Ambulatory Visit: Payer: Self-pay | Admitting: Internal Medicine

## 2017-02-01 ENCOUNTER — Ambulatory Visit (INDEPENDENT_AMBULATORY_CARE_PROVIDER_SITE_OTHER): Payer: Medicare Other | Admitting: Internal Medicine

## 2017-02-01 ENCOUNTER — Encounter: Payer: Self-pay | Admitting: Internal Medicine

## 2017-02-01 ENCOUNTER — Encounter (HOSPITAL_COMMUNITY)
Admission: RE | Admit: 2017-02-01 | Discharge: 2017-02-01 | Disposition: A | Discharge status: Home / Self Care | Payer: Medicare Other | Source: Ambulatory Visit | Attending: Cardiology | Admitting: Cardiology

## 2017-02-01 VITALS — BP 128/74 | HR 62 | Ht 72.0 in | Wt 237.4 lb

## 2017-02-01 DIAGNOSIS — I5020 Unspecified systolic (congestive) heart failure: Secondary | ICD-10-CM

## 2017-02-01 DIAGNOSIS — I110 Hypertensive heart disease with heart failure: Secondary | ICD-10-CM | POA: Diagnosis not present

## 2017-02-01 DIAGNOSIS — I472 Ventricular tachycardia, unspecified: Secondary | ICD-10-CM

## 2017-02-01 DIAGNOSIS — Z952 Presence of prosthetic heart valve: Secondary | ICD-10-CM | POA: Diagnosis not present

## 2017-02-01 DIAGNOSIS — Z954 Presence of other heart-valve replacement: Secondary | ICD-10-CM

## 2017-02-01 DIAGNOSIS — Z951 Presence of aortocoronary bypass graft: Secondary | ICD-10-CM

## 2017-02-01 NOTE — Progress Notes (Signed)
Cardiology Office Note  Date: 02/02/2017   ID: BRENDIN SITU, DOB 13-Apr-1946, MRN [id number]  PCP: [name], MD  Primary Cardiologist: Rozann Lesches, MD   Chief Complaint  Patient presents with  . Cardiac follow-up    History of Present Illness: Charles [name] is a medically complex 71 y.o. male that I last saw in May. He has a history of bacterial endocarditis and severe aortic regurgitation, status post aortic root replacement with allograft, 1 vessel CABG, and closure of PFO in June with Dr. Roxy Manns. He is participating in cardiac rehabilitation at this time.  He presents with his wife today for a follow-up visit. States that he has been doing well, enjoying cardiac rehabilitation. He does not report any angina symptoms and has stable NYHA class II dyspnea. He denies palpitations and syncope.  He continues to follow closely with Dr. Rayann Heman in the device clinic. He has history of polymorphic VT, has done well without recurrence having been weaned off of amiodarone and mexiletine. He has had a LifeVest in place, this was just stopped yesterday in fact by Dr. Rayann Heman after discussion with the patient. He does not plan to pursue EP study or ICD.  Last echocardiogram was in June as outlined below. We discussed obtaining an updated study.  I reviewed his medications which are outlined below and stable.  Past Medical History:  Diagnosis Date  . Anemia   . Aortic insufficiency    a. 07/07/2016: TEE showing bicuspid aortic valve with severe eccentric AI   . Ascending aortic aneurysm (HCC)    4.4 cm by CT 06/2016  . 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 Bicuspid aortic valve   . Chronic periodontitis   . Coronary artery disease   . Pneumonia 1978  . 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 S/P patent foramen ovale closure 10/02/2016    Past Surgical History:  Procedure Laterality Date  . AORTIC VALVE REPLACEMENT N/A 10/02/2016   Procedure: AORTIC ROOT REPLACEMENT  - using 34mm Allograft;  Surgeon: [name], MD;  Location: [place];  Service: Open Heart Surgery;  Laterality: N/A;  . CORONARY ARTERY BYPASS GRAFT N/A 10/02/2016   Procedure: CORONARY ARTERY BYPASS GRAFTING (CABG) x one, using right leg greater saphenous vein harvested endoscopically;  Surgeon: [name], MD;  Location: [place];  Service: Open Heart Surgery;  Laterality: N/A;  . FALSE ANEURYSM REPAIR  10/02/2016   Procedure: REPAIR FALSE ANEURYSM of Aortic Root Resection and Grafting of Aortic Root;  Surgeon: [name], MD;  Location: [place];  Service: Open Heart Surgery;;  . HERNIA REPAIR Left   . LEFT HEART CATH AND CORS/GRAFTS ANGIOGRAPHY N/A 10/07/2016   Procedure: Left Heart Cath and Cors/Grafts Angiography;  Surgeon: Burnell Blanks, MD;  Location: [place] CV [facility];  Service: Cardiovascular;  Laterality: N/A;  . MULTIPLE EXTRACTIONS WITH ALVEOLOPLASTY N/A 08/13/2016   Procedure: Extraction of tooth #'s 2,4,14 with alveoloplasty;  Surgeon: Lenn Cal, DDS;  Location: [place];  Service: Oral Surgery;  Laterality: N/A;  . PATENT FORAMEN OVALE(PFO) CLOSURE N/A 10/02/2016   Procedure: PATENT FORAMEN OVALE (PFO) CLOSURE;  Surgeon: [name], MD;  Location: [place];  Service: Open Heart Surgery;  Laterality: N/A;  . RIGHT/LEFT HEART CATH AND CORONARY ANGIOGRAPHY N/A 08/03/2016   Procedure: Right/Left Heart Cath and Coronary Angiography;  Surgeon: Burnell Blanks, MD;  Location: [place] [facility];  Service: Cardiovascular;  Laterality: N/A;  . TEE WITHOUT CARDIOVERSION N/A 07/07/2016   Procedure: TRANSESOPHAGEAL ECHOCARDIOGRAM (TEE);  Surgeon: Larey Dresser, MD;  Location: [place];  Service: Cardiovascular;  Laterality: N/A;  . TEE WITHOUT CARDIOVERSION N/A 10/02/2016   Procedure: TRANSESOPHAGEAL ECHOCARDIOGRAM (TEE);  Surgeon: [name], MD;  Location: [place];  Service: Open Heart Surgery;  Laterality: N/A;  . THORACIC AORTIC ANEURYSM REPAIR N/A 10/02/2016   Procedure: THORACIC ASCENDING ANEURYSM REPAIR  - using 43mm Allograft;  Reimplantation of Left Coronary Button;  Surgeon: [name], MD;  Location: [place];  Service: Open Heart Surgery;  Laterality: N/A;    Current Outpatient Prescriptions  Medication Sig Dispense Refill  . aspirin EC 81 MG tablet Take 81 mg by mouth daily.    Marland Kitchen atorvastatin (LIPITOR) 20 MG tablet Take 1 tablet (20 mg total) by mouth daily. 90 tablet 1  . furosemide (LASIX) 20 MG tablet Take 20 mg by mouth daily.    . IRON PO Take 1 tablet by mouth daily. OTC iron    . metoprolol tartrate (LOPRESSOR) 25 MG tablet Take 1 tablet (25 mg total) by mouth 2 (two) times daily. Hold for HR less than 50 180 tablet 1  . Multiple Vitamin (MULTIVITAMIN WITH MINERALS) TABS tablet Take 1 tablet by mouth daily.    . potassium chloride SA (K-DUR,KLOR-CON) 20 MEQ tablet Take 1 tablet (20 mEq total) by mouth daily. 90 tablet 1   No current facility-administered medications for this visit.    Allergies:  No known allergies   Social History: The patient  reports that he quit smoking about 25 years ago. His smoking use included Cigarettes. He has never used smokeless tobacco. He reports that he does not drink alcohol or use drugs.   ROS:  Please see the history of present illness. Otherwise, complete review of systems is positive for none.  All other systems are reviewed and negative.   Physical Exam: VS:  BP 104/64   Pulse 99   Ht 6' (1.829 m)   Wt 238 lb (108 kg)   SpO2 97%   BMI 32.28 kg/m , BMI Body mass index is 32.28 kg/m.  Wt Readings from Last 3 Encounters:  02/02/17 238 lb (108 kg)  02/01/17 237 lb  6.4 oz (107.7 kg)  12/17/16 231 lb 14.8 oz (105.2 kg)    General: Patient appears comfortable at rest. HEENT: Conjunctiva and lids normal, oropharynx clear. Neck: Supple, no elevated JVP or carotid bruits, no thyromegaly. Lungs: Clear to auscultation, nonlabored breathing at rest. Thorax: Well-healed sternal incision. Cardiac: Regular rate and rhythm, no S3, soft systolic murmur, no pericardial rub. Abdomen: Soft, nontender, bowel sounds present, no guarding or rebound. Extremities: Mild lower leg edema, right greater than left, distal pulses 2+. Skin: Warm and dry. Musculoskeletal: No kyphosis. Neuropsychiatric: Alert and oriented x3, affect grossly appropriate.  ECG: I personally reviewed the tracing from 11/02/2016 which showed sinus bradycardia with prolonged PR interval, left anterior fascicular block, increased voltage with repolarization abnormalities.  Recent Labwork: 07/01/2016: TSH 1.300 09/24/2016: B Natriuretic Peptide 253.0 10/07/2016: Magnesium 2.7 10/08/2016: ALT 14; AST 13 10/13/2016: Hemoglobin 8.3; Platelets 369 10/14/2016: BUN 18; Creatinine, Ser 1.42; Potassium  4.1; Sodium 140     Component Value Date/Time   CHOL 122 07/03/2016 0715   TRIG 125 07/03/2016 0715   HDL 16 (L) 07/03/2016 0715   CHOLHDL 7.6 07/03/2016 0715   VLDL 25 07/03/2016 0715   LDLCALC 81 07/03/2016 0715    Other Studies Reviewed Today:  Cardiac catheterization 10/07/2016:  Prox Cx to Mid Cx lesion, 30 %stenosed.  Mid LAD lesion, 20 %stenosed.  Ost 2nd Diag lesion, 20 %stenosed.  Dist LAD lesion, 40 %stenosed.  Ost RCA lesion, 100 %stenosed.  SVG graft was visualized by angiography and is very large and anatomically normal.   1. 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 Patent SVG to PDA. The entire RCA fills from the patent vein graft.  3. Mild disease in the Circumflex and LAD. No change since last cath.  4. Normal  LVEDP  Echocardiogram 10/06/2016: Study Conclusions  - Left ventricle: The cavity size was normal. Wall thickness was   increased in a pattern of moderate LVH. Systolic function was   mildly to moderately reduced. The estimated ejection fraction was   in the range of 40% to 45%. Diffuse hypokinesis with more severe   anteroseptal hypokinesis/dyskinesis. The study is not technically   sufficient to allow evaluation of LV diastolic function. - Aortic valve: Poorly visualized. There was mild regurgitation. - Aorta: The aortic root has been replaced - measures 3.9 cm. - Mitral valve: Mildly thickened leaflets . There was mild   regurgitation. - Left atrium: The atrium was normal in size. - Right atrium: The atrium was mildly dilated. - Tricuspid valve: There was trivial regurgitation. - Pulmonary arteries: PA peak pressure: 22 mm Hg (S). - Inferior vena cava: The vessel was normal in size. The   respirophasic diameter changes were in the normal range (>= 50%),   consistent with normal central venous pressure.  Impressions:  - Compared to a prior study on 09/28/2016, the LVEF is lower at   40-45% with global hypokinesis and more severe   anteroseptal/septal hypokinesis/dyskinesis.  Assessment and Plan:  1. History of bacterial endocarditis with severe aortic regurgitation now status post aortic root replacement with allograft, 1 vessel CABG, and closure of PFO in June with Dr. Roxy Manns. Patient is doing very well in cardiac rehabilitation. He is also motivated to continue with maintenance exercise. We will plan to continue current medical therapy and update his echocardiogram for reassessment of LVEF and valve status.  2. History of polymorphic VT, no recurrence as noted above. He is now completely off antiarrhythmic therapy and had his Lifevest discontinued yesterday by Dr. Rayann Heman. No plan to pursue EP study or ICD at this time.  3. CAD status post CABG with SVG to RCA in June. No active  angina symptoms. He continues on aspirin and statin.  4. Cardiomyopathy, LVEF 40-45% as of June. Echocardiogram will be updated.  Current medicines were reviewed with the patient today.   Orders Placed This Encounter  Procedures  . ECHOCARDIOGRAM COMPLETE    Disposition: Follow-up in 3 months.  Signed, Satira Sark, MD, [facility] 02/02/2017 2:27 PM    [place] [facility] [address], [place], [place] [address] Phone: [phone]; Fax: [phone]

## 2017-02-01 NOTE — Progress Notes (Signed)
PCP: Charles [name], Charles [name] Primary Cardiologist: Dr Charles [name] Primary EP: Dr Charles [name] is a 71 y.o. male who presents today for routine electrophysiology followup.  Since last being seen in our clinic, the patient reports doing very well.  He continues to progress with cardiac rehab.  No symptoms of arrhythmia or lifevest shocks.  Today, he denies symptoms of palpitations, chest pain, shortness of breath,  lower extremity edema, dizziness, presyncope, or syncope.  The patient is otherwise without complaint today.   Past Medical History:  Diagnosis Date  . Anemia   . Aortic insufficiency    a. 07/07/2016: TEE showing bicuspid aortic valve with severe eccentric AI   . Ascending aortic aneurysm (HCC)    4.4 cm by CT 06/2016  . 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 Bicuspid aortic valve   . Chronic periodontitis   . Coronary artery disease   . Pneumonia 1978  . 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 S/P patent foramen ovale closure 10/02/2016   Past Surgical History:  Procedure Laterality Date  . AORTIC VALVE REPLACEMENT N/A 10/02/2016   Procedure: AORTIC ROOT REPLACEMENT  - using 21mm Allograft;  Surgeon: Charles [name], Charles [name];  Location: [place];  Service: Open Heart Surgery;  Laterality: N/A;  . CORONARY ARTERY BYPASS GRAFT N/A 10/02/2016   Procedure: CORONARY ARTERY BYPASS GRAFTING (CABG) x one, using right leg greater saphenous vein harvested endoscopically;  Surgeon: Charles [name], Charles [name];  Location: [place];  Service: Open Heart Surgery;  Laterality: N/A;  . FALSE ANEURYSM REPAIR  10/02/2016   Procedure: REPAIR FALSE ANEURYSM of Aortic Root Resection and Grafting of  Aortic Root;  Surgeon: Charles [name], Charles [name];  Location: [place];  Service: Open Heart Surgery;;  . HERNIA REPAIR Left   . LEFT HEART CATH AND CORS/GRAFTS ANGIOGRAPHY N/A 10/07/2016   Procedure: Left Heart Cath and Cors/Grafts Angiography;  Surgeon: Charles [name], Charles [name];  Location: [place];  Service: Cardiovascular;  Laterality: N/A;  . MULTIPLE EXTRACTIONS WITH ALVEOLOPLASTY N/A 08/13/2016   Procedure: Extraction of tooth #'s 2,4,14 with alveoloplasty;  Surgeon: Charles [name], DDS;  Location: [place];  Service: Oral Surgery;  Laterality: N/A;  . PATENT FORAMEN OVALE(PFO) CLOSURE N/A 10/02/2016   Procedure: PATENT FORAMEN OVALE (PFO) CLOSURE;  Surgeon: Charles [name], Charles [name];  Location: [place];  Service: Open Heart Surgery;  Laterality: N/A;  . RIGHT/LEFT HEART CATH AND CORONARY ANGIOGRAPHY N/A 08/03/2016   Procedure: Right/Left Heart Cath and Coronary Angiography;  Surgeon: Charles [name], Charles [name];  Location: [place] [facility];  Service: Cardiovascular;  Laterality: N/A;  . TEE WITHOUT CARDIOVERSION N/A 07/07/2016   Procedure: TRANSESOPHAGEAL ECHOCARDIOGRAM (TEE);  Surgeon: Charles [name], Charles [name];  Location: [place];  Service: Cardiovascular;  Laterality: N/A;  . TEE WITHOUT CARDIOVERSION N/A 10/02/2016   Procedure: TRANSESOPHAGEAL ECHOCARDIOGRAM (TEE);  Surgeon: Charles [name], Charles [name];  Location: [place];  Service: Open Heart Surgery;  Laterality: N/A;  . THORACIC AORTIC ANEURYSM REPAIR N/A 10/02/2016   Procedure: THORACIC ASCENDING ANEURYSM REPAIR  - using 58mm Allograft;  Reimplantation of Left Coronary Button;  Surgeon: Charles [name],  Charles [name];  Location: [facility];  Service: Open Heart Surgery;  Laterality: N/A;    ROS- all systems are reviewed and negatives except as per HPI above  Current Outpatient Prescriptions  Medication Sig Dispense Refill  . aspirin EC 81 MG tablet Take 81 mg by mouth daily.    Marland Kitchen atorvastatin (LIPITOR) 20 MG tablet Take 1 tablet (20 mg total) by mouth daily. 90  tablet 1  . furosemide (LASIX) 20 MG tablet Take 20 mg by mouth daily.    . IRON PO Take 1 tablet by mouth daily. OTC iron    . metoprolol tartrate (LOPRESSOR) 25 MG tablet Take 1 tablet (25 mg total) by mouth 2 (two) times daily. Hold for HR less than 50 180 tablet 1  . Multiple Vitamin (MULTIVITAMIN WITH MINERALS) TABS tablet Take 1 tablet by mouth daily.    . potassium chloride SA (K-DUR,KLOR-CON) 20 MEQ tablet Take 1 tablet (20 mEq total) by mouth daily. 90 tablet 1   No current facility-administered medications for this visit.     Physical Exam: Vitals:   02/01/17 1634  BP: 128/74  Pulse: 62  SpO2: 98%  Weight: 237 lb 6.4 oz (107.7 kg)  Height: 6' (1.829 m)     GEN- The patient is well appearing, alert and oriented x 3 today.   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  He is clear that he would prefer a conservative route of stopping lifevest and treating medically.  He is not interested in EPS or ICD.  Given prior endocarditis, I have not been excited previously about ICD in him. We have therefore agreed to not pursue EP study at this time. I have discontinued his lifevest. He will follow-up with Dr Charles [name] and Charles [name] as scheduled I will see as needed going forward  2. CAD No ischemic symptoms No changes  3. 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 Hypertensive cardiovascular disease Stable No change required today  I will see as needed going forward OK to resume driving 55/97/41 (6 months from last VT episode)  Charles Grayer Charles [name], [facility] 02/01/2017 4:44 PM

## 2017-02-01 NOTE — Patient Instructions (Addendum)
Medication Instructions:  Your physician recommends that you continue on your current medications as directed. Please refer to the Current Medication list given to you today.  -- If you need a refill on your cardiac medications before your next appointment, please call your pharmacy. --  Labwork: None ordered  Testing/Procedures: None ordered  Follow-Up:  Your physician wants you to follow-up as needed with Dr. Rayann Heman.     Thank you for choosing [facility]!!   [phone]  Any Other Special Instructions Will Be Listed Below (If Applicable).

## 2017-02-01 NOTE — Progress Notes (Signed)
Daily Session Note  Patient Details  Name: Charles [name] MRN: [id number] Date of Birth: 1945-07-28 Referring Provider:     CARDIAC REHAB PHASE II ORIENTATION from 12/17/2016 in [place]  Referring Provider  DR. Domenic Polite      Encounter Date: 02/01/2017  Check In:     Session Check In - 02/01/17 1111      Check-In   Location [facility]   Staff Present Aundra Dubin, RN, BSN;Guelda Batson Luther Parody, BS, EP, Exercise Physiologist   Supervising physician immediately available to respond to emergencies See telemetry face sheet for immediately available MD   Medication changes reported     No   Fall or balance concerns reported    No   Warm-up and Cool-down Performed as group-led instruction   Resistance Training Performed Yes   VAD Patient? No     Pain Assessment   Currently in Pain? No/denies   Pain Score 0-No pain   Multiple Pain Sites No      Capillary Blood Glucose: No results found for this or any previous visit (from the past 24 hour(s)).    History  Smoking Status  . Former Smoker  . Types: Cigarettes  . Quit date: 05/01/1991  Smokeless Tobacco  . Never Used    Comment: Quit 30 years ago.      Goals Met:  Independence with exercise equipment Exercise tolerated well No report of cardiac concerns or symptoms Strength training completed today  Goals Unmet:  Not Applicable  Comments: Check out 1200   Dr. Kate Sable is Medical Director for [place] and Pulmonary Rehab.

## 2017-02-02 ENCOUNTER — Ambulatory Visit (INDEPENDENT_AMBULATORY_CARE_PROVIDER_SITE_OTHER): Payer: Medicare Other | Admitting: Cardiology

## 2017-02-02 ENCOUNTER — Encounter: Payer: Self-pay | Admitting: Cardiology

## 2017-02-02 VITALS — BP 104/64 | HR 99 | Ht 72.0 in | Wt 238.0 lb

## 2017-02-02 DIAGNOSIS — Z954 Presence of other heart-valve replacement: Secondary | ICD-10-CM | POA: Diagnosis not present

## 2017-02-02 DIAGNOSIS — I472 Ventricular tachycardia: Secondary | ICD-10-CM

## 2017-02-02 DIAGNOSIS — I429 Cardiomyopathy, unspecified: Secondary | ICD-10-CM

## 2017-02-02 DIAGNOSIS — I25119 Atherosclerotic heart disease of native coronary artery with unspecified angina pectoris: Secondary | ICD-10-CM

## 2017-02-02 DIAGNOSIS — I4729 Other ventricular tachycardia: Secondary | ICD-10-CM

## 2017-02-02 NOTE — Patient Instructions (Signed)
Your physician recommends that you schedule a follow-up appointment in:  3 months with Dr.McDowell      Your physician has requested that you have an echocardiogram. Echocardiography is a painless test that uses sound waves to create images of your heart. It provides your doctor with information about the size and shape of your heart and how well your heart's chambers and valves are working. This procedure takes approximately one hour. There are no restrictions for this procedure.      Your physician recommends that you continue on your current medications as directed. Please refer to the Current Medication list given to you today.      If you need a refill on your cardiac medications before your next appointment, please call your pharmacy.       No lab work ordered today.        Thank you for choosing [facility] !

## 2017-02-03 ENCOUNTER — Encounter (HOSPITAL_COMMUNITY)
Admission: RE | Admit: 2017-02-03 | Discharge: 2017-02-03 | Disposition: A | Discharge status: Home / Self Care | Payer: Medicare Other | Source: Ambulatory Visit | Attending: Cardiology | Admitting: Cardiology

## 2017-02-03 DIAGNOSIS — Z954 Presence of other heart-valve replacement: Secondary | ICD-10-CM

## 2017-02-03 DIAGNOSIS — Z951 Presence of aortocoronary bypass graft: Secondary | ICD-10-CM

## 2017-02-03 DIAGNOSIS — Z952 Presence of prosthetic heart valve: Secondary | ICD-10-CM | POA: Diagnosis not present

## 2017-02-03 NOTE — Progress Notes (Signed)
Daily Session Note  Patient Details  Name: Charles [name] MRN: [id number] Date of Birth: Nov 24, 1945 Referring Provider:     CARDIAC REHAB PHASE II ORIENTATION from 12/17/2016 in [place]  Referring Provider  DR. Domenic Polite      Encounter Date: 02/03/2017  Check In:     Session Check In - 02/03/17 1111      Check-In   Location [facility]   Staff Present Aundra Dubin, RN, BSN;Murphy Duzan Luther Parody, BS, EP, Exercise Physiologist   Supervising physician immediately available to respond to emergencies See telemetry face sheet for immediately available MD   Medication changes reported     No   Fall or balance concerns reported    No   Warm-up and Cool-down Performed as group-led instruction   Resistance Training Performed No   VAD Patient? No     Pain Assessment   Currently in Pain? No/denies   Pain Score 0-No pain   Multiple Pain Sites No      Capillary Blood Glucose: No results found for this or any previous visit (from the past 24 hour(s)).    History  Smoking Status  . Former Smoker  . Types: Cigarettes  . Quit date: 05/01/1991  Smokeless Tobacco  . Never Used    Comment: Quit 30 years ago.      Goals Met:  Independence with exercise equipment Exercise tolerated well  Goals Unmet:  Not Applicable  Comments: Check out 1200   Dr. Kate Sable is Medical Director for [place] and Pulmonary Rehab.

## 2017-02-05 ENCOUNTER — Encounter (HOSPITAL_COMMUNITY)
Admission: RE | Admit: 2017-02-05 | Discharge: 2017-02-05 | Disposition: A | Discharge status: Home / Self Care | Payer: Medicare Other | Source: Ambulatory Visit | Attending: Cardiology | Admitting: Cardiology

## 2017-02-05 DIAGNOSIS — Z951 Presence of aortocoronary bypass graft: Secondary | ICD-10-CM

## 2017-02-05 DIAGNOSIS — Z954 Presence of other heart-valve replacement: Secondary | ICD-10-CM

## 2017-02-05 DIAGNOSIS — Z952 Presence of prosthetic heart valve: Secondary | ICD-10-CM | POA: Diagnosis not present

## 2017-02-05 NOTE — Progress Notes (Signed)
Daily Session Note  Patient Details  Name: Charles [name] MRN: [id number] Date of Birth: 03-06-1946 Referring Provider:     CARDIAC REHAB PHASE II ORIENTATION from 12/17/2016 in [place]  Referring Provider  DR. Domenic Polite      Encounter Date: 02/05/2017  Check In:     Session Check In - 02/05/17 1104      Check-In   Location [facility]   Staff Present Aundra Dubin, RN, BSN;Dawsen Krieger Luther Parody, BS, EP, Exercise Physiologist   Supervising physician immediately available to respond to emergencies See telemetry face sheet for immediately available MD   Medication changes reported     No   Fall or balance concerns reported    No   Warm-up and Cool-down Performed as group-led instruction   Resistance Training Performed Yes   VAD Patient? No     Pain Assessment   Currently in Pain? No/denies   Pain Score 0-No pain   Multiple Pain Sites No      Capillary Blood Glucose: No results found for this or any previous visit (from the past 24 hour(s)).    History  Smoking Status  . Former Smoker  . Types: Cigarettes  . Quit date: 05/01/1991  Smokeless Tobacco  . Never Used    Comment: Quit 30 years ago.      Goals Met:  Independence with exercise equipment Exercise tolerated well No report of cardiac concerns or symptoms Strength training completed today  Goals Unmet:  Not Applicable  Comments: Check out 1200   Dr. Kate Sable is Medical Director for [place] and Pulmonary Rehab.

## 2017-02-08 ENCOUNTER — Ambulatory Visit (HOSPITAL_COMMUNITY)
Admission: RE | Admit: 2017-02-08 | Discharge: 2017-02-08 | Disposition: A | Discharge status: Home / Self Care | Payer: Medicare Other | Source: Ambulatory Visit | Attending: Cardiology | Admitting: Cardiology

## 2017-02-08 ENCOUNTER — Encounter (HOSPITAL_COMMUNITY)
Admission: RE | Admit: 2017-02-08 | Discharge: 2017-02-08 | Disposition: A | Discharge status: Home / Self Care | Payer: Medicare Other | Source: Ambulatory Visit | Attending: Cardiology | Admitting: Cardiology

## 2017-02-08 DIAGNOSIS — Z952 Presence of prosthetic heart valve: Secondary | ICD-10-CM | POA: Diagnosis not present

## 2017-02-08 DIAGNOSIS — I061 Rheumatic aortic insufficiency: Secondary | ICD-10-CM | POA: Insufficient documentation

## 2017-02-08 DIAGNOSIS — I429 Cardiomyopathy, unspecified: Secondary | ICD-10-CM

## 2017-02-08 DIAGNOSIS — Z954 Presence of other heart-valve replacement: Secondary | ICD-10-CM

## 2017-02-08 DIAGNOSIS — Z951 Presence of aortocoronary bypass graft: Secondary | ICD-10-CM

## 2017-02-08 NOTE — Progress Notes (Signed)
Daily Session Note  Patient Details  Name: Charles [name] MRN: [id number] Date of Birth: 11-26-1945 Referring Provider:     CARDIAC REHAB PHASE II ORIENTATION from 12/17/2016 in [place]  Referring Provider  DR. Domenic Polite      Encounter Date: 02/08/2017  Check In:     Session Check In - 02/08/17 1104      Check-In   Location [facility]   Staff Present Aundra Dubin, RN, BSN;Rennae Ferraiolo Luther Parody, BS, EP, Exercise Physiologist   Supervising physician immediately available to respond to emergencies See telemetry face sheet for immediately available MD   Medication changes reported     No   Fall or balance concerns reported    No   Warm-up and Cool-down Performed as group-led instruction   Resistance Training Performed Yes   VAD Patient? No     Pain Assessment   Currently in Pain? No/denies   Pain Score 0-No pain   Multiple Pain Sites No      Capillary Blood Glucose: No results found for this or any previous visit (from the past 24 hour(s)).    History  Smoking Status  . Former Smoker  . Types: Cigarettes  . Quit date: 05/01/1991  Smokeless Tobacco  . Never Used    Comment: Quit 30 years ago.      Goals Met:  Independence with exercise equipment Exercise tolerated well No report of cardiac concerns or symptoms Strength training completed today  Goals Unmet:  Not Applicable  Comments: Check out 1200   Dr. Kate Sable is Medical Director for [place] and Pulmonary Rehab.

## 2017-02-08 NOTE — Progress Notes (Signed)
*  PRELIMINARY RESULTS* Echocardiogram 2D Echocardiogram has been performed.  Leavy Cella 02/08/2017, 10:34 AM

## 2017-02-10 ENCOUNTER — Encounter (HOSPITAL_COMMUNITY)
Admission: RE | Admit: 2017-02-10 | Discharge: 2017-02-10 | Disposition: A | Discharge status: Home / Self Care | Payer: Medicare Other | Source: Ambulatory Visit | Attending: Cardiology | Admitting: Cardiology

## 2017-02-10 DIAGNOSIS — Z951 Presence of aortocoronary bypass graft: Secondary | ICD-10-CM

## 2017-02-10 DIAGNOSIS — Z952 Presence of prosthetic heart valve: Secondary | ICD-10-CM | POA: Diagnosis not present

## 2017-02-10 DIAGNOSIS — Z954 Presence of other heart-valve replacement: Secondary | ICD-10-CM

## 2017-02-10 NOTE — Progress Notes (Signed)
Daily Session Note  Patient Details  Name: NICOLIS BOODY MRN: [id number] Date of Birth: Aug 12, 1945 Referring Provider:     CARDIAC REHAB PHASE II ORIENTATION from 12/17/2016 in [place]  Referring Provider  DR. Domenic Polite      Encounter Date: 02/10/2017  Check In:     Session Check In - 02/10/17 1104      Check-In   Location [facility]   Staff Present Suzanne Boron, BS, EP, Exercise Physiologist;Debra Wynetta Emery, RN, BSN   Supervising physician immediately available to respond to emergencies See telemetry face sheet for immediately available MD   Medication changes reported     No   Fall or balance concerns reported    No   Warm-up and Cool-down Performed as group-led instruction   Resistance Training Performed Yes   VAD Patient? No     Pain Assessment   Currently in Pain? No/denies   Pain Score 0-No pain   Multiple Pain Sites No      Capillary Blood Glucose: No results found for this or any previous visit (from the past 24 hour(s)).    History  Smoking Status  . Former Smoker  . Types: Cigarettes  . Quit date: 05/01/1991  Smokeless Tobacco  . Never Used    Comment: Quit 30 years ago.      Goals Met:  Independence with exercise equipment Exercise tolerated well No report of cardiac concerns or symptoms Strength training completed today  Goals Unmet:  Not Applicable  Comments: Check out 1200   Dr. Kate Sable is Medical Director for [place] and Pulmonary Rehab.

## 2017-02-10 NOTE — Progress Notes (Signed)
Cardiac Individual Treatment Plan  Patient Details  Name: Charles [name] MRN: [id number] Date of Birth: 08-09-1945 Referring Provider:     CARDIAC REHAB PHASE II ORIENTATION from 12/17/2016 in [place]  Referring Provider  DR. McDowell      Initial Encounter Date:    CARDIAC REHAB PHASE II ORIENTATION from 12/17/2016 in [place]  Date  12/17/16  Referring Provider  DR. McDowell      Visit Diagnosis: S/P aortic valve replacement with allograft  S/P CABG x 1  Patient's Home Medications on Admission:  Current Outpatient Prescriptions:  .  aspirin EC 81 MG tablet, Take 81 mg by mouth daily., Disp: , Rfl:  .  atorvastatin (LIPITOR) 20 MG tablet, Take 1 tablet (20 mg total) by mouth daily., Disp: 90 tablet, Rfl: 1 .  furosemide (LASIX) 20 MG tablet, Take 20 mg by mouth daily., Disp: , Rfl:  .  IRON PO, Take 1 tablet by mouth daily. OTC iron, Disp: , Rfl:  .  metoprolol tartrate (LOPRESSOR) 25 MG tablet, Take 1 tablet (25 mg total) by mouth 2 (two) times daily. Hold for HR less than 50, Disp: 180 tablet, Rfl: 1 .  Multiple Vitamin (MULTIVITAMIN WITH MINERALS) TABS tablet, Take 1 tablet by mouth daily., Disp: , Rfl:  .  potassium chloride SA (K-DUR,KLOR-CON) 20 MEQ tablet, Take 1 tablet (20 mEq total) by mouth daily., Disp: 90 tablet, Rfl: 1  Past Medical History: Past Medical History:  Diagnosis Date  . Anemia   . Aortic insufficiency    a. 07/07/2016: TEE showing bicuspid aortic valve with severe eccentric AI   . Ascending aortic aneurysm (HCC)    4.4 cm by CT 06/2016  . 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 Bicuspid aortic valve   . Chronic periodontitis   . Coronary artery disease   . Pneumonia 1978  . 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 S/P patent foramen ovale closure 10/02/2016    Tobacco Use: History  Smoking Status  . Former Smoker  . Types: Cigarettes  . Quit date: 05/01/1991  Smokeless Tobacco  . Never Used    Comment: Quit 30 years ago.      Labs: Recent Review Flowsheet Data    Labs for ITP Cardiac and Pulmonary Rehab Latest Ref Rng & Units 10/06/2016 10/07/2016 10/07/2016 10/07/2016 10/08/2016   Cholestrol 0 - 200 mg/dL - - - - -   LDLCALC 0 - 99 mg/dL - - - - -   HDL >40 mg/dL - - - - -   Trlycerides <150 mg/dL - - - - -   Hemoglobin A1c 4.8 - 5.6 % - - - - -   PHART 7.350 - 7.450 7.484(H) 7.499(H) - - -   PCO2ART 32.0 - 48.0 mmHg 41.6 44.4 - - -   HCO3 20.0 - 28.0 mmol/L 31.4(H) 34.6(H) - - -   TCO2 0 - 100 mmol/L 33 36 - - -   ACIDBASEDEF 0.0 - 2.0 mmol/L - - - - -   O2SAT % 92.0 98.0 65.1 95.4 65.4      Capillary Blood Glucose: Lab Results  Component  Value Date   GLUCAP 147 (H) 10/05/2016   GLUCAP 97 10/05/2016   GLUCAP 110 (H) 10/05/2016   GLUCAP 90 10/05/2016   GLUCAP 98 10/05/2016     Exercise Target Goals:    Exercise Program Goal: Individual exercise prescription set with THRR, safety & activity barriers. Participant demonstrates ability to understand and report RPE using BORG scale, to self-measure pulse accurately, and to acknowledge the importance of the exercise prescription.  Exercise Prescription Goal: Starting with aerobic activity 30 plus minutes a day, 3 days per week for initial exercise prescription. Provide home exercise prescription and guidelines that participant acknowledges understanding prior to discharge.  Activity Barriers & Risk Stratification:     Activity Barriers & Cardiac Risk Stratification - 12/17/16 0957      Activity Barriers & Cardiac Risk Stratification   Activity Barriers Deconditioning;Incisional Pain   Cardiac Risk  Stratification High      6 Minute Walk:     6 Minute Walk    Row Name 12/17/16 0956         6 Minute Walk   Phase Initial     Distance 1200 feet     Distance % Change 0 %     Distance Feet Change 0 ft     Walk Time 6 minutes     # of Rest Breaks 0     MPH 2.27     METS 2.74     RPE 9     Perceived Dyspnea  7     VO2 Peak 8.77     Symptoms No     Resting HR 61 bpm     Resting BP 100/56     Resting Oxygen Saturation  95 %     Exercise Oxygen Saturation  during 6 min walk 91 %     Max Ex. HR 96 bpm     Max Ex. BP 124/70     2 Minute Post BP 100/58        Oxygen Initial Assessment:   Oxygen Re-Evaluation:   Oxygen Discharge (Final Oxygen Re-Evaluation):   Initial Exercise Prescription:     Initial Exercise Prescription - 12/17/16 0800      Date of Initial Exercise RX and Referring Provider   Date 12/17/16   Referring Provider DR. McDowell     Treadmill   MPH 1.5   Grade 0   Minutes 15   METs 2.2     Recumbant Elliptical   Level 1   RPM 40   Watts 41   Minutes 20   METs 2.4     Prescription Details   Frequency (times per week) 3   Duration Progress to 30 minutes of continuous aerobic without signs/symptoms of physical distress     Intensity   THRR 40-80% of Max Heartrate [phone]   Ratings of Perceived Exertion 11-13   Perceived Dyspnea 0-4     Progression   Progression Continue progressive overload as per policy without signs/symptoms or physical distress.     Resistance Training   Training Prescription Yes   Weight 1   Reps 10-15      Perform Capillary Blood Glucose checks as needed.  Exercise Prescription Changes:      Exercise Prescription Changes    Row Name 12/24/16 1400 12/28/16 1400 01/12/17 0700 01/25/17 1400 02/04/17 0800     Response to Exercise   Blood Pressure (Admit)  - 136/80 124/70 124/74 136/76   Blood Pressure (Exercise)  - 144/82 144/70 160/76  144/72   Blood Pressure (Exit)  - 126/76 124/66 120/70 134/72    Heart Rate (Admit)  - 68 bpm 63 bpm 47 bpm 81 bpm   Heart Rate (Exercise)  - 70 bpm 74 bpm 77 bpm 81 bpm   Heart Rate (Exit)  - 70 bpm 72 bpm 57 bpm 67 bpm   Rating of Perceived Exertion (Exercise)  - 10 11 11 12    Duration  - Progress to 30 minutes of  aerobic without signs/symptoms of physical distress Progress to 30 minutes of  aerobic without signs/symptoms of physical distress Progress to 30 minutes of  aerobic without signs/symptoms of physical distress Progress to 30 minutes of  aerobic without signs/symptoms of physical distress   Intensity  - THRR New  101-118-134 THRR New  98-116-133 THRR New  89-109-130 THRR New  109-123-137     Progression   Progression  - Continue to progress workloads to maintain intensity without signs/symptoms of physical distress. Continue to progress workloads to maintain intensity without signs/symptoms of physical distress. Continue to progress workloads to maintain intensity without signs/symptoms of physical distress. Continue to progress workloads to maintain intensity without signs/symptoms of physical distress.     Resistance Training   Training Prescription Yes Yes Yes Yes Yes   Weight 1 1 2 3 3    Reps 10-15 10-15 10-15 10-15 10-15     Treadmill   MPH 1.5 1.7 2 2.1 2.3   Grade 0 0 0 0 0   Minutes 15 20 20 20 20    METs 2.2 2.3 2.5 2.6 2.7     NuStep   Level  - 2 3 3 3    SPM  - 77 89 103 100   Minutes  - 15 15 15 15    METs  - 1.9 2.4 2.8 2.9     Recumbant Elliptical   Level 1 -  -  -  -   RPM 40 -  -  -  -   Watts 41 -  -  -  -   Minutes 20 -  -  -  -   METs 2.4 -  -  -  -     Home Exercise Plan   Plans to continue exercise at Home (comment) Home (comment) Home (comment) Home (comment) Home (comment)   Frequency Add 2 additional days to program exercise sessions. Add 2 additional days to program exercise sessions. Add 2 additional days to program exercise sessions. Add 2 additional days to program exercise sessions. Add 2 additional  days to program exercise sessions.   Initial Home Exercises Provided 12/21/16 12/21/16 12/21/16 12/21/16 12/21/16      Exercise Comments:      Exercise Comments    Row Name 12/24/16 1433 12/28/16 1444 01/12/17 0750 01/25/17 1421 02/04/17 0803   Exercise Comments Patient was given his take home exercise plan. He expressed an understanding of the plan. We addressed his THR and safe ways to stay active outside of CR. He stated that he would soon do mild exercises at home. He was encouraged to come to me if he had any further questions.  Patient is doing well in the program. He was taken off the recumbent elliptical due to back pain.  Patient is doing well in CR. Patient stated that he currently walks for thirty minutes at a time when he is not at CR.  Patient is doing well in CR. Patient is doing very well in CR. He continues to work out  3 days a week when he is not in CR.      Exercise Goals and Review:      Exercise Goals    Row Name 12/17/16 0958             Exercise Goals   Increase Physical Activity Yes       Intervention Provide advice, education, support and counseling about physical activity/exercise needs.;Develop an individualized exercise prescription for aerobic and resistive training based on initial evaluation findings, risk stratification, comorbidities and participant's personal goals.       Expected Outcomes Achievement of increased cardiorespiratory fitness and enhanced flexibility, muscular endurance and strength shown through measurements of functional capacity and personal statement of participant.       Increase Strength and Stamina Yes       Intervention Provide advice, education, support and counseling about physical activity/exercise needs.;Develop an individualized exercise prescription for aerobic and resistive training based on initial evaluation findings, risk stratification, comorbidities and participant's personal goals.       Expected Outcomes Achievement  of increased cardiorespiratory fitness and enhanced flexibility, muscular endurance and strength shown through measurements of functional capacity and personal statement of participant.       Able to understand and use rate of perceived exertion (RPE) scale Yes       Intervention Provide education and explanation on how to use RPE scale       Expected Outcomes Short Term: Able to use RPE daily in rehab to express subjective intensity level;Long Term:  Able to use RPE to guide intensity level when exercising independently       Able to understand and use Dyspnea scale Yes       Intervention Provide education and explanation on how to use Dyspnea scale       Expected Outcomes Short Term: Able to use Dyspnea scale daily in rehab to express subjective sense of shortness of breath during exertion;Long Term: Able to use Dyspnea scale to guide intensity level when exercising independently       Knowledge and understanding of Target Heart Rate Range (THRR) Yes       Intervention Provide education and explanation of THRR including how the numbers were predicted and where they are located for reference       Expected Outcomes Short Term: Able to state/look up THRR       Able to check pulse independently Yes       Intervention Provide education and demonstration on how to check pulse in carotid and radial arteries.;Review the importance of being able to check your own pulse for safety during independent exercise       Expected Outcomes Short Term: Able to explain why pulse checking is important during independent exercise;Long Term: Able to check pulse independently and accurately       Understanding of Exercise Prescription Yes       Intervention Provide education, explanation, and written materials on patient's individual exercise prescription       Expected Outcomes Short Term: Able to explain program exercise prescription;Long Term: Able to explain home exercise prescription to exercise independently           Exercise Goals Re-Evaluation :     Exercise Goals Re-Evaluation    Row Name 12/28/16 1444 01/12/17 0748 02/04/17 0802         Exercise Goal Re-Evaluation   Exercise Goals Review Increase Physical Activity;Increase Strength and Stamina;Knowledge and understanding of Target Heart Rate Range (THRR) Increase Physical Activity;Increase Strength  and Stamina;Knowledge and understanding of Target Heart Rate Range (THRR) Increase Physical Activity;Increase Strength and Stamina;Knowledge and understanding of Target Heart Rate Range (THRR)     Comments Patient is doing well in CR.  Patient is doing well in CR. Patient stated that he currently walks for thirty minutes at a time when he is not at CR.  Patient is doing very well in CR. He continues to work out 3 days a week when he is not in CR.     Expected Outcomes Patient is trying to get back into better shape and lose weight through program.  Patient wants to gain full mobility and to get back to driving  Goals are to get full mobility and back to driving          Discharge Exercise Prescription (Final Exercise Prescription Changes):     Exercise Prescription Changes - 02/04/17 0800      Response to Exercise   Blood Pressure (Admit) 136/76   Blood Pressure (Exercise) 144/72   Blood Pressure (Exit) 134/72   Heart Rate (Admit) 81 bpm   Heart Rate (Exercise) 81 bpm   Heart Rate (Exit) 67 bpm   Rating of Perceived Exertion (Exercise) 12   Duration Progress to 30 minutes of  aerobic without signs/symptoms of physical distress   Intensity THRR New  [phone]     Progression   Progression Continue to progress workloads to maintain intensity without signs/symptoms of physical distress.     Resistance Training   Training Prescription Yes   Weight 3   Reps 10-15     Treadmill   MPH 2.3   Grade 0   Minutes 20   METs 2.7     NuStep   Level 3   SPM 100   Minutes 15   METs 2.9     Home Exercise Plan   Plans to continue exercise  at Home (comment)   Frequency Add 2 additional days to program exercise sessions.   Initial Home Exercises Provided 12/21/16      Nutrition:  Target Goals: Understanding of nutrition guidelines, daily intake of sodium 1500mg , cholesterol 200mg , calories 30% from fat and 7% or less from saturated fats, daily to have 5 or more servings of fruits and vegetables.  Biometrics:     Pre Biometrics - 12/17/16 0841      Pre Biometrics   Height 6' (1.829 m)   Weight 231 lb 14.8 oz (105.2 kg)   Waist Circumference 44 inches   Hip Circumference 46.5 inches   Waist to Hip Ratio 0.95 %   BMI (Calculated) 31.45   Triceps Skinfold 16 mm   % Body Fat 30.7 %   Grip Strength 83.2 kg   Flexibility 14 in   Single Leg Stand 9 seconds       Nutrition Therapy Plan and Nutrition Goals:     Nutrition Therapy & Goals - 01/18/17 0813      Personal Nutrition Goals   Nutrition Goal For heart healthy choices add >50% of whole grains, make half their plate fruits and vegetables. Discuss the difference between starchy vegetables and leafy greens, and how leafy vegetables provide fiber, helps maintain healthy weight, helps control blood glucose, and lowers cholesterol.  Discuss purchasing fresh or frozen vegetable to reduce sodium and not to add grease, fat or sugar. Consume <18oz of red meat per week. Consume lean cuts of meats and very little of meats high in sodium and nitrates such as pork and lunch meats.  Discussed portion control for all food groups     Intervention Plan   Intervention Nutrition handout(s) given to patient.   Expected Outcomes Short Term Goal: Understand basic principles of dietary content, such as calories, fat, sodium, cholesterol and nutrients.      Nutrition Discharge: Rate Your Plate Scores:   Nutrition Goals Re-Evaluation:     Nutrition Goals Re-Evaluation    Row Name 02/10/17 1325             Goals   Current Weight 234 lb 9.6 oz (106.4 kg)       Nutrition Goal  For heart healthy choices add >50% of whole grains, make half their plate fruits and vegetables. Discuss the difference between starchy vegetables and leafy greens, and how leafy vegetables provide fiber, helps maintain healthy weight, helps control blood glucose, and lowers cholesterol.  Discuss purchasing fresh or frozen vegetable to reduce sodium and not to add grease, fat or sugar. Consume <18oz of red meat per week. Consume lean cuts of meats and very little of meats high in sodium and nitrates such as pork and lunch meats. Discussed portion control for all food groups.         Comment Patient has lost 3 lbs since last 30 Day ITP review. He says he has stopped eating ice cream at night and is trying to eat smaller portions and more vegetables and fruit. Will continue to monitor.        Expected Outcome Patient will continue to work toward meeting his nutrition goals.           Nutrition Goals Discharge (Final Nutrition Goals Re-Evaluation):     Nutrition Goals Re-Evaluation - 02/10/17 1325      Goals   Current Weight 234 lb 9.6 oz (106.4 kg)   Nutrition Goal For heart healthy choices add >50% of whole grains, make half their plate fruits and vegetables. Discuss the difference between starchy vegetables and leafy greens, and how leafy vegetables provide fiber, helps maintain healthy weight, helps control blood glucose, and lowers cholesterol.  Discuss purchasing fresh or frozen vegetable to reduce sodium and not to add grease, fat or sugar. Consume <18oz of red meat per week. Consume lean cuts of meats and very little of meats high in sodium and nitrates such as pork and lunch meats. Discussed portion control for all food groups.     Comment Patient has lost 3 lbs since last 30 Day ITP review. He says he has stopped eating ice cream at night and is trying to eat smaller portions and more vegetables and fruit. Will continue to monitor.    Expected Outcome Patient will continue to work toward  meeting his nutrition goals.       Psychosocial: Target Goals: Acknowledge presence or absence of significant depression and/or stress, maximize coping skills, provide positive support system. Participant is able to verbalize types and ability to use techniques and skills needed for reducing stress and depression.  Initial Review & Psychosocial Screening:     Initial Psych Review & Screening - 12/17/16 1134      Initial Review   Current issues with None Identified     Family Dynamics   Good Support System? Yes     Barriers   Psychosocial barriers to participate in program There are no identifiable barriers or psychosocial needs.     Screening Interventions   Interventions Encouraged to exercise      Quality of Life Scores:     Quality of  Life - 12/17/16 0842      Quality of Life Scores   Health/Function Pre 17.77 %   Socioeconomic Pre 16.36 %   Psych/Spiritual Pre 20.79 %   Family Pre 22.2 %   GLOBAL Pre 18.75 %      PHQ-9: Recent Review Flowsheet Data    Depression screen [facility] 2/9 12/17/2016 09/08/2016 07/28/2016   Decreased Interest - 0 0   Down, Depressed, Hopeless - 0 0   PHQ - 2 Score - 0 0   Altered sleeping 0 - -   Tired, decreased energy 0 - -   Change in appetite 0 - -   Feeling bad or failure about yourself  1 - -   Trouble concentrating 0 - -   Moving slowly or fidgety/restless 0 - -   Suicidal thoughts 0 - -   Difficult doing work/chores Somewhat difficult - -     Interpretation of Total Score  Total Score Depression Severity:  1-4 = Minimal depression, 5-9 = Mild depression, 10-14 = Moderate depression, 15-19 = Moderately severe depression, 20-27 = Severe depression   Psychosocial Evaluation and Intervention:     Psychosocial Evaluation - 12/17/16 1146      Psychosocial Evaluation & Interventions   Interventions Encouraged to exercise with the program and follow exercise prescription   Continue Psychosocial Services  No Follow up required       Psychosocial Re-Evaluation:     Psychosocial Re-Evaluation    Row Name 01/13/17 1301 02/10/17 1331           Psychosocial Re-Evaluation   Current issues with None Identified None Identified      Expected Outcomes Patient will have no psychosocial issues identified at discharge.  Patient will have no psychosocial issues identified at discharge.       Interventions Encouraged to attend Cardiac Rehabilitation for the exercise Encouraged to attend Cardiac Rehabilitation for the exercise;Stress management education;Relaxation education      Continue Psychosocial Services  No Follow up required No Follow up required         Psychosocial Discharge (Final Psychosocial Re-Evaluation):     Psychosocial Re-Evaluation - 02/10/17 1331      Psychosocial Re-Evaluation   Current issues with None Identified   Expected Outcomes Patient will have no psychosocial issues identified at discharge.    Interventions Encouraged to attend Cardiac Rehabilitation for the exercise;Stress management education;Relaxation education   Continue Psychosocial Services  No Follow up required      Vocational Rehabilitation: Provide vocational rehab assistance to qualifying candidates.   Vocational Rehab Evaluation & Intervention:     Vocational Rehab - 12/17/16 1130      Initial Vocational Rehab Evaluation & Intervention   Assessment shows need for Vocational Rehabilitation No      Education: Education Goals: Education classes will be provided on a weekly basis, covering required topics. Participant will state understanding/return demonstration of topics presented.  Learning Barriers/Preferences:     Learning Barriers/Preferences - 12/17/16 1128      Learning Barriers/Preferences   Learning Barriers None   Learning Preferences Individual Instruction;Group Instruction;Skilled Demonstration      Education Topics: Hypertension, Hypertension Reduction -Define heart disease and high blood  pressure. Discus how high blood pressure affects the body and ways to reduce high blood pressure.   Exercise and Your Heart -Discuss why it is important to exercise, the FITT principles of exercise, normal and abnormal responses to exercise, and how to exercise safely.   Angina -Discuss definition  of angina, causes of angina, treatment of angina, and how to decrease risk of having angina.   Cardiac Medications -Review what the following cardiac medications are used for, how they affect the body, and side effects that may occur when taking the medications.  Medications include Aspirin, Beta blockers, calcium channel blockers, ACE Inhibitors, angiotensin receptor blockers, diuretics, digoxin, and antihyperlipidemics.   Congestive Heart Failure -Discuss the definition of CHF, how to live with CHF, the signs and symptoms of CHF, and how keep track of weight and sodium intake.   Heart Disease and Intimacy -Discus the effect sexual activity has on the heart, how changes occur during intimacy as we age, and safety during sexual activity.   Smoking Cessation / COPD -Discuss different methods to quit smoking, the health benefits of quitting smoking, and the definition of COPD.   CARDIAC REHAB PHASE II EXERCISE from 02/03/2017 in [place]  Date  12/23/16  Educator  DJ  Instruction Review Code  2- Demonstrated Understanding      Nutrition I: Fats -Discuss the types of cholesterol, what cholesterol does to the heart, and how cholesterol levels can be controlled.   CARDIAC REHAB PHASE II EXERCISE from 02/03/2017 in [place]  Date  12/30/16  Educator  DC  Instruction Review Code  2- Demonstrated Understanding      Nutrition II: Labels -Discuss the different components of food labels and how to read food label   [place] from 02/03/2017 in [place]  Date  01/06/17  Educator  Dc   Instruction Review Code  2- Demonstrated Understanding      Heart Parts and Heart Disease -Discuss the anatomy of the heart, the pathway of blood circulation through the heart, and these are affected by heart disease.   CARDIAC REHAB PHASE II EXERCISE from 02/03/2017 in [place]  Date  01/13/17  Educator  DJ  Instruction Review Code  2- Demonstrated Understanding      Stress I: Signs and Symptoms -Discuss the causes of stress, how stress may lead to anxiety and depression, and ways to limit stress.   Stress II: Relaxation -Discuss different types of relaxation techniques to limit stress.   CARDIAC REHAB PHASE II EXERCISE from 02/03/2017 in [place]  Date  01/27/17  Educator  DJ  Instruction Review Code  2- Demonstrated Understanding      Warning Signs of Stroke / TIA -Discuss definition of a stroke, what the signs and symptoms are of a stroke, and how to identify when someone is having stroke.   CARDIAC REHAB PHASE II EXERCISE from 02/03/2017 in [place]  Date  02/03/17  Educator  [place]  Instruction Review Code  2- Demonstrated Understanding      Knowledge Questionnaire Score:     Knowledge Questionnaire Score - 12/17/16 1130      Knowledge Questionnaire Score   Pre Score 21/24      Core Components/Risk Factors/Patient Goals at Admission:     Personal Goals and Risk Factors at Admission - 12/17/16 1132      Core Components/Risk Factors/Patient Goals on Admission    Weight Management Weight Maintenance   Improve shortness of breath with ADL's Yes   Intervention Provide education, individualized exercise plan and daily activity instruction to help decrease symptoms of SOB with activities of daily living.   Expected Outcomes Short Term: Achieves a reduction of symptoms when performing activities of daily  living.   Personal Goal Other Yes   Personal Goal Full mobility, get back to driving    Intervention Attend CR 3 x week and supplement with 2 days of home exercise.    Expected Outcomes Reach goals.       Core Components/Risk Factors/Patient Goals Review:      Goals and Risk Factor Review    Row Name 01/13/17 1256 02/10/17 1328           Core Components/Risk Factors/Patient Goals Review   Personal Goals Review Weight Management/Obesity;Improve shortness of breath with ADL's  Full mobility and drive again.  Weight Management/Obesity;Improve shortness of breath with ADL's  Full mobility; back to driving.      Review Patient has completed 11 sessions gaining 5.4 lbs since he started the program. Patient was asked about his weight gain. He says he makes his own ice cream and he has been eating it every night. He attributes this to his gain. He has not attended the nutrition class but plans to attend the next class. He says he weighs himself everyday and is to call MD for a weight gain over 3 lbs/day or 5 lbs/wk. He says he does feel stronger and feels like his mobility is improving but has not started driving yet. His MD will release him to drive in December. Will continue to monitor.  Patient has completed 22 sessions. He continue to do well in the program. His life vest was removed 2 weeks ago. Patient says he will be able to drive 04/21/30. He says his mobility is getting better and he has more energy. He is walking on the days he is not in CR. He says he feels better overall since he started the program. Will continue to monitor.       Expected Outcomes Patient will complete the program and continue to work toward meeting his personal goals.  Patient will complete the program and continue to meet his personal goals.          Core Components/Risk Factors/Patient Goals at Discharge (Final Review):      Goals and Risk Factor Review - 02/10/17 1328      Core Components/Risk Factors/Patient Goals Review   Personal Goals Review Weight Management/Obesity;Improve shortness of  breath with ADL's  Full mobility; back to driving.   Review Patient has completed 22 sessions. He continue to do well in the program. His life vest was removed 2 weeks ago. Patient says he will be able to drive 35/57/32. He says his mobility is getting better and he has more energy. He is walking on the days he is not in CR. He says he feels better overall since he started the program. Will continue to monitor.    Expected Outcomes Patient will complete the program and continue to meet his personal goals.       ITP Comments:     ITP Comments    Row Name 12/17/16 1523           ITP Comments Patient new to program. Plans to start Monday 12/21/16.          Comments: ITP 30 Day REVIEW Patient doing well in the program. Will continue to monitor for progress.

## 2017-02-12 ENCOUNTER — Encounter (HOSPITAL_COMMUNITY)
Admission: RE | Admit: 2017-02-12 | Discharge: 2017-02-12 | Disposition: A | Discharge status: Home / Self Care | Payer: Medicare Other | Source: Ambulatory Visit | Attending: Cardiology | Admitting: Cardiology

## 2017-02-12 DIAGNOSIS — Z87891 Personal history of nicotine dependence: Secondary | ICD-10-CM | POA: Diagnosis not present

## 2017-02-12 DIAGNOSIS — Z952 Presence of prosthetic heart valve: Secondary | ICD-10-CM | POA: Insufficient documentation

## 2017-02-12 DIAGNOSIS — Z951 Presence of aortocoronary bypass graft: Secondary | ICD-10-CM

## 2017-02-12 DIAGNOSIS — Z954 Presence of other heart-valve replacement: Secondary | ICD-10-CM

## 2017-02-12 NOTE — Progress Notes (Signed)
Daily Session Note  Patient Details  Name: Charles [name] MRN: [id number] Date of Birth: 12-14-1945 Referring Provider:     CARDIAC REHAB PHASE II ORIENTATION from 12/17/2016 in [facility]  Referring Provider  DR. Domenic Polite      Encounter Date: 02/12/2017  Check In:     Session Check In - 02/12/17 1103      Check-In   Location [facility]   Staff Present Aundra Dubin, RN, BSN;Eugine Bubb Luther Parody, BS, EP, Exercise Physiologist   Supervising physician immediately available to respond to emergencies See telemetry face sheet for immediately available MD   Medication changes reported     No   Fall or balance concerns reported    No   Warm-up and Cool-down Performed as group-led instruction   Resistance Training Performed Yes   VAD Patient? No     Pain Assessment   Currently in Pain? No/denies   Pain Score 0-No pain   Multiple Pain Sites No      Capillary Blood Glucose: No results found for this or any previous visit (from the past 24 hour(s)).    History  Smoking Status  . Former Smoker  . Types: Cigarettes  . Quit date: 05/01/1991  Smokeless Tobacco  . Never Used    Comment: Quit 30 years ago.      Goals Met:  Independence with exercise equipment Exercise tolerated well No report of cardiac concerns or symptoms Strength training completed today  Goals Unmet:  Not Applicable  Comments: Check out 1200   Dr. Kate Sable is Medical Director for [facility] and [facility]monary Re[facility].

## 2017-02-15 ENCOUNTER — Encounter (HOSPITAL_COMMUNITY): Payer: Medicare Other

## 2017-02-17 ENCOUNTER — Encounter (HOSPITAL_COMMUNITY)
Admission: RE | Admit: 2017-02-17 | Discharge: 2017-02-17 | Disposition: A | Discharge status: Home / Self Care | Payer: Medicare Other | Source: Ambulatory Visit | Attending: Cardiology | Admitting: Cardiology

## 2017-02-17 DIAGNOSIS — Z952 Presence of prosthetic heart valve: Secondary | ICD-10-CM | POA: Diagnosis not present

## 2017-02-17 DIAGNOSIS — Z951 Presence of aortocoronary bypass graft: Secondary | ICD-10-CM

## 2017-02-17 DIAGNOSIS — Z954 Presence of other heart-valve replacement: Secondary | ICD-10-CM

## 2017-02-17 NOTE — Progress Notes (Signed)
Daily Session Note  Patient Details  Name: Charles [name] MRN: [id number] Date of Birth: 1945/11/25 Referring Provider:     CARDIAC REHAB PHASE II ORIENTATION from 12/17/2016 in [facility]  Referring Provider  DR. McDowell      Encounter Date: 02/17/2017  Check In: Session Check In - 02/17/17 1107      Check-In   Location  [facility]    Staff Present  Suzanne Boron, BS, EP, Exercise Physiologist;Debra Wynetta Emery, RN, BSN    Supervising physician immediately available to respond to emergencies  See telemetry face sheet for immediately available MD    Medication changes reported      No    Fall or balance concerns reported     No    Warm-up and Cool-down  Performed as group-led instruction    Resistance Training Performed  Yes    VAD Patient?  No      Pain Assessment   Currently in Pain?  No/denies    Pain Score  0-No pain    Multiple Pain Sites  No       Capillary Blood Glucose: No results found for this or any previous visit (from the past 24 hour(s)).    Social History   Tobacco Use  Smoking Status Former Smoker  . Types: Cigarettes  . Last attempt to quit: 05/01/1991  . Years since quitting: 25.8  Smokeless Tobacco Never Used  Tobacco Comment   Quit 30 years ago.      Goals Met:  Independence with exercise equipment Exercise tolerated well No report of cardiac concerns or symptoms Strength training completed today  Goals Unmet:  Not Applicable  Comments: Check out 1200   Dr. Kate Sable is Medical Director for [name] and [facility] Re[facility].

## 2017-02-19 ENCOUNTER — Encounter (HOSPITAL_COMMUNITY)
Admission: RE | Admit: 2017-02-19 | Discharge: 2017-02-19 | Disposition: A | Discharge status: Home / Self Care | Payer: Medicare Other | Source: Ambulatory Visit | Attending: Cardiology | Admitting: Cardiology

## 2017-02-19 DIAGNOSIS — Z951 Presence of aortocoronary bypass graft: Secondary | ICD-10-CM

## 2017-02-19 DIAGNOSIS — Z952 Presence of prosthetic heart valve: Secondary | ICD-10-CM | POA: Diagnosis not present

## 2017-02-19 DIAGNOSIS — Z954 Presence of other heart-valve replacement: Secondary | ICD-10-CM

## 2017-02-19 NOTE — Progress Notes (Signed)
Daily Session Note  Patient Details  Name: Charles [name] MRN: [id number] Date of Birth: 02-26-1946 Referring Provider:     CARDIAC REHAB PHASE II ORIENTATION from 12/17/2016 in [facility]  Referring Provider  DR. Domenic Polite      Encounter Date: 02/19/2017  Check In: Session Check In - 02/19/17 1106      Check-In   Location  [facility]    Staff Present  Suzanne Boron, BS, EP, Exercise Physiologist;Debra Wynetta Emery, RN, BSN    Supervising physician immediately available to respond to emergencies  See telemetry face sheet for immediately available MD    Medication changes reported      No    Fall or balance concerns reported     No    Warm-up and Cool-down  Performed as group-led instruction    Resistance Training Performed  Yes    VAD Patient?  No      Pain Assessment   Currently in Pain?  No/denies    Pain Score  0-No pain    Multiple Pain Sites  No       Capillary Blood Glucose: No results found for this or any previous visit (from the past 24 hour(s)).    Social History   Tobacco Use  Smoking Status Former Smoker  . Types: Cigarettes  . Last attempt to quit: 05/01/1991  . Years since quitting: 25.8  Smokeless Tobacco Never Used  Tobacco Comment   Quit 30 years ago.      Goals Met:  Independence with exercise equipment Exercise tolerated well No report of cardiac concerns or symptoms Strength training completed today  Goals Unmet:  Not Applicable  Comments: Check out 1200   Dr. Kate Sable is Medical Director for [facility] and [facility] Re[facility].

## 2017-02-22 ENCOUNTER — Encounter (HOSPITAL_COMMUNITY)
Admission: RE | Admit: 2017-02-22 | Discharge: 2017-02-22 | Disposition: A | Discharge status: Home / Self Care | Payer: Medicare Other | Source: Ambulatory Visit | Attending: Cardiology | Admitting: Cardiology

## 2017-02-22 DIAGNOSIS — Z954 Presence of other heart-valve replacement: Secondary | ICD-10-CM

## 2017-02-22 DIAGNOSIS — Z951 Presence of aortocoronary bypass graft: Secondary | ICD-10-CM

## 2017-02-22 DIAGNOSIS — Z952 Presence of prosthetic heart valve: Secondary | ICD-10-CM | POA: Diagnosis not present

## 2017-02-22 NOTE — Progress Notes (Signed)
Daily Session Note  Patient Details  Name: Charles [name] MRN: [id number] Date of Birth: June 25, 1945 Referring Provider:     CARDIAC REHAB PHASE II ORIENTATION from 12/17/2016 in [facility]  Referring Provider  DR. McDowell      Encounter Date: 02/22/2017  Check In: Session Check In - 02/22/17 1109      Check-In   Location  [facility]    Staff Present  Suzanne Boron, BS, EP, Exercise Physiologist;Debra Wynetta Emery, RN, BSN    Supervising physician immediately available to respond to emergencies  See telemetry face sheet for immediately available MD    Medication changes reported      No    Fall or balance concerns reported     No    Warm-up and Cool-down  Performed as group-led instruction    Resistance Training Performed  Yes    VAD Patient?  No      Pain Assessment   Currently in Pain?  No/denies    Pain Score  0-No pain    Multiple Pain Sites  No       Capillary Blood Glucose: No results found for this or any previous visit (from the past 24 hour(s)).    Social History   Tobacco Use  Smoking Status Former Smoker  . Types: Cigarettes  . Last attempt to quit: 05/01/1991  . Years since quitting: 25.8  Smokeless Tobacco Never Used  Tobacco Comment   Quit 30 years ago.      Goals Met:  Independence with exercise equipment Exercise tolerated well No report of cardiac concerns or symptoms Strength training completed today  Goals Unmet:  Not Applicable  Comments: Check out 1200   Dr. Kate Sable is Medical Director for [facility] and [facility] Re[facility].

## 2017-02-24 ENCOUNTER — Ambulatory Visit (INDEPENDENT_AMBULATORY_CARE_PROVIDER_SITE_OTHER): Payer: Medicare Other | Admitting: *Deleted

## 2017-02-24 ENCOUNTER — Encounter (HOSPITAL_COMMUNITY)
Admission: RE | Admit: 2017-02-24 | Discharge: 2017-02-24 | Disposition: A | Discharge status: Home / Self Care | Payer: Medicare Other | Source: Ambulatory Visit | Attending: Cardiology | Admitting: Cardiology

## 2017-02-24 DIAGNOSIS — Z951 Presence of aortocoronary bypass graft: Secondary | ICD-10-CM

## 2017-02-24 DIAGNOSIS — Z954 Presence of other heart-valve replacement: Secondary | ICD-10-CM

## 2017-02-24 DIAGNOSIS — Z952 Presence of prosthetic heart valve: Secondary | ICD-10-CM | POA: Diagnosis not present

## 2017-02-24 DIAGNOSIS — Z23 Encounter for immunization: Secondary | ICD-10-CM | POA: Diagnosis not present

## 2017-02-24 NOTE — Progress Notes (Signed)
Daily Session Note  Patient Details  Name: DELDRICK LINCH MRN: [id number] Date of Birth: 1945-05-18 Referring Provider:     CARDIAC REHAB PHASE II ORIENTATION from 12/17/2016 in [facility]  Referring Provider  DR. Domenic Polite      Encounter Date: 02/24/2017  Check In: Session Check In - 02/24/17 1115      Check-In   Location  [facility]    Staff Present  Suzanne Boron, BS, EP, Exercise Physiologist;Debra Wynetta Emery, RN, BSN    Supervising physician immediately available to respond to emergencies  See telemetry face sheet for immediately available MD    Medication changes reported      No    Fall or balance concerns reported     No    Warm-up and Cool-down  Performed as group-led instruction    Resistance Training Performed  Yes    VAD Patient?  No      Pain Assessment   Currently in Pain?  No/denies    Pain Score  0-No pain    Multiple Pain Sites  No       Capillary Blood Glucose: No results found for this or any previous visit (from the past 24 hour(s)).    Social History   Tobacco Use  Smoking Status Former Smoker  . Types: Cigarettes  . Last attempt to quit: 05/01/1991  . Years since quitting: 25.8  Smokeless Tobacco Never Used  Tobacco Comment   Quit 30 years ago.      Goals Met:  Independence with exercise equipment Exercise tolerated well No report of cardiac concerns or symptoms Strength training completed today  Goals Unmet:  Not Applicable  Comments: Check out 1200   Dr. Kate Sable is Medical Director for [facility] and [facility] Re[facility].

## 2017-02-26 ENCOUNTER — Encounter (HOSPITAL_COMMUNITY)
Admission: RE | Admit: 2017-02-26 | Discharge: 2017-02-26 | Disposition: A | Discharge status: Home / Self Care | Payer: Medicare Other | Source: Ambulatory Visit | Attending: Cardiology | Admitting: Cardiology

## 2017-02-26 DIAGNOSIS — Z952 Presence of prosthetic heart valve: Secondary | ICD-10-CM | POA: Diagnosis not present

## 2017-02-26 DIAGNOSIS — Z951 Presence of aortocoronary bypass graft: Secondary | ICD-10-CM

## 2017-02-26 DIAGNOSIS — Z954 Presence of other heart-valve replacement: Secondary | ICD-10-CM

## 2017-02-26 NOTE — Progress Notes (Signed)
Daily Session Note  Patient Details  Name: Charles [name] MRN: [id number] Date of Birth: April 02, 1946 Referring Provider:     CARDIAC REHAB PHASE II ORIENTATION from 12/17/2016 in [facility]  Referring Provider  DR. Domenic Polite      Encounter Date: 02/26/2017  Check In: Session Check In - 02/26/17 1103      Check-In   Location  [facility]    Staff Present  Suzanne Boron, BS, EP, Exercise Physiologist;Debra Wynetta Emery, RN, BSN    Supervising physician immediately available to respond to emergencies  See telemetry face sheet for immediately available MD    Medication changes reported      No    Fall or balance concerns reported     No    Warm-up and Cool-down  Performed as group-led instruction    Resistance Training Performed  Yes    VAD Patient?  No      Pain Assessment   Currently in Pain?  No/denies    Pain Score  0-No pain    Multiple Pain Sites  No       Capillary Blood Glucose: No results found for this or any previous visit (from the past 24 hour(s)).    Social History   Tobacco Use  Smoking Status Former Smoker  . Types: Cigarettes  . Last attempt to quit: 05/01/1991  . Years since quitting: 25.8  Smokeless Tobacco Never Used  Tobacco Comment   Quit 30 years ago.      Goals Met:  Independence with exercise equipment Exercise tolerated well No report of cardiac concerns or symptoms Strength training completed today  Goals Unmet:  Not Applicable  Comments: Check out 1200   Dr. Kate Sable is Medical Director for [facility] and [facility] Re[facility].

## 2017-03-01 ENCOUNTER — Encounter (HOSPITAL_COMMUNITY)
Admission: RE | Admit: 2017-03-01 | Discharge: 2017-03-01 | Disposition: A | Discharge status: Home / Self Care | Payer: Medicare Other | Source: Ambulatory Visit | Attending: Cardiology | Admitting: Cardiology

## 2017-03-01 DIAGNOSIS — Z951 Presence of aortocoronary bypass graft: Secondary | ICD-10-CM

## 2017-03-01 DIAGNOSIS — Z952 Presence of prosthetic heart valve: Secondary | ICD-10-CM | POA: Diagnosis not present

## 2017-03-01 DIAGNOSIS — Z954 Presence of other heart-valve replacement: Secondary | ICD-10-CM

## 2017-03-01 NOTE — Progress Notes (Signed)
Daily Session Note  Patient Details  Name: Charles [name] MRN: [id number] Date of Birth: Aug 11, 1945 Referring Provider:     CARDIAC REHAB PHASE II ORIENTATION from 12/17/2016 in [facility]  Referring Provider  DR. Domenic Polite      Encounter Date: 03/01/2017  Check In: Session Check In - 03/01/17 1111      Check-In   Location  [facility]    Staff Present  Suzanne Boron, BS, EP, Exercise Physiologist;Debra Wynetta Emery, RN, BSN    Supervising physician immediately available to respond to emergencies  See telemetry face sheet for immediately available MD    Medication changes reported      No    Fall or balance concerns reported     No    Warm-up and Cool-down  Performed as group-led instruction    Resistance Training Performed  Yes    VAD Patient?  No      Pain Assessment   Currently in Pain?  No/denies    Pain Score  0-No pain    Multiple Pain Sites  No       Capillary Blood Glucose: No results found for this or any previous visit (from the past 24 hour(s)).    Social History   Tobacco Use  Smoking Status Former Smoker  . Types: Cigarettes  . Last attempt to quit: 05/01/1991  . Years since quitting: 25.8  Smokeless Tobacco Never Used  Tobacco Comment   Quit 30 years ago.      Goals Met:  Independence with exercise equipment Exercise tolerated well No report of cardiac concerns or symptoms Strength training completed today  Goals Unmet:  Not Applicable  Comments: Check out 1200   Dr. Kate Sable is Medical Director for [facility] and [facility] Re[facility].

## 2017-03-03 ENCOUNTER — Encounter (HOSPITAL_COMMUNITY)
Admission: RE | Admit: 2017-03-03 | Discharge: 2017-03-03 | Disposition: A | Discharge status: Home / Self Care | Payer: Medicare Other | Source: Ambulatory Visit | Attending: Cardiology | Admitting: Cardiology

## 2017-03-03 DIAGNOSIS — Z951 Presence of aortocoronary bypass graft: Secondary | ICD-10-CM

## 2017-03-03 DIAGNOSIS — Z952 Presence of prosthetic heart valve: Secondary | ICD-10-CM | POA: Diagnosis not present

## 2017-03-03 DIAGNOSIS — Z954 Presence of other heart-valve replacement: Secondary | ICD-10-CM

## 2017-03-03 NOTE — Progress Notes (Signed)
Daily Session Note  Patient Details  Name: Charles [name] MRN: [id number] Date of Birth: November 29, 1945 Referring Provider:     CARDIAC REHAB PHASE II ORIENTATION from 12/17/2016 in [facility]  Referring Provider  DR. Domenic Polite      Encounter Date: 03/03/2017  Check In: Session Check In - 03/03/17 1110      Check-In   Location  [facility]    Staff Present  Diane Coad, MS, EP, [facility], Exercise Physiologist;Anish Vana Luther Parody, BS, EP, Exercise Physiologist;Debra Wynetta Emery, RN, BSN    Supervising physician immediately available to respond to emergencies  See telemetry face sheet for immediately available MD    Medication changes reported      No    Fall or balance concerns reported     No    Warm-up and Cool-down  Performed as group-led instruction    Resistance Training Performed  Yes    VAD Patient?  No      Pain Assessment   Currently in Pain?  No/denies    Pain Score  0-No pain    Multiple Pain Sites  No       Capillary Blood Glucose: No results found for this or any previous visit (from the past 24 hour(s)).    Social History   Tobacco Use  Smoking Status Former Smoker  . Types: Cigarettes  . Last attempt to quit: 05/01/1991  . Years since quitting: 25.8  Smokeless Tobacco Never Used  Tobacco Comment   Quit 30 years ago.      Goals Met:  Independence with exercise equipment Exercise tolerated well No report of cardiac concerns or symptoms Strength training completed today  Goals Unmet:  Not Applicable  Comments: Check out 1200   Dr. Kate Sable is Medical Director for [facility] and [facility].

## 2017-03-05 ENCOUNTER — Encounter (HOSPITAL_COMMUNITY): Payer: Medicare Other

## 2017-03-08 ENCOUNTER — Encounter (HOSPITAL_COMMUNITY)
Admission: RE | Admit: 2017-03-08 | Discharge: 2017-03-08 | Disposition: A | Discharge status: Home / Self Care | Payer: Medicare Other | Source: Ambulatory Visit | Attending: Cardiology | Admitting: Cardiology

## 2017-03-08 DIAGNOSIS — Z954 Presence of other heart-valve replacement: Secondary | ICD-10-CM

## 2017-03-08 DIAGNOSIS — Z952 Presence of prosthetic heart valve: Secondary | ICD-10-CM | POA: Diagnosis not present

## 2017-03-08 DIAGNOSIS — Z951 Presence of aortocoronary bypass graft: Secondary | ICD-10-CM

## 2017-03-08 NOTE — Progress Notes (Signed)
Cardiac Individual Treatment Plan  Patient Details  Name: Charles [name] MRN: [id number] Date of Birth: 24-Jan-1946 Referring Provider:     CARDIAC REHAB PHASE II ORIENTATION from 12/17/2016 in [facility]  Referring Provider  DR. McDowell      Initial Encounter Date:    CARDIAC REHAB PHASE II ORIENTATION from 12/17/2016 in [facility]  Date  12/17/16  Referring Provider  DR. McDowell      Visit Diagnosis: S/P aortic valve replacement with allograft  S/P CABG x 1  Patient's Home Medications on Admission:  Current Outpatient Medications:  .  aspirin EC 81 MG tablet, Take 81 mg by mouth daily., Disp: , Rfl:  .  atorvastatin (LIPITOR) 20 MG tablet, Take 1 tablet (20 mg total) by mouth daily., Disp: 90 tablet, Rfl: 1 .  furosemide (LASIX) 20 MG tablet, Take 20 mg by mouth daily., Disp: , Rfl:  .  IRON PO, Take 1 tablet by mouth daily. OTC iron, Disp: , Rfl:  .  metoprolol tartrate (LOPRESSOR) 25 MG tablet, Take 1 tablet (25 mg total) by mouth 2 (two) times daily. Hold for HR less than 50, Disp: 180 tablet, Rfl: 1 .  Multiple Vitamin (MULTIVITAMIN WITH MINERALS) TABS tablet, Take 1 tablet by mouth daily., Disp: , Rfl:  .  potassium chloride SA (K-DUR,KLOR-CON) 20 MEQ tablet, Take 1 tablet (20 mEq total) by mouth daily., Disp: 90 tablet, Rfl: 1  Past Medical History: Past Medical History:  Diagnosis Date  . Anemia   . Aortic insufficiency    a. 07/07/2016: TEE showing bicuspid aortic valve with severe eccentric AI   . Ascending aortic aneurysm (HCC)    4.4 cm by CT 06/2016  . 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 Bicuspid aortic valve   . Chronic periodontitis   . Coronary artery disease   . Pneumonia 1978  . 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 S/P patent foramen ovale closure 10/02/2016    Tobacco Use: Social History   Tobacco Use  Smoking Status Former Smoker  . Types: Cigarettes  . Last attempt to quit: 05/01/1991  . Years since quitting: 25.8  Smokeless Tobacco Never Used  Tobacco Comment   Quit 30 years ago.      Labs: Recent Review Flowsheet Data    Labs for ITP Cardiac and Pulmonary Rehab Latest Ref Rng & Units 10/06/2016 10/07/2016 10/07/2016 10/07/2016 10/08/2016   Cholestrol 0 - 200 mg/dL - - - - -   LDLCALC 0 - 99 mg/dL - - - - -   HDL >40 mg/dL - - - - -   Trlycerides <150 mg/dL - - - - -   Hemoglobin A1c 4.8 - 5.6 % - - - - -   PHART 7.350 - 7.450 7.484(H) 7.499(H) - - -   PCO2ART 32.0 - 48.0 mmHg 41.6 44.4 - - -   HCO3 20.0 - 28.0 mmol/L 31.4(H) 34.6(H) - - -   TCO2 0 - 100 mmol/L 33 36 - - -   ACIDBASEDEF 0.0 - 2.0 mmol/L - - - - -   O2SAT % 92.0 98.0 65.1 95.4 65.4  Capillary Blood Glucose: Lab Results  Component Value Date   GLUCAP 147 (H) 10/05/2016   GLUCAP 97 10/05/2016   GLUCAP 110 (H) 10/05/2016   GLUCAP 90 10/05/2016   GLUCAP 98 10/05/2016     Exercise Target Goals:    Exercise Program Goal: Individual exercise prescription set with THRR, safety & activity barriers. Participant demonstrates ability to understand and report RPE using BORG scale, to self-measure pulse accurately, and to acknowledge the importance of the exercise prescription.  Exercise Prescription Goal: Starting with aerobic activity 30 plus minutes a day, 3 days per week for initial exercise prescription. Provide home exercise prescription and guidelines that participant acknowledges understanding prior to discharge.  Activity Barriers & Risk Stratification: Activity Barriers & Cardiac Risk Stratification - 12/17/16 0957      Activity Barriers & Cardiac Risk Stratification   Activity  Barriers  Deconditioning;Incisional Pain    Cardiac Risk Stratification  High       6 Minute Walk: 6 Minute Walk    Row Name 12/17/16 0956         6 Minute Walk   Phase  Initial     Distance  1200 feet     Distance % Change  0 %     Distance Feet Change  0 ft     Walk Time  6 minutes     # of Rest Breaks  0     MPH  2.27     METS  2.74     RPE  9     Perceived Dyspnea   7     VO2 Peak  8.77     Symptoms  No     Resting HR  61 bpm     Resting BP  100/56     Resting Oxygen Saturation   95 %     Exercise Oxygen Saturation  during 6 min walk  91 %     Max Ex. HR  96 bpm     Max Ex. BP  124/70     2 Minute Post BP  100/58        Oxygen Initial Assessment:   Oxygen Re-Evaluation:   Oxygen Discharge (Final Oxygen Re-Evaluation):   Initial Exercise Prescription: Initial Exercise Prescription - 12/17/16 0800      Date of Initial Exercise RX and Referring Provider   Date  12/17/16    Referring Provider  DR. McDowell      Treadmill   MPH  1.5    Grade  0    Minutes  15    METs  2.2      Recumbant Elliptical   Level  1    RPM  40    Watts  41    Minutes  20    METs  2.4      Prescription Details   Frequency (times per week)  3    Duration  Progress to 30 minutes of continuous aerobic without signs/symptoms of physical distress      Intensity   THRR 40-80% of Max Heartrate  [phone]    Ratings of Perceived Exertion  11-13    Perceived Dyspnea  0-4      Progression   Progression  Continue progressive overload as per policy without signs/symptoms or physical distress.      Resistance Training   Training Prescription  Yes    Weight  1    Reps  10-15       Perform Capillary Blood Glucose  checks as needed.  Exercise Prescription Changes:  Exercise Prescription Changes    Row Name 12/24/16 1400 12/28/16 1400 01/12/17 0700 01/25/17 1400 02/04/17 0800     Response to Exercise   Blood Pressure (Admit)  -  136/80  124/70  124/74  136/76   Blood  Pressure (Exercise)  -  144/82  144/70  160/76  144/72   Blood Pressure (Exit)  -  126/76  124/66  120/70  134/72   Heart Rate (Admit)  -  68 bpm  63 bpm  47 bpm  81 bpm   Heart Rate (Exercise)  -  70 bpm  74 bpm  77 bpm  81 bpm   Heart Rate (Exit)  -  70 bpm  72 bpm  57 bpm  67 bpm   Rating of Perceived Exertion (Exercise)  -  10  11  11  12    Duration  -  Progress to 30 minutes of  aerobic without signs/symptoms of physical distress  Progress to 30 minutes of  aerobic without signs/symptoms of physical distress  Progress to 30 minutes of  aerobic without signs/symptoms of physical distress  Progress to 30 minutes of  aerobic without signs/symptoms of physical distress   Intensity  -  THRR New [id number]  THRR New 98-116-133  THRR New 89-109-130  THRR New 109-123-137     Progression   Progression  -  Continue to progress workloads to maintain intensity without signs/symptoms of physical distress.  Continue to progress workloads to maintain intensity without signs/symptoms of physical distress.  Continue to progress workloads to maintain intensity without signs/symptoms of physical distress.  Continue to progress workloads to maintain intensity without signs/symptoms of physical distress.     Resistance Training   Training Prescription  Yes  Yes  Yes  Yes  Yes   Weight  1  1  2  3  3    Reps  10-15  10-15  10-15  10-15  10-15     Treadmill   MPH  1.5  1.7  2  2.1  2.3   Grade  0  0  0  0  0   Minutes  15  20  20  20  20    METs  2.2  2.3  2.5  2.6  2.7     NuStep   Level  -  2  3  3  3    SPM  -  77  89  103  100   Minutes  -  15  15  15  15    METs  -  1.9  2.4  2.8  2.9     Recumbant Elliptical   Level  1  -  -  -  -   RPM  40  -  -  -  -   Watts  41  -  -  -  -   Minutes  20  -  -  -  -   METs  2.4  -  -  -  -     Home Exercise Plan   Plans to continue exercise at  Home (comment)  Home (comment)  Home (comment)  Home (comment)  Home (comment)   Frequency  Add 2 additional days  to program exercise sessions.  Add 2 additional days to program exercise sessions.  Add 2 additional days to program exercise sessions.  Add 2 additional days to program exercise sessions.  Add 2 additional days to program  exercise sessions.   Initial Home Exercises Provided  12/21/16  12/21/16  12/21/16  12/21/16  12/21/16   Row Name 02/23/17 0700 03/08/17 1000           Response to Exercise   Blood Pressure (Admit)  132/72  144/78      Blood Pressure (Exercise)  140/80  148/82      Blood Pressure (Exit)  118/66  136/80      Heart Rate (Admit)  69 bpm  77 bpm      Heart Rate (Exercise)  120 bpm  71 bpm      Heart Rate (Exit)  78 bpm  79 bpm      Rating of Perceived Exertion (Exercise)  12  12      Duration  Progress to 30 minutes of  aerobic without signs/symptoms of physical distress  Progress to 30 minutes of  aerobic without signs/symptoms of physical distress      Intensity  THRR New 102-118-135  THRR New 107-121-136        Progression   Progression  Continue to progress workloads to maintain intensity without signs/symptoms of physical distress.  Continue to progress workloads to maintain intensity without signs/symptoms of physical distress.        Resistance Training   Training Prescription  Yes  Yes      Weight  4  4      Reps  10-15  10-15        Treadmill   MPH  2.7  2.7      Grade  0  0      Minutes  20  20      METs  3  3        NuStep   Level  4  4      SPM  94  96      Minutes  15  15      METs  3.1  3        Home Exercise Plan   Plans to continue exercise at  Home (comment)  Home (comment)      Frequency  Add 2 additional days to program exercise sessions.  Add 2 additional days to program exercise sessions.      Initial Home Exercises Provided  12/21/16  12/21/16         Exercise Comments:  Exercise Comments    Row Name 12/24/16 1433 12/28/16 1444 01/12/17 0750 01/25/17 1421 02/04/17 0803   Exercise Comments  Patient was given his take home exercise  plan. He expressed an understanding of the plan. We addressed his THR and safe ways to stay active outside of CR. He stated that he would soon do mild exercises at home. He was encouraged to come to me if he had any further questions.   Patient is doing well in the program. He was taken off the recumbent elliptical due to back pain.   Patient is doing well in CR. Patient stated that he currently walks for thirty minutes at a time when he is not at CR.   Patient is doing well in CR.  Patient is doing very well in CR. He continues to work out 3 days a week when he is not in CR.   Row Name 03/08/17 1007           Exercise Comments  Patient is doing well in CR. He admits to being active outside of class. He is maintaining his levels and  watts on both machines and he states that this program is helping him          Exercise Goals and Review:  Exercise Goals    Row Name 12/17/16 0958             Exercise Goals   Increase Physical Activity  Yes       Intervention  Provide advice, education, support and counseling about physical activity/exercise needs.;Develop an individualized exercise prescription for aerobic and resistive training based on initial evaluation findings, risk stratification, comorbidities and participant's personal goals.       Expected Outcomes  Achievement of increased cardiorespiratory fitness and enhanced flexibility, muscular endurance and strength shown through measurements of functional capacity and personal statement of participant.       Increase Strength and Stamina  Yes       Intervention  Provide advice, education, support and counseling about physical activity/exercise needs.;Develop an individualized exercise prescription for aerobic and resistive training based on initial evaluation findings, risk stratification, comorbidities and participant's personal goals.       Expected Outcomes  Achievement of increased cardiorespiratory fitness and enhanced flexibility,  muscular endurance and strength shown through measurements of functional capacity and personal statement of participant.       Able to understand and use rate of perceived exertion (RPE) scale  Yes       Intervention  Provide education and explanation on how to use RPE scale       Expected Outcomes  Short Term: Able to use RPE daily in rehab to express subjective intensity level;Long Term:  Able to use RPE to guide intensity level when exercising independently       Able to understand and use Dyspnea scale  Yes       Intervention  Provide education and explanation on how to use Dyspnea scale       Expected Outcomes  Short Term: Able to use Dyspnea scale daily in rehab to express subjective sense of shortness of breath during exertion;Long Term: Able to use Dyspnea scale to guide intensity level when exercising independently       Knowledge and understanding of Target Heart Rate Range (THRR)  Yes       Intervention  Provide education and explanation of THRR including how the numbers were predicted and where they are located for reference       Expected Outcomes  Short Term: Able to state/look up THRR       Able to check pulse independently  Yes       Intervention  Provide education and demonstration on how to check pulse in carotid and radial arteries.;Review the importance of being able to check your own pulse for safety during independent exercise       Expected Outcomes  Short Term: Able to explain why pulse checking is important during independent exercise;Long Term: Able to check pulse independently and accurately       Understanding of Exercise Prescription  Yes       Intervention  Provide education, explanation, and written materials on patient's individual exercise prescription       Expected Outcomes  Short Term: Able to explain program exercise prescription;Long Term: Able to explain home exercise prescription to exercise independently          Exercise Goals Re-Evaluation : Exercise  Goals Re-Evaluation    Row Name 12/28/16 1444 01/12/17 0748 02/04/17 0802 03/08/17 1005       Exercise Goal Re-Evaluation   Exercise  Goals Review  Increase Physical Activity;Increase Strength and Stamina;Knowledge and understanding of Target Heart Rate Range (THRR)  Increase Physical Activity;Increase Strength and Stamina;Knowledge and understanding of Target Heart Rate Range (THRR)  Increase Physical Activity;Increase Strength and Stamina;Knowledge and understanding of Target Heart Rate Range (THRR)  Increase Strength and Stamina;Increase Physical Activity;Knowledge and understanding of Target Heart Rate Range (THRR)    Comments  Patient is doing well in CR.   Patient is doing well in CR. Patient stated that he currently walks for thirty minutes at a time when he is not at CR.   Patient is doing very well in CR. He continues to work out 3 days a week when he is not in CR.  Patient is doing well in CR. He admits to being active outside of class. He is maintaining his levels and watts on both machines and he states that this program is helping him.     Expected Outcomes  Patient is trying to get back into better shape and lose weight through program.   Patient wants to gain full mobility and to get back to driving   Goals are to get full mobility and back to driving   Patient wishes for full mobility and to get back to driving.         Discharge Exercise Prescription (Final Exercise Prescription Changes): Exercise Prescription Changes - 03/08/17 1000      Response to Exercise   Blood Pressure (Admit)  144/78    Blood Pressure (Exercise)  148/82    Blood Pressure (Exit)  136/80    Heart Rate (Admit)  77 bpm    Heart Rate (Exercise)  71 bpm    Heart Rate (Exit)  79 bpm    Rating of Perceived Exertion (Exercise)  12    Duration  Progress to 30 minutes of  aerobic without signs/symptoms of physical distress    Intensity  THRR New 107-121-136      Progression   Progression  Continue to progress  workloads to maintain intensity without signs/symptoms of physical distress.      Resistance Training   Training Prescription  Yes    Weight  4    Reps  10-15      Treadmill   MPH  2.7    Grade  0    Minutes  20    METs  3      NuStep   Level  4    SPM  96    Minutes  15    METs  3      Home Exercise Plan   Plans to continue exercise at  Home (comment)    Frequency  Add 2 additional days to program exercise sessions.    Initial Home Exercises Provided  12/21/16       Nutrition:  Target Goals: Understanding of nutrition guidelines, daily intake of sodium 1500mg , cholesterol 200mg , calories 30% from fat and 7% or less from saturated fats, daily to have 5 or more servings of fruits and vegetables.  Biometrics: Pre Biometrics - 12/17/16 0841      Pre Biometrics   Height  6' (1.829 m)    Weight  231 lb 14.8 oz (105.2 kg)    Waist Circumference  44 inches    Hip Circumference  46.5 inches    Waist to Hip Ratio  0.95 %    BMI (Calculated)  31.45    Triceps Skinfold  16 mm    % Body Fat  30.7 %    Grip Strength  83.2 kg    Flexibility  14 in    Single Leg Stand  9 seconds        Nutrition Therapy Plan and Nutrition Goals: Nutrition Therapy & Goals - 01/18/17 0813      Personal Nutrition Goals   Nutrition Goal  For heart healthy choices add >50% of whole grains, make half their plate fruits and vegetables. Discuss the difference between starchy vegetables and leafy greens, and how leafy vegetables provide fiber, helps maintain healthy weight, helps control blood glucose, and lowers cholesterol.  Discuss purchasing fresh or frozen vegetable to reduce sodium and not to add grease, fat or sugar. Consume <18oz of red meat per week. Consume lean cuts of meats and very little of meats high in sodium and nitrates such as pork and lunch meats. Discussed portion control for all food groups      Intervention Plan   Intervention  Nutrition handout(s) given to patient.     Expected Outcomes  Short Term Goal: Understand basic principles of dietary content, such as calories, fat, sodium, cholesterol and nutrients.       Nutrition Discharge: Rate Your Plate Scores:   Nutrition Goals Re-Evaluation: Nutrition Goals Re-Evaluation    Row Name 02/10/17 1325 03/08/17 1419           Goals   Current Weight  234 lb 9.6 oz (106.4 kg)  238 lb 1.6 oz (108 kg)      Nutrition Goal  For heart healthy choices add >50% of whole grains, make half their plate fruits and vegetables. Discuss the difference between starchy vegetables and leafy greens, and how leafy vegetables provide fiber, helps maintain healthy weight, helps control blood glucose, and lowers cholesterol.  Discuss purchasing fresh or frozen vegetable to reduce sodium and not to add grease, fat or sugar. Consume <18oz of red meat per week. Consume lean cuts of meats and very little of meats high in sodium and nitrates such as pork and lunch meats. Discussed portion control for all food groups.    For heart healthy choices add >50% of whole grains, make half their plate fruits and vegetables. Discuss the difference between starchy vegetables and leafy greens, and how leafy vegetables provide fiber, helps maintain healthy weight, helps control blood glucose, and lowers cholesterol.  Discuss purchasing fresh or frozen vegetable to reduce sodium and not to add grease, fat or sugar. Consume <18oz of red meat per week. Consume lean cuts of meats and very little of meats high in sodium and nitrates such as pork and lunch meats. Discussed portion control for all food groups.        Comment  Patient has lost 3 lbs since last 30 Day ITP review. He says he has stopped eating ice cream at night and is trying to eat smaller portions and more vegetables and fruit. Will continue to monitor.   Patient says he is trying to meet his nutrition goal. He continues to eat ice cream which he blames on his inability to lose weight.       Expected  Outcome  Patient will continue to work toward meeting his nutrition goals.   Patient will continue to meet his nutrition goals.          Nutrition Goals Discharge (Final Nutrition Goals Re-Evaluation): Nutrition Goals Re-Evaluation - 03/08/17 1419      Goals   Current Weight  238 lb 1.6 oz (108 kg)    Nutrition Goal  For heart healthy choices add >50% of whole grains, make half their plate fruits and vegetables. Discuss the difference between starchy vegetables and leafy greens, and how leafy vegetables provide fiber, helps maintain healthy weight, helps control blood glucose, and lowers cholesterol.  Discuss purchasing fresh or frozen vegetable to reduce sodium and not to add grease, fat or sugar. Consume <18oz of red meat per week. Consume lean cuts of meats and very little of meats high in sodium and nitrates such as pork and lunch meats. Discussed portion control for all food groups.      Comment  Patient says he is trying to meet his nutrition goal. He continues to eat ice cream which he blames on his inability to lose weight.     Expected Outcome  Patient will continue to meet his nutrition goals.        Psychosocial: Target Goals: Acknowledge presence or absence of significant depression and/or stress, maximize coping skills, provide positive support system. Participant is able to verbalize types and ability to use techniques and skills needed for reducing stress and depression.  Initial Review & Psychosocial Screening: Initial Psych Review & Screening - 12/17/16 1134      Initial Review   Current issues with  None Identified      Family Dynamics   Good Support System?  Yes      Barriers   Psychosocial barriers to participate in program  There are no identifiable barriers or psychosocial needs.      Screening Interventions   Interventions  Encouraged to exercise       Quality of Life Scores: Quality of Life - 12/17/16 0842      Quality of Life Scores   Health/Function  Pre  17.77 %    Socioeconomic Pre  16.36 %    Psych/Spiritual Pre  20.79 %    Family Pre  22.2 %    GLOBAL Pre  18.75 %       PHQ-9: Recent Review Flowsheet Data    Depression screen [facility] 2/9 12/17/2016 09/08/2016 07/28/2016   Decreased Interest - 0 0   Down, Depressed, Hopeless - 0 0   PHQ - 2 Score - 0 0   Altered sleeping 0 - -   Tired, decreased energy 0 - -   Change in appetite 0 - -   Feeling bad or failure about yourself  1 - -   Trouble concentrating 0 - -   Moving slowly or fidgety/restless 0 - -   Suicidal thoughts 0 - -   Difficult doing work/chores Somewhat difficult - -     Interpretation of Total Score  Total Score Depression Severity:  1-4 = Minimal depression, 5-9 = Mild depression, 10-14 = Moderate depression, 15-19 = Moderately severe depression, 20-27 = Severe depression   Psychosocial Evaluation and Intervention: Psychosocial Evaluation - 12/17/16 1146      Psychosocial Evaluation & Interventions   Interventions  Encouraged to exercise with the program and follow exercise prescription    Continue Psychosocial Services   No Follow up required       Psychosocial Re-Evaluation: Psychosocial Re-Evaluation    Row Name 01/13/17 1301 02/10/17 1331 03/08/17 1418         Psychosocial Re-Evaluation   Current issues with  None Identified  None Identified  None Identified     Expected Outcomes  Patient will have no psychosocial issues identified at discharge.   Patient will have no psychosocial issues identified at discharge.   Patient  will have no psychosocial issues identified at discharge.      Interventions  Encouraged to attend Cardiac Rehabilitation for the exercise  Encouraged to attend Cardiac Rehabilitation for the exercise;Stress management education;Relaxation education  Encouraged to attend Cardiac Rehabilitation for the exercise;Stress management education;Relaxation education     Continue Psychosocial Services   No Follow up required  No Follow up  required  No Follow up required        Psychosocial Discharge (Final Psychosocial Re-Evaluation): Psychosocial Re-Evaluation - 03/08/17 1418      Psychosocial Re-Evaluation   Current issues with  None Identified    Expected Outcomes  Patient will have no psychosocial issues identified at discharge.     Interventions  Encouraged to attend Cardiac Rehabilitation for the exercise;Stress management education;Relaxation education    Continue Psychosocial Services   No Follow up required       Vocational Rehabilitation: Provide vocational rehab assistance to qualifying candidates.   Vocational Rehab Evaluation & Intervention: Vocational Rehab - 12/17/16 1130      Initial Vocational Rehab Evaluation & Intervention   Assessment shows need for Vocational Rehabilitation  No       Education: Education Goals: Education classes will be provided on a weekly basis, covering required topics. Participant will state understanding/return demonstration of topics presented.  Learning Barriers/Preferences: Learning Barriers/Preferences - 12/17/16 1128      Learning Barriers/Preferences   Learning Barriers  None    Learning Preferences  Individual Instruction;Group Instruction;Skilled Demonstration       Education Topics: Hypertension, Hypertension Reduction -Define heart disease and high blood pressure. Discus how high blood pressure affects the body and ways to reduce high blood pressure.   CARDIAC REHAB PHASE II EXERCISE from 03/03/2017 in [place]  Date  02/17/17  Educator  DJ  Instruction Review Code  2- Demonstrated Understanding      Exercise and Your Heart -Discuss why it is important to exercise, the FITT principles of exercise, normal and abnormal responses to exercise, and how to exercise safely.   CARDIAC REHAB PHASE II EXERCISE from 03/03/2017 in [place]  Date  02/24/17  Educator  DJ  Instruction Review Code  2-  Demonstrated Understanding      Angina -Discuss definition of angina, causes of angina, treatment of angina, and how to decrease risk of having angina.   CARDIAC REHAB PHASE II EXERCISE from 03/03/2017 in [place]  Date  03/03/17  Educator  D. Coad  Instruction Review Code  2- Demonstrated Understanding      Cardiac Medications -Review what the following cardiac medications are used for, how they affect the body, and side effects that may occur when taking the medications.  Medications include Aspirin, Beta blockers, calcium channel blockers, ACE Inhibitors, angiotensin receptor blockers, diuretics, digoxin, and antihyperlipidemics.   Congestive Heart Failure -Discuss the definition of CHF, how to live with CHF, the signs and symptoms of CHF, and how keep track of weight and sodium intake.   Heart Disease and Intimacy -Discus the effect sexual activity has on the heart, how changes occur during intimacy as we age, and safety during sexual activity.   Smoking Cessation / COPD -Discuss different methods to quit smoking, the health benefits of quitting smoking, and the definition of COPD.   CARDIAC REHAB PHASE II EXERCISE from 03/03/2017 in [place]  Date  12/23/16  Educator  DJ  Instruction Review Code  2- Demonstrated Understanding  Nutrition I: Fats -Discuss the types of cholesterol, what cholesterol does to the heart, and how cholesterol levels can be controlled.   CARDIAC REHAB PHASE II EXERCISE from 03/03/2017 in [place]  Date  12/30/16  Educator  DC  Instruction Review Code  2- Demonstrated Understanding      Nutrition II: Labels -Discuss the different components of food labels and how to read food label   CARDIAC REHAB PHASE II EXERCISE from 03/03/2017 in [place]  Date  01/06/17  Educator  Dc  Instruction Review Code  2- Demonstrated Understanding       Heart Parts and Heart Disease -Discuss the anatomy of the heart, the pathway of blood circulation through the heart, and these are affected by heart disease.   CARDIAC REHAB PHASE II EXERCISE from 03/03/2017 in [place]  Date  01/13/17  Educator  DJ  Instruction Review Code  2- Demonstrated Understanding      Stress I: Signs and Symptoms -Discuss the causes of stress, how stress may lead to anxiety and depression, and ways to limit stress.   Stress II: Relaxation -Discuss different types of relaxation techniques to limit stress.   CARDIAC REHAB PHASE II EXERCISE from 03/03/2017 in [place]  Date  01/27/17  Educator  DJ  Instruction Review Code  2- Demonstrated Understanding      Warning Signs of Stroke / TIA -Discuss definition of a stroke, what the signs and symptoms are of a stroke, and how to identify when someone is having stroke.   CARDIAC REHAB PHASE II EXERCISE from 03/03/2017 in [place]  Date  02/03/17  Educator  Center  Instruction Review Code  2- Demonstrated Understanding      Knowledge Questionnaire Score: Knowledge Questionnaire Score - 12/17/16 1130      Knowledge Questionnaire Score   Pre Score  21/24       Core Components/Risk Factors/Patient Goals at Admission: Personal Goals and Risk Factors at Admission - 12/17/16 1132      Core Components/Risk Factors/Patient Goals on Admission    Weight Management  Weight Maintenance    Improve shortness of breath with ADL's  Yes    Intervention  Provide education, individualized exercise plan and daily activity instruction to help decrease symptoms of SOB with activities of daily living.    Expected Outcomes  Short Term: Achieves a reduction of symptoms when performing activities of daily living.    Personal Goal Other  Yes    Personal Goal  Full mobility, get back to driving    Intervention  Attend CR 3 x week and supplement with 2  days of home exercise.     Expected Outcomes  Reach goals.        Core Components/Risk Factors/Patient Goals Review:  Goals and Risk Factor Review    Row Name 01/13/17 1256 02/10/17 1328 03/08/17 1411         Core Components/Risk Factors/Patient Goals Review   Personal Goals Review  Weight Management/Obesity;Improve shortness of breath with ADL's Full mobility and drive again.   Weight Management/Obesity;Improve shortness of breath with ADL's Full mobility; back to driving.  Weight Management/Obesity;Improve shortness of breath with ADL's Full mobility; drive again.     Review  Patient has completed 11 sessions gaining 5.4 lbs since he started the program. Patient was asked about his weight gain. He says he makes his own ice cream and he has been eating it every night.  He attributes this to his gain. He has not attended the nutrition class but plans to attend the next class. He says he weighs himself everyday and is to call MD for a weight gain over 3 lbs/day or 5 lbs/wk. He says he does feel stronger and feels like his mobility is improving but has not started driving yet. His MD will release him to drive in December. Will continue to monitor.   Patient has completed 22 sessions. He continue to do well in the program. His life vest was removed 2 weeks ago. Patient says he will be able to drive 71/69/67. He says his mobility is getting better and he has more energy. He is walking on the days he is not in CR. He says he feels better overall since he started the program. Will continue to monitor.   Patient has completed 31 sessions gaining 4 lbs since last ITP. He continues to do well in the program progressing on the treadmill and nu-step. He says he feels much better and a lot stronger. He is walking on the days he is not in CR. He says has more energy to do things around the house and feels a lot stronger. Will continue to monitor.      Expected Outcomes  Patient will complete the program and continue  to work toward meeting his personal goals.   Patient will complete the program and continue to meet his personal goals.   Patient will complete the program and continue to meet his personal goals.         Core Components/Risk Factors/Patient Goals at Discharge (Final Review):  Goals and Risk Factor Review - 03/08/17 1411      Core Components/Risk Factors/Patient Goals Review   Personal Goals Review  Weight Management/Obesity;Improve shortness of breath with ADL's Full mobility; drive again.    Review  Patient has completed 31 sessions gaining 4 lbs since last ITP. He continues to do well in the program progressing on the treadmill and nu-step. He says he feels much better and a lot stronger. He is walking on the days he is not in CR. He says has more energy to do things around the house and feels a lot stronger. Will continue to monitor.     Expected Outcomes  Patient will complete the program and continue to meet his personal goals.        ITP Comments: ITP Comments    Row Name 12/17/16 1523           ITP Comments  Patient new to program. Plans to start Monday 12/21/16.          Comments: ITP 30 Day REVIEW Patient continues to do well in the program. Will continue to monitor for progress.

## 2017-03-08 NOTE — Progress Notes (Signed)
Daily Session Note  Patient Details  Name: Charles [name] MRN: [id number] Date of Birth: 08/20/1945 Referring Provider:     CARDIAC REHAB PHASE II ORIENTATION from 12/17/2016 in [place]  Referring Provider  DR. Domenic Polite      Encounter Date: 03/08/2017  Check In: Session Check In - 03/08/17 1126      Check-In   Location  [facility]    Staff Present  Diane Angelina Pih, MS, EP, [facility], Exercise Physiologist;Chanan Detwiler Luther Parody, BS, EP, Exercise Physiologist;Debra Wynetta Emery, RN, BSN    Supervising physician immediately available to respond to emergencies  See telemetry face sheet for immediately available MD    Medication changes reported      No    Fall or balance concerns reported     No    Warm-up and Cool-down  Performed as group-led instruction    Resistance Training Performed  Yes    VAD Patient?  No      Pain Assessment   Currently in Pain?  No/denies    Pain Score  0-No pain    Multiple Pain Sites  No       Capillary Blood Glucose: No results found for this or any previous visit (from the past 24 hour(s)).  Exercise Prescription Changes - 03/08/17 1000      Response to Exercise   Blood Pressure (Admit)  144/78    Blood Pressure (Exercise)  148/82    Blood Pressure (Exit)  136/80    Heart Rate (Admit)  77 bpm    Heart Rate (Exercise)  71 bpm    Heart Rate (Exit)  79 bpm    Rating of Perceived Exertion (Exercise)  12    Duration  Progress to 30 minutes of  aerobic without signs/symptoms of physical distress    Intensity  THRR New 107-121-136      Progression   Progression  Continue to progress workloads to maintain intensity without signs/symptoms of physical distress.      Resistance Training   Training Prescription  Yes    Weight  4    Reps  10-15      Treadmill   MPH  2.7    Grade  0    Minutes  20    METs  3      NuStep   Level  4    SPM  96    Minutes  15    METs  3      Home Exercise Plan   Plans to continue  exercise at  Home (comment)    Frequency  Add 2 additional days to program exercise sessions.    Initial Home Exercises Provided  12/21/16       Social History   Tobacco Use  Smoking Status Former Smoker  . Types: Cigarettes  . Last attempt to quit: 05/01/1991  . Years since quitting: 25.8  Smokeless Tobacco Never Used  Tobacco Comment   Quit 30 years ago.      Goals Met:  Independence with exercise equipment Exercise tolerated well No report of cardiac concerns or symptoms Strength training completed today  Goals Unmet:  Not Applicable  Comments: Check out 915   Dr. Kate Sable is Medical Director for [place] and Pulmonary Rehab.

## 2017-03-10 ENCOUNTER — Encounter (HOSPITAL_COMMUNITY)
Admission: RE | Admit: 2017-03-10 | Discharge: 2017-03-10 | Disposition: A | Discharge status: Home / Self Care | Payer: Medicare Other | Source: Ambulatory Visit | Attending: Cardiology | Admitting: Cardiology

## 2017-03-10 DIAGNOSIS — Z954 Presence of other heart-valve replacement: Secondary | ICD-10-CM

## 2017-03-10 DIAGNOSIS — Z952 Presence of prosthetic heart valve: Secondary | ICD-10-CM | POA: Diagnosis not present

## 2017-03-10 DIAGNOSIS — Z951 Presence of aortocoronary bypass graft: Secondary | ICD-10-CM

## 2017-03-10 NOTE — Progress Notes (Signed)
Daily Session Note  Patient Details  Name: Charles [name] MRN: [id number] Date of Birth: Jun 11, 1945 Referring Provider:     CARDIAC REHAB PHASE II ORIENTATION from 12/17/2016 in [place]  Referring Provider  DR. McDowell      Encounter Date: 03/10/2017  Check In: Session Check In - 03/10/17 1120      Check-In   Location  [facility]    Staff Present  Suzanne Boron, BS, EP, Exercise Physiologist;Debra Wynetta Emery, RN, BSN    Supervising physician immediately available to respond to emergencies  See telemetry face sheet for immediately available MD    Medication changes reported      No    Fall or balance concerns reported     No    Warm-up and Cool-down  Performed as group-led instruction    Resistance Training Performed  Yes    VAD Patient?  No      Pain Assessment   Currently in Pain?  No/denies    Pain Score  0-No pain    Multiple Pain Sites  No       Capillary Blood Glucose: No results found for this or any previous visit (from the past 24 hour(s)).    Social History   Tobacco Use  Smoking Status Former Smoker  . Types: Cigarettes  . Last attempt to quit: 05/01/1991  . Years since quitting: 25.8  Smokeless Tobacco Never Used  Tobacco Comment   Quit 30 years ago.      Goals Met:  Independence with exercise equipment Exercise tolerated well No report of cardiac concerns or symptoms Strength training completed today  Goals Unmet:  Not Applicable  Comments: Check out 1200   Dr. Kate Sable is Medical Director for [place] and Pulmonary Rehab.

## 2017-03-12 ENCOUNTER — Encounter (HOSPITAL_COMMUNITY)
Admission: RE | Admit: 2017-03-12 | Discharge: 2017-03-12 | Disposition: A | Discharge status: Home / Self Care | Payer: Medicare Other | Source: Ambulatory Visit | Attending: Cardiology | Admitting: Cardiology

## 2017-03-12 DIAGNOSIS — Z951 Presence of aortocoronary bypass graft: Secondary | ICD-10-CM

## 2017-03-12 DIAGNOSIS — Z952 Presence of prosthetic heart valve: Secondary | ICD-10-CM | POA: Diagnosis not present

## 2017-03-12 DIAGNOSIS — Z954 Presence of other heart-valve replacement: Secondary | ICD-10-CM

## 2017-03-12 NOTE — Progress Notes (Signed)
Daily Session Note  Patient Details  Name: Charles [name] MRN: [id number] Date of Birth: [birth year] Referring Provider:     CARDIAC REHAB PHASE II ORIENTATION from 12/17/2016 in [place]  Referring Provider  DR. Domenic Polite      Encounter Date: 03/12/2017  Check In: Session Check In - 03/12/17 1053      Check-In   Location  [facility]    Staff Present  Suzanne Boron, BS, EP, Exercise Physiologist;Debra Wynetta Emery, RN, BSN    Supervising physician immediately available to respond to emergencies  See telemetry face sheet for immediately available MD    Medication changes reported      No    Fall or balance concerns reported     No    Warm-up and Cool-down  Performed as group-led instruction    Resistance Training Performed  Yes    VAD Patient?  No      Pain Assessment   Currently in Pain?  No/denies    Pain Score  0-No pain    Multiple Pain Sites  No       Capillary Blood Glucose: No results found for this or any previous visit (from the past 24 hour(s)).    Social History   Tobacco Use  Smoking Status Former Smoker  . Types: Cigarettes  . Last attempt to quit: 05/01/1991  . Years since quitting: 25.8  Smokeless Tobacco Never Used  Tobacco Comment   Quit 30 years ago.      Goals Met:  Independence with exercise equipment Exercise tolerated well No report of cardiac concerns or symptoms Strength training completed today  Goals Unmet:  Not Applicable  Comments: Check out 1200   Dr. Kate Sable is Medical Director for [place] and Pulmonary Rehab.

## 2017-03-15 ENCOUNTER — Encounter (HOSPITAL_COMMUNITY): Payer: Medicare Other

## 2017-03-16 ENCOUNTER — Telehealth: Payer: Self-pay | Admitting: Cardiology

## 2017-03-16 NOTE — Telephone Encounter (Signed)
Patient states that he is having a " fluttering" in the groin area. He states it is not painful and only last a few seconds. Informed pt to call PCP. Pt voiced understanding.

## 2017-03-16 NOTE — Telephone Encounter (Signed)
Patient would like to speak with nurse about "physical problem" he is having. Would not disclose any other information. / tg

## 2017-03-17 ENCOUNTER — Encounter (HOSPITAL_COMMUNITY)
Admission: RE | Admit: 2017-03-17 | Discharge: 2017-03-17 | Disposition: A | Discharge status: Home / Self Care | Payer: Medicare Other | Source: Ambulatory Visit | Attending: Cardiology | Admitting: Cardiology

## 2017-03-17 DIAGNOSIS — Z954 Presence of other heart-valve replacement: Secondary | ICD-10-CM

## 2017-03-17 DIAGNOSIS — Z952 Presence of prosthetic heart valve: Secondary | ICD-10-CM | POA: Insufficient documentation

## 2017-03-17 DIAGNOSIS — Z951 Presence of aortocoronary bypass graft: Secondary | ICD-10-CM

## 2017-03-17 DIAGNOSIS — Z87891 Personal history of nicotine dependence: Secondary | ICD-10-CM | POA: Diagnosis not present

## 2017-03-17 NOTE — Progress Notes (Signed)
Daily Session Note  Patient Details  Name: Charles [name] MRN: [id number] Date of Birth: 12-31-1945 Referring Provider:     CARDIAC REHAB PHASE II ORIENTATION from 12/17/2016 in [place]  Referring Provider  DR. Domenic Polite      Encounter Date: 03/17/2017  Check In: Session Check In - 03/17/17 1100      Check-In   Location  [facility]    Staff Present  Aundra Dubin, RN, BSN;Gregory Luther Parody, BS, EP, Exercise Physiologist    Supervising physician immediately available to respond to emergencies  See telemetry face sheet for immediately available MD    Medication changes reported      No    Fall or balance concerns reported     No    Warm-up and Cool-down  Performed as group-led instruction    Resistance Training Performed  Yes    VAD Patient?  No      Pain Assessment   Currently in Pain?  No/denies    Pain Score  0-No pain    Multiple Pain Sites  No       Capillary Blood Glucose: No results found for this or any previous visit (from the past 24 hour(s)).    Social History   Tobacco Use  Smoking Status Former Smoker  . Types: Cigarettes  . Last attempt to quit: 05/01/1991  . Years since quitting: 25.8  Smokeless Tobacco Never Used  Tobacco Comment   Quit 30 years ago.      Goals Met:  Independence with exercise equipment Exercise tolerated well No report of cardiac concerns or symptoms Strength training completed today  Goals Unmet:  Not Applicable  Comments: Check out 1200.   Dr. Kate Sable is Medical Director for [facility] and Pulmonary Rehab.

## 2017-03-19 ENCOUNTER — Encounter (HOSPITAL_COMMUNITY)
Admission: RE | Admit: 2017-03-19 | Discharge: 2017-03-19 | Disposition: A | Discharge status: Home / Self Care | Payer: Medicare Other | Source: Ambulatory Visit | Attending: Cardiology | Admitting: Cardiology

## 2017-03-19 VITALS — Ht 72.0 in | Wt 238.6 lb

## 2017-03-19 DIAGNOSIS — Z952 Presence of prosthetic heart valve: Secondary | ICD-10-CM | POA: Diagnosis not present

## 2017-03-19 DIAGNOSIS — Z951 Presence of aortocoronary bypass graft: Secondary | ICD-10-CM

## 2017-03-19 DIAGNOSIS — Z954 Presence of other heart-valve replacement: Secondary | ICD-10-CM

## 2017-03-19 NOTE — Progress Notes (Signed)
Daily Session Note  Patient Details  Name: Charles [name] MRN: [id number] Date of Birth: Dec 26, 1945 Referring Provider:     CARDIAC REHAB PHASE II ORIENTATION from 12/17/2016 in [place]  Referring Provider  DR. Domenic Polite      Encounter Date: 03/19/2017  Check In: Session Check In - 03/19/17 1100      Check-In   Location  [facility]    Staff Present  Britania Shreeve Angelina Pih, MS, EP, [facility], Exercise Physiologist;Debra Wynetta Emery, RN, BSN    Supervising physician immediately available to respond to emergencies  See telemetry face sheet for immediately available MD    Medication changes reported      No    Fall or balance concerns reported     No    Tobacco Cessation  No Change    Warm-up and Cool-down  Performed as group-led instruction    Resistance Training Performed  Yes    VAD Patient?  No      Pain Assessment   Currently in Pain?  No/denies    Pain Score  0-No pain    Multiple Pain Sites  No       Capillary Blood Glucose: No results found for this or any previous visit (from the past 24 hour(s)).    Social History   Tobacco Use  Smoking Status Former Smoker  . Types: Cigarettes  . Last attempt to quit: 05/01/1991  . Years since quitting: 25.9  Smokeless Tobacco Never Used  Tobacco Comment   Quit 30 years ago.      Goals Met:  Independence with exercise equipment Exercise tolerated well No report of cardiac concerns or symptoms Strength training completed today  Goals Unmet:  Not Applicable  Comments: Check out: 1200   Dr. Kate Sable is Medical Director for [place] and Pulmonary Rehab.

## 2017-03-22 ENCOUNTER — Encounter (HOSPITAL_COMMUNITY): Payer: Medicare Other

## 2017-03-24 ENCOUNTER — Encounter (HOSPITAL_COMMUNITY): Payer: Medicare Other

## 2017-03-26 ENCOUNTER — Encounter (HOSPITAL_COMMUNITY): Payer: Medicare Other

## 2017-03-31 NOTE — Progress Notes (Signed)
Cardiac Individual Treatment Plan  Patient Details  Name: Charles [name] MRN: [id number] Date of Birth: 1945/12/03 Referring Provider:     CARDIAC REHAB PHASE II ORIENTATION from 12/17/2016 in [facility]  Referring Provider  DR. McDowell      Initial Encounter Date:    CARDIAC REHAB PHASE II ORIENTATION from 12/17/2016 in [facility]  Date  12/17/16  Referring Provider  DR. McDowell      Visit Diagnosis: S/P aortic valve replacement with allograft  S/P CABG x 1  Patient's Home Medications on Admission:  Current Outpatient Medications:  .  aspirin EC 81 MG tablet, Take 81 mg by mouth daily., Disp: , Rfl:  .  atorvastatin (LIPITOR) 20 MG tablet, Take 1 tablet (20 mg total) by mouth daily., Disp: 90 tablet, Rfl: 1 .  furosemide (LASIX) 20 MG tablet, Take 20 mg by mouth daily., Disp: , Rfl:  .  IRON PO, Take 1 tablet by mouth daily. OTC iron, Disp: , Rfl:  .  metoprolol tartrate (LOPRESSOR) 25 MG tablet, Take 1 tablet (25 mg total) by mouth 2 (two) times daily. Hold for HR less than 50, Disp: 180 tablet, Rfl: 1 .  Multiple Vitamin (MULTIVITAMIN WITH MINERALS) TABS tablet, Take 1 tablet by mouth daily., Disp: , Rfl:  .  potassium chloride SA (K-DUR,KLOR-CON) 20 MEQ tablet, Take 1 tablet (20 mEq total) by mouth daily., Disp: 90 tablet, Rfl: 1  Past Medical History: Past Medical History:  Diagnosis Date  . Anemia   . Aortic insufficiency    a. 07/07/2016: TEE showing bicuspid aortic valve with severe eccentric AI   . Ascending aortic aneurysm (HCC)    4.4 cm by CT 06/2016  . 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 Bicuspid aortic valve   . Chronic periodontitis   . Coronary artery disease   . Pneumonia 1978  . 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 S/P patent foramen ovale closure 10/02/2016    Tobacco Use: Social History   Tobacco Use  Smoking Status Former Smoker  . Types: Cigarettes  . Last attempt to quit: 05/01/1991  . Years since quitting: 25.9  Smokeless Tobacco Never Used  Tobacco Comment   Quit 30 years ago.      Labs: Recent Review Flowsheet Data    Labs for ITP Cardiac and Pulmonary Rehab Latest Ref Rng & Units 10/06/2016 10/07/2016 10/07/2016 10/07/2016 10/08/2016   Cholestrol 0 - 200 mg/dL - - - - -   LDLCALC 0 - 99 mg/dL - - - - -   HDL >40 mg/dL - - - - -   Trlycerides <150 mg/dL - - - - -   Hemoglobin A1c 4.8 - 5.6 % - - - - -   PHART 7.350 - 7.450 7.484(H) 7.499(H) - - -   PCO2ART 32.0 - 48.0 mmHg 41.6 44.4 - - -   HCO3 20.0 - 28.0 mmol/L 31.4(H) 34.6(H) - - -   TCO2 0 - 100 mmol/L 33 36 - - -   ACIDBASEDEF 0.0 - 2.0 mmol/L - - - - -   O2SAT % 92.0 98.0 65.1 95.4 65.4  Capillary Blood Glucose: Lab Results  Component Value Date   GLUCAP 147 (H) 10/05/2016   GLUCAP 97 10/05/2016   GLUCAP 110 (H) 10/05/2016   GLUCAP 90 10/05/2016   GLUCAP 98 10/05/2016     Exercise Target Goals:    Exercise Program Goal: Individual exercise prescription set with THRR, safety & activity barriers. Participant demonstrates ability to understand and report RPE using BORG scale, to self-measure pulse accurately, and to acknowledge the importance of the exercise prescription.  Exercise Prescription Goal: Starting with aerobic activity 30 plus minutes a day, 3 days per week for initial exercise prescription. Provide home exercise prescription and guidelines that participant acknowledges understanding prior to discharge.  Activity Barriers & Risk Stratification: Activity Barriers & Cardiac Risk Stratification - 12/17/16 0957      Activity Barriers & Cardiac Risk Stratification   Activity  Barriers  Deconditioning;Incisional Pain    Cardiac Risk Stratification  High       6 Minute Walk: 6 Minute Walk    Row Name 12/17/16 0956         6 Minute Walk   Phase  Initial     Distance  1200 feet     Distance % Change  0 %     Distance Feet Change  0 ft     Walk Time  6 minutes     # of Rest Breaks  0     MPH  2.27     METS  2.74     RPE  9     Perceived Dyspnea   7     VO2 Peak  8.77     Symptoms  No     Resting HR  61 bpm     Resting BP  100/56     Resting Oxygen Saturation   95 %     Exercise Oxygen Saturation  during 6 min walk  91 %     Max Ex. HR  96 bpm     Max Ex. BP  124/70     2 Minute Post BP  100/58        Oxygen Initial Assessment:   Oxygen Re-Evaluation:   Oxygen Discharge (Final Oxygen Re-Evaluation):   Initial Exercise Prescription: Initial Exercise Prescription - 12/17/16 0800      Date of Initial Exercise RX and Referring Provider   Date  12/17/16    Referring Provider  DR. McDowell      Treadmill   MPH  1.5    Grade  0    Minutes  15    METs  2.2      Recumbant Elliptical   Level  1    RPM  40    Watts  41    Minutes  20    METs  2.4      Prescription Details   Frequency (times per week)  3    Duration  Progress to 30 minutes of continuous aerobic without signs/symptoms of physical distress      Intensity   THRR 40-80% of Max Heartrate  [phone]    Ratings of Perceived Exertion  11-13    Perceived Dyspnea  0-4      Progression   Progression  Continue progressive overload as per policy without signs/symptoms or physical distress.      Resistance Training   Training Prescription  Yes    Weight  1    Reps  10-15       Perform Capillary Blood Glucose  checks as needed.  Exercise Prescription Changes:  Exercise Prescription Changes    Row Name 12/24/16 1400 12/28/16 1400 01/12/17 0700 01/25/17 1400 02/04/17 0800     Response to Exercise   Blood Pressure (Admit)  -  136/80  124/70  124/74  136/76   Blood  Pressure (Exercise)  -  144/82  144/70  160/76  144/72   Blood Pressure (Exit)  -  126/76  124/66  120/70  134/72   Heart Rate (Admit)  -  68 bpm  63 bpm  47 bpm  81 bpm   Heart Rate (Exercise)  -  70 bpm  74 bpm  77 bpm  81 bpm   Heart Rate (Exit)  -  70 bpm  72 bpm  57 bpm  67 bpm   Rating of Perceived Exertion (Exercise)  -  10  11  11  12    Duration  -  Progress to 30 minutes of  aerobic without signs/symptoms of physical distress  Progress to 30 minutes of  aerobic without signs/symptoms of physical distress  Progress to 30 minutes of  aerobic without signs/symptoms of physical distress  Progress to 30 minutes of  aerobic without signs/symptoms of physical distress   Intensity  -  THRR New [phone]  THRR New 98-116-133  THRR New 89-109-130  THRR New 109-123-137     Progression   Progression  -  Continue to progress workloads to maintain intensity without signs/symptoms of physical distress.  Continue to progress workloads to maintain intensity without signs/symptoms of physical distress.  Continue to progress workloads to maintain intensity without signs/symptoms of physical distress.  Continue to progress workloads to maintain intensity without signs/symptoms of physical distress.     Resistance Training   Training Prescription  Yes  Yes  Yes  Yes  Yes   Weight  1  1  2  3  3    Reps  10-15  10-15  10-15  10-15  10-15     Treadmill   MPH  1.5  1.7  2  2.1  2.3   Grade  0  0  0  0  0   Minutes  15  20  20  20  20    METs  2.2  2.3  2.5  2.6  2.7     NuStep   Level  -  2  3  3  3    SPM  -  77  89  103  100   Minutes  -  15  15  15  15    METs  -  1.9  2.4  2.8  2.9     Recumbant Elliptical   Level  1  -  -  -  -   RPM  40  -  -  -  -   Watts  41  -  -  -  -   Minutes  20  -  -  -  -   METs  2.4  -  -  -  -     Home Exercise Plan   Plans to continue exercise at  Home (comment)  Home (comment)  Home (comment)  Home (comment)  Home (comment)   Frequency  Add 2 additional days  to program exercise sessions.  Add 2 additional days to program exercise sessions.  Add 2 additional days to program exercise sessions.  Add 2 additional days to program exercise sessions.  Add 2 additional days to program  exercise sessions.   Initial Home Exercises Provided  12/21/16  12/21/16  12/21/16  12/21/16  12/21/16   Row Name 02/23/17 0700 03/08/17 1000 03/29/17 1400         Response to Exercise   Blood Pressure (Admit)  132/72  144/78  138/70     Blood Pressure (Exercise)  140/80  148/82  150/88     Blood Pressure (Exit)  118/66  136/80  112/70     Heart Rate (Admit)  69 bpm  77 bpm  62 bpm     Heart Rate (Exercise)  120 bpm  71 bpm  93 bpm     Heart Rate (Exit)  78 bpm  79 bpm  71 bpm     Rating of Perceived Exertion (Exercise)  12  12  14      Duration  Progress to 30 minutes of  aerobic without signs/symptoms of physical distress  Progress to 30 minutes of  aerobic without signs/symptoms of physical distress  Progress to 30 minutes of  aerobic without signs/symptoms of physical distress     Intensity  THRR New 102-118-135  THRR New 107-121-136  THRR New [phone]       Progression   Progression  Continue to progress workloads to maintain intensity without signs/symptoms of physical distress.  Continue to progress workloads to maintain intensity without signs/symptoms of physical distress.  Continue to progress workloads to maintain intensity without signs/symptoms of physical distress.       Resistance Training   Training Prescription  Yes  Yes  Yes     Weight  4  4  4      Reps  10-15  10-15  10-15       Treadmill   MPH  2.7  2.7  2.7     Grade  0  0  0     Minutes  20  20  20      METs  3  3  3        NuStep   Level  4  4  4      SPM  94  96  113     Minutes  15  15  15      METs  3.1  3  3.1       Home Exercise Plan   Plans to continue exercise at  Home (comment)  Home (comment)  Home (comment)     Frequency  Add 2 additional days to program exercise sessions.  Add 2  additional days to program exercise sessions.  Add 2 additional days to program exercise sessions.     Initial Home Exercises Provided  12/21/16  12/21/16  12/21/16        Exercise Comments:  Exercise Comments    Row Name 12/24/16 1433 12/28/16 1444 01/12/17 0750 01/25/17 1421 02/04/17 0803   Exercise Comments  Patient was given his take home exercise plan. He expressed an understanding of the plan. We addressed his THR and safe ways to stay active outside of CR. He stated that he would soon do mild exercises at home. He was encouraged to come to me if he had any further questions.   Patient is doing well in the program. He was taken off the recumbent elliptical due to back pain.   Patient is doing well in CR. Patient stated that he currently walks for thirty minutes at a time when he is not at CR.   Patient is doing well in CR.  Patient is  doing very well in CR. He continues to work out 3 days a week when he is not in CR.   [place] Name 03/08/17 1007 03/29/17 1410         Exercise Comments  Patient is doing well in CR. He admits to being active outside of class. He is maintaining his levels and watts on both machines and he states that this program is helping him  Patient is doing well in CR. He has maintained his level on the treadmill walking a 2.7 while increasing his SPMs on the Nustep. He has only one more visit in CR and he is interested in Maintenance program. He stil;l walks a lot throughout the week when not in CR.          Exercise Goals and Review:  Exercise Goals    Row Name 12/17/16 0958             Exercise Goals   Increase Physical Activity  Yes       Intervention  Provide advice, education, support and counseling about physical activity/exercise needs.;Develop an individualized exercise prescription for aerobic and resistive training based on initial evaluation findings, risk stratification, comorbidities and participant's personal goals.       Expected Outcomes   Achievement of increased cardiorespiratory fitness and enhanced flexibility, muscular endurance and strength shown through measurements of functional capacity and personal statement of participant.       Increase Strength and Stamina  Yes       Intervention  Provide advice, education, support and counseling about physical activity/exercise needs.;Develop an individualized exercise prescription for aerobic and resistive training based on initial evaluation findings, risk stratification, comorbidities and participant's personal goals.       Expected Outcomes  Achievement of increased cardiorespiratory fitness and enhanced flexibility, muscular endurance and strength shown through measurements of functional capacity and personal statement of participant.       Able to understand and use rate of perceived exertion (RPE) scale  Yes       Intervention  Provide education and explanation on how to use RPE scale       Expected Outcomes  Short Term: Able to use RPE daily in rehab to express subjective intensity level;Long Term:  Able to use RPE to guide intensity level when exercising independently       Able to understand and use Dyspnea scale  Yes       Intervention  Provide education and explanation on how to use Dyspnea scale       Expected Outcomes  Short Term: Able to use Dyspnea scale daily in rehab to express subjective sense of shortness of breath during exertion;Long Term: Able to use Dyspnea scale to guide intensity level when exercising independently       Knowledge and understanding of Target Heart Rate Range (THRR)  Yes       Intervention  Provide education and explanation of THRR including how the numbers were predicted and where they are located for reference       Expected Outcomes  Short Term: Able to state/look up THRR       Able to check pulse independently  Yes       Intervention  Provide education and demonstration on how to check pulse in carotid and radial arteries.;Review the importance  of being able to check your own pulse for safety during independent exercise       Expected Outcomes  Short Term: Able to explain why pulse checking is  important during independent exercise;Long Term: Able to check pulse independently and accurately       Understanding of Exercise Prescription  Yes       Intervention  Provide education, explanation, and written materials on patient's individual exercise prescription       Expected Outcomes  Short Term: Able to explain program exercise prescription;Long Term: Able to explain home exercise prescription to exercise independently          Exercise Goals Re-Evaluation : Exercise Goals Re-Evaluation    Row Name 12/28/16 1444 01/12/17 0748 02/04/17 0802 03/08/17 1005 03/29/17 1408     Exercise Goal Re-Evaluation   Exercise Goals Review  Increase Physical Activity;Increase Strength and Stamina;Knowledge and understanding of Target Heart Rate Range (THRR)  Increase Physical Activity;Increase Strength and Stamina;Knowledge and understanding of Target Heart Rate Range (THRR)  Increase Physical Activity;Increase Strength and Stamina;Knowledge and understanding of Target Heart Rate Range (THRR)  Increase Strength and Stamina;Increase Physical Activity;Knowledge and understanding of Target Heart Rate Range (THRR)  Increase Physical Activity;Increase Strength and Stamina;Knowledge and understanding of Target Heart Rate Range (THRR)   Comments  Patient is doing well in CR.   Patient is doing well in CR. Patient stated that he currently walks for thirty minutes at a time when he is not at CR.   Patient is doing very well in CR. He continues to work out 3 days a week when he is not in CR.  Patient is doing well in CR. He admits to being active outside of class. He is maintaining his levels and watts on both machines and he states that this program is helping him.   Patient is doing well in CR. He has maintained his level on the treadmill walking a 2.7 while increasing  his SPMs on the Nustep. He has only one more visit in CR and he is interested in Maintenance program. He stil;l walks a lot throughout the week when not in CR.    Expected Outcomes  Patient is trying to get back into better shape and lose weight through program.   Patient wants to gain full mobility and to get back to driving   Goals are to get full mobility and back to driving   Patient wishes for full mobility and to get back to driving.   Patient wishes to gain more mobility and to get back to driving.       Discharge Exercise Prescription (Final Exercise Prescription Changes): Exercise Prescription Changes - 03/29/17 1400      Response to Exercise   Blood Pressure (Admit)  138/70    Blood Pressure (Exercise)  150/88    Blood Pressure (Exit)  112/70    Heart Rate (Admit)  62 bpm    Heart Rate (Exercise)  93 bpm    Heart Rate (Exit)  71 bpm    Rating of Perceived Exertion (Exercise)  14    Duration  Progress to 30 minutes of  aerobic without signs/symptoms of physical distress    Intensity  THRR New [phone]      Progression   Progression  Continue to progress workloads to maintain intensity without signs/symptoms of physical distress.      Resistance Training   Training Prescription  Yes    Weight  4    Reps  10-15      Treadmill   MPH  2.7    Grade  0    Minutes  20    METs  3  NuStep   Level  4    SPM  113    Minutes  15    METs  3.1      Home Exercise Plan   Plans to continue exercise at  Home (comment)    Frequency  Add 2 additional days to program exercise sessions.    Initial Home Exercises Provided  12/21/16       Nutrition:  Target Goals: Understanding of nutrition guidelines, daily intake of sodium 1500mg , cholesterol 200mg , calories 30% from fat and 7% or less from saturated fats, daily to have 5 or more servings of fruits and vegetables.  Biometrics: Pre Biometrics - 12/17/16 0841      Pre Biometrics   Height  6' (1.829 m)    Weight  231 lb  14.8 oz (105.2 kg)    Waist Circumference  44 inches    Hip Circumference  46.5 inches    Waist to Hip Ratio  0.95 %    BMI (Calculated)  31.45    Triceps Skinfold  16 mm    % Body Fat  30.7 %    Grip Strength  83.2 kg    Flexibility  14 in    Single Leg Stand  9 seconds        Nutrition Therapy Plan and Nutrition Goals: Nutrition Therapy & Goals - 01/18/17 0813      Personal Nutrition Goals   Nutrition Goal  For heart healthy choices add >50% of whole grains, make half their plate fruits and vegetables. Discuss the difference between starchy vegetables and leafy greens, and how leafy vegetables provide fiber, helps maintain healthy weight, helps control blood glucose, and lowers cholesterol.  Discuss purchasing fresh or frozen vegetable to reduce sodium and not to add grease, fat or sugar. Consume <18oz of red meat per week. Consume lean cuts of meats and very little of meats high in sodium and nitrates such as pork and lunch meats. Discussed portion control for all food groups      Intervention Plan   Intervention  Nutrition handout(s) given to patient.    Expected Outcomes  Short Term Goal: Understand basic principles of dietary content, such as calories, fat, sodium, cholesterol and nutrients.       Nutrition Discharge: Rate Your Plate Scores:   Nutrition Goals Re-Evaluation: Nutrition Goals Re-Evaluation    Row Name 02/10/17 1325 03/08/17 1419 03/31/17 1421         Goals   Current Weight  234 lb 9.6 oz (106.4 kg)  238 lb 1.6 oz (108 kg)  238 lb 1.6 oz (108 kg)     Nutrition Goal  For heart healthy choices add >50% of whole grains, make half their plate fruits and vegetables. Discuss the difference between starchy vegetables and leafy greens, and how leafy vegetables provide fiber, helps maintain healthy weight, helps control blood glucose, and lowers cholesterol.  Discuss purchasing fresh or frozen vegetable to reduce sodium and not to add grease, fat or sugar. Consume <18oz  of red meat per week. Consume lean cuts of meats and very little of meats high in sodium and nitrates such as pork and lunch meats. Discussed portion control for all food groups.    For heart healthy choices add >50% of whole grains, make half their plate fruits and vegetables. Discuss the difference between starchy vegetables and leafy greens, and how leafy vegetables provide fiber, helps maintain healthy weight, helps control blood glucose, and lowers cholesterol.  Discuss purchasing fresh or frozen  vegetable to reduce sodium and not to add grease, fat or sugar. Consume <18oz of red meat per week. Consume lean cuts of meats and very little of meats high in sodium and nitrates such as pork and lunch meats. Discussed portion control for all food groups.    For heart healthy choices add >50% of whole grains, make half their plate fruits and vegetables. Discuss the difference between starchy vegetables and leafy greens, and how leafy vegetables provide fiber, helps maintain healthy weight, helps control blood glucose, and lowers cholesterol.  Discuss purchasing fresh or frozen vegetable to reduce sodium and not to add grease, fat or sugar. Consume <18oz of red meat per week. Consume lean cuts of meats and very little of meats high in sodium and nitrates such as pork and lunch meats. Discussed portion control for all food groups.     Comment  Patient has lost 3 lbs since last 30 Day ITP review. He says he has stopped eating ice cream at night and is trying to eat smaller portions and more vegetables and fruit. Will continue to monitor.   Patient says he is trying to meet his nutrition goal. He continues to eat ice cream which he blames on his inability to lose weight.   Patient says he is meeting his nutritional goals.      Expected Outcome  Patient will continue to work toward meeting his nutrition goals.   Patient will continue to meet his nutrition goals.   Patient will continue to meet his nutrition goals.          Nutrition Goals Discharge (Final Nutrition Goals Re-Evaluation): Nutrition Goals Re-Evaluation - 03/31/17 1421      Goals   Current Weight  238 lb 1.6 oz (108 kg)    Nutrition Goal  For heart healthy choices add >50% of whole grains, make half their plate fruits and vegetables. Discuss the difference between starchy vegetables and leafy greens, and how leafy vegetables provide fiber, helps maintain healthy weight, helps control blood glucose, and lowers cholesterol.  Discuss purchasing fresh or frozen vegetable to reduce sodium and not to add grease, fat or sugar. Consume <18oz of red meat per week. Consume lean cuts of meats and very little of meats high in sodium and nitrates such as pork and lunch meats. Discussed portion control for all food groups.    Comment  Patient says he is meeting his nutritional goals.     Expected Outcome  Patient will continue to meet his nutrition goals.        Psychosocial: Target Goals: Acknowledge presence or absence of significant depression and/or stress, maximize coping skills, provide positive support system. Participant is able to verbalize types and ability to use techniques and skills needed for reducing stress and depression.  Initial Review & Psychosocial Screening: Initial Psych Review & Screening - 12/17/16 1134      Initial Review   Current issues with  None Identified      Family Dynamics   Good Support System?  Yes      Barriers   Psychosocial barriers to participate in program  There are no identifiable barriers or psychosocial needs.      Screening Interventions   Interventions  Encouraged to exercise       Quality of Life Scores: Quality of Life - 12/17/16 0842      Quality of Life Scores   Health/Function Pre  17.77 %    Socioeconomic Pre  16.36 %    Psych/Spiritual  Pre  20.79 %    Family Pre  22.2 %    GLOBAL Pre  18.75 %       PHQ-9: Recent Review Flowsheet Data    Depression screen [facility] 2/9 12/17/2016 09/08/2016  07/28/2016   Decreased Interest - 0 0   Down, Depressed, Hopeless - 0 0   PHQ - 2 Score - 0 0   Altered sleeping 0 - -   Tired, decreased energy 0 - -   Change in appetite 0 - -   Feeling bad or failure about yourself  1 - -   Trouble concentrating 0 - -   Moving slowly or fidgety/restless 0 - -   Suicidal thoughts 0 - -   Difficult doing work/chores Somewhat difficult - -     Interpretation of Total Score  Total Score Depression Severity:  1-4 = Minimal depression, 5-9 = Mild depression, 10-14 = Moderate depression, 15-19 = Moderately severe depression, 20-27 = Severe depression   Psychosocial Evaluation and Intervention: Psychosocial Evaluation - 12/17/16 1146      Psychosocial Evaluation & Interventions   Interventions  Encouraged to exercise with the program and follow exercise prescription    Continue Psychosocial Services   No Follow up required       Psychosocial Re-Evaluation: Psychosocial Re-Evaluation    Row Name 01/13/17 1301 02/10/17 1331 03/08/17 1418 03/31/17 1421       Psychosocial Re-Evaluation   Current issues with  None Identified  None Identified  None Identified  None Identified    Expected Outcomes  Patient will have no psychosocial issues identified at discharge.   Patient will have no psychosocial issues identified at discharge.   Patient will have no psychosocial issues identified at discharge.   Patient will have no psychosocial barriers identified at discharge.     Interventions  Encouraged to attend Cardiac Rehabilitation for the exercise  Encouraged to attend Cardiac Rehabilitation for the exercise;Stress management education;Relaxation education  Encouraged to attend Cardiac Rehabilitation for the exercise;Stress management education;Relaxation education  Encouraged to attend Cardiac Rehabilitation for the exercise;Stress management education;Relaxation education    Continue Psychosocial Services   No Follow up required  No Follow up required  No Follow  up required  No Follow up required       Psychosocial Discharge (Final Psychosocial Re-Evaluation): Psychosocial Re-Evaluation - 03/31/17 1421      Psychosocial Re-Evaluation   Current issues with  None Identified    Expected Outcomes  Patient will have no psychosocial barriers identified at discharge.     Interventions  Encouraged to attend Cardiac Rehabilitation for the exercise;Stress management education;Relaxation education    Continue Psychosocial Services   No Follow up required       Vocational Rehabilitation: Provide vocational rehab assistance to qualifying candidates.   Vocational Rehab Evaluation & Intervention: Vocational Rehab - 12/17/16 1130      Initial Vocational Rehab Evaluation & Intervention   Assessment shows need for Vocational Rehabilitation  No       Education: Education Goals: Education classes will be provided on a weekly basis, covering required topics. Participant will state understanding/return demonstration of topics presented.  Learning Barriers/Preferences: Learning Barriers/Preferences - 12/17/16 1128      Learning Barriers/Preferences   Learning Barriers  None    Learning Preferences  Individual Instruction;Group Instruction;Skilled Demonstration       Education Topics: Hypertension, Hypertension Reduction -Define heart disease and high blood pressure. Discus how high blood pressure affects the body and ways to  reduce high blood pressure.   CARDIAC REHAB PHASE II EXERCISE from 03/17/2017 in [place]  Date  02/17/17  Educator  DJ  Instruction Review Code  2- Demonstrated Understanding      Exercise and Your Heart -Discuss why it is important to exercise, the FITT principles of exercise, normal and abnormal responses to exercise, and how to exercise safely.   CARDIAC REHAB PHASE II EXERCISE from 03/17/2017 in [place]  Date  02/24/17  Educator  DJ  Instruction Review Code  2-  Demonstrated Understanding      Angina -Discuss definition of angina, causes of angina, treatment of angina, and how to decrease risk of having angina.   CARDIAC REHAB PHASE II EXERCISE from 03/17/2017 in [place]  Date  03/03/17  Educator  D. Coad  Instruction Review Code  2- Demonstrated Understanding      Cardiac Medications -Review what the following cardiac medications are used for, how they affect the body, and side effects that may occur when taking the medications.  Medications include Aspirin, Beta blockers, calcium channel blockers, ACE Inhibitors, angiotensin receptor blockers, diuretics, digoxin, and antihyperlipidemics.   CARDIAC REHAB PHASE II EXERCISE from 03/17/2017 in [place]  Date  03/10/17  Educator  DJ  Instruction Review Code  2- Demonstrated Understanding      Congestive Heart Failure -Discuss the definition of CHF, how to live with CHF, the signs and symptoms of CHF, and how keep track of weight and sodium intake.   CARDIAC REHAB PHASE II EXERCISE from 03/17/2017 in [place]  Date  03/17/17  Educator  DC  Instruction Review Code  2- Demonstrated Understanding      Heart Disease and Intimacy -Discus the effect sexual activity has on the heart, how changes occur during intimacy as we age, and safety during sexual activity.   Smoking Cessation / COPD -Discuss different methods to quit smoking, the health benefits of quitting smoking, and the definition of COPD.   CARDIAC REHAB PHASE II EXERCISE from 03/17/2017 in [place]  Date  12/23/16  Educator  DJ  Instruction Review Code  2- Demonstrated Understanding      Nutrition I: Fats -Discuss the types of cholesterol, what cholesterol does to the heart, and how cholesterol levels can be controlled.   CARDIAC REHAB PHASE II EXERCISE from 03/17/2017 in [place]  Date  12/30/16  Educator   DC  Instruction Review Code  2- Demonstrated Understanding      Nutrition II: Labels -Discuss the different components of food labels and how to read food label   CARDIAC REHAB PHASE II EXERCISE from 03/17/2017 in [place]  Date  01/06/17  Educator  Dc  Instruction Review Code  2- Demonstrated Understanding      Heart Parts and Heart Disease -Discuss the anatomy of the heart, the pathway of blood circulation through the heart, and these are affected by heart disease.   CARDIAC REHAB PHASE II EXERCISE from 03/17/2017 in [place]  Date  01/13/17  Educator  DJ  Instruction Review Code  2- Demonstrated Understanding      Stress I: Signs and Symptoms -Discuss the causes of stress, how stress may lead to anxiety and depression, and ways to limit stress.   Stress II: Relaxation -Discuss different types of relaxation techniques to limit stress.   CARDIAC REHAB PHASE II EXERCISE from 03/17/2017 in [place]  [facility]  Date  01/27/17  Educator  DJ  Instruction Review Code  2- Demonstrated Understanding      Warning Signs of Stroke / TIA -Discuss definition of a stroke, what the signs and symptoms are of a stroke, and how to identify when someone is having stroke.   CARDIAC REHAB PHASE II EXERCISE from 03/17/2017 in [place]  Date  02/03/17  Educator  [place]  Instruction Review Code  2- Demonstrated Understanding      Knowledge Questionnaire Score: Knowledge Questionnaire Score - 12/17/16 1130      Knowledge Questionnaire Score   Pre Score  21/24       Core Components/Risk Factors/Patient Goals at Admission: Personal Goals and Risk Factors at Admission - 12/17/16 1132      Core Components/Risk Factors/Patient Goals on Admission    Weight Management  Weight Maintenance    Improve shortness of breath with ADL's  Yes    Intervention  Provide education, individualized exercise plan and daily  activity instruction to help decrease symptoms of SOB with activities of daily living.    Expected Outcomes  Short Term: Achieves a reduction of symptoms when performing activities of daily living.    Personal Goal Other  Yes    Personal Goal  Full mobility, get back to driving    Intervention  Attend CR 3 x week and supplement with 2 days of home exercise.     Expected Outcomes  Reach goals.        Core Components/Risk Factors/Patient Goals Review:  Goals and Risk Factor Review    Row Name 01/13/17 1256 02/10/17 1328 03/08/17 1411 03/31/17 1418       Core Components/Risk Factors/Patient Goals Review   Personal Goals Review  Weight Management/Obesity;Improve shortness of breath with ADL's Full mobility and drive again.   Weight Management/Obesity;Improve shortness of breath with ADL's Full mobility; back to driving.  Weight Management/Obesity;Improve shortness of breath with ADL's Full mobility; drive again.  Weight Management/Obesity;Improve shortness of breath with ADL's Get full mobility back; drive again.    Review  Patient has completed 11 sessions gaining 5.4 lbs since he started the program. Patient was asked about his weight gain. He says he makes his own ice cream and he has been eating it every night. He attributes this to his gain. He has not attended the nutrition class but plans to attend the next class. He says he weighs himself everyday and is to call MD for a weight gain over 3 lbs/day or 5 lbs/wk. He says he does feel stronger and feels like his mobility is improving but has not started driving yet. His MD will release him to drive in December. Will continue to monitor.   Patient has completed 22 sessions. He continue to do well in the program. His life vest was removed 2 weeks ago. Patient says he will be able to drive 62/70/35. He says his mobility is getting better and he has more energy. He is walking on the days he is not in CR. He says he feels better overall since he started  the program. Will continue to monitor.   Patient has completed 31 sessions gaining 4 lbs since last ITP. He continues to do well in the program progressing on the treadmill and nu-step. He says he feels much better and a lot stronger. He is walking on the days he is not in CR. He says has more energy to do things around the house and  feels a lot stronger. Will continue to monitor.   Patient has completed 35 sessions gaining 4 lbs since his last ITP. He says the program has helped him meet his goals. He feels like his heart is stronger and he feels better overall since he started. He is able to do yard work now without fatigue or SOB and is exercising 3 days/week in addition to CR. He plans to join our forever fit maintenance program to continue exercising.     Expected Outcomes  Patient will complete the program and continue to work toward meeting his personal goals.   Patient will complete the program and continue to meet his personal goals.   Patient will complete the program and continue to meet his personal goals.   Patient will continue exercising in our maintenance program and continue to meet his personal goals.        Core Components/Risk Factors/Patient Goals at Discharge (Final Review):  Goals and Risk Factor Review - 03/31/17 1418      Core Components/Risk Factors/Patient Goals Review   Personal Goals Review  Weight Management/Obesity;Improve shortness of breath with ADL's Get full mobility back; drive again.    Review  Patient has completed 35 sessions gaining 4 lbs since his last ITP. He says the program has helped him meet his goals. He feels like his heart is stronger and he feels better overall since he started. He is able to do yard work now without fatigue or SOB and is exercising 3 days/week in addition to CR. He plans to join our forever fit maintenance program to continue exercising.     Expected Outcomes  Patient will continue exercising in our maintenance program and continue to  meet his personal goals.        ITP Comments: ITP Comments    Row Name 12/17/16 1523           ITP Comments  Patient new to program. Plans to start Monday 12/21/16.          Comments: ITP 30 Day REVIEW Patient doing well in the program. Will continue to monitor for progress.

## 2017-04-12 NOTE — Progress Notes (Signed)
Discharge Progress Report  Patient Details  Name: Charles [name] MRN: [id number] Date of Birth: 1946/03/16 Referring Provider:     [place] from 12/17/2016 in [place]  Referring Provider  DR. McDowell       Number of Visits: 35  Reason for Discharge:  Patient reached a stable level of exercise. Patient independent in their exercise. Patient has met program and personal goals.  Smoking History:  Social History   Tobacco Use  Smoking Status Former Smoker  . Types: Cigarettes  . Last attempt to quit: 05/01/1991  . Years since quitting: 25.9  Smokeless Tobacco Never Used  Tobacco Comment   Quit 30 years ago.      Diagnosis:  S/P aortic valve replacement with allograft  S/P CABG x 1  ADL [facility]SD:   Initial Exercise Prescription: Initial Exercise Prescription - 12/17/16 0800      Date of Initial Exercise RX and Referring Provider   Date  12/17/16    Referring Provider  DR. McDowell      Treadmill   MPH  1.5    Grade  0    Minutes  15    METs  2.2      Recumbant Elliptical   Level  1    RPM  40    Watts  41    Minutes  20    METs  2.4      Prescription Details   Frequency (times per week)  3    Duration  Progress to 30 minutes of continuous aerobic without signs/symptoms of physical distress      Intensity   THRR 40-80% of Max Heartrate  [phone]    Ratings of Perceived Exertion  11-13    Perceived Dyspnea  0-4      Progression   Progression  Continue progressive overload as per policy without signs/symptoms or physical distress.      Resistance Training   Training Prescription  Yes    Weight  1    Reps  10-15       Discharge Exercise Prescription (Final Exercise Prescription Changes): Exercise Prescription Changes - 03/29/17 1400      Response to Exercise   Blood Pressure (Admit)  138/70    Blood Pressure (Exercise)  150/88    Blood Pressure (Exit)  112/70    Heart Rate (Admit)  62 bpm    Heart Rate (Exercise)  93 bpm    Heart Rate (Exit)  71 bpm    Rating of Perceived Exertion (Exercise)  14    Duration  Progress to 30 minutes of  aerobic without signs/symptoms of physical distress    Intensity  THRR New [phone]      Progression   Progression  Continue to progress workloads to maintain intensity without signs/symptoms of physical distress.      Resistance Training   Training Prescription  Yes    Weight  4    Reps  10-15      Treadmill   MPH  2.7    Grade  0    Minutes  20    METs  3      NuStep   Level  4    SPM  113    Minutes  15    METs  3.1      Home Exercise Plan   Plans to continue exercise at  Home (comment)    Frequency  Add 2 additional days to program exercise  sessions.    Initial Home Exercises Provided  12/21/16       Functional Capacity: 6 Minute Walk    Row Name 12/17/16 0956 04/07/17 1353       6 Minute Walk   Phase  Initial  Discharge    Distance  1200 feet  1400 feet    Distance % Change  0 %  16.67 %    Distance Feet Change  0 ft  200 ft    Walk Time  6 minutes  6 minutes    # of Rest Breaks  0  0    MPH  2.27  2.65    METS  2.74  3.03    RPE  9  11    Perceived Dyspnea   7  11    VO2 Peak  8.77  10.8    Symptoms  No  No    Resting HR  61 bpm  63 bpm    Resting BP  100/56  128/68    Resting Oxygen Saturation   95 %  94 %    Exercise Oxygen Saturation  during 6 min walk  91 %  95 %    Max Ex. HR  96 bpm  113 bpm    Max Ex. BP  124/70  140/70    2 Minute Post BP  100/58  124/66       Psychological, QOL, Others - Outcomes: PHQ 2/9: Depression screen [facility] 2/9 04/02/2017 12/17/2016 09/08/2016 07/28/2016  Decreased Interest 0 - 0 0  Down, Depressed, Hopeless 1 - 0 0  PHQ - 2 Score 1 - 0 0  Altered sleeping 0 0 - -  Tired, decreased energy 1 0 - -  Change in appetite 0 0 - -  Feeling bad or failure about yourself  0 1 - -  Trouble concentrating 0 0 - -  Moving slowly or fidgety/restless 0 0 - -  Suicidal thoughts 0 0 -  -  PHQ-9 Score 2 - - -  Difficult doing work/chores Not difficult at all Somewhat difficult - -    Quality of Life: Quality of Life - 04/07/17 1357      Quality of Life Scores   Health/Function Pre  17.77 %    Health/Function Post  21.5 %    Health/Function % Change  20.99 %    Socioeconomic Pre  16.36 %    Socioeconomic Post  17.83 %    Socioeconomic % Change   8.99 %    Psych/Spiritual Pre  20.79 %    Psych/Spiritual Post  21.64 %    Psych/Spiritual % Change  4.09 %    Family Pre  22.2 %    Family Post  20.7 %    Family % Change  -6.76 %    GLOBAL Pre  18.75 %    GLOBAL Post  20.74 %    GLOBAL % Change  10.61 %       Personal Goals: Goals established at orientation with interventions provided to work toward goal. Personal Goals and Risk Factors at Admission - 12/17/16 1132      Core Components/Risk Factors/Patient Goals on Admission    Weight Management  Weight Maintenance    Improve shortness of breath with ADL's  Yes    Intervention  Provide education, individualized exercise plan and daily activity instruction to help decrease symptoms of SOB with activities of daily living.    Expected Outcomes  Short Term: Achieves a reduction  of symptoms when performing activities of daily living.    Personal Goal Other  Yes    Personal Goal  Full mobility, get back to driving    Intervention  Attend CR 3 x week and supplement with 2 days of home exercise.     Expected Outcomes  Reach goals.         Personal Goals Discharge: Goals and Risk Factor Review    Row Name 01/13/17 1256 02/10/17 1328 03/08/17 1411 03/31/17 1418 04/02/17 1519     Core Components/Risk Factors/Patient Goals Review   Personal Goals Review  Weight Management/Obesity;Improve shortness of breath with ADL's Full mobility and drive again.   Weight Management/Obesity;Improve shortness of breath with ADL's Full mobility; back to driving.  Weight Management/Obesity;Improve shortness of breath with ADL's Full  mobility; drive again.  Weight Management/Obesity;Improve shortness of breath with ADL's Get full mobility back; drive again.  Weight Management/Obesity;Improve shortness of breath with ADL's Get full mobility back; drive again.   Review  Patient has completed 11 sessions gaining 5.4 lbs since he started the program. Patient was asked about his weight gain. He says he makes his own ice cream and he has been eating it every night. He attributes this to his gain. He has not attended the nutrition class but plans to attend the next class. He says he weighs himself everyday and is to call MD for a weight gain over 3 lbs/day or 5 lbs/wk. He says he does feel stronger and feels like his mobility is improving but has not started driving yet. His MD will release him to drive in December. Will continue to monitor.   Patient has completed 22 sessions. He continue to do well in the program. His life vest was removed 2 weeks ago. Patient says he will be able to drive 34/19/62. He says his mobility is getting better and he has more energy. He is walking on the days he is not in CR. He says he feels better overall since he started the program. Will continue to monitor.   Patient has completed 31 sessions gaining 4 lbs since last ITP. He continues to do well in the program progressing on the treadmill and nu-step. He says he feels much better and a lot stronger. He is walking on the days he is not in CR. He says has more energy to do things around the house and feels a lot stronger. Will continue to monitor.   Patient has completed 35 sessions gaining 4 lbs since his last ITP. He says the program has helped him meet his goals. He feels like his heart is stronger and he feels better overall since he started. He is able to do yard work now without fatigue or SOB and is exercising 3 days/week in addition to CR. He plans to join our forever fit maintenance program to continue exercising.   Patient graduated completing 35 sessions  gaining 6 lbs overall. He says the program has helped him meet his goals. He feels like his heart is stronger and he feels better overall since he started. He is able to do yard work now without fatigue or SOB and is exercising 3 days/week in addition to CR. His exit measurements improved in grip strenght and his walk test improved by 16.67%.  He plans to join our forever fit maintenance program to continue exercising.    Expected Outcomes  Patient will complete the program and continue to work toward meeting his personal goals.   Patient will  complete the program and continue to meet his personal goals.   Patient will complete the program and continue to meet his personal goals.   Patient will continue exercising in our maintenance program and continue to meet his personal goals.   Patient plans to continue exercising in our forever fit maintenance program and continue to meet his personal goals.       Exercise Goals and Review: Exercise Goals    Row Name 12/17/16 0958             Exercise Goals   Increase Physical Activity  Yes       Intervention  Provide advice, education, support and counseling about physical activity/exercise needs.;Develop an individualized exercise prescription for aerobic and resistive training based on initial evaluation findings, risk stratification, comorbidities and participant's personal goals.       Expected Outcomes  Achievement of increased cardiorespiratory fitness and enhanced flexibility, muscular endurance and strength shown through measurements of functional capacity and personal statement of participant.       Increase Strength and Stamina  Yes       Intervention  Provide advice, education, support and counseling about physical activity/exercise needs.;Develop an individualized exercise prescription for aerobic and resistive training based on initial evaluation findings, risk stratification, comorbidities and participant's personal goals.       Expected  Outcomes  Achievement of increased cardiorespiratory fitness and enhanced flexibility, muscular endurance and strength shown through measurements of functional capacity and personal statement of participant.       Able to understand and use rate of perceived exertion (RPE) scale  Yes       Intervention  Provide education and explanation on how to use RPE scale       Expected Outcomes  Short Term: Able to use RPE daily in rehab to express subjective intensity level;Long Term:  Able to use RPE to guide intensity level when exercising independently       Able to understand and use Dyspnea scale  Yes       Intervention  Provide education and explanation on how to use Dyspnea scale       Expected Outcomes  Short Term: Able to use Dyspnea scale daily in rehab to express subjective sense of shortness of breath during exertion;Long Term: Able to use Dyspnea scale to guide intensity level when exercising independently       Knowledge and understanding of Target Heart Rate Range (THRR)  Yes       Intervention  Provide education and explanation of THRR including how the numbers were predicted and where they are located for reference       Expected Outcomes  Short Term: Able to state/look up THRR       Able to check pulse independently  Yes       Intervention  Provide education and demonstration on how to check pulse in carotid and radial arteries.;Review the importance of being able to check your own pulse for safety during independent exercise       Expected Outcomes  Short Term: Able to explain why pulse checking is important during independent exercise;Long Term: Able to check pulse independently and accurately       Understanding of Exercise Prescription  Yes       Intervention  Provide education, explanation, and written materials on patient's individual exercise prescription       Expected Outcomes  Short Term: Able to explain program exercise prescription;Long Term: Able to explain home exercise  prescription  to exercise independently          Nutrition & Weight - Outcomes: Pre Biometrics - 12/17/16 0841      Pre Biometrics   Height  6' (1.829 m)    Weight  231 lb 14.8 oz (105.2 kg)    Waist Circumference  44 inches    Hip Circumference  46.5 inches    Waist to Hip Ratio  0.95 %    BMI (Calculated)  31.45    Triceps Skinfold  16 mm    % Body Fat  30.7 %    Grip Strength  83.2 kg    Flexibility  14 in    Single Leg Stand  9 seconds      Post Biometrics - 04/07/17 1356       Post  Biometrics   Height  6' (1.829 m)    Weight  238 lb 9.3 oz (108.2 kg)    Waist Circumference  45 inches    Hip Circumference  47 inches    Waist to Hip Ratio  0.96 %    BMI (Calculated)  32.35    Triceps Skinfold  15 mm    % Body Fat  31.2 %    Grip Strength  96.6 kg    Flexibility  12.33 in    Single Leg Stand  6 seconds       Nutrition: Nutrition Therapy & Goals - 01/18/17 0813      Personal Nutrition Goals   Nutrition Goal  For heart healthy choices add >50% of whole grains, make half their plate fruits and vegetables. Discuss the difference between starchy vegetables and leafy greens, and how leafy vegetables provide fiber, helps maintain healthy weight, helps control blood glucose, and lowers cholesterol.  Discuss purchasing fresh or frozen vegetable to reduce sodium and not to add grease, fat or sugar. Consume <18oz of red meat per week. Consume lean cuts of meats and very little of meats high in sodium and nitrates such as pork and lunch meats. Discussed portion control for all food groups      Intervention Plan   Intervention  Nutrition handout(s) given to patient.    Expected Outcomes  Short Term Goal: Understand basic principles of dietary content, such as calories, fat, sodium, cholesterol and nutrients.       Nutrition Discharge: Nutrition Assessments - 04/02/17 1517      MEDFICTS Scores   Pre Score  59    Post Score  27    Score Difference  -32       Education  Questionnaire Score: Knowledge Questionnaire Score - 04/02/17 1517      Knowledge Questionnaire Score   Pre Score  21/24    Post Score  22/24

## 2017-04-12 NOTE — Addendum Note (Signed)
Encounter addended by: Dwana Melena, RN on: 04/12/2017 1:05 PM  Actions taken: Visit Navigator Flowsheet section accepted, Sign clinical note, Episode resolved

## 2017-04-12 NOTE — Progress Notes (Signed)
Cardiac Individual Treatment Plan  Patient Details  Name: Charles [name] MRN: [id number] Date of Birth: 1945/12/03 Referring Provider:     CARDIAC REHAB PHASE II ORIENTATION from 12/17/2016 in [facility]  Referring Provider  DR. McDowell      Initial Encounter Date:    CARDIAC REHAB PHASE II ORIENTATION from 12/17/2016 in [facility]  Date  12/17/16  Referring Provider  DR. McDowell      Visit Diagnosis: S/P aortic valve replacement with allograft  S/P CABG x 1  Patient's Home Medications on Admission:  Current Outpatient Medications:  .  aspirin EC 81 MG tablet, Take 81 mg by mouth daily., Disp: , Rfl:  .  atorvastatin (LIPITOR) 20 MG tablet, Take 1 tablet (20 mg total) by mouth daily., Disp: 90 tablet, Rfl: 1 .  furosemide (LASIX) 20 MG tablet, Take 20 mg by mouth daily., Disp: , Rfl:  .  IRON PO, Take 1 tablet by mouth daily. OTC iron, Disp: , Rfl:  .  metoprolol tartrate (LOPRESSOR) 25 MG tablet, Take 1 tablet (25 mg total) by mouth 2 (two) times daily. Hold for HR less than 50, Disp: 180 tablet, Rfl: 1 .  Multiple Vitamin (MULTIVITAMIN WITH MINERALS) TABS tablet, Take 1 tablet by mouth daily., Disp: , Rfl:  .  potassium chloride SA (K-DUR,KLOR-CON) 20 MEQ tablet, Take 1 tablet (20 mEq total) by mouth daily., Disp: 90 tablet, Rfl: 1  Past Medical History: Past Medical History:  Diagnosis Date  . Anemia   . Aortic insufficiency    a. 07/07/2016: TEE showing bicuspid aortic valve with severe eccentric AI   . Ascending aortic aneurysm (HCC)    4.4 cm by CT 06/2016  . 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 Bicuspid aortic valve   . Chronic periodontitis   . Coronary artery disease   . Pneumonia 1978  . 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 S/P patent foramen ovale closure 10/02/2016    Tobacco Use: Social History   Tobacco Use  Smoking Status Former Smoker  . Types: Cigarettes  . Last attempt to quit: 05/01/1991  . Years since quitting: 25.9  Smokeless Tobacco Never Used  Tobacco Comment   Quit 30 years ago.      Labs: Recent Review Flowsheet Data    Labs for ITP Cardiac and Pulmonary Rehab Latest Ref Rng & Units 10/06/2016 10/07/2016 10/07/2016 10/07/2016 10/08/2016   Cholestrol 0 - 200 mg/dL - - - - -   LDLCALC 0 - 99 mg/dL - - - - -   HDL >40 mg/dL - - - - -   Trlycerides <150 mg/dL - - - - -   Hemoglobin A1c 4.8 - 5.6 % - - - - -   PHART 7.350 - 7.450 7.484(H) 7.499(H) - - -   PCO2ART 32.0 - 48.0 mmHg 41.6 44.4 - - -   HCO3 20.0 - 28.0 mmol/L 31.4(H) 34.6(H) - - -   TCO2 0 - 100 mmol/L 33 36 - - -   ACIDBASEDEF 0.0 - 2.0 mmol/L - - - - -   O2SAT % 92.0 98.0 65.1 95.4 65.4  Capillary Blood Glucose: Lab Results  Component Value Date   GLUCAP 147 (H) 10/05/2016   GLUCAP 97 10/05/2016   GLUCAP 110 (H) 10/05/2016   GLUCAP 90 10/05/2016   GLUCAP 98 10/05/2016     Exercise Target Goals:    Exercise Program Goal: Individual exercise prescription set with THRR, safety & activity barriers. Participant demonstrates ability to understand and report RPE using BORG scale, to self-measure pulse accurately, and to acknowledge the importance of the exercise prescription.  Exercise Prescription Goal: Starting with aerobic activity 30 plus minutes a day, 3 days per week for initial exercise prescription. Provide home exercise prescription and guidelines that participant acknowledges understanding prior to discharge.  Activity Barriers & Risk Stratification: Activity Barriers & Cardiac Risk Stratification - 12/17/16 0957      Activity Barriers & Cardiac Risk Stratification   Activity  Barriers  Deconditioning;Incisional Pain    Cardiac Risk Stratification  High       6 Minute Walk: 6 Minute Walk    Row Name 12/17/16 0956 04/07/17 1353       6 Minute Walk   Phase  Initial  Discharge    Distance  1200 feet  1400 feet    Distance % Change  0 %  16.67 %    Distance Feet Change  0 ft  200 ft    Walk Time  6 minutes  6 minutes    # of Rest Breaks  0  0    MPH  2.27  2.65    METS  2.74  3.03    RPE  9  11    Perceived Dyspnea   7  11    VO2 Peak  8.77  10.8    Symptoms  No  No    Resting HR  61 bpm  63 bpm    Resting BP  100/56  128/68    Resting Oxygen Saturation   95 %  94 %    Exercise Oxygen Saturation  during 6 min walk  91 %  95 %    Max Ex. HR  96 bpm  113 bpm    Max Ex. BP  124/70  140/70    2 Minute Post BP  100/58  124/66       Oxygen Initial Assessment:   Oxygen Re-Evaluation:   Oxygen Discharge (Final Oxygen Re-Evaluation):   Initial Exercise Prescription: Initial Exercise Prescription - 12/17/16 0800      Date of Initial Exercise RX and Referring Provider   Date  12/17/16    Referring Provider  DR. McDowell      Treadmill   MPH  1.5    Grade  0    Minutes  15    METs  2.2      Recumbant Elliptical   Level  1    RPM  40    Watts  41    Minutes  20    METs  2.4      Prescription Details   Frequency (times per week)  3    Duration  Progress to 30 minutes of continuous aerobic without signs/symptoms of physical distress      Intensity   THRR 40-80% of Max Heartrate  [phone]    Ratings of Perceived Exertion  11-13    Perceived Dyspnea  0-4      Progression   Progression  Continue progressive overload as per policy without signs/symptoms or physical distress.      Resistance Training  Training Prescription  Yes    Weight  1    Reps  10-15       Perform Capillary Blood Glucose checks as needed.  Exercise Prescription Changes:  Exercise Prescription Changes    Row Name 12/24/16 1400 12/28/16 1400 01/12/17 0700  01/25/17 1400 02/04/17 0800     Response to Exercise   Blood Pressure (Admit)  -  136/80  124/70  124/74  136/76   Blood Pressure (Exercise)  -  144/82  144/70  160/76  144/72   Blood Pressure (Exit)  -  126/76  124/66  120/70  134/72   Heart Rate (Admit)  -  68 bpm  63 bpm  47 bpm  81 bpm   Heart Rate (Exercise)  -  70 bpm  74 bpm  77 bpm  81 bpm   Heart Rate (Exit)  -  70 bpm  72 bpm  57 bpm  67 bpm   Rating of Perceived Exertion (Exercise)  -  '10  11  11  12   '$ Duration  -  Progress to 30 minutes of  aerobic without signs/symptoms of physical distress  Progress to 30 minutes of  aerobic without signs/symptoms of physical distress  Progress to 30 minutes of  aerobic without signs/symptoms of physical distress  Progress to 30 minutes of  aerobic without signs/symptoms of physical distress   Intensity  -  THRR New [phone]  THRR New [phone]  THRR New 89-109-130  THRR New [phone]     Progression   Progression  -  Continue to progress workloads to maintain intensity without signs/symptoms of physical distress.  Continue to progress workloads to maintain intensity without signs/symptoms of physical distress.  Continue to progress workloads to maintain intensity without signs/symptoms of physical distress.  Continue to progress workloads to maintain intensity without signs/symptoms of physical distress.     Resistance Training   Training Prescription  Yes  Yes  Yes  Yes  Yes   Weight  '1  1  2  3  3   '$ Reps  10-15  10-15  10-15  10-15  10-15     Treadmill   MPH  1.5  1.7  2  2.1  2.3   Grade  0  0  0  0  0   Minutes  '15  20  20  20  20   '$ METs  2.2  2.3  2.5  2.6  2.7     NuStep   Level  -  '2  3  3  3   '$ SPM  -  77  89  103  100   Minutes  -  '15  15  15  15   '$ METs  -  1.9  2.4  2.8  2.9     Recumbant Elliptical   Level  1  -  -  -  -   RPM  40  -  -  -  -   Watts  41  -  -  -  -   Minutes  20  -  -  -  -   METs  2.4  -  -  -  -     Home Exercise Plan   Plans to continue  exercise at  Home (comment)  Home (comment)  Home (comment)  Home (comment)  Home (comment)   Frequency  Add 2 additional days to program exercise sessions.  Add 2 additional days to program exercise  sessions.  Add 2 additional days to program exercise sessions.  Add 2 additional days to program exercise sessions.  Add 2 additional days to program exercise sessions.   Initial Home Exercises Provided  12/21/16  12/21/16  12/21/16  12/21/16  12/21/16   Row Name 02/23/17 0700 03/08/17 1000 03/29/17 1400         Response to Exercise   Blood Pressure (Admit)  132/72  144/78  138/70     Blood Pressure (Exercise)  140/80  148/82  150/88     Blood Pressure (Exit)  118/66  136/80  112/70     Heart Rate (Admit)  69 bpm  77 bpm  62 bpm     Heart Rate (Exercise)  120 bpm  71 bpm  93 bpm     Heart Rate (Exit)  78 bpm  79 bpm  71 bpm     Rating of Perceived Exertion (Exercise)  '12  12  14     '$ Duration  Progress to 30 minutes of  aerobic without signs/symptoms of physical distress  Progress to 30 minutes of  aerobic without signs/symptoms of physical distress  Progress to 30 minutes of  aerobic without signs/symptoms of physical distress     Intensity  THRR New 102-118-135  THRR New 107-121-136  THRR New [phone]       Progression   Progression  Continue to progress workloads to maintain intensity without signs/symptoms of physical distress.  Continue to progress workloads to maintain intensity without signs/symptoms of physical distress.  Continue to progress workloads to maintain intensity without signs/symptoms of physical distress.       Resistance Training   Training Prescription  Yes  Yes  Yes     Weight  '4  4  4     '$ Reps  10-15  10-15  10-15       Treadmill   MPH  2.7  2.7  2.7     Grade  0  0  0     Minutes  '20  20  20     '$ METs  '3  3  3       '$ NuStep   Level  '4  4  4     '$ SPM  94  96  113     Minutes  '15  15  15     '$ METs  3.1  3  3.1       Home Exercise Plan   Plans to continue  exercise at  Home (comment)  Home (comment)  Home (comment)     Frequency  Add 2 additional days to program exercise sessions.  Add 2 additional days to program exercise sessions.  Add 2 additional days to program exercise sessions.     Initial Home Exercises Provided  12/21/16  12/21/16  12/21/16        Exercise Comments:  Exercise Comments    Row Name 12/24/16 1433 12/28/16 1444 01/12/17 0750 01/25/17 1421 02/04/17 0803   Exercise Comments  Patient was given his take home exercise plan. He expressed an understanding of the plan. We addressed his THR and safe ways to stay active outside of CR. He stated that he would soon do mild exercises at home. He was encouraged to come to me if he had any further questions.   Patient is doing well in the program. He was taken off the recumbent elliptical due to back pain.   Patient is doing well in CR. Patient stated that  he currently walks for thirty minutes at a time when he is not at CR.   Patient is doing well in CR.  Patient is doing very well in CR. He continues to work out 3 days a week when he is not in CR.   [place] Name 03/08/17 1007 03/29/17 1410         Exercise Comments  Patient is doing well in CR. He admits to being active outside of class. He is maintaining his levels and watts on both machines and he states that this program is helping him  Patient is doing well in CR. He has maintained his level on the treadmill walking a 2.7 while increasing his SPMs on the Nustep. He has only one more visit in CR and he is interested in Maintenance program. He stil;l walks a lot throughout the week when not in CR.          Exercise Goals and Review:  Exercise Goals    Row Name 12/17/16 0958             Exercise Goals   Increase Physical Activity  Yes       Intervention  Provide advice, education, support and counseling about physical activity/exercise needs.;Develop an individualized exercise prescription for aerobic and resistive training based on  initial evaluation findings, risk stratification, comorbidities and participant's personal goals.       Expected Outcomes  Achievement of increased cardiorespiratory fitness and enhanced flexibility, muscular endurance and strength shown through measurements of functional capacity and personal statement of participant.       Increase Strength and Stamina  Yes       Intervention  Provide advice, education, support and counseling about physical activity/exercise needs.;Develop an individualized exercise prescription for aerobic and resistive training based on initial evaluation findings, risk stratification, comorbidities and participant's personal goals.       Expected Outcomes  Achievement of increased cardiorespiratory fitness and enhanced flexibility, muscular endurance and strength shown through measurements of functional capacity and personal statement of participant.       Able to understand and use rate of perceived exertion (RPE) scale  Yes       Intervention  Provide education and explanation on how to use RPE scale       Expected Outcomes  Short Term: Able to use RPE daily in rehab to express subjective intensity level;Long Term:  Able to use RPE to guide intensity level when exercising independently       Able to understand and use Dyspnea scale  Yes       Intervention  Provide education and explanation on how to use Dyspnea scale       Expected Outcomes  Short Term: Able to use Dyspnea scale daily in rehab to express subjective sense of shortness of breath during exertion;Long Term: Able to use Dyspnea scale to guide intensity level when exercising independently       Knowledge and understanding of Target Heart Rate Range (THRR)  Yes       Intervention  Provide education and explanation of THRR including how the numbers were predicted and where they are located for reference       Expected Outcomes  Short Term: Able to state/look up THRR       Able to check pulse independently  Yes        Intervention  Provide education and demonstration on how to check pulse in carotid and radial arteries.;Review the importance of being able to check  your own pulse for safety during independent exercise       Expected Outcomes  Short Term: Able to explain why pulse checking is important during independent exercise;Long Term: Able to check pulse independently and accurately       Understanding of Exercise Prescription  Yes       Intervention  Provide education, explanation, and written materials on patient's individual exercise prescription       Expected Outcomes  Short Term: Able to explain program exercise prescription;Long Term: Able to explain home exercise prescription to exercise independently          Exercise Goals Re-Evaluation : Exercise Goals Re-Evaluation    Row Name 12/28/16 1444 01/12/17 0748 02/04/17 0802 03/08/17 1005 03/29/17 1408     Exercise Goal Re-Evaluation   Exercise Goals Review  Increase Physical Activity;Increase Strength and Stamina;Knowledge and understanding of Target Heart Rate Range (THRR)  Increase Physical Activity;Increase Strength and Stamina;Knowledge and understanding of Target Heart Rate Range (THRR)  Increase Physical Activity;Increase Strength and Stamina;Knowledge and understanding of Target Heart Rate Range (THRR)  Increase Strength and Stamina;Increase Physical Activity;Knowledge and understanding of Target Heart Rate Range (THRR)  Increase Physical Activity;Increase Strength and Stamina;Knowledge and understanding of Target Heart Rate Range (THRR)   Comments  Patient is doing well in CR.   Patient is doing well in CR. Patient stated that he currently walks for thirty minutes at a time when he is not at CR.   Patient is doing very well in CR. He continues to work out 3 days a week when he is not in CR.  Patient is doing well in CR. He admits to being active outside of class. He is maintaining his levels and watts on both machines and he states that this  program is helping him.   Patient is doing well in CR. He has maintained his level on the treadmill walking a 2.7 while increasing his SPMs on the Nustep. He has only one more visit in CR and he is interested in Maintenance program. He stil;l walks a lot throughout the week when not in CR.    Expected Outcomes  Patient is trying to get back into better shape and lose weight through program.   Patient wants to gain full mobility and to get back to driving   Goals are to get full mobility and back to driving   Patient wishes for full mobility and to get back to driving.   Patient wishes to gain more mobility and to get back to driving.       Discharge Exercise Prescription (Final Exercise Prescription Changes): Exercise Prescription Changes - 03/29/17 1400      Response to Exercise   Blood Pressure (Admit)  138/70    Blood Pressure (Exercise)  150/88    Blood Pressure (Exit)  112/70    Heart Rate (Admit)  62 bpm    Heart Rate (Exercise)  93 bpm    Heart Rate (Exit)  71 bpm    Rating of Perceived Exertion (Exercise)  14    Duration  Progress to 30 minutes of  aerobic without signs/symptoms of physical distress    Intensity  THRR New [phone]      Progression   Progression  Continue to progress workloads to maintain intensity without signs/symptoms of physical distress.      Resistance Training   Training Prescription  Yes    Weight  4    Reps  10-15      Treadmill  MPH  2.7    Grade  0    Minutes  20    METs  3      NuStep   Level  4    SPM  113    Minutes  15    METs  3.1      Home Exercise Plan   Plans to continue exercise at  Home (comment)    Frequency  Add 2 additional days to program exercise sessions.    Initial Home Exercises Provided  12/21/16       Nutrition:  Target Goals: Understanding of nutrition guidelines, daily intake of sodium '1500mg'$ , cholesterol '200mg'$ , calories 30% from fat and 7% or less from saturated fats, daily to have 5 or more servings of  fruits and vegetables.  Biometrics: Pre Biometrics - 12/17/16 0841      Pre Biometrics   Height  6' (1.829 m)    Weight  231 lb 14.8 oz (105.2 kg)    Waist Circumference  44 inches    Hip Circumference  46.5 inches    Waist to Hip Ratio  0.95 %    BMI (Calculated)  31.45    Triceps Skinfold  16 mm    % Body Fat  30.7 %    Grip Strength  83.2 kg    Flexibility  14 in    Single Leg Stand  9 seconds      Post Biometrics - 04/07/17 1356       Post  Biometrics   Height  6' (1.829 m)    Weight  238 lb 9.3 oz (108.2 kg)    Waist Circumference  45 inches    Hip Circumference  47 inches    Waist to Hip Ratio  0.96 %    BMI (Calculated)  32.35    Triceps Skinfold  15 mm    % Body Fat  31.2 %    Grip Strength  96.6 kg    Flexibility  12.33 in    Single Leg Stand  6 seconds       Nutrition Therapy Plan and Nutrition Goals: Nutrition Therapy & Goals - 01/18/17 0813      Personal Nutrition Goals   Nutrition Goal  For heart healthy choices add >50% of whole grains, make half their plate fruits and vegetables. Discuss the difference between starchy vegetables and leafy greens, and how leafy vegetables provide fiber, helps maintain healthy weight, helps control blood glucose, and lowers cholesterol.  Discuss purchasing fresh or frozen vegetable to reduce sodium and not to add grease, fat or sugar. Consume <18oz of red meat per week. Consume lean cuts of meats and very little of meats high in sodium and nitrates such as pork and lunch meats. Discussed portion control for all food groups      Intervention Plan   Intervention  Nutrition handout(s) given to patient.    Expected Outcomes  Short Term Goal: Understand basic principles of dietary content, such as calories, fat, sodium, cholesterol and nutrients.       Nutrition Discharge: Rate Your Plate Scores: Nutrition Assessments - 04/02/17 1517      MEDFICTS Scores   Pre Score  59    Post Score  27    Score Difference  -32        Nutrition Goals Re-Evaluation: Nutrition Goals Re-Evaluation    Row Name 02/10/17 1325 03/08/17 1419 03/31/17 1421         Goals   Current Weight  234  lb 9.6 oz (106.4 kg)  238 lb 1.6 oz (108 kg)  238 lb 1.6 oz (108 kg)     Nutrition Goal  For heart healthy choices add >50% of whole grains, make half their plate fruits and vegetables. Discuss the difference between starchy vegetables and leafy greens, and how leafy vegetables provide fiber, helps maintain healthy weight, helps control blood glucose, and lowers cholesterol.  Discuss purchasing fresh or frozen vegetable to reduce sodium and not to add grease, fat or sugar. Consume <18oz of red meat per week. Consume lean cuts of meats and very little of meats high in sodium and nitrates such as pork and lunch meats. Discussed portion control for all food groups.    For heart healthy choices add >50% of whole grains, make half their plate fruits and vegetables. Discuss the difference between starchy vegetables and leafy greens, and how leafy vegetables provide fiber, helps maintain healthy weight, helps control blood glucose, and lowers cholesterol.  Discuss purchasing fresh or frozen vegetable to reduce sodium and not to add grease, fat or sugar. Consume <18oz of red meat per week. Consume lean cuts of meats and very little of meats high in sodium and nitrates such as pork and lunch meats. Discussed portion control for all food groups.    For heart healthy choices add >50% of whole grains, make half their plate fruits and vegetables. Discuss the difference between starchy vegetables and leafy greens, and how leafy vegetables provide fiber, helps maintain healthy weight, helps control blood glucose, and lowers cholesterol.  Discuss purchasing fresh or frozen vegetable to reduce sodium and not to add grease, fat or sugar. Consume <18oz of red meat per week. Consume lean cuts of meats and very little of meats high in sodium and nitrates such as pork and  lunch meats. Discussed portion control for all food groups.     Comment  Patient has lost 3 lbs since last 30 Day ITP review. He says he has stopped eating ice cream at night and is trying to eat smaller portions and more vegetables and fruit. Will continue to monitor.   Patient says he is trying to meet his nutrition goal. He continues to eat ice cream which he blames on his inability to lose weight.   Patient says he is meeting his nutritional goals.      Expected Outcome  Patient will continue to work toward meeting his nutrition goals.   Patient will continue to meet his nutrition goals.   Patient will continue to meet his nutrition goals.         Nutrition Goals Discharge (Final Nutrition Goals Re-Evaluation): Nutrition Goals Re-Evaluation - 03/31/17 1421      Goals   Current Weight  238 lb 1.6 oz (108 kg)    Nutrition Goal  For heart healthy choices add >50% of whole grains, make half their plate fruits and vegetables. Discuss the difference between starchy vegetables and leafy greens, and how leafy vegetables provide fiber, helps maintain healthy weight, helps control blood glucose, and lowers cholesterol.  Discuss purchasing fresh or frozen vegetable to reduce sodium and not to add grease, fat or sugar. Consume <18oz of red meat per week. Consume lean cuts of meats and very little of meats high in sodium and nitrates such as pork and lunch meats. Discussed portion control for all food groups.    Comment  Patient says he is meeting his nutritional goals.     Expected Outcome  Patient will continue to meet  his nutrition goals.        Psychosocial: Target Goals: Acknowledge presence or absence of significant depression and/or stress, maximize coping skills, provide positive support system. Participant is able to verbalize types and ability to use techniques and skills needed for reducing stress and depression.  Initial Review & Psychosocial Screening: Initial Psych Review & Screening -  12/17/16 1134      Initial Review   Current issues with  None Identified      Family Dynamics   Good Support System?  Yes      Barriers   Psychosocial barriers to participate in program  There are no identifiable barriers or psychosocial needs.      Screening Interventions   Interventions  Encouraged to exercise       Quality of Life Scores: Quality of Life - 04/07/17 1357      Quality of Life Scores   Health/Function Pre  17.77 %    Health/Function Post  21.5 %    Health/Function % Change  20.99 %    Socioeconomic Pre  16.36 %    Socioeconomic Post  17.83 %    Socioeconomic % Change   8.99 %    Psych/Spiritual Pre  20.79 %    Psych/Spiritual Post  21.64 %    Psych/Spiritual % Change  4.09 %    Family Pre  22.2 %    Family Post  20.7 %    Family % Change  -6.76 %    GLOBAL Pre  18.75 %    GLOBAL Post  20.74 %    GLOBAL % Change  10.61 %       PHQ-9: Recent Review Flowsheet Data    Depression screen [facility] 2/9 04/02/2017 12/17/2016 09/08/2016 07/28/2016   Decreased Interest 0 - 0 0   Down, Depressed, Hopeless 1 - 0 0   PHQ - 2 Score 1 - 0 0   Altered sleeping 0 0 - -   Tired, decreased energy 1 0 - -   Change in appetite 0 0 - -   Feeling bad or failure about yourself  0 1 - -   Trouble concentrating 0 0 - -   Moving slowly or fidgety/restless 0 0 - -   Suicidal thoughts 0 0 - -   PHQ-9 Score 2 - - -   Difficult doing work/chores Not difficult at all Somewhat difficult - -     Interpretation of Total Score  Total Score Depression Severity:  1-4 = Minimal depression, 5-9 = Mild depression, 10-14 = Moderate depression, 15-19 = Moderately severe depression, 20-27 = Severe depression   Psychosocial Evaluation and Intervention: Psychosocial Evaluation - 04/12/17 1259      Discharge Psychosocial Assessment & Intervention   Comments  Patient has no psychososocial barriers identified at discharge. His exit QOL score improved by 3.76% at 20.74%. His exit PHQ-9 score was  2, initial was 1.        Psychosocial Re-Evaluation: Psychosocial Re-Evaluation    Row Name 01/13/17 1301 02/10/17 1331 03/08/17 1418 03/31/17 1421       Psychosocial Re-Evaluation   Current issues with  None Identified  None Identified  None Identified  None Identified    Expected Outcomes  Patient will have no psychosocial issues identified at discharge.   Patient will have no psychosocial issues identified at discharge.   Patient will have no psychosocial issues identified at discharge.   Patient will have no psychosocial barriers identified at discharge.  Interventions  Encouraged to attend Cardiac Rehabilitation for the exercise  Encouraged to attend Cardiac Rehabilitation for the exercise;Stress management education;Relaxation education  Encouraged to attend Cardiac Rehabilitation for the exercise;Stress management education;Relaxation education  Encouraged to attend Cardiac Rehabilitation for the exercise;Stress management education;Relaxation education    Continue Psychosocial Services   No Follow up required  No Follow up required  No Follow up required  No Follow up required       Psychosocial Discharge (Final Psychosocial Re-Evaluation): Psychosocial Re-Evaluation - 03/31/17 1421      Psychosocial Re-Evaluation   Current issues with  None Identified    Expected Outcomes  Patient will have no psychosocial barriers identified at discharge.     Interventions  Encouraged to attend Cardiac Rehabilitation for the exercise;Stress management education;Relaxation education    Continue Psychosocial Services   No Follow up required       Vocational Rehabilitation: Provide vocational rehab assistance to qualifying candidates.   Vocational Rehab Evaluation & Intervention: Vocational Rehab - 12/17/16 1130      Initial Vocational Rehab Evaluation & Intervention   Assessment shows need for Vocational Rehabilitation  No       Education: Education Goals: Education classes will be  provided on a weekly basis, covering required topics. Participant will state understanding/return demonstration of topics presented.  Learning Barriers/Preferences: Learning Barriers/Preferences - 12/17/16 1128      Learning Barriers/Preferences   Learning Barriers  None    Learning Preferences  Individual Instruction;Group Instruction;Skilled Demonstration       Education Topics: Hypertension, Hypertension Reduction -Define heart disease and high blood pressure. Discus how high blood pressure affects the body and ways to reduce high blood pressure.   CARDIAC REHAB PHASE II EXERCISE from 03/17/2017 in [place]  Date  02/17/17  Educator  DJ  Instruction Review Code  2- Demonstrated Understanding      Exercise and Your Heart -Discuss why it is important to exercise, the FITT principles of exercise, normal and abnormal responses to exercise, and how to exercise safely.   CARDIAC REHAB PHASE II EXERCISE from 03/17/2017 in [place]  Date  02/24/17  Educator  DJ  Instruction Review Code  2- Demonstrated Understanding      Angina -Discuss definition of angina, causes of angina, treatment of angina, and how to decrease risk of having angina.   CARDIAC REHAB PHASE II EXERCISE from 03/17/2017 in [place]  Date  03/03/17  Educator  D. Coad  Instruction Review Code  2- Demonstrated Understanding      Cardiac Medications -Review what the following cardiac medications are used for, how they affect the body, and side effects that may occur when taking the medications.  Medications include Aspirin, Beta blockers, calcium channel blockers, ACE Inhibitors, angiotensin receptor blockers, diuretics, digoxin, and antihyperlipidemics.   CARDIAC REHAB PHASE II EXERCISE from 03/17/2017 in [place]  Date  03/10/17  Educator  DJ  Instruction Review Code  2- Demonstrated Understanding      Congestive  Heart Failure -Discuss the definition of CHF, how to live with CHF, the signs and symptoms of CHF, and how keep track of weight and sodium intake.   CARDIAC REHAB PHASE II EXERCISE from 03/17/2017 in [place]  Date  03/17/17  Educator  DC  Instruction Review Code  2- Demonstrated Understanding      Heart Disease and Intimacy -Discus the effect sexual activity has on the heart, how changes  occur during intimacy as we age, and safety during sexual activity.   Smoking Cessation / COPD -Discuss different methods to quit smoking, the health benefits of quitting smoking, and the definition of COPD.   CARDIAC REHAB PHASE II EXERCISE from 03/17/2017 in [place]  Date  12/23/16  Educator  DJ  Instruction Review Code  2- Demonstrated Understanding      Nutrition I: Fats -Discuss the types of cholesterol, what cholesterol does to the heart, and how cholesterol levels can be controlled.   CARDIAC REHAB PHASE II EXERCISE from 03/17/2017 in [place]  Date  12/30/16  Educator  DC  Instruction Review Code  2- Demonstrated Understanding      Nutrition II: Labels -Discuss the different components of food labels and how to read food label   CARDIAC REHAB PHASE II EXERCISE from 03/17/2017 in [place]  Date  01/06/17  Educator  Dc  Instruction Review Code  2- Demonstrated Understanding      Heart Parts and Heart Disease -Discuss the anatomy of the heart, the pathway of blood circulation through the heart, and these are affected by heart disease.   CARDIAC REHAB PHASE II EXERCISE from 03/17/2017 in [place]  Date  01/13/17  Educator  DJ  Instruction Review Code  2- Demonstrated Understanding      Stress I: Signs and Symptoms -Discuss the causes of stress, how stress may lead to anxiety and depression, and ways to limit stress.   Stress II: Relaxation -Discuss  different types of relaxation techniques to limit stress.   CARDIAC REHAB PHASE II EXERCISE from 03/17/2017 in [place]  Date  01/27/17  Educator  DJ  Instruction Review Code  2- Demonstrated Understanding      Warning Signs of Stroke / TIA -Discuss definition of a stroke, what the signs and symptoms are of a stroke, and how to identify when someone is having stroke.   CARDIAC REHAB PHASE II EXERCISE from 03/17/2017 in [place]  Date  02/03/17  Educator  [place]  Instruction Review Code  2- Demonstrated Understanding      Knowledge Questionnaire Score: Knowledge Questionnaire Score - 04/02/17 1517      Knowledge Questionnaire Score   Pre Score  21/24    Post Score  22/24       Core Components/Risk Factors/Patient Goals at Admission: Personal Goals and Risk Factors at Admission - 12/17/16 1132      Core Components/Risk Factors/Patient Goals on Admission    Weight Management  Weight Maintenance    Improve shortness of breath with ADL's  Yes    Intervention  Provide education, individualized exercise plan and daily activity instruction to help decrease symptoms of SOB with activities of daily living.    Expected Outcomes  Short Term: Achieves a reduction of symptoms when performing activities of daily living.    Personal Goal Other  Yes    Personal Goal  Full mobility, get back to driving    Intervention  Attend CR 3 x week and supplement with 2 days of home exercise.     Expected Outcomes  Reach goals.        Core Components/Risk Factors/Patient Goals Review:  Goals and Risk Factor Review    Row Name 01/13/17 1256 02/10/17 1328 03/08/17 1411 03/31/17 1418 04/02/17 1519     Core Components/Risk Factors/Patient Goals Review   Personal Goals Review  Weight Management/Obesity;Improve shortness of breath  with ADL's Full mobility and drive again.   Weight Management/Obesity;Improve shortness of breath with ADL's Full mobility; back to  driving.  Weight Management/Obesity;Improve shortness of breath with ADL's Full mobility; drive again.  Weight Management/Obesity;Improve shortness of breath with ADL's Get full mobility back; drive again.  Weight Management/Obesity;Improve shortness of breath with ADL's Get full mobility back; drive again.   Review  Patient has completed 11 sessions gaining 5.4 lbs since he started the program. Patient was asked about his weight gain. He says he makes his own ice cream and he has been eating it every night. He attributes this to his gain. He has not attended the nutrition class but plans to attend the next class. He says he weighs himself everyday and is to call MD for a weight gain over 3 lbs/day or 5 lbs/wk. He says he does feel stronger and feels like his mobility is improving but has not started driving yet. His MD will release him to drive in December. Will continue to monitor.   Patient has completed 22 sessions. He continue to do well in the program. His life vest was removed 2 weeks ago. Patient says he will be able to drive 21/30/86. He says his mobility is getting better and he has more energy. He is walking on the days he is not in CR. He says he feels better overall since he started the program. Will continue to monitor.   Patient has completed 31 sessions gaining 4 lbs since last ITP. He continues to do well in the program progressing on the treadmill and nu-step. He says he feels much better and a lot stronger. He is walking on the days he is not in CR. He says has more energy to do things around the house and feels a lot stronger. Will continue to monitor.   Patient has completed 35 sessions gaining 4 lbs since his last ITP. He says the program has helped him meet his goals. He feels like his heart is stronger and he feels better overall since he started. He is able to do yard work now without fatigue or SOB and is exercising 3 days/week in addition to CR. He plans to join our forever fit  maintenance program to continue exercising.   Patient graduated completing 35 sessions gaining 6 lbs overall. He says the program has helped him meet his goals. He feels like his heart is stronger and he feels better overall since he started. He is able to do yard work now without fatigue or SOB and is exercising 3 days/week in addition to CR. His exit measurements improved in grip strenght and his walk test improved by 16.67%.  He plans to join our forever fit maintenance program to continue exercising.    Expected Outcomes  Patient will complete the program and continue to work toward meeting his personal goals.   Patient will complete the program and continue to meet his personal goals.   Patient will complete the program and continue to meet his personal goals.   Patient will continue exercising in our maintenance program and continue to meet his personal goals.   Patient plans to continue exercising in our forever fit maintenance program and continue to meet his personal goals.       Core Components/Risk Factors/Patient Goals at Discharge (Final Review):  Goals and Risk Factor Review - 04/02/17 1519      Core Components/Risk Factors/Patient Goals Review   Personal Goals Review  Weight Management/Obesity;Improve shortness of breath with  ADL's Get full mobility back; drive again.    Review  Patient graduated completing 35 sessions gaining 6 lbs overall. He says the program has helped him meet his goals. He feels like his heart is stronger and he feels better overall since he started. He is able to do yard work now without fatigue or SOB and is exercising 3 days/week in addition to CR. His exit measurements improved in grip strenght and his walk test improved by 16.67%.  He plans to join our forever fit maintenance program to continue exercising.     Expected Outcomes  Patient plans to continue exercising in our forever fit maintenance program and continue to meet his personal goals.        ITP  Comments: ITP Comments    Row Name 12/17/16 1523           ITP Comments  Patient new to program. Plans to start Monday 12/21/16.          Comments: Patient graduated from [facility] today on 03/19/2017 after completing 35 sessions. He achieved LTG of 30 minutes of aerobic exercise at [facility] Met level of 4.0. All patients vitals are WNL. Patient has met with dietician. Discharge instruction has been reviewed in detail and patient stated an understanding of material given. Patient plans to join our forever fit maintenance program to keep exercising. Cardiac Rehab staff will make f/u calls at 1 month, 6 months, and 1 year. Patient had no complaints of any abnormal S/S or pain on their exit visit.

## 2017-04-15 NOTE — Addendum Note (Signed)
Encounter addended by: Suzanne Boron on: 04/15/2017 12:41 PM  Actions taken: Flowsheet data copied forward, Flowsheet accepted

## 2017-04-19 ENCOUNTER — Telehealth: Payer: Self-pay | Admitting: Cardiology

## 2017-04-19 NOTE — Telephone Encounter (Signed)
Please talk with the patient first to find out what is going on from a clinical perspective.

## 2017-04-19 NOTE — Telephone Encounter (Signed)
Attempt to reach, got voicemail.I will forward to [place] for his impression.

## 2017-04-19 NOTE — Telephone Encounter (Signed)
Pt called stating he's having problems swallowing and problems w/ a cough-- thinks it may be related to his meds atorvastatin (LIPITOR) 20 MG tablet [[id number]] & metoprolol tartrate (LOPRESSOR) 25 MG tablet [[id number]]. Also, having problems processing things when he's talking to people.    please give pt a call @ [phone]

## 2017-04-22 NOTE — Telephone Encounter (Signed)
Spoke with pt who states that during the 2nd week in Dec. He began to have difficulty swallowing and developed a cough at that time. Pt states that he feels this is coming from side effects of Lipitor or Lopressor. Pt started on Lopressor in June 2018. Pt listed that he was taking Lipitor on 10/29/16 office visit. Pt does not c/o SOB at this time. Pt instructed to call PCP office.

## 2017-05-04 NOTE — Progress Notes (Signed)
Cardiology Office Note  Date: 05/06/2017   ID: HENRIK ORIHUELA, DOB 1945/10/24, MRN [id number]  PCP: [name], MD  Primary Cardiologist: Rozann Lesches, MD   Chief Complaint  Patient presents with  . Valvular heart disease  . Coronary Artery Disease    History of Present Illness: Charles [name] is a medically complex 72 y.o. male last seen in October 2018. He presents today for a routine follow-up visit. Overall, continues to do very well. He completed cardiac rehabilitation and is now exercising at the [facility]. He is trying to work up to 4-5 days a week. He reports NYHA class II dyspnea, no exertional chest pain or palpitations.  We discussed his medications which are outlined below. He reports compliance, no obvious intolerances. He will be having a physical with Dr. Nevada Crane soon and have lab work obtained for follow-up lipids.  Follow-up echocardiogram from October is outlined below. He is status post aortic root replacement with allograft, mean AV gradient was 3 mmHg.  He also tells me about some other symptoms that he has been experiencing. He has had some hearing changes and tinnitus, underwent audiology evaluation. Also having intermittent coughing and what sounds like possibly reflux with aspiration.  Past Medical History:  Diagnosis Date  . Anemia   . Aortic insufficiency    a. 07/07/2016: TEE showing bicuspid aortic valve with severe eccentric AI   . Ascending aortic aneurysm (HCC)    4.4 cm by CT 06/2016  . 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 Bicuspid aortic valve   . Chronic periodontitis   . Coronary artery disease   . Pneumonia 1978  . 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 S/P patent foramen ovale closure 10/02/2016    Past Surgical History:  Procedure Laterality Date  . AORTIC VALVE REPLACEMENT N/A 10/02/2016   Procedure: AORTIC ROOT REPLACEMENT  - using 59mm Allograft;  Surgeon: [name], MD;  Location: [facility];  Service: Open Heart Surgery;  Laterality: N/A;  . CORONARY ARTERY BYPASS GRAFT N/A 10/02/2016   Procedure: CORONARY ARTERY BYPASS GRAFTING (CABG) x one, using right leg greater saphenous vein harvested endoscopically;  Surgeon: [name], MD;  Location: [facility];  Service: Open Heart Surgery;  Laterality: N/A;  . FALSE ANEURYSM REPAIR  10/02/2016   Procedure: REPAIR FALSE ANEURYSM of Aortic Root Resection and Grafting of Aortic Root;  Surgeon: [name], MD;  Location: [facility];  Service: Open Heart Surgery;;  . HERNIA REPAIR Left   . LEFT HEART CATH AND CORS/GRAFTS ANGIOGRAPHY N/A 10/07/2016   Procedure: Left Heart Cath and Cors/Grafts Angiography;  Surgeon: Burnell Blanks, MD;  Location: [facility] [facility] CV [facility];  Service: Cardiovascular;  Laterality: N/A;  . MULTIPLE EXTRACTIONS WITH ALVEOLOPLASTY N/A 08/13/2016   Procedure: Extraction of tooth #'s 2,4,14 with alveoloplasty;  Surgeon: Lenn Cal, DDS;  Location: [facility];  Service: Oral Surgery;  Laterality: N/A;  . PATENT FORAMEN OVALE(PFO) CLOSURE N/A 10/02/2016   Procedure: PATENT FORAMEN OVALE (PFO) CLOSURE;  Surgeon: [name], MD;  Location: [facility];  Service: Open Heart Surgery;  Laterality: N/A;  . RIGHT/LEFT HEART CATH  AND CORONARY ANGIOGRAPHY N/A 08/03/2016   Procedure: Right/Left Heart Cath and Coronary Angiography;  Surgeon: Burnell Blanks, MD;  Location: [facility] CV LAB;  Service: Cardiovascular;  Laterality: N/A;  . TEE WITHOUT CARDIOVERSION N/A 07/07/2016   Procedure: TRANSESOPHAGEAL ECHOCARDIOGRAM (TEE);  Surgeon: Larey Dresser, MD;  Location: [facility];  Service: Cardiovascular;  Laterality: N/A;  . TEE WITHOUT  CARDIOVERSION N/A 10/02/2016   Procedure: TRANSESOPHAGEAL ECHOCARDIOGRAM (TEE);  Surgeon: [name], MD;  Location: [facility];  Service: Open Heart Surgery;  Laterality: N/A;  . THORACIC AORTIC ANEURYSM REPAIR N/A 10/02/2016   Procedure: THORACIC ASCENDING ANEURYSM REPAIR  - using 7mm Allograft;  Reimplantation of Left Coronary Button;  Surgeon: [name], MD;  Location: [facility];  Service: Open Heart Surgery;  Laterality: N/A;    Current Outpatient Medications  Medication Sig Dispense Refill  . aspirin EC 81 MG tablet Take 81 mg by mouth daily.    Marland Kitchen atorvastatin (LIPITOR) 20 MG tablet Take 1 tablet (20 mg total) by mouth daily. 90 tablet 1  . furosemide (LASIX) 20 MG tablet Take 20 mg by mouth daily.    . IRON PO Take 1 tablet by mouth daily. OTC iron    . metoprolol tartrate (LOPRESSOR) 25 MG tablet Take 1 tablet (25 mg total) by mouth 2 (two) times daily. Hold for HR less than 50 180 tablet 1  . Multiple Vitamin (MULTIVITAMIN WITH MINERALS) TABS tablet Take 1 tablet by mouth daily.    . potassium chloride (K-DUR) 10 MEQ tablet Take 1 tablet (10 mEq total) by mouth daily. 90 tablet 3   No current facility-administered medications for this visit.    Allergies:  No known allergies   Social History: The patient  reports that he quit smoking about 26 years ago. His smoking use included cigarettes. he has never used smokeless tobacco. He reports that he does not drink alcohol or use drugs.   ROS:  Please see the history of present illness. Otherwise, complete review of systems is positive for none.  All other systems are reviewed and negative.   Physical Exam: VS:  BP 114/64 (BP Location: Left Arm)   Pulse 63   Ht 6' (1.829 m)   Wt 247 lb (112 kg)   SpO2 93%   BMI 33.50 kg/m , BMI Body mass index is 33.5 kg/m.  Wt Readings from Last 3 Encounters:  05/06/17 247 lb (112 kg)  04/07/17 238 lb 9.3 oz (108.2 kg)  02/02/17 238 lb (108 kg)    General: Patient appears comfortable at  rest. HEENT: Conjunctiva and lids normal, oropharynx clear. Neck: Supple, no elevated JVP or carotid bruits, no thyromegaly. Lungs: Clear to auscultation, nonlabored breathing at rest. Cardiac: Regular rate and rhythm, no S3, 2/6 systolic murmur, no pericardial rub. Abdomen: Soft, nontender, bowel sounds present. Extremities: Trace ankle edema, distal pulses 2+. Skin: Warm and dry. Musculoskeletal: No kyphosis. Neuropsychiatric: Alert and oriented x3, affect grossly appropriate.  ECG: I personally reviewed the tracing from 02/01/2017 which showed sinus rhythm with prolonged PR interval, left anterior fascicular block, increased voltage and repolarization abnormalities.  Recent Labwork: 07/01/2016: TSH 1.300 09/24/2016: B Natriuretic Peptide 253.0 10/07/2016: Magnesium 2.7 10/08/2016: ALT 14; AST 13 10/13/2016: Hemoglobin 8.3; Platelets 369 10/14/2016: BUN 18; Creatinine, Ser 1.42; Potassium 4.1; Sodium 140     Component Value Date/Time   CHOL 122 07/03/2016 0715   TRIG 125 07/03/2016 0715   HDL 16 (L) 07/03/2016 0715   CHOLHDL 7.6 07/03/2016  0715   VLDL 25 07/03/2016 0715   LDLCALC 81 07/03/2016 0715    Other Studies Reviewed Today:  Cardiac catheterization 10/07/2016:  Prox Cx to Mid Cx lesion, 30 %stenosed.  Mid LAD lesion, 20 %stenosed.  Ost 2nd Diag lesion, 20 %stenosed.  Dist LAD lesion, 40 %stenosed.  Ost RCA lesion, 100 %stenosed.  SVG graft was visualized by angiography and is very large and anatomically normal.  1. 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 Patent SVG to PDA. The entire RCA fills from the patent vein graft.  3. Mild disease in the Circumflex and LAD. No change since last cath.  4. Normal LVEDP  Echocardiogram 02/08/2017: Study Conclusions  - Left ventricle: Hypokinesis of the basal inferolateral wall. The   cavity size was normal. Wall thickness was increased in a pattern   of moderate LVH.  Systolic function was vigorous. The estimated   ejection fraction was in the range of 65% to 70%. - Aortic valve: There was mild regurgitation.  Assessment and Plan:  1. History of severe aortic regurgitation and bacterial endocarditis status post aortic root replacement with allograft in association with single vessel CABG and PFO closure. He is doing well, now exercising on his own at the [facility] LVEF is normal range by  Echocardiogram in October 2018 and AV mean gradient was normal.  2. CAD status post CABG with SVG to the RCA. Continue aspirin and statin. Should have follow-up lipid panel and LFTs with Dr. [place] Crane at physical.  3. History of cardiomyopathy with normalization of LVEF.  4. Intermittent cough. Possibly reflux symptoms, he will try an over-the-counter PPI to see if this makes a difference. If not and symptoms worsen, he may need further workup/referral.  Current medicines were reviewed with the patient today.  Disposition: Follow-up in 6 months.  Signed, Satira Sark, MD, [facility] 05/06/2017 11:42 AM    [facility]. [address], [place], [place] [address] Phone: [phone]; Fax: [phone]

## 2017-05-06 ENCOUNTER — Ambulatory Visit: Payer: Medicare Other | Admitting: Cardiology

## 2017-05-06 ENCOUNTER — Encounter: Payer: Self-pay | Admitting: Cardiology

## 2017-05-06 VITALS — BP 114/64 | HR 63 | Ht 72.0 in | Wt 247.0 lb

## 2017-05-06 DIAGNOSIS — Z954 Presence of other heart-valve replacement: Secondary | ICD-10-CM

## 2017-05-06 DIAGNOSIS — I25119 Atherosclerotic heart disease of native coronary artery with unspecified angina pectoris: Secondary | ICD-10-CM | POA: Diagnosis not present

## 2017-05-06 DIAGNOSIS — Z8679 Personal history of other diseases of the circulatory system: Secondary | ICD-10-CM

## 2017-05-06 DIAGNOSIS — R059 Cough, unspecified: Secondary | ICD-10-CM

## 2017-05-06 DIAGNOSIS — R05 Cough: Secondary | ICD-10-CM

## 2017-05-06 MED ORDER — POTASSIUM CHLORIDE ER 10 MEQ PO TBCR: 10.0000 meq | EXTENDED_RELEASE_TABLET | Freq: Every day | ORAL | 3 refills | Status: DC

## 2017-05-06 NOTE — Patient Instructions (Addendum)
Your physician wants you to follow-up in:6 months  with Dr.McDowell You will receive a reminder letter in the mail two months in advance. If you don't receive a letter, please call our office to schedule the follow-up appointment.     Take potassium 10 meq daily, all other medications stay the same.    If you need a refill on your cardiac medications before your next appointment, please call your pharmacy.       No lab work or tests ordered today.       Thank you for choosing [facility] !

## 2017-05-14 ENCOUNTER — Telehealth: Payer: Self-pay | Admitting: Cardiology

## 2017-05-14 NOTE — Telephone Encounter (Signed)
Returned pt call. Patient is questioning whether or not there are any guidelines for patients with his heart problems that state if going in the "steam room" or "sauna" at his local [employer] is dangerous. He also asks if there are any specific amounts of time that he should be in those. Please advise.

## 2017-05-14 NOTE — Telephone Encounter (Signed)
Please give pt a call he has some questions

## 2017-05-14 NOTE — Telephone Encounter (Signed)
Called pt. Informed him of this information. He voiced understanding.

## 2017-05-14 NOTE — Telephone Encounter (Signed)
There are not clear-cut recommendations about this.  Temperature and humidity extremes can be dangerous to patients with unstable cardiac disease including ischemia and arrhythmias.  In someone who is doing well however with no angina on medical therapy, controlled blood pressure, and in an adequately hydrated state, limited use of these types of facilities (10 minutes or less at a time) would not be unreasonable.

## 2017-05-25 DIAGNOSIS — H5203 Hypermetropia, bilateral: Secondary | ICD-10-CM | POA: Diagnosis not present

## 2017-07-12 ENCOUNTER — Telehealth: Payer: Self-pay | Admitting: Cardiology

## 2017-07-12 NOTE — Telephone Encounter (Signed)
Returned pt's call. He stated that at last office visit he had discussed coming off of his cholesterol medication or at least decreasing it. He states that he has not had any recent lipids done. I advised him to call his pcp to see when he is due to have follow up labs. He agrees with plan and will return call once he has had labs done.

## 2017-07-12 NOTE — Telephone Encounter (Signed)
has a question concerning his cholesterol medication please call @ [phone]

## 2017-07-15 ENCOUNTER — Other Ambulatory Visit: Payer: Self-pay | Admitting: Cardiology

## 2017-07-22 DIAGNOSIS — E782 Mixed hyperlipidemia: Secondary | ICD-10-CM | POA: Diagnosis not present

## 2017-07-22 DIAGNOSIS — I719 Aortic aneurysm of unspecified site, without rupture: Secondary | ICD-10-CM | POA: Diagnosis not present

## 2017-07-26 DIAGNOSIS — Z952 Presence of prosthetic heart valve: Secondary | ICD-10-CM | POA: Diagnosis not present

## 2017-07-26 DIAGNOSIS — R001 Bradycardia, unspecified: Secondary | ICD-10-CM | POA: Diagnosis not present

## 2017-07-26 DIAGNOSIS — I119 Hypertensive heart disease without heart failure: Secondary | ICD-10-CM | POA: Diagnosis not present

## 2017-07-26 DIAGNOSIS — I420 Dilated cardiomyopathy: Secondary | ICD-10-CM | POA: Diagnosis not present

## 2017-08-09 ENCOUNTER — Telehealth: Payer: Self-pay

## 2017-08-09 ENCOUNTER — Encounter: Payer: Self-pay | Admitting: Internal Medicine

## 2017-08-09 NOTE — Telephone Encounter (Signed)
-----   Message from Carlis Stable, NP sent at 08/06/2017  4:30 PM EDT ----- He is medically complex and would likely benefit from an OV to review his progress and any symptoms that could complicate his procedure.  Eric  ----- Message ----- From: Claudina Lick, LPN Sent: 04/15/1592   8:25 AM To: Carlis Stable, NP  Randall Hiss, this is the pt that I spoke with you about yesterday. Can you review his chart and let me know if he needs an OV. He is only on aspirin but he just had open heart surgery in June of 2018.

## 2017-08-09 NOTE — Telephone Encounter (Signed)
OV made and letter mailed °

## 2017-08-09 NOTE — Telephone Encounter (Signed)
Please schedule ov. See paper referral from [place].

## 2017-09-05 ENCOUNTER — Encounter (HOSPITAL_COMMUNITY): Payer: Self-pay | Admitting: Emergency Medicine

## 2017-09-05 ENCOUNTER — Other Ambulatory Visit: Payer: Self-pay

## 2017-09-05 ENCOUNTER — Emergency Department (HOSPITAL_COMMUNITY)
Admission: EM | Admit: 2017-09-05 | Discharge: 2017-09-05 | Disposition: A | Discharge status: Home / Self Care | Payer: Medicare Other | Attending: Emergency Medicine | Admitting: Emergency Medicine

## 2017-09-05 ENCOUNTER — Emergency Department (HOSPITAL_COMMUNITY): Payer: Medicare Other

## 2017-09-05 DIAGNOSIS — I509 Heart failure, unspecified: Secondary | ICD-10-CM | POA: Diagnosis not present

## 2017-09-05 DIAGNOSIS — M19011 Primary osteoarthritis, right shoulder: Secondary | ICD-10-CM | POA: Insufficient documentation

## 2017-09-05 DIAGNOSIS — Z7982 Long term (current) use of aspirin: Secondary | ICD-10-CM | POA: Insufficient documentation

## 2017-09-05 DIAGNOSIS — Z951 Presence of aortocoronary bypass graft: Secondary | ICD-10-CM | POA: Insufficient documentation

## 2017-09-05 DIAGNOSIS — Z87891 Personal history of nicotine dependence: Secondary | ICD-10-CM | POA: Insufficient documentation

## 2017-09-05 DIAGNOSIS — Z79899 Other long term (current) drug therapy: Secondary | ICD-10-CM | POA: Insufficient documentation

## 2017-09-05 DIAGNOSIS — I2510 Atherosclerotic heart disease of native coronary artery without angina pectoris: Secondary | ICD-10-CM | POA: Insufficient documentation

## 2017-09-05 DIAGNOSIS — M25511 Pain in right shoulder: Secondary | ICD-10-CM | POA: Diagnosis not present

## 2017-09-05 DIAGNOSIS — M1990 Unspecified osteoarthritis, unspecified site: Secondary | ICD-10-CM | POA: Diagnosis not present

## 2017-09-05 NOTE — ED Triage Notes (Signed)
Rt shoulder pain x 1 week, denies any injury

## 2017-09-05 NOTE — ED Provider Notes (Signed)
[facility] EMERGENCY DEPARTMENT Provider Note   CSN: [id number] Arrival date & time: 09/05/17  1410     History   Chief Complaint Chief Complaint  Patient presents with  . Shoulder Pain    HPI Charles [name] is a 72 y.o. male with past medical history significant for CAD, anemia and a aortic aneurysm with surgical repair presenting with right shoulder pain x1 week.  He denies specific injury but reports he has been lifting weights including upper body exercises that target his shoulders and upper back.  His pain is worsened with certain movements including reaching for the left shoulder and any overhead ROM. Pain also with palpation.  He has been treating his pain with Tylenol which offers adequate relief.  Is no radiation of pain, he denies chest pain, shortness of breath or any other symptoms including weakness or numbness in his arms or hands.  The history is provided by the patient.    Past Medical History:  Diagnosis Date  . Anemia   . Aortic insufficiency    a. 07/07/2016: TEE showing bicuspid aortic valve with severe eccentric AI   . Ascending aortic aneurysm (HCC)    4.4 cm by CT 06/2016  . 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 Bicuspid aortic valve   . Chronic periodontitis   . Coronary artery disease   . Pneumonia 1978  . 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 S/P patent foramen ovale closure 10/02/2016    Patient Active Problem List   Diagnosis Date Noted  . CHF (congestive heart failure) ([place])   . S/P AVR   . Acute blood loss anemia   . Tachypnea   . Ventricular tachycardia ([place])   . S/P aortic root replacement with human allograft + CABG x1  10/02/2016  . 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 S/P patent foramen ovale closure 10/02/2016  . S/P aortic valve allograft 10/02/2016  . Insomnia 09/27/2016  . Acute diastolic CHF (congestive heart failure), NYHA class 4 ([place]) 09/25/2016  . CHF (congestive heart failure), NYHA class II ([place]) 09/24/2016  . Aortic valve disorder   . Coronary artery disease involving native heart without angina pectoris   . Second degree AV block, Mobitz type I 07/08/2016  . Weight loss   . Bacterial endocarditis   . Protein-calorie malnutrition, severe 07/03/2016  . Anemia 07/02/2016  . H/O cardiac murmur 07/02/2016  . Ascending aortic aneurysm ([place]) 07/02/2016  . NSTEMI (non-ST elevated myocardial infarction) ([place]) 07/02/2016  . Bicuspid aortic valve 07/02/2016  . Aortic insufficiency 07/02/2016    Past Surgical History:  Procedure Laterality Date  . AORTIC VALVE REPLACEMENT N/A 10/02/2016   Procedure: AORTIC ROOT REPLACEMENT  - using 46mm Allograft;  Surgeon: [name], MD;  Location: [place];  Service: Open Heart Surgery;  Laterality: N/A;  . CORONARY ARTERY BYPASS GRAFT N/A 10/02/2016   Procedure: CORONARY ARTERY BYPASS GRAFTING (CABG) x one, using right leg greater saphenous vein harvested endoscopically;  Surgeon: [name], MD;  Location: [place];  Service: Open Heart Surgery;  Laterality: N/A;  . FALSE ANEURYSM REPAIR  10/02/2016   Procedure: REPAIR FALSE ANEURYSM of Aortic  Root Resection and Grafting of Aortic Root;  Surgeon: [name], MD;  Location: [place];  Service: Open Heart Surgery;;  . HERNIA REPAIR Left   . LEFT HEART CATH AND CORS/GRAFTS ANGIOGRAPHY N/A 10/07/2016   Procedure: Left Heart Cath and Cors/Grafts Angiography;  Surgeon: Burnell Blanks, MD;  Location: [place] CV [facility];  Service: Cardiovascular;  Laterality: N/A;  . MULTIPLE EXTRACTIONS WITH ALVEOLOPLASTY N/A 08/13/2016   Procedure: Extraction of tooth #'s 2,4,14 with alveoloplasty;  Surgeon: Lenn Cal,  DDS;  Location: [place];  Service: Oral Surgery;  Laterality: N/A;  . PATENT FORAMEN OVALE(PFO) CLOSURE N/A 10/02/2016   Procedure: PATENT FORAMEN OVALE (PFO) CLOSURE;  Surgeon: [name], MD;  Location: [place];  Service: Open Heart Surgery;  Laterality: N/A;  . RIGHT/LEFT HEART CATH AND CORONARY ANGIOGRAPHY N/A 08/03/2016   Procedure: Right/Left Heart Cath and Coronary Angiography;  Surgeon: Burnell Blanks, MD;  Location: [place] [facility];  Service: Cardiovascular;  Laterality: N/A;  . TEE WITHOUT CARDIOVERSION N/A 07/07/2016   Procedure: TRANSESOPHAGEAL ECHOCARDIOGRAM (TEE);  Surgeon: Larey Dresser, MD;  Location: [place];  Service: Cardiovascular;  Laterality: N/A;  . TEE WITHOUT CARDIOVERSION N/A 10/02/2016   Procedure: TRANSESOPHAGEAL ECHOCARDIOGRAM (TEE);  Surgeon: [name], MD;  Location: [place];  Service: Open Heart Surgery;  Laterality: N/A;  . THORACIC AORTIC ANEURYSM REPAIR N/A 10/02/2016   Procedure: THORACIC ASCENDING ANEURYSM REPAIR  - using 22mm Allograft;  Reimplantation of Left Coronary Button;  Surgeon: [name], MD;  Location: [place];  Service: Open Heart Surgery;  Laterality: N/A;        Home Medications    Prior to Admission medications   Medication Sig Start Date End Date Taking? Authorizing Provider  aspirin EC 81 MG tablet Take 81 mg by mouth daily.    [provider]  atorvastatin (LIPITOR) 20 MG tablet TAKE 1 TABLET(20 MG) BY MOUTH DAILY 07/15/17   Satira Sark, MD  furosemide (LASIX) 20 MG tablet Take 20 mg by mouth daily.    [provider]  IRON PO Take 1 tablet by mouth daily. OTC iron    [provider]  metoprolol tartrate (LOPRESSOR) 25 MG tablet TAKE 1 TABLET BY MOUTH TWICE DAILY. HOLD FOR HR LESS THAN 50 07/15/17   Satira Sark, MD  Multiple Vitamin (MULTIVITAMIN WITH MINERALS) TABS tablet Take 1 tablet by mouth daily.    [provider]  potassium chloride (K-DUR) 10 MEQ tablet Take  1 tablet (10 mEq total) by mouth daily. 05/06/17 08/04/17  Satira Sark, MD    Family History Family History  Problem Relation Age of Onset  . Ovarian cancer Mother   . Heart disease Father 42       Died of coronary thrombosis.      Social History Social History   Tobacco Use  . Smoking status: Former Smoker    Types: Cigarettes    Last attempt to quit: 05/01/1991    Years since quitting: 26.3  . Smokeless tobacco: Never Used  . Tobacco comment: Quit 30 years ago.    Substance Use Topics  . Alcohol use: No  . Drug use: No     Allergies   No known allergies   Review of Systems Review of Systems  Constitutional: Negative for fever.  Musculoskeletal: Positive for arthralgias. Negative for joint swelling and myalgias.  Neurological: Negative for weakness and numbness.     Physical Exam Updated Vital  Signs BP 136/86 (BP Location: Right Arm)   Pulse 63   Temp 98.1 F (36.7 C) (Oral)   Resp 16   Ht 6' (1.829 m)   Wt 111.1 kg (245 lb)   SpO2 96%   BMI 33.23 kg/m   Physical Exam  Constitutional: He appears well-developed and well-nourished.  HENT:  Head: Atraumatic.  Neck: Normal range of motion.  Cardiovascular:  Pulses equal bilaterally  Musculoskeletal: He exhibits tenderness.       Right shoulder: He exhibits bony tenderness. He exhibits no crepitus and no deformity.  Patient is point tender to palpation over his right [facility] joint which does appear slightly prominent in comparison to the left.  There is no skin changes, erythema, rash.  Pain is worsened with anterior flexion and abduction.  Neurological: He is alert. He has normal strength. He displays normal reflexes. No sensory deficit.  Skin: Skin is warm and dry.  Psychiatric: He has a normal mood and affect.     ED Treatments / Results  Labs (all labs ordered are listed, but only abnormal results are displayed) Labs Reviewed - No data to display  EKG None  Radiology Dg Shoulder  Right  Result Date: 09/05/2017 CLINICAL DATA:  Pain in the region of the right [facility] joint for the past week. No known injury. EXAM: RIGHT SHOULDER - 2+ VIEW COMPARISON:  None. FINDINGS: Moderate superior and mild inferior acromioclavicular joint spur formation. Otherwise, unremarkable bones. Small amount of distal rotator cuff calcification. IMPRESSION: 1. AC joint degenerative spur formation, as described above. 2. Mild distal rotator cuff calcific tendinitis. Electronically Signed   By: Claudie Revering M.D.   On: 09/05/2017 16:07    Procedures Procedures (including critical care time)  Medications Ordered in ED Medications - No data to display   Initial Impression / Assessment and Plan / ED Course  I have reviewed the triage vital signs and the nursing notes.  Pertinent labs & imaging results that were available during my care of the patient were reviewed by me and considered in my medical decision making (see chart for details).     Imaging reviewed and discussed with patient including acute diagnosis of AC joint arthritis and how this may affect the rotator cuff.  He was advised activity as tolerated, no overhead use, especially weight lifting exercise involving this movement.  Continue Tylenol, discussed heat therapy.  Patient can follow-up with his PCP, but ultimately may need orthopedic referral as well.  He was given several names for this, PRN follow-up anticipated.  Final Clinical Impressions(s) / ED Diagnoses   Final diagnoses:  Arthritis of right acromioclavicular joint    ED Discharge Orders    None       Landis Martins 09/05/17 1707    Milton Ferguson, MD 09/05/17 2334

## 2017-09-05 NOTE — ED Notes (Signed)
To rad 

## 2017-09-05 NOTE — ED Notes (Signed)
JI in to assess 

## 2017-09-05 NOTE — Discharge Instructions (Addendum)
You may continue using your arthritis strength tylenol 650 mg every 6 hours if you have pain.  A heating pad applied for 20 minutes 3 times daily is also recommended.   Follow up as instructed if your symptoms persist or worsen.

## 2017-09-08 ENCOUNTER — Telehealth: Payer: Self-pay | Admitting: Cardiology

## 2017-09-08 NOTE — Telephone Encounter (Signed)
Called pharmacy- verbally changes dosage from 40 mg daily to 20 mg daily (lasix)

## 2017-09-08 NOTE — Telephone Encounter (Signed)
Pt states that his pharmacy still has his furosemide (LASIX) 20 MG tablet [[id number]]  As 40mg  instead of 20mg  and he'd like that fixed please cause he's needing a refill

## 2017-09-26 DIAGNOSIS — J9811 Atelectasis: Secondary | ICD-10-CM | POA: Diagnosis not present

## 2017-09-27 ENCOUNTER — Encounter: Payer: Self-pay | Admitting: Thoracic Surgery (Cardiothoracic Vascular Surgery)

## 2017-09-28 DIAGNOSIS — R05 Cough: Secondary | ICD-10-CM | POA: Diagnosis not present

## 2017-09-28 DIAGNOSIS — N179 Acute kidney failure, unspecified: Secondary | ICD-10-CM | POA: Diagnosis not present

## 2017-09-28 DIAGNOSIS — N183 Chronic kidney disease, stage 3 (moderate): Secondary | ICD-10-CM | POA: Diagnosis not present

## 2017-09-28 DIAGNOSIS — I130 Hypertensive heart and chronic kidney disease with heart failure and stage 1 through stage 4 chronic kidney disease, or unspecified chronic kidney disease: Secondary | ICD-10-CM | POA: Diagnosis not present

## 2017-09-28 DIAGNOSIS — M79604 Pain in right leg: Secondary | ICD-10-CM | POA: Diagnosis not present

## 2017-09-28 DIAGNOSIS — E785 Hyperlipidemia, unspecified: Secondary | ICD-10-CM | POA: Diagnosis not present

## 2017-09-28 DIAGNOSIS — E876 Hypokalemia: Secondary | ICD-10-CM | POA: Diagnosis not present

## 2017-09-28 DIAGNOSIS — M7989 Other specified soft tissue disorders: Secondary | ICD-10-CM | POA: Diagnosis not present

## 2017-09-28 DIAGNOSIS — R918 Other nonspecific abnormal finding of lung field: Secondary | ICD-10-CM | POA: Diagnosis not present

## 2017-09-28 DIAGNOSIS — L539 Erythematous condition, unspecified: Secondary | ICD-10-CM | POA: Diagnosis not present

## 2017-09-28 DIAGNOSIS — I071 Rheumatic tricuspid insufficiency: Secondary | ICD-10-CM | POA: Diagnosis not present

## 2017-09-28 DIAGNOSIS — L03115 Cellulitis of right lower limb: Secondary | ICD-10-CM | POA: Diagnosis not present

## 2017-09-28 DIAGNOSIS — I2510 Atherosclerotic heart disease of native coronary artery without angina pectoris: Secondary | ICD-10-CM | POA: Diagnosis not present

## 2017-09-28 DIAGNOSIS — I5030 Unspecified diastolic (congestive) heart failure: Secondary | ICD-10-CM | POA: Diagnosis not present

## 2017-09-29 DIAGNOSIS — I071 Rheumatic tricuspid insufficiency: Secondary | ICD-10-CM | POA: Diagnosis not present

## 2017-09-29 DIAGNOSIS — L03115 Cellulitis of right lower limb: Secondary | ICD-10-CM | POA: Diagnosis not present

## 2017-09-30 DIAGNOSIS — L03115 Cellulitis of right lower limb: Secondary | ICD-10-CM | POA: Diagnosis not present

## 2017-10-01 DIAGNOSIS — L03115 Cellulitis of right lower limb: Secondary | ICD-10-CM | POA: Diagnosis not present

## 2017-10-02 DIAGNOSIS — L03115 Cellulitis of right lower limb: Secondary | ICD-10-CM | POA: Diagnosis not present

## 2017-10-03 DIAGNOSIS — L03115 Cellulitis of right lower limb: Secondary | ICD-10-CM | POA: Diagnosis not present

## 2017-10-04 DIAGNOSIS — L03115 Cellulitis of right lower limb: Secondary | ICD-10-CM | POA: Diagnosis not present

## 2017-10-05 DIAGNOSIS — L03115 Cellulitis of right lower limb: Secondary | ICD-10-CM | POA: Diagnosis not present

## 2017-10-05 DIAGNOSIS — N179 Acute kidney failure, unspecified: Secondary | ICD-10-CM | POA: Diagnosis not present

## 2017-10-11 ENCOUNTER — Encounter: Payer: Self-pay | Admitting: Thoracic Surgery (Cardiothoracic Vascular Surgery)

## 2017-10-13 ENCOUNTER — Other Ambulatory Visit: Payer: Self-pay | Admitting: Cardiology

## 2017-10-15 ENCOUNTER — Other Ambulatory Visit: Payer: Self-pay | Admitting: Internal Medicine

## 2017-10-15 DIAGNOSIS — L0390 Cellulitis, unspecified: Secondary | ICD-10-CM | POA: Diagnosis not present

## 2017-10-15 DIAGNOSIS — M25579 Pain in unspecified ankle and joints of unspecified foot: Secondary | ICD-10-CM | POA: Diagnosis not present

## 2017-10-15 DIAGNOSIS — M79604 Pain in right leg: Secondary | ICD-10-CM

## 2017-10-15 DIAGNOSIS — Z6833 Body mass index (BMI) 33.0-33.9, adult: Secondary | ICD-10-CM | POA: Diagnosis not present

## 2017-10-15 DIAGNOSIS — M79606 Pain in leg, unspecified: Secondary | ICD-10-CM | POA: Diagnosis not present

## 2017-10-20 ENCOUNTER — Ambulatory Visit (HOSPITAL_COMMUNITY): Payer: Medicare Other

## 2017-10-26 ENCOUNTER — Encounter: Payer: Self-pay | Admitting: Nurse Practitioner

## 2017-10-26 ENCOUNTER — Ambulatory Visit (INDEPENDENT_AMBULATORY_CARE_PROVIDER_SITE_OTHER): Payer: Medicare Other | Admitting: Nurse Practitioner

## 2017-10-26 ENCOUNTER — Telehealth: Payer: Self-pay

## 2017-10-26 DIAGNOSIS — L03115 Cellulitis of right lower limb: Secondary | ICD-10-CM

## 2017-10-26 DIAGNOSIS — L0390 Cellulitis, unspecified: Secondary | ICD-10-CM | POA: Insufficient documentation

## 2017-10-26 DIAGNOSIS — I509 Heart failure, unspecified: Secondary | ICD-10-CM

## 2017-10-26 NOTE — Assessment & Plan Note (Addendum)
Significant cardiac history recently including hospitalization in June 2018 for CABG, valvular repair due to endocarditis, congestive heart failure.  His last echo was completed mid 2018 which noted normal EF 65-70%.  He does not have any ongoing cardiac symptoms at this time.  He is on daily aspirin.  No other anticoagulants.  No obvious contraindications for colonoscopy at this time and we will proceed accordingly.  Proceed with TCS with Dr. Gala Romney in near future: the risks, benefits, and alternatives have been discussed with the patient in detail. The patient states understanding and desires to proceed.  The patient is not on any anticoagulants, anxiolytics, chronic pain medications, or antidepressants.  Conscious sedation should be adequate for his procedure.

## 2017-10-26 NOTE — Progress Notes (Signed)
CC'ED TO PCP 

## 2017-10-26 NOTE — Patient Instructions (Signed)
1. We will schedule your colonoscopy for you. 2. We will contact your primary care doctor to discuss if they feel your cellulitis is resolved to where we can schedule your colonoscopy. 3. We will call you 2 weeks prior to your schedule colonoscopy to check and see if your cellulitis is improved. 4. Further recommendations will be made after your colonoscopy. 5. Return for follow-up based on recommendations made after your colonoscopy. 6. Call us if you have any questions or concerns.  At [facility] we value your feedback. You may receive a survey about your visit today. Please share your experience as we strive to create trusting relationships with our patients to provide genuine, compassionate, quality care.  It was great to meet you today!  I hope you have a great summer!!

## 2017-10-26 NOTE — Telephone Encounter (Signed)
Spoke with staff of Allyn Kenner MD, and they will have a nurse give [place] a call after pts appointment on 11/03/17 to give an update on the cellulitis/clearance for pts TCS.

## 2017-10-26 NOTE — Assessment & Plan Note (Signed)
The patient just completed treatment for right lower extremity cellulitis.  He states the area is still sore.  The area appears to have some scaling/peeling skin.  He has a follow-up with his primary care who is been managing his treatment.  At this point we will proceed with scheduling colonoscopy will but will put off the date to be "nonurgent" to allow further healing time for cellulitis.  We will try to contact his primary care to get clearance as well.  I will plan to have the staff contact the patient 2 weeks prior to his scheduled colonoscopy date to see if his cellulitis is resolved.

## 2017-10-26 NOTE — Progress Notes (Signed)
Primary Care Physician:  [name], MD Primary Gastroenterologist:  Dr. Gala Romney  Chief Complaint  Patient presents with  . Colonoscopy    never had tcs    HPI:   Charles [name] is a 72 y.o. male who presents on referral from primary care for consideration of colonoscopy.  He was recommended for an office visit due to medical complexity and open heart surgery in June 2018.  Reviewed information provided with the referral including office visit dated 07/26/2017.  This exam was for a physical.  History of mild iron deficiency anemia.  Noted to be due for colonoscopy and he was subsequently referred to our office.  No history of colonoscopy in our system.  Today he states.  The patient states he has never had a colonoscopy before. He had open chart surgery in June 2018. He had a valve replacement due to endocarditis. He just finished antibiotics for RLE cellulitis. Denies abdominal pain, N/V. Hasn't looked at his stools so unsure if hematochezia or melena. Denies fever, chills, unintentional weight loss, acute changes in bowel habits. He is noting some less regularity in his bowel movement. Denies chest pain, dyspnea, dizziness, lightheadedness, syncope, near syncope. Denies any other upper or lower GI symptoms.  Past Medical History:  Diagnosis Date  . Anemia   . Aortic insufficiency    a. 07/07/2016: TEE showing bicuspid aortic valve with severe eccentric AI   . Ascending aortic aneurysm (HCC)    4.4 cm by CT 06/2016  . 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 Bicuspid aortic valve   . Chronic periodontitis   . Coronary artery disease   . Pneumonia 1978  . 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 S/P patent foramen ovale closure 10/02/2016    Past Surgical History:  Procedure Laterality Date  . AORTIC VALVE REPLACEMENT N/A 10/02/2016   Procedure: AORTIC ROOT REPLACEMENT  - using 59mm Allograft;  Surgeon: [name], MD;  Location: [place];  Service: Open Heart Surgery;  Laterality: N/A;  . CORONARY ARTERY BYPASS GRAFT N/A 10/02/2016   Procedure: CORONARY ARTERY BYPASS GRAFTING (CABG) x one, using right leg greater saphenous vein harvested endoscopically;  Surgeon: [name], MD;  Location: [place];  Service: Open Heart Surgery;  Laterality: N/A;  . FALSE ANEURYSM REPAIR  10/02/2016   Procedure: REPAIR FALSE ANEURYSM of Aortic Root Resection and Grafting of Aortic Root;  Surgeon: [name], MD;  Location: [place];  Service: Open Heart Surgery;;  . HERNIA REPAIR Left   . LEFT HEART CATH AND CORS/GRAFTS ANGIOGRAPHY N/A 10/07/2016   Procedure: Left Heart Cath and Cors/Grafts Angiography;  Surgeon: Burnell Blanks, MD;  Location: [place] CV [facility];  Service: Cardiovascular;  Laterality: N/A;  . MULTIPLE EXTRACTIONS WITH ALVEOLOPLASTY N/A 08/13/2016   Procedure: Extraction of tooth #'s 2,4,14 with alveoloplasty;  Surgeon: Lenn Cal, DDS;  Location: [place];  Service: Oral Surgery;  Laterality: N/A;  . PATENT FORAMEN OVALE(PFO) CLOSURE N/A 10/02/2016   Procedure: PATENT FORAMEN OVALE (PFO) CLOSURE;  Surgeon: [name], MD;  Location: [place];  Service: Open Heart Surgery;  Laterality: N/A;  .  RIGHT/LEFT HEART CATH AND CORONARY ANGIOGRAPHY N/A 08/03/2016   Procedure: Right/Left Heart Cath and Coronary Angiography;  Surgeon: Burnell Blanks, MD;  Location: [place] [facility];  Service: Cardiovascular;  Laterality: N/A;  . TEE WITHOUT CARDIOVERSION N/A 07/07/2016   Procedure: TRANSESOPHAGEAL ECHOCARDIOGRAM (TEE);  Surgeon: Larey Dresser, MD;  Location: [place];  Service: Cardiovascular;  Laterality: N/A;  . TEE WITHOUT CARDIOVERSION  N/A 10/02/2016   Procedure: TRANSESOPHAGEAL ECHOCARDIOGRAM (TEE);  Surgeon: [name], MD;  Location: [place];  Service: Open Heart Surgery;  Laterality: N/A;  . THORACIC AORTIC ANEURYSM REPAIR N/A 10/02/2016   Procedure: THORACIC ASCENDING ANEURYSM REPAIR  - using 35mm Allograft;  Reimplantation of Left Coronary Button;  Surgeon: [name], MD;  Location: [place];  Service: Open Heart Surgery;  Laterality: N/A;    Current Outpatient Medications  Medication Sig Dispense Refill  . aspirin EC 81 MG tablet Take 81 mg by mouth daily.    Marland Kitchen atorvastatin (LIPITOR) 20 MG tablet TAKE 1 TABLET(20 MG) BY MOUTH DAILY 90 tablet 3  . furosemide (LASIX) 20 MG tablet Take 20 mg by mouth daily.    . IRON PO Take 1 tablet by mouth daily. OTC iron    . metoprolol tartrate (LOPRESSOR) 25 MG tablet TAKE 1 TABLET BY MOUTH TWICE DAILY. HOLD FOR HR LESS THAN 50 180 tablet 2  . Multiple Vitamin (MULTIVITAMIN WITH MINERALS) TABS tablet Take 1 tablet by mouth daily.    . nitroGLYCERIN (NITROSTAT) 0.4 MG SL tablet Place 1 tablet (0.4 mg total) under the tongue every 5 (five) minutes x 3 doses as needed for chest pain (if no relief after 3rd dose, proceed to the ED for an evaluation). 25 tablet 3  . potassium chloride (K-DUR) 10 MEQ tablet Take 1 tablet (10 mEq total) by mouth daily. 90 tablet 3   No current facility-administered medications for this visit.     Allergies as of 10/26/2017 - Review Complete 10/26/2017  Allergen Reaction Noted  . No known allergies  07/28/2016    Family History  Problem Relation Age of Onset  . Ovarian cancer Mother   . Heart disease Father 93       Died of coronary thrombosis.    . Colon cancer Neg Hx     Social History   Socioeconomic History  . Marital status: Widowed    Spouse name: Not on file  . Number of children: 3  . Years of education: Not on file  . Highest education level: Not on file  Occupational History  . Not on file  Social Needs  . Financial  resource strain: Not on file  . Food insecurity:    Worry: Not on file    Inability: Not on file  . Transportation needs:    Medical: Not on file    Non-medical: Not on file  Tobacco Use  . Smoking status: Former Smoker    Types: Cigarettes    Last attempt to quit: 05/01/1991    Years since quitting: 26.5  . Smokeless tobacco: Never Used  . Tobacco comment: Quit 30 years ago.    Substance and Sexual Activity  . Alcohol use: No  . Drug use: No  . Sexual activity: Not on file  Lifestyle  . Physical activity:    Days per week: Not on file    Minutes per session: Not on file  . Stress: Not on file  Relationships  . Social connections:    Talks on  phone: Not on file    Gets together: Not on file    Attends religious service: Not on file    Active member of club or organization: Not on file    Attends meetings of clubs or organizations: Not on file    Relationship status: Not on file  . Intimate partner violence:    Fear of current or ex partner: Not on file    Emotionally abused: Not on file    Physically abused: Not on file    Forced sexual activity: Not on file  Other Topics Concern  . Not on file  Social History Narrative  . Not on file    Review of Systems: General: Negative for anorexia, weight loss, fever, chills, fatigue, weakness. ENT: Negative for hoarseness, difficulty swallowing , nasal congestion. CV: Negative for chest pain, angina, palpitations, dyspnea on exertion, peripheral edema.  Respiratory: Negative for dyspnea at rest, dyspnea on exertion, cough, sputum, wheezing.  GI: See history of present illness. MS: Negative for joint pain, low back pain.  Derm: Negative for rash or itching.  Endo: Negative for unusual weight change.  Heme: Negative for bruising or bleeding. Allergy: Negative for rash or hives.    Physical Exam: BP 138/79   Pulse 67   Temp (!) 97.4 F (36.3 C) (Oral)   Ht 6' (1.829 m)   Wt 247 lb 3.2 oz (112.1 kg)   BMI 33.53 kg/m    General:   Alert and oriented. Pleasant and cooperative. Well-nourished and well-developed.  Eyes:  Without icterus, sclera clear and conjunctiva pink.  Ears:  Normal auditory acuity. Cardiovascular:  S1, S2 present without murmurs appreciated. Extremities without clubbing or edema. Respiratory:  Clear to auscultation bilaterally. No wheezes, rales, or rhonchi. No distress.  Gastrointestinal:  +BS, soft, non-tender and non-distended. No HSM noted. No guarding or rebound. No masses appreciated.  Rectal:  Deferred  Musculoskalatal:  Symmetrical without gross deformities. Skin: Scaling skin and mild edema with tenderness RLE with history of cellulitis just completed treatment. Neurologic:  Alert and oriented x4;  grossly normal neurologically. Psych:  Alert and cooperative. Normal mood and affect. Heme/Lymph/Immune: No excessive bruising noted.    10/26/2017 9:48 AM   Disclaimer: This note was dictated with voice recognition software. Similar sounding words can inadvertently be transcribed and may not be corrected upon review.

## 2017-10-28 ENCOUNTER — Ambulatory Visit (HOSPITAL_COMMUNITY): Payer: Medicare Other

## 2017-10-28 ENCOUNTER — Encounter (HOSPITAL_COMMUNITY): Payer: Self-pay

## 2017-10-28 NOTE — Telephone Encounter (Signed)
Great, thanks

## 2017-10-29 DIAGNOSIS — Z0000 Encounter for general adult medical examination without abnormal findings: Secondary | ICD-10-CM | POA: Diagnosis not present

## 2017-11-01 ENCOUNTER — Other Ambulatory Visit: Payer: Self-pay

## 2017-11-01 ENCOUNTER — Encounter: Payer: Self-pay | Admitting: Thoracic Surgery (Cardiothoracic Vascular Surgery)

## 2017-11-01 ENCOUNTER — Ambulatory Visit: Payer: Medicare Other | Admitting: Thoracic Surgery (Cardiothoracic Vascular Surgery)

## 2017-11-01 VITALS — BP 116/72 | HR 64 | Resp 16 | Ht 72.0 in | Wt 245.0 lb

## 2017-11-01 DIAGNOSIS — Z954 Presence of other heart-valve replacement: Secondary | ICD-10-CM

## 2017-11-01 DIAGNOSIS — Z951 Presence of aortocoronary bypass graft: Secondary | ICD-10-CM

## 2017-11-01 DIAGNOSIS — Z8774 Personal history of (corrected) congenital malformations of heart and circulatory system: Secondary | ICD-10-CM

## 2017-11-01 NOTE — Patient Instructions (Signed)
Continue all previous medications without any changes at this time  Make every effort to stay physically active, get some type of exercise on a regular basis, and stick to a "heart healthy diet".  The long term benefits for regular exercise and a healthy diet are critically important to your overall health and wellbeing.  Endocarditis is a potentially serious infection of heart valves or inside lining of the heart.  It occurs more commonly in patients with diseased heart valves (such as patient's with aortic or mitral valve disease) and in patients who have undergone heart valve repair or replacement.  Certain surgical and dental procedures may put you at risk, such as dental cleaning, other dental procedures, or any surgery involving the respiratory, urinary, gastrointestinal tract, gallbladder or prostate gland.   To minimize your chances for develooping endocarditis, maintain good oral health and seek prompt medical attention for any infections involving the mouth, teeth, gums, skin or urinary tract.    Always notify your doctor or dentist about your underlying heart valve condition before having any invasive procedures. You will need to take antibiotics before certain procedures, including all routine dental cleanings or other dental procedures.  Your cardiologist or dentist should prescribe these antibiotics for you to be taken ahead of time.      

## 2017-11-01 NOTE — Progress Notes (Signed)
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 Diagnostic cardiac catheterization revealed widely patent saphenous vein graft to the posterior descending coronary artery with no other significant coronary artery disease, and the patient's VT was felt likely related to the patient's recent complex surgery involving the aortic root. Ultimately episodes of VT subsided with use of mexiletine and amiodarone, and the patient was followed closely by Dr. Rayann Heman and colleagues from the EP service. The patient was ultimately discharged home with a LifeVest in place on 10/15/2016. Since then he has done remarkably well.  He was seen in follow-up in our office on November 23, 2016.  Since then he has been followed closely both by Dr. Rayann Heman and Dr. Domenic Polite.  He had no further episodes of VT and he declined an offer to consider EPS or empiric ICD placement.  Follow-up echocardiogram performed February 08, 2017 revealed normal left ventricular systolic function with ejection fraction  estimated 65 to 70%.  Human allograft aortic root was functioning normally with mild aortic insufficiency and no significant transvalvular gradient.  He was seen in follow-up by Dr. Domenic Polite in January of this year.  Since then the patient has done well from a cardiac standpoint.  He was hospitalized with cellulitis in his right lower leg in May.  He returns for office today for routine follow-up and reports that other than the problems with cellulitis in his leg he has been doing well.  Overall he feels "much improved" in comparison with how he felt prior to surgery.  He denies any significant symptoms of exertional shortness of breath.  He states that he only gets short of breath with very strenuous exertion.  He has no chest pain or no chest discomfort either with activity or at rest.  Overall he is quite pleased with his progress although he remains concerned about his recent bout of cellulitis in his leg and the fact that he still has some swelling.   Current Outpatient Medications  Medication Sig Dispense Refill  . aspirin EC 81 MG tablet Take 81 mg by mouth daily.    Marland Kitchen atorvastatin (LIPITOR) 20 MG tablet TAKE 1 TABLET(20 MG) BY MOUTH DAILY 90 tablet 3  . furosemide (LASIX) 20 MG tablet Take 20 mg by mouth daily.    . IRON PO Take 1 tablet by mouth daily. OTC iron    . loratadine-pseudoephedrine (CLARITIN-D 24-HOUR) 10-240 MG 24 hr tablet Take 1 tablet by mouth daily.    . metoprolol tartrate (LOPRESSOR) 25 MG tablet TAKE 1 TABLET BY MOUTH TWICE DAILY. HOLD FOR HR LESS THAN 50 180  tablet 2  . Multiple Vitamin (MULTIVITAMIN WITH MINERALS) TABS tablet Take 1 tablet by mouth daily.    . nitroGLYCERIN (NITROSTAT) 0.4 MG SL tablet Place 1 tablet (0.4 mg total) under the tongue every 5 (five) minutes x 3 doses as needed for chest pain (if no relief after 3rd dose, proceed to the ED for an evaluation). 25 tablet 3  . potassium chloride (K-DUR) 10 MEQ tablet Take 1 tablet (10 mEq total) by mouth daily. 90  tablet 3   No current facility-administered medications for this visit.       Physical Exam:   BP 116/72 (BP Location: Right Arm, Patient Position: Sitting, Cuff Size: Large)   Pulse 64   Resp 16   Ht 6' (1.829 m)   Wt 245 lb (111.1 kg)   SpO2 95% Comment: ON RA  BMI 33.23 kg/m   General:  Well-appearing  Chest:   Clear to auscultation  CV:   Regular rate and rhythm without murmur  Incisions:  Completely healed  Abdomen:  Soft nontender  Extremities:  Warm and well-perfused, mild to moderate right lower leg edema with no active cellulitis  Diagnostic Tests:  Transthoracic Echocardiography  Patient:    Charles [name], Charles [name] MR #:       [id number] Study Date: 02/08/2017 Gender:     M Age:        50 Height:     182.9 cm Weight:     108 kg BSA:        2.37 m^2 Pt. Status: Room:   ATTENDING    Rozann Lesches, M.D.  ORDERING     Rozann Lesches, M.D.  REFERRING    Rozann Lesches, M.D.  SONOGRAPHER  [facility]  PERFORMING   [facility], [facility]  cc:  ------------------------------------------------------------------- LV EF: 65% -   70%  ------------------------------------------------------------------- Indications:      Cardiomyopathy - ischemic 414.8.  ------------------------------------------------------------------- History:   PMH:  Bacterial Endocarditis PFO Closure. S/P aortic root replacement with allograft 10/02/2016  25 mm human allograft aortic root graft with reimplantation of left main coronary artery, repair of false aneurysms of aortic root x2 due to root abscess, and replacement of ascending thoracic aortic aneurysm  ------------------------------------------------------------------- Study Conclusions  - Left ventricle: Hypokinesis of the basal inferolateral wall. The   cavity size was normal. Wall thickness was increased in a pattern   of moderate LVH. Systolic function was vigorous. The estimated   ejection fraction was in the range  of 65% to 70%. - Aortic valve: There was mild regurgitation.  ------------------------------------------------------------------- Study data:  Comparison was made to the study of 10/06/2016.  Study status:  Routine.  Procedure:  Transthoracic echocardiography. Image quality was adequate.          Transthoracic echocardiography.  M-mode, complete 2D, spectral Doppler, and color Doppler.  Birthdate:  Patient birthdate: May 03, 1945.  Age:  Patient is 72 yr old.  Sex:  Gender: male.    BMI: 32.3 kg/m^2.  Blood pressure:     153/84  Patient status:  Outpatient.  Study date: Study date: 02/08/2017. Study time: 09:41 AM.  Location:  Echo laboratory.  -------------------------------------------------------------------  ------------------------------------------------------------------- Left ventricle:  Hypokinesis of the basal inferolateral wall. The cavity size was normal. Wall thickness was increased in a pattern of moderate LVH. Systolic function was vigorous. The estimated ejection fraction was in the range of 65% to 70%.  ------------------------------------------------------------------- Aortic valve:   Mildly thickened leaflets.  Doppler:  There was mild regurgitation.  VTI ratio of LVOT to aortic valve: 0.64. Valve area (VTI): 2.43 cm^2. Indexed valve area (VTI): 1.02 cm^2/m^2. Peak velocity ratio of LVOT to aortic valve: 0.62. Valve area (Vmax): 2.36 cm^2. Indexed valve area (Vmax): 0.99 cm^2/m^2. Mean velocity ratio of LVOT to aortic valve: 0.59. Valve area (Vmean): 2.24 cm^2. Indexed valve area (Vmean): 0.95 cm^2/m^2. Mean gradient (S): 3 mm Hg. Peak gradient (S): 7 mm Hg.  ------------------------------------------------------------------- Aorta:  Aortic root is normal 35 mm  ------------------------------------------------------------------- Mitral valve:   Structurally normal valve.   Leaflet separation was normal.  Doppler:  Transvalvular velocity was within the  normal range. There was no evidence for stenosis. There was no regurgitation.    Peak gradient (D): 3 mm Hg.  ------------------------------------------------------------------- Left atrium:  The atrium was normal in size.  ------------------------------------------------------------------- Right ventricle:  The cavity size was normal. Wall thickness was normal. Systolic function was normal.  ------------------------------------------------------------------- Pulmonic valve:    Structurally normal valve.   Cusp separation was normal.  Doppler:  Transvalvular velocity was within the normal range. There was no regurgitation.  ------------------------------------------------------------------- Tricuspid valve:   Structurally normal valve.   Leaflet separation was normal.  Doppler:  Transvalvular velocity was within the normal range. There was trivial regurgitation.  ------------------------------------------------------------------- Right atrium:  The atrium was normal in size.  ------------------------------------------------------------------- Pericardium:  There was no pericardial effusion.  ------------------------------------------------------------------- Systemic veins: Inferior vena cava: The vessel was normal in size. The respirophasic diameter changes were in the normal range (>= 50%), consistent with normal central venous pressure.  ------------------------------------------------------------------- Post procedure conclusions Ascending Aorta:  - Aortic root is normal 35 mm  ------------------------------------------------------------------- Measurements   Left ventricle                           Value          Reference  LV ID, ED, PLAX chordal                  47.9  mm       43 - 52  LV ID, ES, PLAX chordal                  34.2  mm       23 - 38  LV fx shortening, PLAX chordal           29    %        >=29  LV PW thickness, ED                       15.1  mm       ----------  IVS/LV PW ratio, ED                      0.91           <=1.3  Stroke volume, 2D                        62    ml       ----------  Stroke volume/bsa, 2D                    26    ml/m^2   ----------  LV e&', lateral                           11.1  cm/s     ----------  LV E/e&', lateral                         7.77           ----------  LV e&', medial                            5.55  cm/s     ----------  LV E/e&', medial                          15.55          ----------  LV e&', average                           8.33  cm/s     ----------  LV E/e&', average                         10.37          ----------    Ventricular septum                       Value          Reference  IVS thickness, ED                        13.7  mm       ----------    LVOT                                     Value          Reference  LVOT ID, S                               22    mm       ----------  LVOT area                                3.8   cm^2     ----------  LVOT peak velocity, S                    79.5  cm/s     ----------  LVOT mean velocity, S                    50.4  cm/s     ----------  LVOT VTI, S                              16.3  cm       ----------  LVOT peak gradient, S                    3     mm Hg    ----------    Aortic valve                             Value          Reference  Aortic valve peak  velocity, S            128   cm/s     ----------  Aortic valve mean velocity, S            85.4  cm/s     ----------  Aortic valve VTI, S                      25.5  cm       ----------  Aortic mean gradient, S                  3     mm Hg    ----------  Aortic peak gradient, S                  7     mm Hg    ----------  VTI ratio, LVOT/AV                       0.64           ----------  Aortic valve area, VTI                   2.43  cm^2     ----------  Aortic valve area/bsa, VTI               1.02  cm^2/m^2 ----------  Velocity ratio, peak, LVOT/AV            0.62            ----------  Aortic valve area, peak velocity         2.36  cm^2     ----------  Aortic valve area/bsa, peak              0.99  cm^2/m^2 ----------  velocity  Velocity ratio, mean, LVOT/AV            0.59           ----------  Aortic valve area, mean velocity         2.24  cm^2     ----------  Aortic valve area/bsa, mean              0.95  cm^2/m^2 ----------  velocity    Aorta                                    Value          Reference  Aortic root ID, ED                       33    mm       ----------    Left atrium                              Value          Reference  LA ID, A-P, ES                           49    mm       ----------  LA ID/bsa, A-P                           2.07  cm/m^2   <=  2.2  LA volume, S                             53.1  ml       ----------  LA volume/bsa, S                         22.4  ml/m^2   ----------  LA volume, ES, 1-p A4C                   55.7  ml       ----------  LA volume/bsa, ES, 1-p A4C               23.5  ml/m^2   ----------  LA volume, ES, 1-p A2C                   50.1  ml       ----------  LA volume/bsa, ES, 1-p A2C               21.1  ml/m^2   ----------    Mitral valve                             Value          Reference  Mitral E-wave peak velocity              86.3  cm/s     ----------  Mitral A-wave peak velocity              67.9  cm/s     ----------  Mitral deceleration time         (H)     313   ms       150 - 230  Mitral peak gradient, D                  3     mm Hg    ----------  Mitral E/A ratio, peak                   1.3            ----------    Pulmonary arteries                       Value          Reference  PA pressure, S, DP                       12    mm Hg    <=30    Tricuspid valve                          Value          Reference  Tricuspid regurg peak velocity           152   cm/s     ----------  Tricuspid peak RV-RA gradient            9     mm Hg    ----------    Right atrium                             Value  Reference  RA ID, S-I, ES, A4C              (H)     51.2  mm       34 - 49  RA area, ES, A4C                 (H)     21.6  cm^2     8.3 - 19.5  RA volume, ES, A/L                       75.6  ml       ----------  RA volume/bsa, ES, A/L                   31.9  ml/m^2   ----------    Systemic veins                           Value          Reference  Estimated CVP                            3     mm Hg    ----------    Right ventricle                          Value          Reference  TAPSE                                    20.7  mm       ----------  RV pressure, S, DP                       12    mm Hg    <=30  RV s&', lateral, S                        10.7  cm/s     ----------  Legend: (L)  and  (H)  mark values outside specified reference range.  ------------------------------------------------------------------- Prepared and Electronically Authenticated by  Dorris Carnes, M.D. 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  Plan:  We have not recommended any change the patient's current medications.  I have encouraged the patient to continue to increase his activity without any particular limitations at this time.  The patient has been reminded regarding the importance of dental hygiene and the lifelong need for antibiotic prophylaxis for all dental cleanings and other related invasive procedures.  He will continue to follow-up periodically with Dr. Domenic Polite and return to our office in the future only should specific problems or questions arise.  All questions answered.   I spent in excess of 15 minutes during the conduct of this office consultation and >50% of this time  involved direct face-to-face encounter with the patient for counseling and/or coordination of their care.    Valentina Gu. Roxy Manns, MD 11/01/2017 1:23 PM

## 2017-11-10 ENCOUNTER — Ambulatory Visit (HOSPITAL_COMMUNITY)
Admission: RE | Admit: 2017-11-10 | Discharge: 2017-11-10 | Disposition: A | Discharge status: Home / Self Care | Payer: Medicare Other | Source: Ambulatory Visit | Attending: Internal Medicine | Admitting: Internal Medicine

## 2017-11-10 DIAGNOSIS — L03115 Cellulitis of right lower limb: Secondary | ICD-10-CM | POA: Diagnosis not present

## 2017-11-10 DIAGNOSIS — M7989 Other specified soft tissue disorders: Secondary | ICD-10-CM | POA: Diagnosis not present

## 2017-11-10 DIAGNOSIS — M79661 Pain in right lower leg: Secondary | ICD-10-CM | POA: Diagnosis not present

## 2017-11-10 DIAGNOSIS — M79604 Pain in right leg: Secondary | ICD-10-CM

## 2017-11-10 DIAGNOSIS — M869 Osteomyelitis, unspecified: Secondary | ICD-10-CM | POA: Diagnosis not present

## 2017-11-25 ENCOUNTER — Encounter: Payer: Self-pay | Admitting: *Deleted

## 2017-11-25 ENCOUNTER — Other Ambulatory Visit: Payer: Self-pay | Admitting: *Deleted

## 2017-11-25 DIAGNOSIS — Z1211 Encounter for screening for malignant neoplasm of colon: Secondary | ICD-10-CM

## 2017-11-25 MED ORDER — PEG 3350-KCL-NA BICARB-NACL 420 G PO SOLR: 4000.0000 mL | Freq: Once | ORAL | 0 refills | Status: AC

## 2017-11-25 NOTE — Telephone Encounter (Signed)
Yes, hold iron 7 days prior to colonoscopy

## 2017-11-25 NOTE — Telephone Encounter (Signed)
Patient called back. He is scheduled for TCS 12/22/17 at 2:00pm. Prep sent into the pharmacy. Eric, patient is on iron, does he need to hold this? Thanks

## 2017-11-25 NOTE — Telephone Encounter (Signed)
LMOVM

## 2017-11-25 NOTE — Telephone Encounter (Signed)
Instructions mailed to patient.

## 2017-11-25 NOTE — Telephone Encounter (Signed)
Elmo Putt, have you received any clearance on patient yet? thanks

## 2017-11-25 NOTE — Telephone Encounter (Signed)
Called Dr. Durene Cal office. Ok per Dr. [place] Crane to schedule procedure. Clearance has been been.

## 2017-12-08 ENCOUNTER — Telehealth: Payer: Self-pay

## 2017-12-08 NOTE — Telephone Encounter (Signed)
Pt called office and LMOVM requesting another set of TCS instructions be mailed. He also didn't know what Bisacdoyl tablets were. Tried to call pt, no answer, LMOVM and informed him Bisacodyl tablets are Dulcolax and can be purchased OTC. Instructions mailed.

## 2017-12-15 NOTE — Progress Notes (Signed)
Cardiology Office Note  Date: 12/16/2017   ID: Charles [name], DOB 10-09-1945, MRN [id number]  PCP: [name], MD  Primary Cardiologist: Rozann Lesches, MD   Chief Complaint  Patient presents with  . Valvular heart disease    History of Present Illness: Charles [name] is a medically complex 72 y.o. male last seen in January.  He presents for a follow-up visit.  His wife has passed away since I saw him, he seems to be doing reasonably well, tells me that he lives with his son and their family, has been trying to stay busy.  He has not been exercising as much as he was before, but does plan to get back to the [facility] on a more typical basis.  Right now he makes it there may be once or twice a week.  He does not report any angina symptoms, has had no palpitations or progressive shortness of breath.  No fevers or chills.  I reviewed his cardiac regimen which is outlined below.  He reports compliance.  States that he had lab work with Dr. Nevada Crane in the last few months which we will request for review.  Cardiac testing from last year is outlined below.   Past Medical History:  Diagnosis Date  . Anemia   . Aortic insufficiency    a. 07/07/2016: TEE showing bicuspid aortic valve with severe eccentric AI   . Ascending aortic aneurysm (HCC)    4.4 cm by CT 06/2016  . 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 Bicuspid aortic valve   . Chronic periodontitis   . Coronary artery disease   . Pneumonia 1978  . 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 S/P patent foramen ovale closure 10/02/2016    Past Surgical History:  Procedure Laterality Date  . AORTIC VALVE REPLACEMENT N/A  10/02/2016   Procedure: AORTIC ROOT REPLACEMENT  - using 26mm Allograft;  Surgeon: [name], MD;  Location: [place];  Service: Open Heart Surgery;  Laterality: N/A;  . CORONARY ARTERY BYPASS GRAFT N/A 10/02/2016   Procedure: CORONARY ARTERY BYPASS GRAFTING (CABG) x one, using right leg greater saphenous vein harvested endoscopically;  Surgeon: [name], MD;  Location: [place];  Service: Open Heart Surgery;  Laterality: N/A;  . FALSE ANEURYSM REPAIR  10/02/2016   Procedure: REPAIR FALSE ANEURYSM of Aortic Root Resection and Grafting of Aortic Root;  Surgeon: [name], MD;  Location: [place];  Service: Open Heart Surgery;;  . HERNIA REPAIR Left   . LEFT HEART CATH AND CORS/GRAFTS ANGIOGRAPHY N/A 10/07/2016   Procedure: Left Heart Cath and Cors/Grafts Angiography;  Surgeon: Burnell Blanks, MD;  Location: [place] [facility];  Service: Cardiovascular;  Laterality: N/A;  . MULTIPLE EXTRACTIONS WITH ALVEOLOPLASTY N/A 08/13/2016   Procedure: Extraction of tooth #'s 2,4,14 with alveoloplasty;  Surgeon: Lenn Cal, DDS;  Location: [place];  Service: Oral Surgery;  Laterality: N/A;  . PATENT FORAMEN OVALE(PFO) CLOSURE N/A 10/02/2016   Procedure: PATENT FORAMEN OVALE (PFO) CLOSURE;  Surgeon: [name], MD;  Location: [place];  Service: Open Heart Surgery;  Laterality: N/A;  . RIGHT/LEFT HEART  CATH AND CORONARY ANGIOGRAPHY N/A 08/03/2016   Procedure: Right/Left Heart Cath and Coronary Angiography;  Surgeon: Burnell Blanks, MD;  Location: [place] [facility];  Service: Cardiovascular;  Laterality: N/A;  . TEE WITHOUT CARDIOVERSION N/A 07/07/2016   Procedure: TRANSESOPHAGEAL ECHOCARDIOGRAM (TEE);  Surgeon: Larey Dresser, MD;  Location: [place];  Service: Cardiovascular;  Laterality: N/A;  . TEE WITHOUT CARDIOVERSION N/A 10/02/2016   Procedure: TRANSESOPHAGEAL ECHOCARDIOGRAM (TEE);  Surgeon: [name], MD;  Location: [place];  Service: Open Heart Surgery;  Laterality:  N/A;  . THORACIC AORTIC ANEURYSM REPAIR N/A 10/02/2016   Procedure: THORACIC ASCENDING ANEURYSM REPAIR  - using 72mm Allograft;  Reimplantation of Left Coronary Button;  Surgeon: [name], MD;  Location: [place];  Service: Open Heart Surgery;  Laterality: N/A;    Current Outpatient Medications  Medication Sig Dispense Refill  . aspirin EC 81 MG tablet Take 81 mg by mouth daily.    Marland Kitchen atorvastatin (LIPITOR) 20 MG tablet TAKE 1 TABLET(20 MG) BY MOUTH DAILY 90 tablet 3  . furosemide (LASIX) 20 MG tablet Take 20 mg by mouth daily.    Marland Kitchen loratadine-pseudoephedrine (CLARITIN-D 24-HOUR) 10-240 MG 24 hr tablet Take 1 tablet by mouth daily.    . metoprolol tartrate (LOPRESSOR) 25 MG tablet TAKE 1 TABLET BY MOUTH TWICE DAILY. HOLD FOR HR LESS THAN 50 180 tablet 2  . Multiple Vitamin (MULTIVITAMIN WITH MINERALS) TABS tablet Take 1 tablet by mouth daily.    . nitroGLYCERIN (NITROSTAT) 0.4 MG SL tablet Place 1 tablet (0.4 mg total) under the tongue every 5 (five) minutes x 3 doses as needed for chest pain (if no relief after 3rd dose, proceed to the ED for an evaluation). 25 tablet 3  . potassium chloride (K-DUR) 10 MEQ tablet Take 1 tablet (10 mEq total) by mouth daily. 90 tablet 3   No current facility-administered medications for this visit.    Allergies:  No known allergies   Social History: The patient  reports that he quit smoking about 26 years ago. His smoking use included cigarettes. He has never used smokeless tobacco. He reports that he does not drink alcohol or use drugs.   ROS:  Please see the history of present illness. Otherwise, complete review of systems is positive for decreased hearing.  All other systems are reviewed and negative.   Physical Exam: VS:  BP 108/64 (BP Location: Left Arm)   Pulse 64   Ht 6' (1.829 m)   Wt 252 lb (114.3 kg)   SpO2 94%   BMI 34.18 kg/m , BMI Body mass index is 34.18 kg/m.  Wt Readings from Last 3 Encounters:  12/16/17 252 lb (114.3 kg)    11/01/17 245 lb (111.1 kg)  10/26/17 247 lb 3.2 oz (112.1 kg)    General: Patient appears comfortable at rest. HEENT: Conjunctiva and lids normal, oropharynx clear. Neck: Supple, no elevated JVP or carotid bruits, no thyromegaly. Lungs: Clear to auscultation, nonlabored breathing at rest. Cardiac: Regular rate and rhythm, no S3, 2/6 systolic murmur. Abdomen: Obese, nontender, bowel sounds present. Extremities: Trace ankle edema, distal pulses 2+. Skin: Warm and dry. Musculoskeletal: No kyphosis. Neuropsychiatric: Alert and oriented x3, affect grossly appropriate.  ECG: I personally reviewed the tracing from 02/01/2017 which showed sinus rhythm with prolonged PR interval, left anterior fascicular block, increased voltage and repolarization abnormalities.  Recent Labwork:  07/01/2016: TSH 1.300 09/24/2016: B Natriuretic Peptide 253.0 10/07/2016: Magnesium 2.7 10/08/2016: ALT 14; AST 13 10/13/2016: Hemoglobin 8.3;  Platelets 369 10/14/2016: BUN 18; Creatinine, Ser 1.42; Potassium 4.1; Sodium 140   Other Studies Reviewed Today:  Echocardiogram 02/08/2017: Study Conclusions  - Left ventricle: Hypokinesis of the basal inferolateral wall. The   cavity size was normal. Wall thickness was increased in a pattern   of moderate LVH. Systolic function was vigorous. The estimated   ejection fraction was in the range of 65% to 70%. - Aortic valve: There was mild regurgitation.  Cardiac catheterization 10/07/2016:  Prox Cx to Mid Cx lesion, 30 %stenosed.  Mid LAD lesion, 20 %stenosed.  Ost 2nd Diag lesion, 20 %stenosed.  Dist LAD lesion, 40 %stenosed.  Ost RCA lesion, 100 %stenosed.  SVG graft was visualized by angiography and is very large and anatomically normal.  1. 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 Patent SVG to PDA. The entire RCA fills from the patent vein graft.  3. Mild disease in the Circumflex and LAD. No change since  last cath.  4. Normal LVEDP  Assessment and Plan:  1.  CAD status post CABG with SVG to the PDA.  Cardiac catheterization from June of last year revealed patent SVG to PDA with mild left system disease.  He does not report any active angina symptoms and continues on aspirin and statin therapy.  2.  History of severe aortic regurgitation and bacterial endocarditis status post aortic root replacement with allograft associated with single-vessel CABG and PFO closure.  Follow-up echocardiogram from last year is reviewed above.  He reports no fevers or chills.  3.  History of cardiomyopathy with normalization of LVEF, in the range of 65 to 70% by echocardiogram in October 2018.  4.  Mixed hyperlipidemia, on Lipitor.  Requesting most recent lab work from Dr. Nevada Crane.  Current medicines were reviewed with the patient today.  Disposition: Follow-up in 6 months.  Signed, Satira Sark, MD, [facility] 12/16/2017 9:25 AM    [facility] at [facility] [address] [place], [place] [address] Phone: [phone]; Fax: [phone]

## 2017-12-16 ENCOUNTER — Ambulatory Visit: Payer: Medicare Other | Admitting: Cardiology

## 2017-12-16 ENCOUNTER — Encounter: Payer: Self-pay | Admitting: Cardiology

## 2017-12-16 VITALS — BP 108/64 | HR 64 | Ht 72.0 in | Wt 252.0 lb

## 2017-12-16 DIAGNOSIS — E782 Mixed hyperlipidemia: Secondary | ICD-10-CM

## 2017-12-16 DIAGNOSIS — I25119 Atherosclerotic heart disease of native coronary artery with unspecified angina pectoris: Secondary | ICD-10-CM | POA: Diagnosis not present

## 2017-12-16 DIAGNOSIS — Z8679 Personal history of other diseases of the circulatory system: Secondary | ICD-10-CM | POA: Diagnosis not present

## 2017-12-16 DIAGNOSIS — Z954 Presence of other heart-valve replacement: Secondary | ICD-10-CM | POA: Diagnosis not present

## 2017-12-16 NOTE — Patient Instructions (Addendum)
Your physician wants you to follow-up in:6 months with Dr.McDowell You will receive a reminder letter in the mail two months in advance. If you don't receive a letter, please call our office to schedule the follow-up appointment.   Your physician recommends that you continue on your current medications as directed. Please refer to the Current Medication list given to you today.   If you need a refill on your cardiac medications before your next appointment, please call your pharmacy.    No lab work or tests ordered today.      Thank you for choosing [facility] !         

## 2017-12-21 ENCOUNTER — Telehealth: Payer: Self-pay

## 2017-12-21 ENCOUNTER — Telehealth: Payer: Self-pay | Admitting: *Deleted

## 2017-12-21 NOTE — Telephone Encounter (Signed)
We do not d/c aspirin. Called patient and made aware.

## 2017-12-21 NOTE — Telephone Encounter (Signed)
Hoyle Sauer called and wanted to see if patient would move up to 11:00am tomorrow for procedure. Called patient and he did not want to move up. Called carolyn and LMOVM making aware.

## 2017-12-21 NOTE — Telephone Encounter (Signed)
We generally don't prescribe abx prior to TCS. However, if he has been given an Rx for this and told to take it by another provider prior to any procedure he can do so.

## 2017-12-21 NOTE — Telephone Encounter (Signed)
Patient also wanted to know when should he take his antibiotic amoxicillin prior to his procedure tomorrow. He states he always has to take this regardless of how invasive procedure is. Please advise EG thanks

## 2017-12-21 NOTE — Telephone Encounter (Signed)
Pt left a VM asking for the nurse to call him back about his procedure TCS on Wednesday 12/22/17. Pt was taking iron and d/c about a week ago when he started reviewing the paperwork for his procedure. Pt also takes a low dose Asprin and wants to know if he should d/c the night before his procedure?

## 2017-12-21 NOTE — Telephone Encounter (Signed)
Pt notified that he can take antibiotic since he was always advised to by his PCP.

## 2017-12-22 ENCOUNTER — Encounter (HOSPITAL_COMMUNITY)
Admission: RE | Disposition: A | Discharge status: Home / Self Care | Payer: Self-pay | Source: Ambulatory Visit | Attending: Internal Medicine

## 2017-12-22 ENCOUNTER — Encounter (HOSPITAL_COMMUNITY): Payer: Self-pay | Admitting: *Deleted

## 2017-12-22 ENCOUNTER — Ambulatory Visit (HOSPITAL_COMMUNITY)
Admission: RE | Admit: 2017-12-22 | Discharge: 2017-12-22 | Disposition: A | Discharge status: Home / Self Care | Payer: Medicare Other | Source: Ambulatory Visit | Attending: Internal Medicine | Admitting: Internal Medicine

## 2017-12-22 ENCOUNTER — Other Ambulatory Visit: Payer: Self-pay

## 2017-12-22 ENCOUNTER — Telehealth: Payer: Self-pay | Admitting: Nurse Practitioner

## 2017-12-22 DIAGNOSIS — Z1211 Encounter for screening for malignant neoplasm of colon: Secondary | ICD-10-CM | POA: Diagnosis not present

## 2017-12-22 DIAGNOSIS — Z952 Presence of prosthetic heart valve: Secondary | ICD-10-CM | POA: Diagnosis not present

## 2017-12-22 DIAGNOSIS — Z7982 Long term (current) use of aspirin: Secondary | ICD-10-CM | POA: Diagnosis not present

## 2017-12-22 DIAGNOSIS — K5730 Diverticulosis of large intestine without perforation or abscess without bleeding: Secondary | ICD-10-CM | POA: Diagnosis not present

## 2017-12-22 DIAGNOSIS — Z951 Presence of aortocoronary bypass graft: Secondary | ICD-10-CM | POA: Insufficient documentation

## 2017-12-22 DIAGNOSIS — Z8774 Personal history of (corrected) congenital malformations of heart and circulatory system: Secondary | ICD-10-CM | POA: Insufficient documentation

## 2017-12-22 DIAGNOSIS — Z79899 Other long term (current) drug therapy: Secondary | ICD-10-CM | POA: Diagnosis not present

## 2017-12-22 DIAGNOSIS — Z87891 Personal history of nicotine dependence: Secondary | ICD-10-CM | POA: Insufficient documentation

## 2017-12-22 DIAGNOSIS — I2510 Atherosclerotic heart disease of native coronary artery without angina pectoris: Secondary | ICD-10-CM | POA: Insufficient documentation

## 2017-12-22 HISTORY — PX: COLONOSCOPY: SHX5424

## 2017-12-22 SURGERY — COLONOSCOPY
Anesthesia: Moderate Sedation

## 2017-12-22 MED ORDER — ONDANSETRON HCL 4 MG/2ML IJ SOLN
INTRAMUSCULAR | Status: DC | PRN
  Administered 2017-12-22: 4 mg via INTRAVENOUS

## 2017-12-22 MED ORDER — MIDAZOLAM HCL 5 MG/5ML IJ SOLN
INTRAMUSCULAR | Status: AC
  Filled 2017-12-22: qty 5

## 2017-12-22 MED ORDER — MEPERIDINE HCL 50 MG/ML IJ SOLN
INTRAMUSCULAR | Status: AC
  Filled 2017-12-22: qty 1

## 2017-12-22 MED ORDER — MIDAZOLAM HCL 5 MG/5ML IJ SOLN
INTRAMUSCULAR | Status: DC | PRN
  Administered 2017-12-22: 2 mg via INTRAVENOUS
  Administered 2017-12-22 (×2): 1 mg via INTRAVENOUS

## 2017-12-22 MED ORDER — MEPERIDINE HCL 100 MG/ML IJ SOLN
INTRAMUSCULAR | Status: DC | PRN
  Administered 2017-12-22: 25 mg via INTRAVENOUS

## 2017-12-22 MED ORDER — SODIUM CHLORIDE 0.9 % IV SOLN
INTRAVENOUS | Status: DC
  Administered 2017-12-22: 14:00:00 via INTRAVENOUS

## 2017-12-22 MED ORDER — ONDANSETRON HCL 4 MG/2ML IJ SOLN
INTRAMUSCULAR | Status: AC
  Filled 2017-12-22: qty 2

## 2017-12-22 MED ORDER — STERILE WATER FOR IRRIGATION IR SOLN
Status: DC | PRN
  Administered 2017-12-22: 1.5 mL

## 2017-12-22 NOTE — Discharge Instructions (Signed)
°Colonoscopy °Discharge Instructions ° °Read the instructions outlined below and refer to this sheet in the next few weeks. These discharge instructions provide you with general information on caring for yourself after you leave the hospital. Your doctor may also give you specific instructions. While your treatment has been planned according to the most current medical practices available, unavoidable complications occasionally occur. If you have any problems or questions after discharge, call Dr. Rourk at [phone]. °ACTIVITY °· You may resume your regular activity, but move at a slower pace for the next 24 hours.  °· Take frequent rest periods for the next 24 hours.  °· Walking will help get rid of the air and reduce the bloated feeling in your belly (abdomen).  °· No driving for 24 hours (because of the medicine (anesthesia) used during the test).   °· Do not sign any important legal documents or operate any machinery for 24 hours (because of the anesthesia used during the test).  °NUTRITION °· Drink plenty of fluids.  °· You may resume your normal diet as instructed by your doctor.  °· Begin with a light meal and progress to your normal diet. Heavy or fried foods are harder to digest and may make you feel sick to your stomach (nauseated).  °· Avoid alcoholic beverages for 24 hours or as instructed.  °MEDICATIONS °· You may resume your normal medications unless your doctor tells you otherwise.  °WHAT YOU CAN EXPECT TODAY °· Some feelings of bloating in the abdomen.  °· Passage of more gas than usual.  °· Spotting of blood in your stool or on the toilet paper.  °IF YOU HAD POLYPS REMOVED DURING THE COLONOSCOPY: °· No aspirin products for 7 days or as instructed.  °· No alcohol for 7 days or as instructed.  °· Eat a soft diet for the next 24 hours.  °FINDING OUT THE RESULTS OF YOUR TEST °Not all test results are available during your visit. If your test results are not back during the visit, make an appointment  with your caregiver to find out the results. Do not assume everything is normal if you have not heard from your caregiver or the medical facility. It is important for you to follow up on all of your test results.  °SEEK IMMEDIATE MEDICAL ATTENTION IF: °· You have more than a spotting of blood in your stool.  °· Your belly is swollen (abdominal distention).  °· You are nauseated or vomiting.  °· You have a temperature over 101.  °· You have abdominal pain or discomfort that is severe or gets worse throughout the day.  ° ° °Diverticulosis information provided ° °I do not recommend a future colonoscopy unless new symptoms develop. ° ° °Diverticulosis °Diverticulosis is a condition that develops when small pouches (diverticula) form in the wall of the large intestine (colon). The colon is where water is absorbed and stool is formed. The pouches form when the inside layer of the colon pushes through weak spots in the outer layers of the colon. You may have a few pouches or many of them. °What are the causes? °The cause of this condition is not known. °What increases the risk? °The following factors may make you more likely to develop this condition: °· Being older than age 60. Your risk for this condition increases with age. Diverticulosis is rare among people younger than age 30. By age 80, many people have it. °· Eating a low-fiber diet. °· Having frequent constipation. °· Being overweight. °·   Not getting enough exercise. °· Smoking. °· Taking over-the-counter pain medicines, like aspirin and ibuprofen. °· Having a family history of diverticulosis. ° °What are the signs or symptoms? °In most people, there are no symptoms of this condition. If you do have symptoms, they may include: °· Bloating. °· Cramps in the abdomen. °· Constipation or diarrhea. °· Pain in the lower left side of the abdomen. ° °How is this diagnosed? °This condition is most often diagnosed during an exam for other colon problems. Because  diverticulosis usually has no symptoms, it often cannot be diagnosed independently. This condition may be diagnosed by: °· Using a flexible scope to examine the colon (colonoscopy). °· Taking an X-ray of the colon after dye has been put into the colon (barium enema). °· Doing a CT scan. ° °How is this treated? °You may not need treatment for this condition if you have never developed an infection related to diverticulosis. If you have had an infection before, treatment may include: °· Eating a high-fiber diet. This may include eating more fruits, vegetables, and grains. °· Taking a fiber supplement. °· Taking a live bacteria supplement (probiotic). °· Taking medicine to relax your colon. °· Taking antibiotic medicines. ° °Follow these instructions at home: °· Drink 6-8 glasses of water or more each day to prevent constipation. °· Try not to strain when you have a bowel movement. °· If you have had an infection before: °? Eat more fiber as directed by your health care provider or your diet and nutrition specialist (dietitian). °? Take a fiber supplement or probiotic, if your health care provider approves. °· Take over-the-counter and prescription medicines only as told by your health care provider. °· If you were prescribed an antibiotic, take it as told by your health care provider. Do not stop taking the antibiotic even if you start to feel better. °· Keep all follow-up visits as told by your health care provider. This is important. °Contact a health care provider if: °· You have pain in your abdomen. °· You have bloating. °· You have cramps. °· You have not had a bowel movement in 3 days. °Get help right away if: °· Your pain gets worse. °· Your bloating becomes very bad. °· You have a fever or chills, and your symptoms suddenly get worse. °· You vomit. °· You have bowel movements that are bloody or black. °· You have bleeding from your rectum. °Summary °· Diverticulosis is a condition that develops when small  pouches (diverticula) form in the wall of the large intestine (colon). °· You may have a few pouches or many of them. °· This condition is most often diagnosed during an exam for other colon problems. °· If you have had an infection related to diverticulosis, treatment may include increasing the fiber in your diet, taking supplements, or taking medicines. °This information is not intended to replace advice given to you by your health care provider. Make sure you discuss any questions you have with your health care provider. °Document Released: 12/26/2003 Document Revised: 02/17/2016 Document Reviewed: 02/17/2016 °Elsevier Interactive Patient Education © 2017 [employer] ° °

## 2017-12-22 NOTE — Telephone Encounter (Signed)
Spoke with Charles [name] in Endo. [name] forgot to take his antibiotic.  I reiterated that we generally do not prescribe antibiotic prior to colonoscopy for heart stent.  He was told he could take his antibiotic because he stated that another provider has told him to take it prior to procedures.  I relayed this to Charles [name] who will notify Dr. Gala Romney and Charles [name].  Charles [name] does not have to have an antibiotic prior to his colonoscopy.

## 2017-12-22 NOTE — H&P (Signed)
@LOGO @   Primary Care Physician:  [name], MD Primary Gastroenterologist:  Dr. Gala Romney  Pre-Procedure History & Physical: HPI:  Charles [name] is a 72 y.o. male is here for a screening colonoscopy. No prior colonoscopy. No bowel symptom  Past Medical History:  Diagnosis Date  . Anemia   . Aortic insufficiency    a. 07/07/2016: TEE showing bicuspid aortic valve with severe eccentric AI   . Ascending aortic aneurysm (HCC)    4.4 cm by CT 06/2016  . 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 Bicuspid aortic valve   . Chronic periodontitis   . Coronary artery disease   . Pneumonia 1978  . 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 S/P patent foramen ovale closure 10/02/2016    Past Surgical History:  Procedure Laterality Date  . AORTIC VALVE REPLACEMENT N/A 10/02/2016   Procedure: AORTIC ROOT REPLACEMENT  - using 28mm Allograft;  Surgeon: [name], MD;  Location: [place];  Service: Open Heart Surgery;  Laterality: N/A;  . CORONARY ARTERY BYPASS GRAFT N/A 10/02/2016   Procedure: CORONARY ARTERY BYPASS GRAFTING (CABG) x one, using right leg greater saphenous vein harvested endoscopically;  Surgeon: [name], MD;  Location: [place];  Service: Open Heart Surgery;  Laterality: N/A;  . FALSE ANEURYSM REPAIR  10/02/2016   Procedure: REPAIR FALSE ANEURYSM of Aortic Root Resection and Grafting of Aortic Root;  Surgeon: [name], MD;  Location: [place];  Service: Open Heart Surgery;;  . HERNIA REPAIR Left   . LEFT HEART CATH AND CORS/GRAFTS ANGIOGRAPHY N/A 10/07/2016   Procedure: Left Heart Cath and Cors/Grafts Angiography;  Surgeon: Burnell Blanks, MD;  Location:  [place] [facility];  Service: Cardiovascular;  Laterality: N/A;  . MULTIPLE EXTRACTIONS WITH ALVEOLOPLASTY N/A 08/13/2016   Procedure: Extraction of tooth #'s 2,4,14 with alveoloplasty;  Surgeon: Lenn Cal, DDS;  Location: [place];  Service: Oral Surgery;  Laterality: N/A;  . PATENT FORAMEN OVALE(PFO) CLOSURE N/A 10/02/2016   Procedure: PATENT FORAMEN OVALE (PFO) CLOSURE;  Surgeon: [name], MD;  Location: [place];  Service: Open Heart Surgery;  Laterality: N/A;  . RIGHT/LEFT HEART CATH AND CORONARY ANGIOGRAPHY N/A 08/03/2016   Procedure: Right/Left Heart Cath and Coronary Angiography;  Surgeon: Burnell Blanks, MD;  Location: [place] [facility];  Service: Cardiovascular;  Laterality: N/A;  . TEE WITHOUT CARDIOVERSION N/A 07/07/2016   Procedure: TRANSESOPHAGEAL ECHOCARDIOGRAM (TEE);  Surgeon: Larey Dresser, MD;  Location: [place];  Service: Cardiovascular;  Laterality: N/A;  . TEE WITHOUT CARDIOVERSION N/A 10/02/2016   Procedure: TRANSESOPHAGEAL ECHOCARDIOGRAM (TEE);  Surgeon: [name], MD;  Location: [place];  Service: Open Heart Surgery;  Laterality: N/A;  . THORACIC AORTIC ANEURYSM REPAIR N/A 10/02/2016   Procedure: THORACIC ASCENDING ANEURYSM REPAIR  - using 12mm Allograft;  Reimplantation of Left Coronary Button;  Surgeon: [name], MD;  Location: [place];  Service: Open Heart Surgery;  Laterality: N/A;    Prior to Admission medications   Medication Sig Start Date End Date Taking? Authorizing Provider  amoxicillin (AMOXIL) 500 MG tablet Take 2,000 mg by mouth See admin instructions.  Take 2000 mg by mouth 1 hour prior to dental or invasive procedure   Yes [provider]  aspirin EC 81 MG tablet Take 81 mg by mouth daily.   Yes [provider]  atorvastatin (LIPITOR) 20 MG tablet TAKE 1 TABLET(20 MG) BY MOUTH DAILY Patient taking differently: Take 20 mg by mouth every evening.  07/15/17  Yes Satira Sark, MD  ferrous sulfate 325 (65 FE) MG  tablet Take 325 mg by mouth daily with breakfast.   Yes [provider]  furosemide (LASIX) 20 MG tablet Take 20 mg by mouth daily.   Yes [provider]  loratadine-pseudoephedrine (CLARITIN-D 24-HOUR) 10-240 MG 24 hr tablet Take 1 tablet by mouth daily as needed for allergies.    Yes [provider]  metoprolol tartrate (LOPRESSOR) 25 MG tablet TAKE 1 TABLET BY MOUTH TWICE DAILY. HOLD FOR HR LESS THAN 50 Patient taking differently: Take 25 mg by mouth 2 (two) times daily.  07/15/17  Yes Satira Sark, MD  Multiple Vitamin (MULTIVITAMIN WITH MINERALS) TABS tablet Take 1 tablet by mouth daily.   Yes [provider]  nitroGLYCERIN (NITROSTAT) 0.4 MG SL tablet Place 1 tablet (0.4 mg total) under the tongue every 5 (five) minutes x 3 doses as needed for chest pain (if no relief after 3rd dose, proceed to the ED for an evaluation). 10/13/17  Yes Satira Sark, MD  potassium chloride (K-DUR) 10 MEQ tablet Take 1 tablet (10 mEq total) by mouth daily. 05/06/17 12/17/17 Yes Satira Sark, MD    Allergies as of 11/25/2017 - Review Complete 11/01/2017  Allergen Reaction Noted  . No known allergies  07/28/2016    Family History  Problem Relation Age of Onset  . Ovarian cancer Mother   . Heart disease Father 8       Died of coronary thrombosis.    . Colon cancer Neg Hx     Social History   Socioeconomic History  . Marital status: Widowed    Spouse name: Not on file  . Number of children: 3  . Years of education: Not on file  . Highest education level: Not on file  Occupational History  . Not on file  Social Needs  . Financial resource strain: Not on file  . Food insecurity:    Worry: Not on file    Inability: Not on file  . Transportation needs:    Medical: Not on file    Non-medical: Not on file  Tobacco Use  . Smoking status: Former Smoker    Types: Cigarettes    Last attempt to quit: 05/01/1991    Years since quitting: 26.6  . Smokeless  tobacco: Never Used  . Tobacco comment: Quit 30 years ago.    Substance and Sexual Activity  . Alcohol use: No  . Drug use: No  . Sexual activity: Not on file  Lifestyle  . Physical activity:    Days per week: Not on file    Minutes per session: Not on file  . Stress: Not on file  Relationships  . Social connections:    Talks on phone: Not on file    Gets together: Not on file    Attends religious service: Not on file    Active member of club or organization: Not on file    Attends meetings of clubs or organizations: Not on file    Relationship status: Not on file  . Intimate partner violence:    Fear  of current or ex partner: Not on file    Emotionally abused: Not on file    Physically abused: Not on file    Forced sexual activity: Not on file  Other Topics Concern  . Not on file  Social History Narrative  . Not on file    Review of Systems: See HPI, otherwise negative ROS  Physical Exam: BP 126/71   Pulse (!) 58   Temp 98.8 F (37.1 C) (Oral)   Resp 13   SpO2 94%  General:   Alert,  Well-developed, well-nourished, pleasant and cooperative in NAD Lungs:  Clear throughout to auscultation.   No wheezes, crackles, or rhonchi. No acute distress. Abdomen:  Soft, nontender and nondistended. No masses, hepatosplenomegaly or hernias noted. Normal bowel sounds, without guarding, and without rebound.    Impression/Plan: Charles [name] is now here to undergo a screening colonoscopy.  First ever average risk screening examination.The risks, benefits, limitations, alternatives and imponderables have been reviewed with the patient. Questions have been answered. All parties are agreeable.  As discussed with the patient, SBE prophylaxis is not indicated for colonoscopy.  Risks, benefits, limitations, imponderables and alternatives regarding colonoscopy have been reviewed with the patient. Questions have been answered. All parties agreeable.     Notice:  This dictation was  prepared with Dragon dictation along with smaller phrase technology. Any transcriptional errors that result from this process are unintentional and may not be corrected upon review.

## 2017-12-22 NOTE — Op Note (Addendum)
[facility] Patient Name: Charles [name] Procedure Date: 12/22/2017 1:45 PM MRN: [id number] Date of Birth: [birth year] Attending MD: Norvel Richards , MD CSN: [id number] Age: 72 Admit Type: Outpatient Procedure:                Colonoscopy Indications:              Screening for colorectal malignant neoplasm Providers:                Norvel Richards, MD, Charlsie Quest. Theda Sers RN, RN,                            Nelma Rothman, Technician Referring MD:              Medicines:                Midazolam 4 mg IV, Meperidine 25 mg IV Complications:            No immediate complications. Estimated Blood Loss:     Estimated blood loss: none. Procedure:                Pre-Anesthesia Assessment:                           - Prior to the procedure, a History and Physical                            was performed, and patient medications and                            allergies were reviewed. The patient's tolerance of                            previous anesthesia was also reviewed. The risks                            and benefits of the procedure and the sedation                            options and risks were discussed with the patient.                            All questions were answered, and informed consent                            was obtained. Prior Anticoagulants: The patient has                            taken no previous anticoagulant or antiplatelet                            agents. ASA Grade Assessment: III - A patient with                            severe systemic disease. After reviewing the risks  and benefits, the patient was deemed in                            satisfactory condition to undergo the procedure.                           After obtaining informed consent, the colonoscope                            was passed under direct vision. Throughout the                            procedure, the patient's blood pressure, pulse, and                             oxygen saturations were monitored continuously. The                            CF-HQ190L ([id number]) scope was introduced through                            the anus and advanced to the the cecum, identified                            by appendiceal orifice and ileocecal valve. The                            colonoscopy was performed without difficulty. The                            patient tolerated the procedure well. The quality                            of the bowel preparation was adequate. The                            ileocecal valve, appendiceal orifice, and rectum                            were photographed. The ileocecal valve, appendiceal                            orifice, and rectum were photographed. The entire                            colon was well visualized. Scope In: 1:59:03 PM Scope Out: 2:10:43 PM Scope Withdrawal Time: 0 hours 7 minutes 11 seconds  Total Procedure Duration: 0 hours 11 minutes 40 seconds  Findings:      The perianal and digital rectal examinations were normal.      Scattered small and large-mouthed diverticula were found in the sigmoid       colon and descending colon.      The exam was otherwise without abnormality on direct and retroflexion       views. Impression:               -  Diverticulosis in the sigmoid colon and in the                            descending colon.                           - The examination was otherwise normal on direct                            and retroflexion views.                           - No specimens collected. Moderate Sedation:      Moderate (conscious) sedation was administered by the endoscopy nurse       and supervised by the endoscopist. The following parameters were       monitored: oxygen saturation, heart rate, blood pressure, respiratory       rate, EKG, adequacy of pulmonary ventilation, and response to care.       Total physician intraservice time was 18  minutes. Recommendation:           - Patient has a contact number available for                            emergencies. The signs and symptoms of potential                            delayed complications were discussed with the                            patient. Return to normal activities tomorrow.                            Written discharge instructions were provided to the                            patient.                           - Resume previous diet.                           - Return to GI office PRN.                           - No repeat colonoscopy due to age. Procedure Code(s):        --- Professional ---                           [phone], Colonoscopy, flexible; diagnostic, including                            collection of specimen(s) by brushing or washing,                            when performed (separate procedure)  G0500, Moderate sedation services provided by the                            same physician or other qualified health care                            professional performing a gastrointestinal                            endoscopic service that sedation supports,                            requiring the presence of an independent trained                            observer to assist in the monitoring of the                            patient's level of consciousness and physiological                            status; initial 15 minutes of intra-service time;                            patient age 69 years or older (additional time may                            be reported with [phone], as appropriate) Diagnosis Code(s):        --- Professional ---                           Z12.11, Encounter for screening for malignant                            neoplasm of colon                           K57.30, Diverticulosis of large intestine without                            perforation or abscess without bleeding CPT copyright 2018 American  Medical Association. All rights reserved. The codes documented in this report are preliminary and upon coder review may  be revised to meet current compliance requirements. Cristopher Estimable. Isatu Macinnes, MD Norvel Richards, MD 12/22/2017 2:18:43 PM This report has been signed electronically. Number of Addenda: 0

## 2017-12-28 ENCOUNTER — Encounter (HOSPITAL_COMMUNITY): Payer: Self-pay | Admitting: Internal Medicine

## 2018-01-26 DIAGNOSIS — M25579 Pain in unspecified ankle and joints of unspecified foot: Secondary | ICD-10-CM | POA: Diagnosis not present

## 2018-01-26 DIAGNOSIS — M79606 Pain in leg, unspecified: Secondary | ICD-10-CM | POA: Diagnosis not present

## 2018-01-26 DIAGNOSIS — L0390 Cellulitis, unspecified: Secondary | ICD-10-CM | POA: Diagnosis not present

## 2018-01-26 DIAGNOSIS — R001 Bradycardia, unspecified: Secondary | ICD-10-CM | POA: Diagnosis not present

## 2018-01-28 DIAGNOSIS — R944 Abnormal results of kidney function studies: Secondary | ICD-10-CM | POA: Diagnosis not present

## 2018-01-28 DIAGNOSIS — E782 Mixed hyperlipidemia: Secondary | ICD-10-CM | POA: Diagnosis not present

## 2018-01-28 DIAGNOSIS — R001 Bradycardia, unspecified: Secondary | ICD-10-CM | POA: Diagnosis not present

## 2018-01-28 DIAGNOSIS — D509 Iron deficiency anemia, unspecified: Secondary | ICD-10-CM | POA: Diagnosis not present

## 2018-03-09 IMAGING — CR DG CHEST 1V PORT
1 series · 1 of 1 positions shown · non-contrast
Comparison: 10/03/2016

CLINICAL DATA: Status post aortic valve replacement.

EXAM:
PORTABLE CHEST 1 VIEW

[AP]
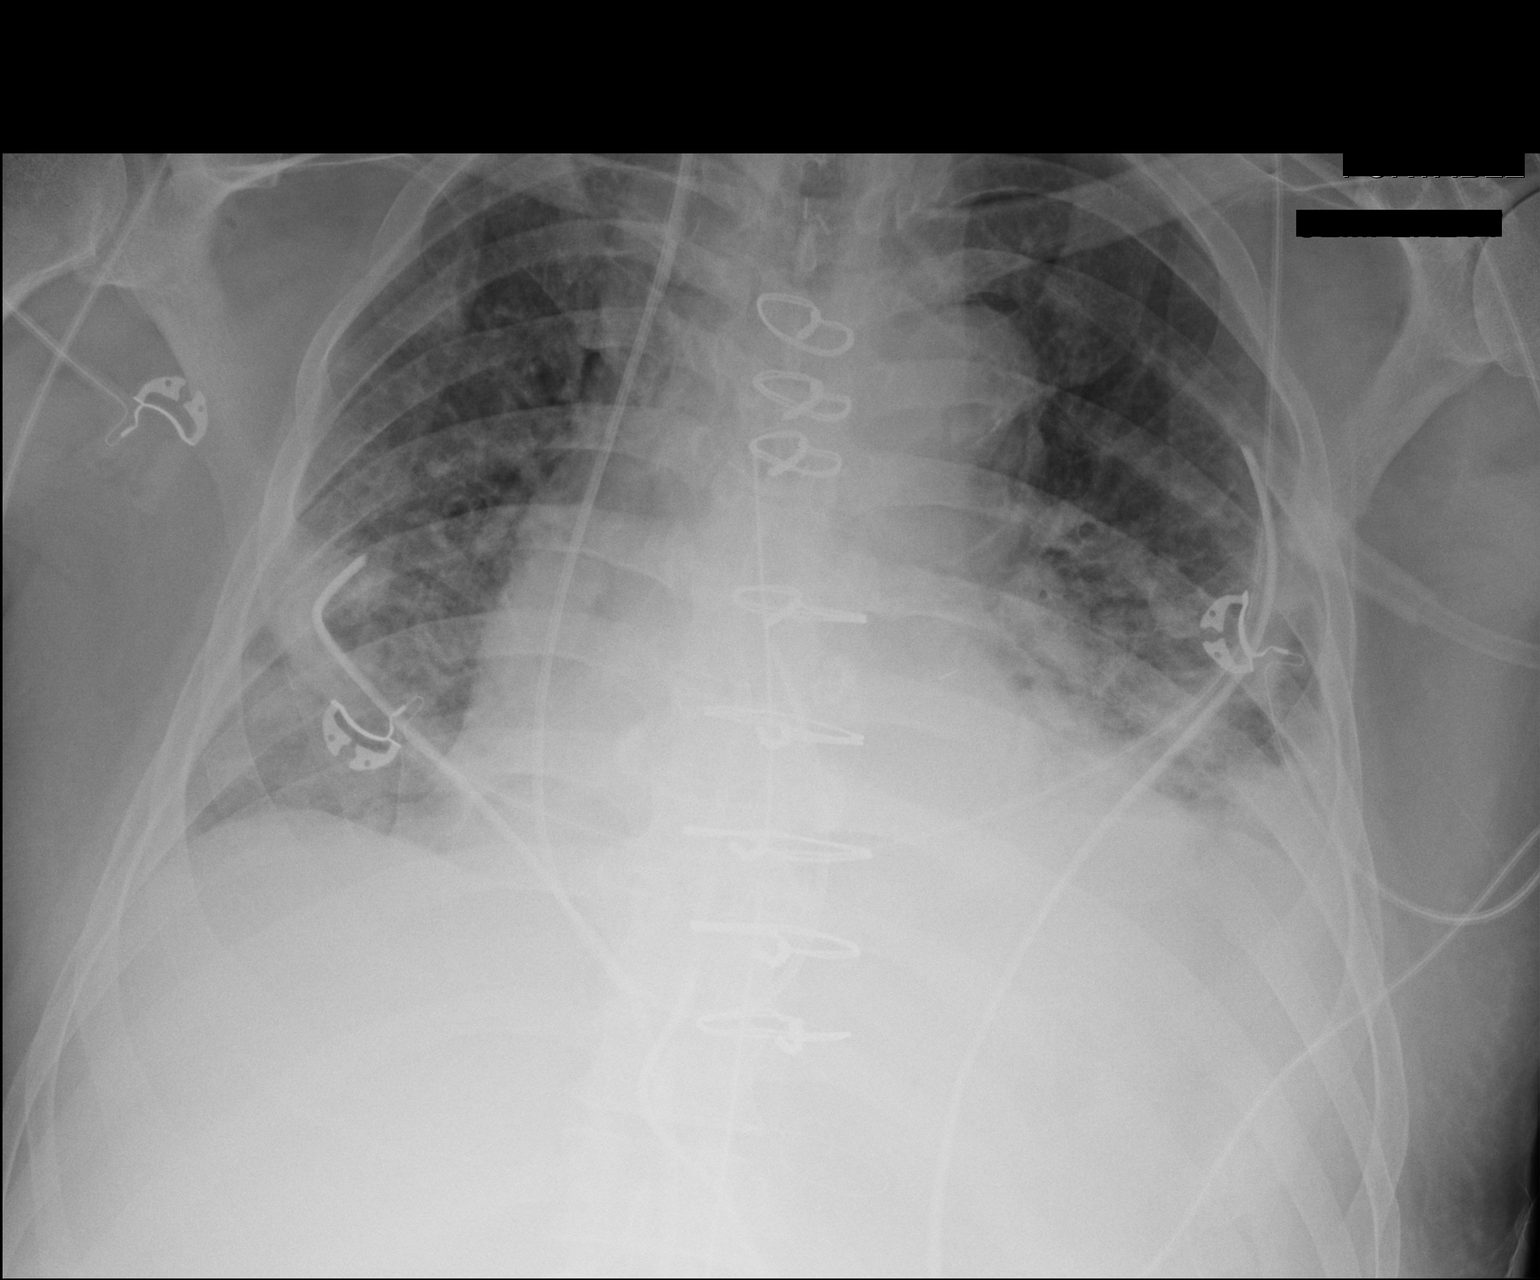

[1 of 1 positions shown; findings below may reference images not displayed]

FINDINGS: Endotracheal and enteric tubes have been removed. Mediastinal drain
and bilateral chest tubes remain. A right jugular Swan-Ganz catheter
remains and projects over the main pulmonary outflow tract. The
cardiomediastinal silhouette is unchanged. Lung volumes are
diminished compared to the prior study. There is pulmonary vascular
congestion with persistent left greater than right perihilar
opacities. More prominent left basilar opacity has increased, as has
mild right basilar opacity. There is likely a small left pleural
effusion. No pneumothorax is identified.
IMPRESSION: 1. Interval extubation.
2. Low lung volumes with worsening bibasilar atelectasis. Persistent
mild pulmonary edema.
3. No pneumothorax.

## 2018-03-10 IMAGING — CR DG CHEST 1V PORT
1 series · 1 of 1 positions shown · non-contrast
Comparison: 10/04/2016

CLINICAL DATA: Follow-up chest tube

EXAM:
PORTABLE CHEST 1 VIEW

[AP]
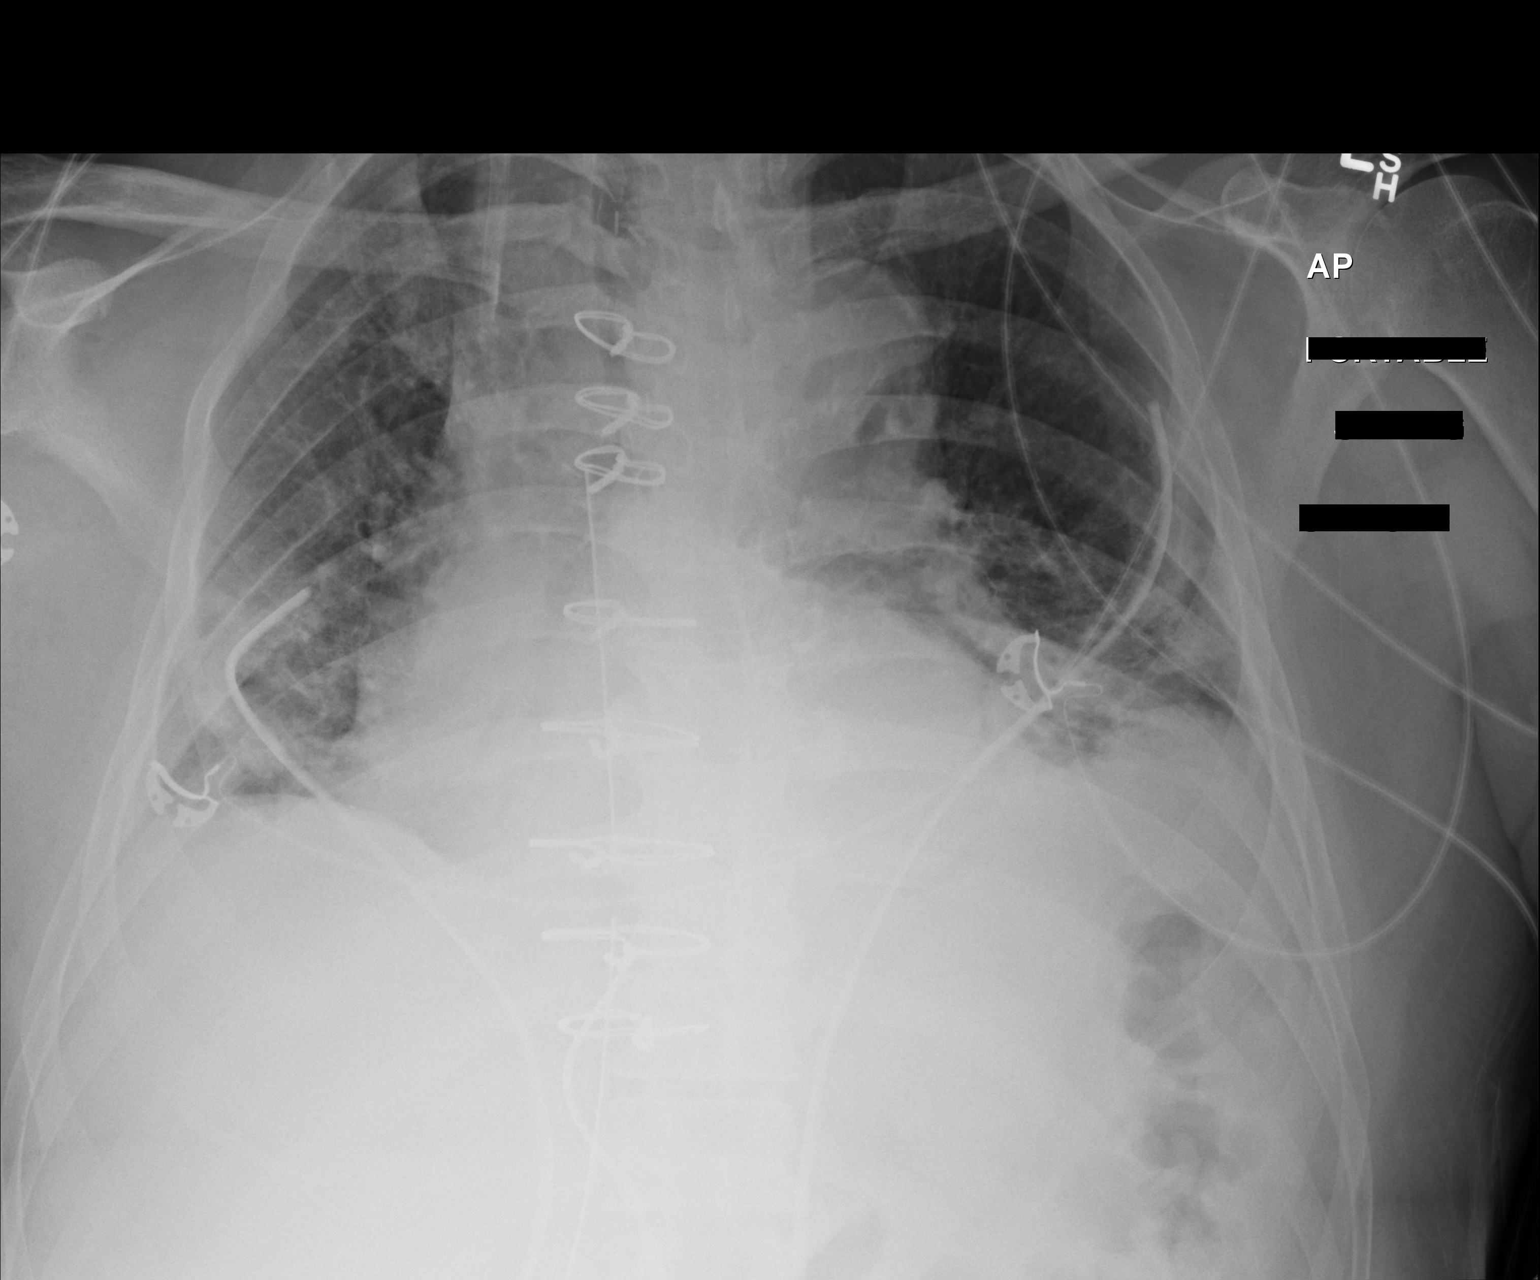

[1 of 1 positions shown; findings below may reference images not displayed]

FINDINGS: Cardiac shadow is mildly enlarged. Postsurgical changes are again
seen. Mediastinal drain and bilateral thoracostomy catheters are
noted. No pneumothorax is seen. Swan-Ganz catheter has been removed
although the right jugular sheath remains. Bibasilar atelectatic
changes are again noted and stable. The previously seen vascular
congestion and pulmonary edema has resolved in the interval. No
acute bony abnormality is noted.
IMPRESSION: Postsurgical changes with tubes and lines as described.

Bibasilar atelectatic changes stable from the prior exam.

## 2018-03-12 IMAGING — DX DG CHEST 1V PORT
1 series · 1 of 1 positions shown · non-contrast
Comparison: 10/07/2006.

CLINICAL DATA: Atelectasis.

EXAM:
PORTABLE CHEST 1 VIEW

[chest ap]
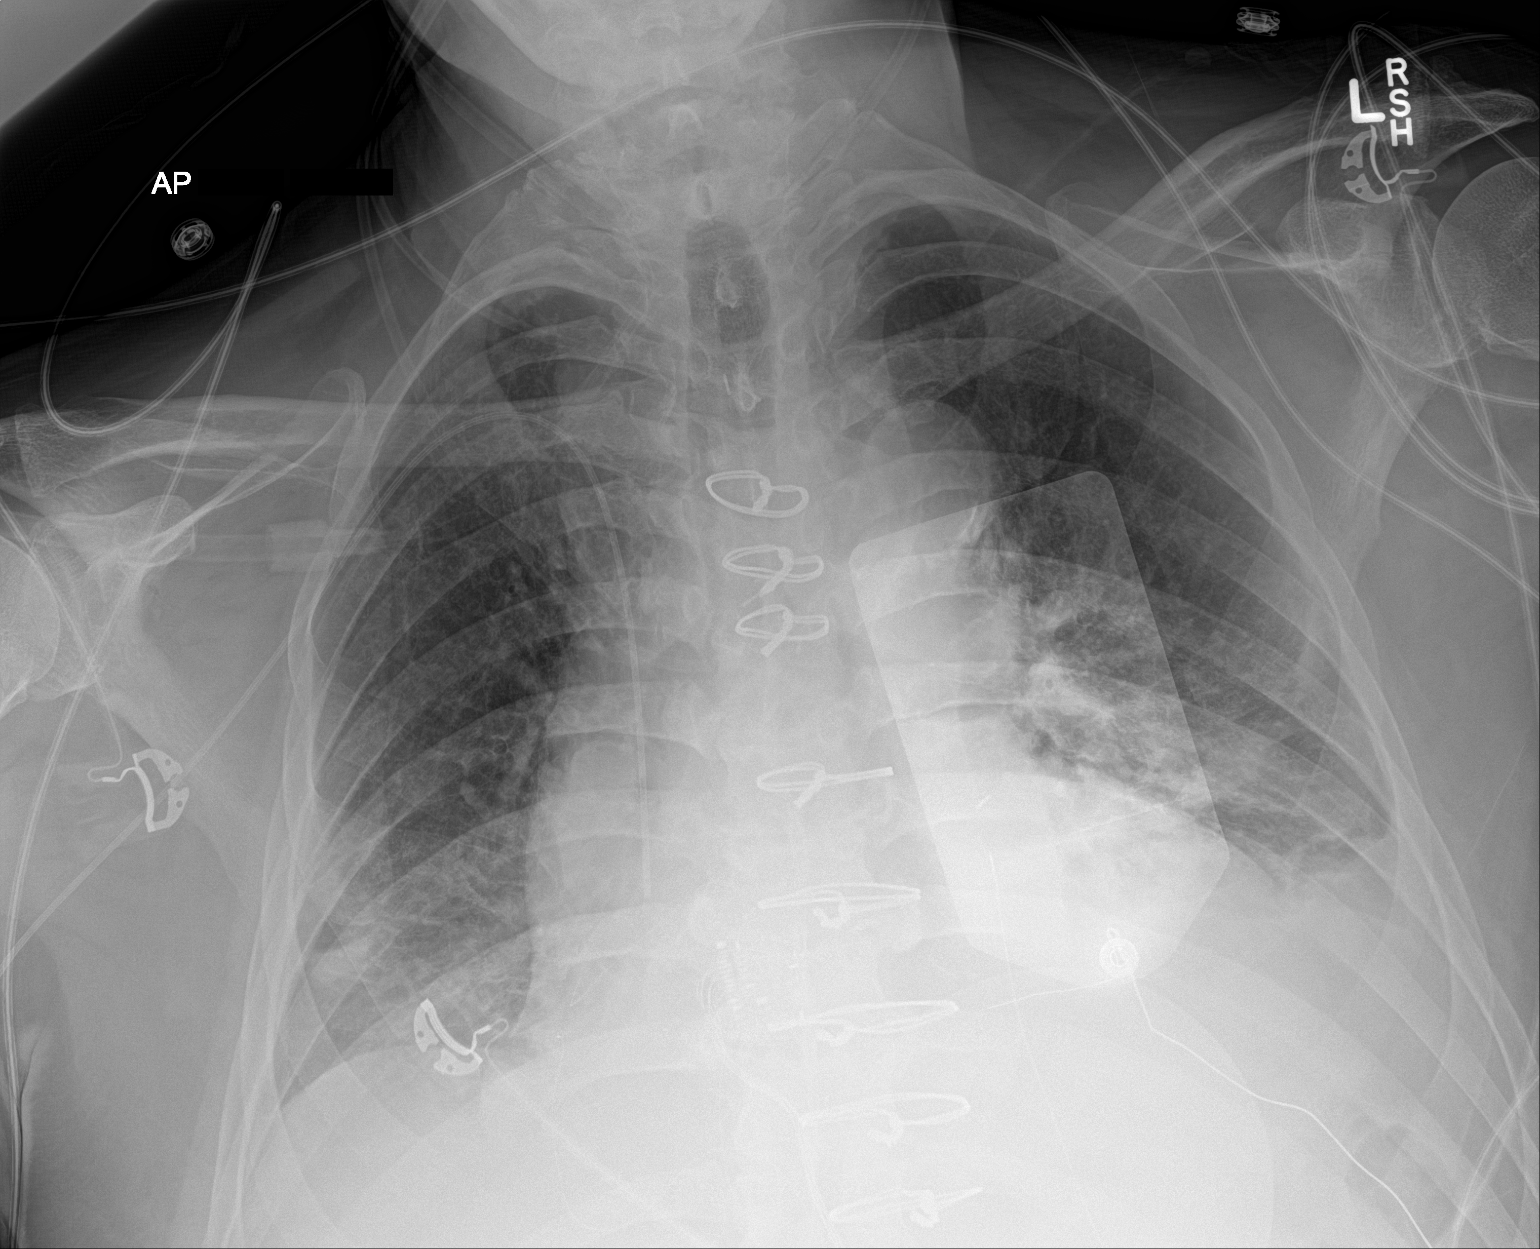

[1 of 1 positions shown; findings below may reference images not displayed]

FINDINGS: Removal of bilateral chest tubes, mediastinal drainage catheter,
right IJ sheath. Right PICC line scratched in stable position .
Prior CABG. Cardiomegaly. Mild bilateral pulmonary infiltrates
consistent pulmonary edema noted. Low lung volumes with basilar
atelectasis. Small left pleural effusion. Low lung volumes with
basilar atelectasis. No pneumothorax.
IMPRESSION: 1. Interim removal of bilateral chest tubes, mediastinal drainage
catheter, right IJ sheath Right PICC line stable position.

2. Prior CABG. Persistent cardiomegaly. Mild bilateral pulmonary
mild infiltrates are noted consistent with pulmonary edema. Small
left-sided pleural effusion .

3. Low lung volumes with basilar atelectasis.

## 2018-03-15 ENCOUNTER — Other Ambulatory Visit: Payer: Self-pay | Admitting: Thoracic Surgery (Cardiothoracic Vascular Surgery)

## 2018-03-15 ENCOUNTER — Other Ambulatory Visit: Payer: Self-pay | Admitting: Cardiology

## 2018-03-15 DIAGNOSIS — I359 Nonrheumatic aortic valve disorder, unspecified: Secondary | ICD-10-CM

## 2018-03-18 IMAGING — CR DG CHEST 2V
2 series · 2 of 2 positions shown · non-contrast
Comparison: Chest x-ray 10/10/2016.

CLINICAL DATA: 71-year-old male status post aortic valve
replacement.

EXAM:
CHEST  2 VIEW

[chest pa]
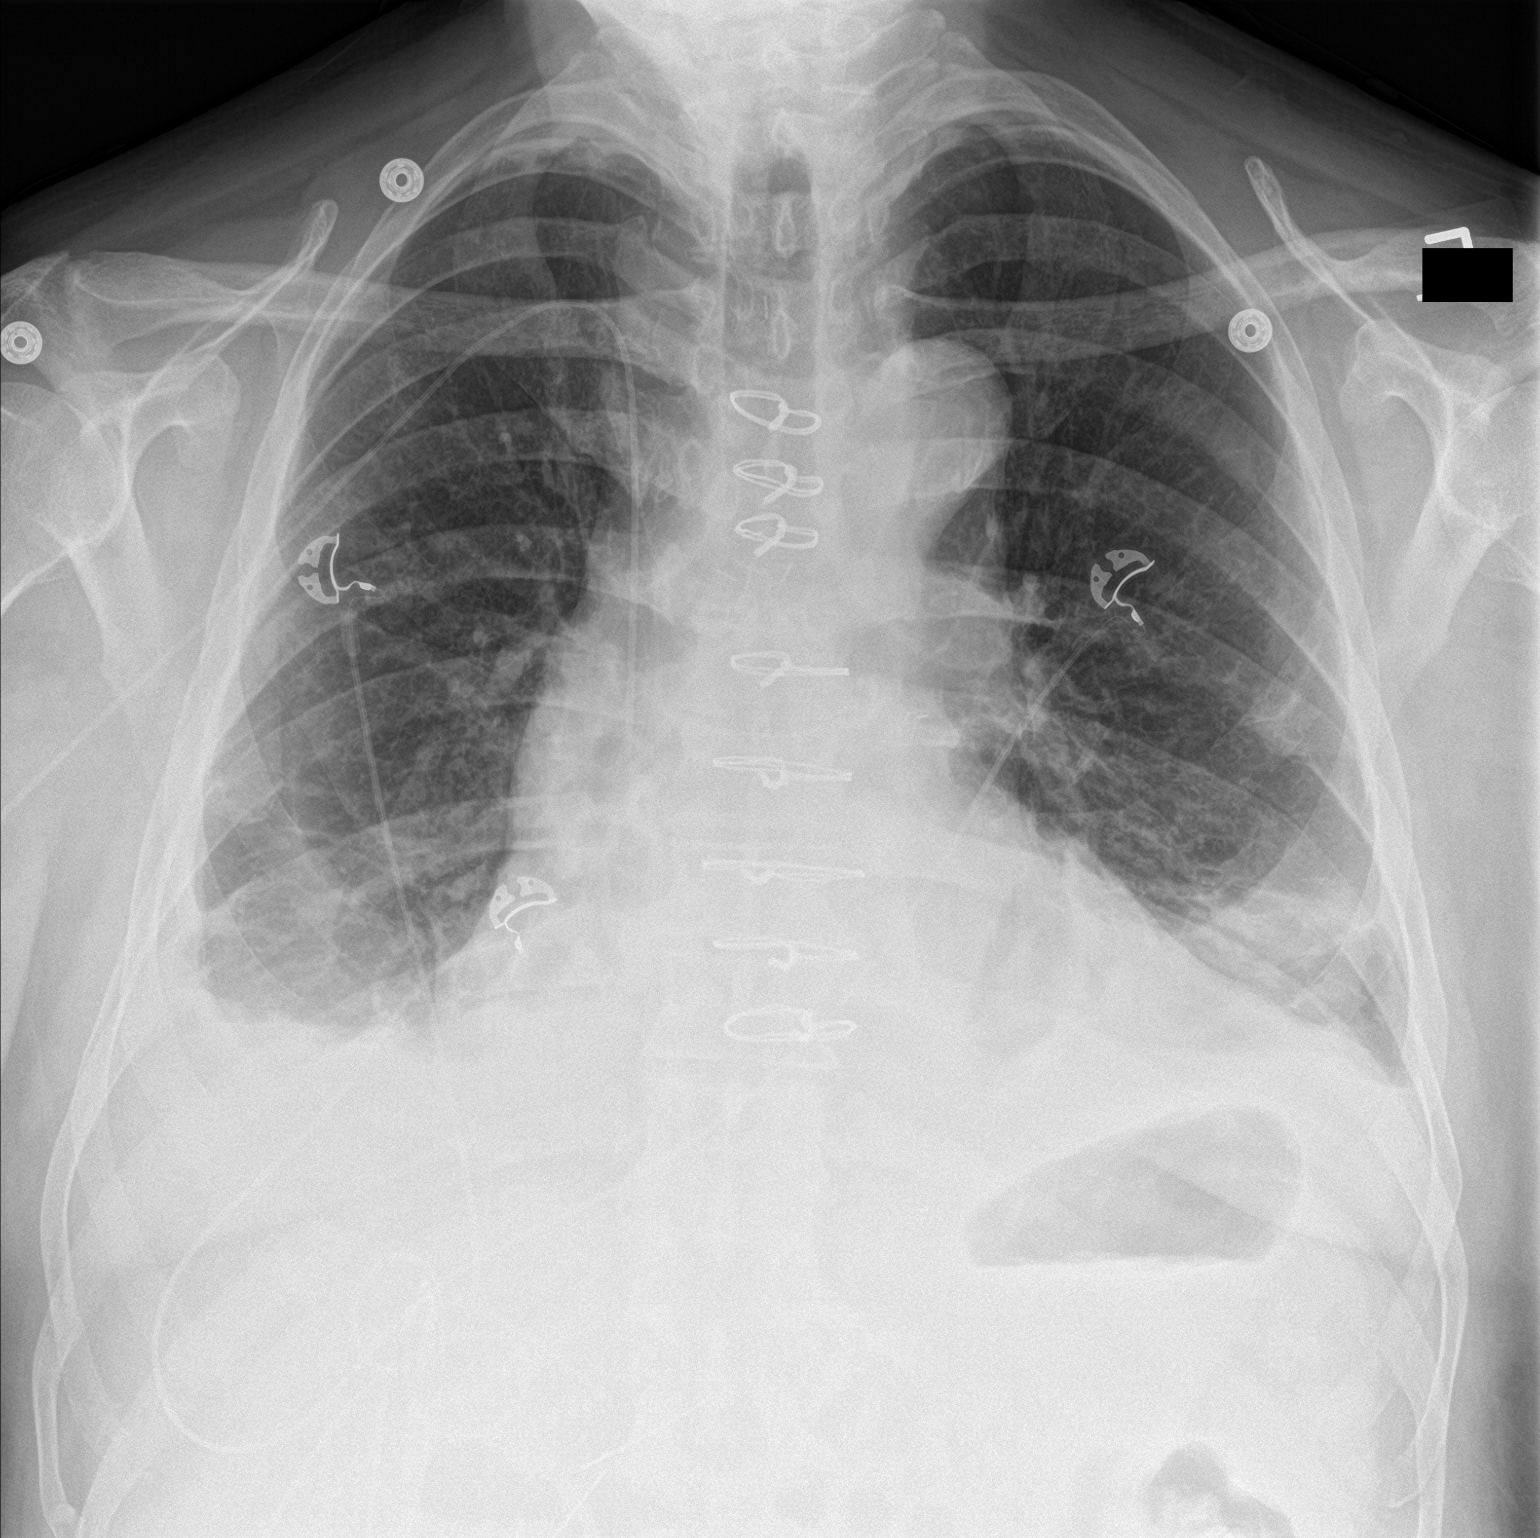

[chest lat]
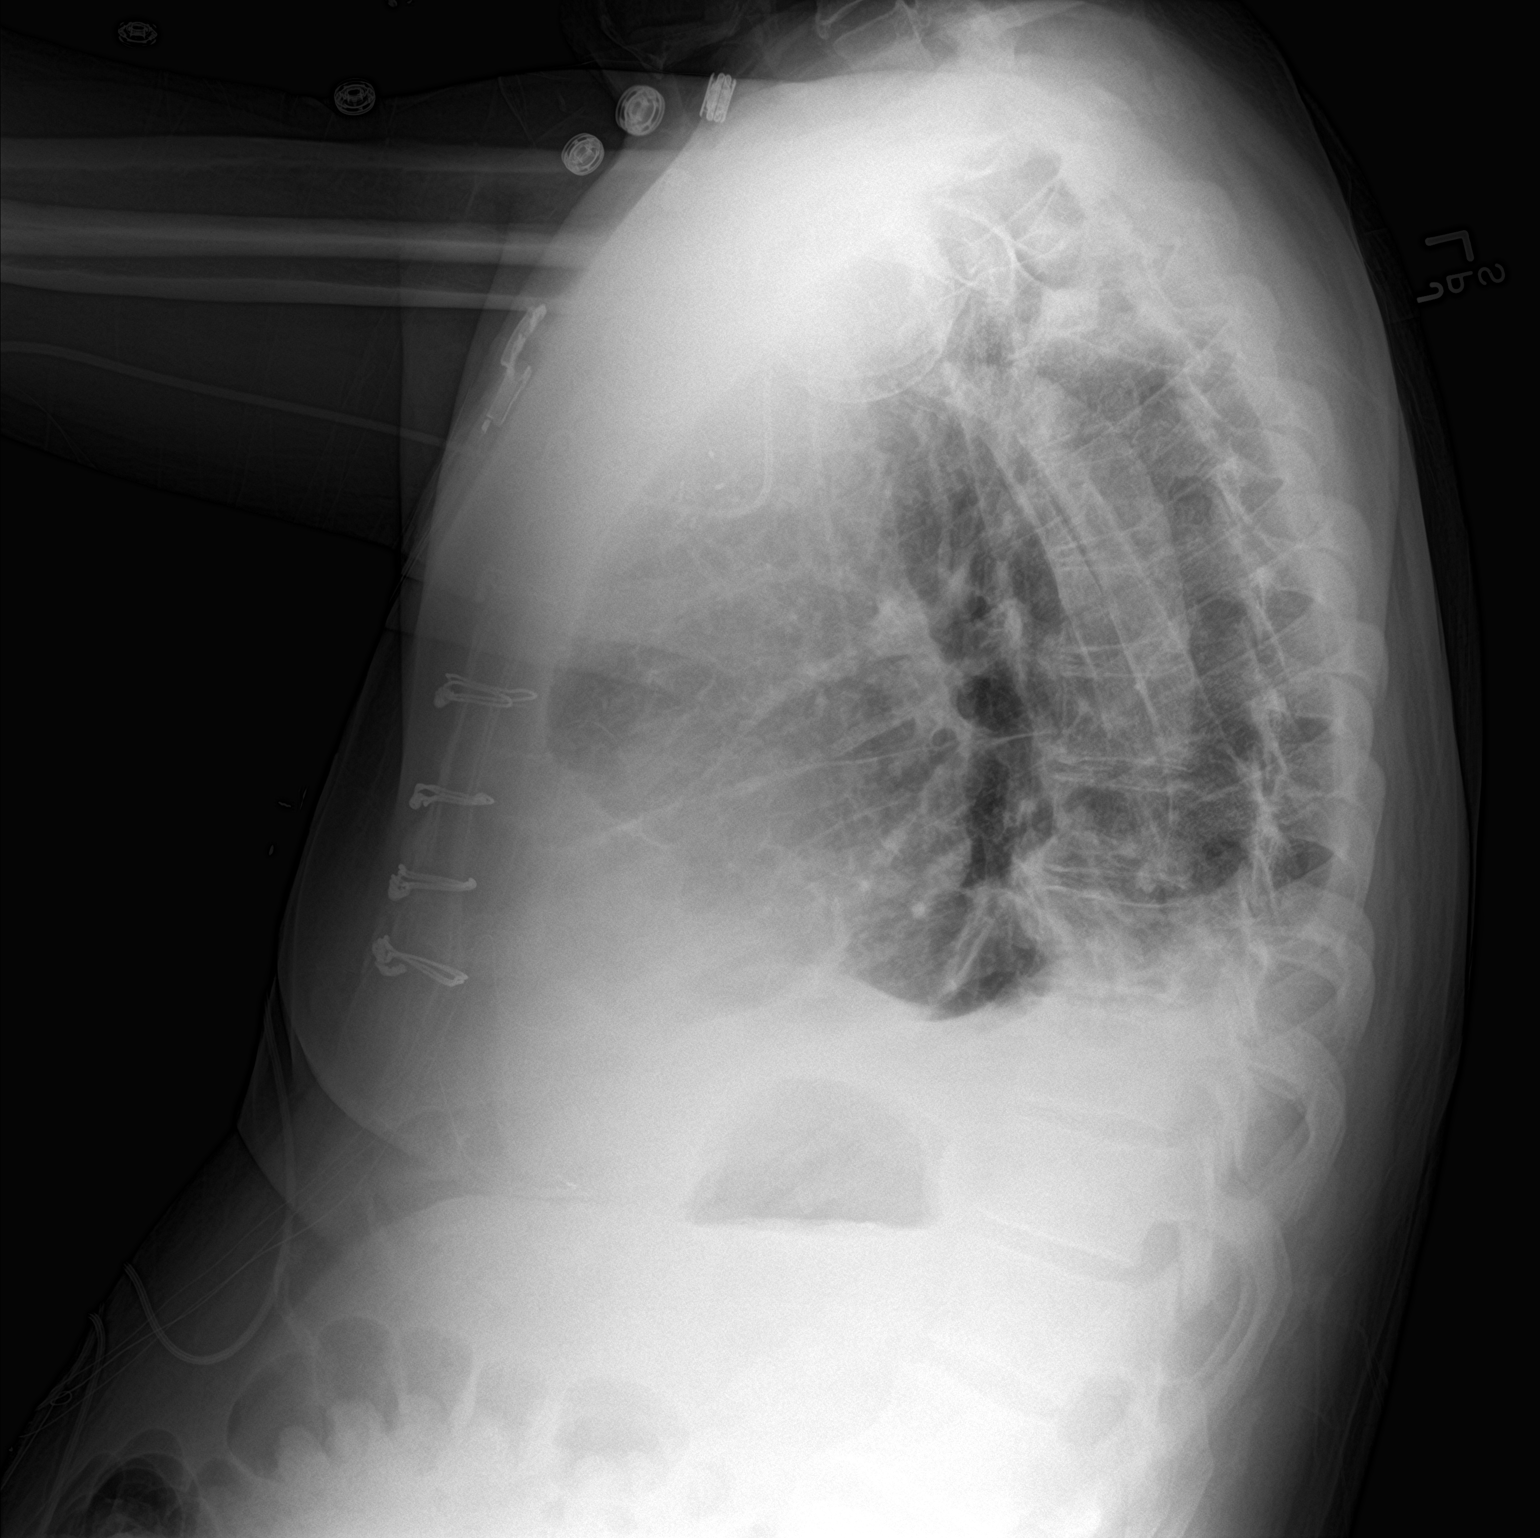

[2 of 2 positions shown; findings below may reference images not displayed]

FINDINGS: There is a right upper extremity PICC with tip terminating in the
superior cavoatrial junction. Lung volumes are low. There are
bibasilar opacities most compatible with resolving areas of
postoperative subsegmental atelectasis. Superimposed small to
moderate bilateral pleural effusions appear similar to slightly
decreased compared with prior examination from 10/10/2016. No
pneumothorax. The possibility of airspace consolidation in the lung
bases is not excluded, but is not favored. No evidence of pulmonary
edema. Cardiopericardial silhouette is upper limits of normal. Upper
mediastinal contours are normal. Aortic atherosclerosis. Status post
median sternotomy for. Epicardial pacing wires are noted.
IMPRESSION: 1. Slightly improved lung volumes in slightly improved aeration in
the lung bases. There continues to be small to moderate bilateral
pleural effusions with resolving postoperative subsegmental
atelectasis throughout the lung bases bilaterally.
2. Aortic atherosclerosis.
Aortic Atherosclerosis ([id number]).

## 2018-03-25 DIAGNOSIS — H6122 Impacted cerumen, left ear: Secondary | ICD-10-CM | POA: Diagnosis not present

## 2018-03-25 DIAGNOSIS — R05 Cough: Secondary | ICD-10-CM | POA: Diagnosis not present

## 2018-03-25 DIAGNOSIS — E669 Obesity, unspecified: Secondary | ICD-10-CM | POA: Diagnosis not present

## 2018-03-25 DIAGNOSIS — Z23 Encounter for immunization: Secondary | ICD-10-CM | POA: Diagnosis not present

## 2018-04-15 ENCOUNTER — Other Ambulatory Visit: Payer: Self-pay | Admitting: Cardiology

## 2018-05-09 ENCOUNTER — Encounter (HOSPITAL_COMMUNITY): Payer: Self-pay | Admitting: Emergency Medicine

## 2018-05-09 ENCOUNTER — Observation Stay (HOSPITAL_BASED_OUTPATIENT_CLINIC_OR_DEPARTMENT_OTHER): Payer: Medicare Other

## 2018-05-09 ENCOUNTER — Emergency Department (HOSPITAL_COMMUNITY): Payer: Medicare Other

## 2018-05-09 ENCOUNTER — Observation Stay (HOSPITAL_COMMUNITY)
Admission: EM | Admit: 2018-05-09 | Discharge: 2018-05-10 | Disposition: A | Discharge status: Home / Self Care | Payer: Medicare Other | Attending: Emergency Medicine | Admitting: Emergency Medicine

## 2018-05-09 ENCOUNTER — Other Ambulatory Visit: Payer: Self-pay

## 2018-05-09 DIAGNOSIS — E785 Hyperlipidemia, unspecified: Secondary | ICD-10-CM

## 2018-05-09 DIAGNOSIS — I252 Old myocardial infarction: Secondary | ICD-10-CM | POA: Insufficient documentation

## 2018-05-09 DIAGNOSIS — K219 Gastro-esophageal reflux disease without esophagitis: Secondary | ICD-10-CM

## 2018-05-09 DIAGNOSIS — Z9889 Other specified postprocedural states: Secondary | ICD-10-CM

## 2018-05-09 DIAGNOSIS — I110 Hypertensive heart disease with heart failure: Secondary | ICD-10-CM | POA: Insufficient documentation

## 2018-05-09 DIAGNOSIS — Z951 Presence of aortocoronary bypass graft: Secondary | ICD-10-CM | POA: Insufficient documentation

## 2018-05-09 DIAGNOSIS — E669 Obesity, unspecified: Secondary | ICD-10-CM | POA: Insufficient documentation

## 2018-05-09 DIAGNOSIS — Z87891 Personal history of nicotine dependence: Secondary | ICD-10-CM | POA: Diagnosis not present

## 2018-05-09 DIAGNOSIS — I5031 Acute diastolic (congestive) heart failure: Secondary | ICD-10-CM | POA: Insufficient documentation

## 2018-05-09 DIAGNOSIS — R072 Precordial pain: Secondary | ICD-10-CM | POA: Diagnosis not present

## 2018-05-09 DIAGNOSIS — I25708 Atherosclerosis of coronary artery bypass graft(s), unspecified, with other forms of angina pectoris: Secondary | ICD-10-CM

## 2018-05-09 DIAGNOSIS — I4510 Unspecified right bundle-branch block: Secondary | ICD-10-CM | POA: Diagnosis not present

## 2018-05-09 DIAGNOSIS — I2510 Atherosclerotic heart disease of native coronary artery without angina pectoris: Secondary | ICD-10-CM | POA: Insufficient documentation

## 2018-05-09 DIAGNOSIS — R739 Hyperglycemia, unspecified: Secondary | ICD-10-CM

## 2018-05-09 DIAGNOSIS — R079 Chest pain, unspecified: Principal | ICD-10-CM | POA: Diagnosis present

## 2018-05-09 DIAGNOSIS — I119 Hypertensive heart disease without heart failure: Secondary | ICD-10-CM | POA: Diagnosis not present

## 2018-05-09 DIAGNOSIS — I10 Essential (primary) hypertension: Secondary | ICD-10-CM | POA: Diagnosis not present

## 2018-05-09 LAB — COMPREHENSIVE METABOLIC PANEL
ALK PHOS: 88 U/L (ref 38–126)
ALT: 16 U/L (ref 0–44)
AST: 19 U/L (ref 15–41)
Albumin: 3.5 g/dL (ref 3.5–5.0)
Anion gap: 8 (ref 5–15)
BUN: 26 mg/dL — AB (ref 8–23)
CALCIUM: 8.5 mg/dL — AB (ref 8.9–10.3)
CO2: 24 mmol/L (ref 22–32)
Chloride: 107 mmol/L (ref 98–111)
Creatinine, Ser: 1.19 mg/dL (ref 0.61–1.24)
Glucose, Bld: 129 mg/dL — ABNORMAL HIGH (ref 70–99)
Potassium: 3.7 mmol/L (ref 3.5–5.1)
Sodium: 139 mmol/L (ref 135–145)
Total Bilirubin: 0.6 mg/dL (ref 0.3–1.2)
Total Protein: 6.9 g/dL (ref 6.5–8.1)

## 2018-05-09 LAB — CBC WITH DIFFERENTIAL/PLATELET
Abs Immature Granulocytes: 0.02 10*3/uL (ref 0.00–0.07)
Basophils Absolute: 0 10*3/uL (ref 0.0–0.1)
Basophils Relative: 0 %
EOS PCT: 2 %
Eosinophils Absolute: 0.2 10*3/uL (ref 0.0–0.5)
HCT: 41.9 % (ref 39.0–52.0)
Hemoglobin: 13 g/dL (ref 13.0–17.0)
Immature Granulocytes: 0 %
Lymphocytes Relative: 15 %
Lymphs Abs: 1.5 10*3/uL (ref 0.7–4.0)
MCH: 26.9 pg (ref 26.0–34.0)
MCHC: 31 g/dL (ref 30.0–36.0)
MCV: 86.7 fL (ref 80.0–100.0)
MONO ABS: 0.6 10*3/uL (ref 0.1–1.0)
MONOS PCT: 6 %
Neutro Abs: 7.5 10*3/uL (ref 1.7–7.7)
Neutrophils Relative %: 77 %
PLATELETS: 203 10*3/uL (ref 150–400)
RBC: 4.83 MIL/uL (ref 4.22–5.81)
RDW: 14.3 % (ref 11.5–15.5)
WBC: 9.9 10*3/uL (ref 4.0–10.5)
nRBC: 0 % (ref 0.0–0.2)

## 2018-05-09 LAB — ECHOCARDIOGRAM COMPLETE
Height: 72 in
Weight: 4112 oz

## 2018-05-09 LAB — TROPONIN I
Troponin I: <0.03 ng/mL (ref <0.03)
Troponin I: <0.03 ng/mL (ref <0.03)
Troponin I: <0.03 ng/mL (ref <0.03)

## 2018-05-09 LAB — LIPASE, BLOOD: LIPASE: 27 U/L (ref 11–51)

## 2018-05-09 LAB — HEMOGLOBIN A1C
Hgb A1c MFr Bld: 5.9 % — ABNORMAL HIGH (ref 4.8–5.6)
MEAN PLASMA GLUCOSE: 122.63 mg/dL

## 2018-05-09 MED ORDER — NITROGLYCERIN 0.4 MG SL SUBL: 0.4000 mg | SUBLINGUAL_TABLET | SUBLINGUAL | Status: DC | PRN

## 2018-05-09 MED ORDER — HEPARIN SODIUM (PORCINE) 5000 UNIT/ML IJ SOLN
5000.0000 [IU] | Freq: Three times a day (TID) | INTRAMUSCULAR | Status: DC
  Administered 2018-05-09 – 2018-05-10 (×4): 5000 [IU] via SUBCUTANEOUS
  Filled 2018-05-09 (×4): qty 1

## 2018-05-09 MED ORDER — ASPIRIN 325 MG PO TABS
325.0000 mg | ORAL_TABLET | Freq: Once | ORAL | Status: AC
  Administered 2018-05-09: 325 mg via ORAL
  Filled 2018-05-09: qty 1

## 2018-05-09 MED ORDER — ASPIRIN EC 81 MG PO TBEC
81.0000 mg | DELAYED_RELEASE_TABLET | Freq: Every day | ORAL | Status: DC
  Administered 2018-05-10: 81 mg via ORAL
  Filled 2018-05-09 (×2): qty 1

## 2018-05-09 MED ORDER — MORPHINE SULFATE (PF) 2 MG/ML IV SOLN: 2.0000 mg | INTRAVENOUS | Status: DC | PRN

## 2018-05-09 MED ORDER — ACETAMINOPHEN 325 MG PO TABS
650.0000 mg | ORAL_TABLET | ORAL | Status: DC | PRN
  Administered 2018-05-09: 650 mg via ORAL
  Filled 2018-05-09: qty 2

## 2018-05-09 MED ORDER — PANTOPRAZOLE SODIUM 40 MG PO TBEC
40.0000 mg | DELAYED_RELEASE_TABLET | Freq: Two times a day (BID) | ORAL | Status: DC
  Administered 2018-05-09 – 2018-05-10 (×2): 40 mg via ORAL
  Filled 2018-05-09 (×2): qty 1

## 2018-05-09 MED ORDER — LIDOCAINE VISCOUS HCL 2 % MT SOLN
15.0000 mL | Freq: Once | OROMUCOSAL | Status: AC
  Administered 2018-05-09: 15 mL via ORAL
  Filled 2018-05-09: qty 15

## 2018-05-09 MED ORDER — ONDANSETRON HCL 4 MG/2ML IJ SOLN: 4.0000 mg | Freq: Four times a day (QID) | INTRAMUSCULAR | Status: DC | PRN

## 2018-05-09 MED ORDER — SODIUM CHLORIDE 0.9 % IV SOLN
INTRAVENOUS | Status: DC
  Administered 2018-05-09 – 2018-05-10 (×2): via INTRAVENOUS

## 2018-05-09 MED ORDER — ALUM & MAG HYDROXIDE-SIMETH 200-200-20 MG/5ML PO SUSP
30.0000 mL | Freq: Once | ORAL | Status: AC
  Administered 2018-05-09: 30 mL via ORAL
  Filled 2018-05-09: qty 30

## 2018-05-09 MED ORDER — METOPROLOL TARTRATE 25 MG PO TABS
25.0000 mg | ORAL_TABLET | Freq: Two times a day (BID) | ORAL | Status: DC
  Administered 2018-05-09 – 2018-05-10 (×2): 25 mg via ORAL
  Filled 2018-05-09 (×3): qty 1

## 2018-05-09 MED ORDER — NITROGLYCERIN 2 % TD OINT
1.0000 [in_us] | TOPICAL_OINTMENT | Freq: Once | TRANSDERMAL | Status: AC
  Administered 2018-05-09: 1 [in_us] via TOPICAL
  Filled 2018-05-09: qty 1

## 2018-05-09 MED ORDER — ATORVASTATIN CALCIUM 20 MG PO TABS
20.0000 mg | ORAL_TABLET | Freq: Every day | ORAL | Status: DC
  Administered 2018-05-09: 20 mg via ORAL
  Filled 2018-05-09: qty 1

## 2018-05-09 NOTE — ED Triage Notes (Signed)
Pt with c/o centralized chest pain that woke him from sleep this morning around 0430. Pt describes pain as pressure like and non radiating.

## 2018-05-09 NOTE — Care Management Obs Status (Signed)
[facility] NOTIFICATION   Patient Details  Name: Charles [name] MRN: [id number] Date of Birth: [birth year]   Medicare Observation Status Notification Given:  Yes    Sherald Barge, RN 05/09/2018, 1:25 PM

## 2018-05-09 NOTE — ED Notes (Signed)
edp in room  

## 2018-05-09 NOTE — ED Provider Notes (Signed)
Emergency Department Provider Note   I have reviewed the triage vital signs and the nursing notes.   HISTORY  Chief Complaint Chest Pain   HPI Charles [name] is a 73 y.o. male with PMH of AS s/p repair, CAD s/p CABG, and endocarditis presents to the emergency department for evaluation of central chest pressure which he noticed upon waking at 4:30 AM.  States this is his normal time to wake up but noticed the chest pressure which was nonradiating but abnormal.  Symptoms did not resolve.  He denies any shortness of breath, nausea.  No prior history of similar pain.  He has been taking all of his medications with no changes.  No fevers or chills.  No cough.  His primary cardiologist is Dr. Domenic Polite.  No abdominal discomfort, nausea, vomiting, diarrhea.  Past Medical History:  Diagnosis Date  . Anemia   . Aortic insufficiency    a. 07/07/2016: TEE showing bicuspid aortic valve with severe eccentric AI   . Ascending aortic aneurysm (HCC)    4.4 cm by CT 06/2016  . 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 Bicuspid aortic valve   . Chronic periodontitis   . Coronary artery disease   . Pneumonia 1978  . 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 S/P patent foramen ovale closure 10/02/2016    Patient Active Problem List   Diagnosis Date Noted  . Chest pain 05/09/2018  . Cellulitis 10/26/2017  . CHF (congestive heart failure) ([place])   . S/P AVR   . Acute blood loss anemia   . Tachypnea   . Ventricular tachycardia ([place])   . 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 S/P patent foramen ovale closure 10/02/2016  . S/P aortic valve  allograft 10/02/2016  . Insomnia 09/27/2016  . Acute diastolic CHF (congestive heart failure), NYHA class 4 ([place]) 09/25/2016  . CHF (congestive heart failure), NYHA class II ([place]) 09/24/2016  . Aortic valve disorder   . Coronary artery disease involving native heart without angina pectoris   . Second degree AV block, Mobitz type I 07/08/2016  . Weight loss   . Bacterial endocarditis   . Protein-calorie malnutrition, severe 07/03/2016  . Anemia 07/02/2016  . H/O cardiac murmur 07/02/2016  . Ascending aortic aneurysm ([place]) 07/02/2016  . NSTEMI (non-ST elevated myocardial infarction) ([place]) 07/02/2016  . Bicuspid aortic valve 07/02/2016  . Aortic insufficiency 07/02/2016    Past Surgical History:  Procedure Laterality Date  . AORTIC VALVE REPLACEMENT N/A 10/02/2016   Procedure: AORTIC ROOT REPLACEMENT  - using 62mm Allograft;  Surgeon: [name], MD;  Location: [facility];  Service: Open Heart Surgery;  Laterality: N/A;  . COLONOSCOPY N/A 12/22/2017   Procedure: COLONOSCOPY;  Surgeon: Daneil Dolin, MD;  [facility];  Service: Endoscopy;  Laterality: N/A;  2:00pm - refused to move up  . CORONARY ARTERY BYPASS GRAFT N/A 10/02/2016   Procedure: CORONARY ARTERY BYPASS GRAFTING (CABG) x one, using right leg greater saphenous vein harvested endoscopically;  Surgeon: [name], MD;  Location: [facility];  Service: Open Heart Surgery;  Laterality:  N/A;  . FALSE ANEURYSM REPAIR  10/02/2016   Procedure: REPAIR FALSE ANEURYSM of Aortic Root Resection and Grafting of Aortic Root;  Surgeon: [name], MD;  Location: [facility];  Service: Open Heart Surgery;;  . HERNIA REPAIR Left   . LEFT HEART CATH AND CORS/GRAFTS ANGIOGRAPHY N/A 10/07/2016   Procedure: Left Heart Cath and Cors/Grafts Angiography;  Surgeon: Burnell Blanks, MD;  Location: [facility];  Service: Cardiovascular;  Laterality: N/A;  . MULTIPLE EXTRACTIONS WITH ALVEOLOPLASTY N/A 08/13/2016   Procedure:  Extraction of tooth #'s 2,4,14 with alveoloplasty;  Surgeon: Lenn Cal, DDS;  Location: [facility];  Service: Oral Surgery;  Laterality: N/A;  . PATENT FORAMEN OVALE(PFO) CLOSURE N/A 10/02/2016   Procedure: PATENT FORAMEN OVALE (PFO) CLOSURE;  Surgeon: [name], MD;  Location: [facility];  Service: Open Heart Surgery;  Laterality: N/A;  . RIGHT/LEFT HEART CATH AND CORONARY ANGIOGRAPHY N/A 08/03/2016   Procedure: Right/Left Heart Cath and Coronary Angiography;  Surgeon: Burnell Blanks, MD;  Location: [place] [facility];  Service: Cardiovascular;  Laterality: N/A;  . TEE WITHOUT CARDIOVERSION N/A 07/07/2016   Procedure: TRANSESOPHAGEAL ECHOCARDIOGRAM (TEE);  Surgeon: Larey Dresser, MD;  Location: [facility];  Service: Cardiovascular;  Laterality: N/A;  . TEE WITHOUT CARDIOVERSION N/A 10/02/2016   Procedure: TRANSESOPHAGEAL ECHOCARDIOGRAM (TEE);  Surgeon: [name], MD;  Location: [place];  Service: Open Heart Surgery;  Laterality: N/A;  . THORACIC AORTIC ANEURYSM REPAIR N/A 10/02/2016   Procedure: THORACIC ASCENDING ANEURYSM REPAIR  - using 83mm Allograft;  Reimplantation of Left Coronary Button;  Surgeon: [name], MD;  Location: [facility];  Service: Open Heart Surgery;  Laterality: N/A;    Allergies Lisinopril  Family History  Problem Relation Age of Onset  . Ovarian cancer Mother   . Heart disease Father 90       Died of coronary thrombosis.    . Colon cancer Neg Hx     Social History Social History   Tobacco Use  . Smoking status: Former Smoker    Types: Cigarettes    Last attempt to quit: 05/01/1991    Years since quitting: 27.0  . Smokeless tobacco: Never Used  . Tobacco comment: Quit 30 years ago.    Substance Use Topics  . Alcohol use: No  . Drug use: No    Review of Systems  Constitutional: No fever/chills Eyes: No visual changes. ENT: No sore throat. Cardiovascular: Positive chest pain. Respiratory: Denies shortness of  breath. Gastrointestinal: No abdominal pain.  No nausea, no vomiting.  No diarrhea.  No constipation. Genitourinary: Negative for dysuria. Musculoskeletal: Negative for back pain. Skin: Negative for rash. Neurological: Negative for headaches, focal weakness or numbness.  10-point ROS otherwise negative.  ____________________________________________   PHYSICAL EXAM:  VITAL SIGNS: ED Triage Vitals  Enc Vitals Group     BP 05/09/18 0633 (!) 147/74     Pulse Rate 05/09/18 0629 (!) 59     Resp 05/09/18 0629 20     Temp --      Temp Source 05/09/18 0629 Oral     SpO2 05/09/18 0629 100 %     Weight 05/09/18 0624 257 lb (116.6 kg)     Height 05/09/18 0624 6' (1.829 m)     Pain Score 05/09/18 0623 4   Constitutional: Alert and oriented. Well appearing and in no acute distress. Eyes: Conjunctivae are normal.  Head: Atraumatic. Nose: No congestion/rhinnorhea. Mouth/Throat: Mucous membranes are moist.  Neck: No stridor.  Cardiovascular: Normal rate, regular rhythm. Good peripheral circulation. Grossly normal heart sounds.   Respiratory: Normal respiratory effort.  No retractions. Lungs CTAB. Gastrointestinal: Soft and nontender. No distention.  Musculoskeletal: No lower extremity tenderness with trace bilateral LE edema. No gross deformities of extremities. Neurologic:  Normal speech and language. No gross focal neurologic deficits are appreciated.  Skin:  Skin is warm, dry and intact. No rash noted.  ____________________________________________   LABS (all labs ordered are listed, but only abnormal results are displayed)  Labs Reviewed  COMPREHENSIVE METABOLIC PANEL - Abnormal; Notable for the following components:      Result Value   Glucose, Bld 129 (*)    BUN 26 (*)    Calcium 8.5 (*)    All other components within normal limits  LIPASE, BLOOD  CBC WITH DIFFERENTIAL/PLATELET  TROPONIN I  TROPONIN I  HIV ANTIBODY (ROUTINE TESTING W REFLEX)  TROPONIN I  TROPONIN I   HEMOGLOBIN A1C   ____________________________________________  EKG   EKG Interpretation  Date/Time:  Monday May 09 2018 61:44:31 EST Ventricular Rate:  59 PR Interval:    QRS Duration: 169 QT Interval:  469 QTC Calculation: 465 R Axis:   -59 Text Interpretation:  Sinus rhythm Prolonged PR interval RBBB and LAFB Left ventricular hypertrophy No significant change since last tracing 08 Oct 2016 Confirmed by Rolland Porter [phone]) on 05/09/2018 6:44:11 AM       ____________________________________________  RADIOLOGY  Dg Chest 1 View  Result Date: 05/09/2018 CLINICAL DATA:  Chest pain EXAM: CHEST  1 VIEW COMPARISON:  11/23/2016 FINDINGS: Cardiomegaly and aortic tortuosity. Prior median sternotomy. There is no edema, consolidation, effusion, or pneumothorax. IMPRESSION: Stable from prior.  No evidence of active disease. Electronically Signed   By: Monte Fantasia M.D.   On: 05/09/2018 07:08    ____________________________________________   PROCEDURES  Procedure(s) performed:   Procedures  None ____________________________________________   INITIAL IMPRESSION / ASSESSMENT AND PLAN / ED COURSE  Pertinent labs & imaging results that were available during my care of the patient were reviewed by me and considered in my medical decision making (see chart for details).  Patient presents to the emergency department for evaluation of central chest pressure without radiation.  He was given aspirin and nitroglycerin ointment on arrival and chest pain is resolving.  He does have history of CAD with CABG and stenotic aortic valve status post repair.  He is well-appearing.  Patient is high risk for acute coronary syndrome.  Plan for chest x-ray and labs including troponin.  EKG reviewed with no evidence of acute ischemia as above.   CXR and labs reviewed. Negative troponin x 1. Patient high-risk for ACS. Plan for admit for obs. Consulted Cardiology who will be down to evaluate.    Discussed patient's case with Hospitalist to request admission. Patient and family (if present) updated with plan. Care transferred to Hospitalist service.  I reviewed all nursing notes, vitals, pertinent old records, EKGs, labs, imaging (as available).  ____________________________________________  FINAL CLINICAL IMPRESSION(S) / ED DIAGNOSES  Final diagnoses:  Precordial chest pain     MEDICATIONS GIVEN DURING THIS VISIT:  Medications  acetaminophen (TYLENOL) tablet 650 mg (has no administration in time range)  ondansetron (ZOFRAN) injection 4 mg (has no administration in time range)  0.9 %  sodium chloride infusion ( Intravenous New Bag/Given 05/09/18 0923)  heparin injection 5,000 Units (5,000 Units Subcutaneous Given 05/09/18 1326)  morphine 2 MG/ML injection 2 mg (has no administration  in time range)  aspirin EC tablet 81 mg (81 mg Oral Not Given 05/09/18 0916)  atorvastatin (LIPITOR) tablet 20 mg (has no administration in time range)  metoprolol tartrate (LOPRESSOR) tablet 25 mg (25 mg Oral Not Given 05/09/18 0916)  aspirin tablet 325 mg (325 mg Oral Given 05/09/18 0654)  nitroGLYCERIN (NITROGLYN) 2 % ointment 1 inch (1 inch Topical Given 05/09/18 0654)  alum & mag hydroxide-simeth (MAALOX/MYLANTA) 200-200-20 MG/5ML suspension 30 mL (30 mLs Oral Given 05/09/18 0917)    And  lidocaine (XYLOCAINE) 2 % viscous mouth solution 15 mL (15 mLs Oral Given 05/09/18 0917)    Note:  This document was prepared using Dragon voice recognition software and may include unintentional dictation errors.  Nanda Quinton, MD Emergency Medicine    Aarion Metzgar, [facility], MD 05/09/18 [phone]

## 2018-05-09 NOTE — Consult Note (Addendum)
Cardiology Consultation:   Patient ID: Charles [name] MRN: [id number]; DOB: 1945/07/27  Admit date: 05/09/2018 Date of Consult: 05/09/2018  Primary Care Provider: [name], MD Primary Cardiologist: Rozann Lesches, MD  Primary Electrophysiologist:  None    Patient Profile:   Charles [name] is a 73 y.o. male with a hx of CAD who is being seen today for the evaluation of chest pain at the request of Dr. Dyann Kief.  History of Present Illness:   Charles [name] is a 74 yr old male patient with history of CAD S/P CABG x1, Aortic root replacement for bacterial endocarditis , ascending thoracic aortic aneurysm repair & patent foramen ovale closure 10/02/16. History of cardiomyopathy with normalization of LVEF 01/2017. F/U cath 10/07/2016 Patent SVG-PDA. No changes.  Patient awakened at 4:30 am like he usually does but noticed a chest tightness left chest-different from prior angina. No radiation or shortness of breath. Came to ER and eased with NTG and ASA. Exercises at [facility] 3 times a week without symptoms. Ate at a church buffet last night-heavy, fatty foods and venison which he's never had. Also has been doing some chest stretching exercises to help break up the scar tissue from surgery but he says it's very light stretching.  Past Medical History:  Diagnosis Date  . Anemia   . Aortic insufficiency    a. 07/07/2016: TEE showing bicuspid aortic valve with severe eccentric AI   . Ascending aortic aneurysm (HCC)    4.4 cm by CT 06/2016  . 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 Bicuspid aortic valve   . Chronic periodontitis   . Coronary artery disease   . Pneumonia 1978  . 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 S/P CABG x 1 10/02/2016   SVG to  RCA with EVH via right thigh  . S/P patent foramen ovale closure 10/02/2016    Past Surgical History:  Procedure Laterality Date  . AORTIC VALVE REPLACEMENT N/A 10/02/2016   Procedure: AORTIC ROOT REPLACEMENT  - using 41mm Allograft;  Surgeon: [name], MD;  Location: [place];  Service: Open Heart Surgery;  Laterality: N/A;  . COLONOSCOPY N/A 12/22/2017   Procedure: COLONOSCOPY;  Surgeon: Daneil Dolin, MD;  [facility];  Service: Endoscopy;  Laterality: N/A;  2:00pm - refused to move up  . CORONARY ARTERY BYPASS GRAFT N/A 10/02/2016   Procedure: CORONARY ARTERY BYPASS GRAFTING (CABG) x one, using right leg greater saphenous vein harvested endoscopically;  Surgeon: [name], MD;  Location: [place];  Service: Open Heart Surgery;  Laterality: N/A;  . FALSE ANEURYSM REPAIR  10/02/2016   Procedure: REPAIR FALSE ANEURYSM of Aortic Root Resection and Grafting of Aortic Root;  Surgeon: [name], MD;  Location: [place];  Service: Open Heart Surgery;;  . HERNIA REPAIR Left   . LEFT HEART CATH AND CORS/GRAFTS ANGIOGRAPHY N/A 10/07/2016   Procedure: Left Heart Cath and Cors/Grafts Angiography;  Surgeon: Burnell Blanks, MD;  Location: [place] [facility];  Service: Cardiovascular;  Laterality: N/A;  . MULTIPLE EXTRACTIONS WITH ALVEOLOPLASTY N/A 08/13/2016   Procedure: Extraction of tooth #'s 2,4,14 with alveoloplasty;  Surgeon: Lenn Cal, DDS;  Location: [place];  Service: Oral Surgery;  Laterality: N/A;  .  PATENT FORAMEN OVALE(PFO) CLOSURE N/A 10/02/2016   Procedure: PATENT FORAMEN OVALE (PFO) CLOSURE;  Surgeon: [name], MD;  Location: [place];  Service: Open Heart Surgery;  Laterality: N/A;  . RIGHT/LEFT HEART CATH AND CORONARY ANGIOGRAPHY N/A 08/03/2016   Procedure: Right/Left Heart Cath and Coronary Angiography;  Surgeon: Burnell Blanks, MD;  Location: [place] [facility];  Service: Cardiovascular;  Laterality: N/A;  . TEE WITHOUT CARDIOVERSION N/A  07/07/2016   Procedure: TRANSESOPHAGEAL ECHOCARDIOGRAM (TEE);  Surgeon: Larey Dresser, MD;  Location: [place];  Service: Cardiovascular;  Laterality: N/A;  . TEE WITHOUT CARDIOVERSION N/A 10/02/2016   Procedure: TRANSESOPHAGEAL ECHOCARDIOGRAM (TEE);  Surgeon: [name], MD;  Location: [place];  Service: Open Heart Surgery;  Laterality: N/A;  . THORACIC AORTIC ANEURYSM REPAIR N/A 10/02/2016   Procedure: THORACIC ASCENDING ANEURYSM REPAIR  - using 78mm Allograft;  Reimplantation of Left Coronary Button;  Surgeon: [name], MD;  Location: [place];  Service: Open Heart Surgery;  Laterality: N/A;     Home Medications:  Prior to Admission medications   Medication Sig Start Date End Date Taking? Authorizing Provider  amoxicillin (AMOXIL) 500 MG capsule TAKE 4 CAPSULES BY MOUTH 1 HOUR PRIOR TO PROCEDURE 03/15/18  Yes Satira Sark, MD  amoxicillin (AMOXIL) 500 MG tablet Take 2,000 mg by mouth See admin instructions. Take 2000 mg by mouth 1 hour prior to dental or invasive procedure   Yes [provider]  aspirin EC 81 MG tablet Take 81 mg by mouth daily.   Yes [provider]  atorvastatin (LIPITOR) 20 MG tablet TAKE 1 TABLET(20 MG) BY MOUTH DAILY Patient taking differently: Take 20 mg by mouth every evening.  07/15/17  Yes Satira Sark, MD  furosemide (LASIX) 20 MG tablet Take 20 mg by mouth daily.   Yes [provider]  loratadine-pseudoephedrine (CLARITIN-D 24-HOUR) 10-240 MG 24 hr tablet Take 1 tablet by mouth daily as needed for allergies.    Yes [provider]  metoprolol tartrate (LOPRESSOR) 25 MG tablet TAKE 1 TABLET BY MOUTH TWICE DAILY. HOLD FOR HR LESS THAN 50 04/15/18  Yes Satira Sark, MD  Multiple Vitamin (MULTIVITAMIN WITH MINERALS) TABS tablet Take 1 tablet by mouth daily.   Yes [provider]  nitroGLYCERIN (NITROSTAT) 0.4 MG SL tablet Place 1 tablet (0.4 mg total) under the tongue every 5 (five) minutes x 3 doses  as needed for chest pain (if no relief after 3rd dose, proceed to the ED for an evaluation). 10/13/17  Yes Satira Sark, MD  potassium chloride (K-DUR) 10 MEQ tablet Take 1 tablet (10 mEq total) by mouth daily. 05/06/17 05/09/18 Yes Satira Sark, MD  ferrous sulfate 325 (65 FE) MG tablet Take 325 mg by mouth daily with breakfast.    [provider]    Inpatient Medications: Scheduled Meds: . alum & mag hydroxide-simeth  30 mL Oral Once   And  . lidocaine  15 mL Oral Once  . aspirin EC  81 mg Oral Daily  . atorvastatin  20 mg Oral q1800  . heparin  5,000 Units Subcutaneous Q8H  . metoprolol tartrate  25 mg Oral BID   Continuous Infusions: . sodium chloride     PRN Meds: acetaminophen, morphine injection, ondansetron (ZOFRAN) IV  Allergies:    Allergies  Allergen Reactions  . No Known Allergies     Social History:   Social History   Socioeconomic History  . Marital status: Widowed  Spouse name: Not on file  . Number of children: 3  . Years of education: Not on file  . Highest education level: Not on file  Occupational History  . Not on file  Social Needs  . Financial resource strain: Not on file  . Food insecurity:    Worry: Not on file    Inability: Not on file  . Transportation needs:    Medical: Not on file    Non-medical: Not on file  Tobacco Use  . Smoking status: Former Smoker    Types: Cigarettes    Last attempt to quit: 05/01/1991    Years since quitting: 27.0  . Smokeless tobacco: Never Used  . Tobacco comment: Quit 30 years ago.    Substance and Sexual Activity  . Alcohol use: No  . Drug use: No  . Sexual activity: Not on file  Lifestyle  . Physical activity:    Days per week: Not on file    Minutes per session: Not on file  . Stress: Not on file  Relationships  . Social connections:    Talks on phone: Not on file    Gets together: Not on file    Attends religious service: Not on file    Active member of club or  organization: Not on file    Attends meetings of clubs or organizations: Not on file    Relationship status: Not on file  . Intimate partner violence:    Fear of current or ex partner: Not on file    Emotionally abused: Not on file    Physically abused: Not on file    Forced sexual activity: Not on file  Other Topics Concern  . Not on file  Social History Narrative  . Not on file    Family History:    Family History  Problem Relation Age of Onset  . Ovarian cancer Mother   . Heart disease Father 80       Died of coronary thrombosis.    . Colon cancer Neg Hx      ROS:  Please see the history of present illness.  Review of Systems  Constitution: Negative.  HENT: Negative.   Cardiovascular: Positive for chest pain.  Respiratory: Positive for cough.        Occasional cough since on lisinopril in the past  Endocrine: Negative.   Hematologic/Lymphatic: Negative.   Musculoskeletal: Negative.   Gastrointestinal: Negative.   Genitourinary: Negative.   Neurological: Negative.     All other ROS reviewed and negative.     Physical Exam/Data:   Vitals:   05/09/18 0759 05/09/18 0800 05/09/18 0815 05/09/18 0830  BP: 106/67 106/67  114/71  Pulse: (!) 58 61 (!) 57 (!) 57  Resp: 16 10 15 16   TempSrc:      SpO2: 93% 95% 94% 95%  Weight:      Height:       No intake or output data in the 24 hours ending 05/09/18 0901 Last 3 Weights 05/09/2018 12/16/2017 11/01/2017  Weight (lbs) 257 lb 252 lb 245 lb  Weight (kg) 116.574 kg 114.306 kg 111.131 kg     Body mass index is 34.86 kg/m.  General:  Well nourished, well developed, in no acute distress HEENT: normal Lymph: no adenopathy Neck: no JVD Endocrine:  No thryomegaly Vascular: No carotid bruits; FA pulses 2+ bilaterally without bruits  Cardiac:  normal S1, S2; RRR;distant heart sounds, no murmur   Lungs:  clear to auscultation bilaterally, no wheezing,  rhonchi or rales  Abd: soft, nontender, no hepatomegaly  Ext: no  edema Musculoskeletal:  No deformities, BUE and BLE strength normal and equal Skin: warm and dry  Neuro:  CNs 2-12 intact, no focal abnormalities noted Psych:  Normal affect   EKG:  The EKG was personally reviewed and demonstrates:  NSR with RBBB and LVH Telemetry:  Telemetry was personally reviewed and demonstrates: NSR  Relevant CV Studies:   Prox Cx to Mid Cx lesion, 30 %stenosed.  Mid LAD lesion, 20 %stenosed.  Ost 2nd Diag lesion, 20 %stenosed.  Dist LAD lesion, 40 %stenosed.  Ost RCA lesion, 100 %stenosed.  SVG graft was visualized by angiography and is very large and anatomically normal.   1. 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 Patent SVG to PDA. The entire RCA fills from the patent vein graft.  3. Mild disease in the Circumflex and LAD. No change since last cath.  4. Normal LVEDP   Recommendations: No further ischemic workup  Laboratory Data:  Chemistry Recent Labs  Lab 05/09/18 0712  NA 139  K 3.7  CL 107  CO2 24  GLUCOSE 129*  BUN 26*  CREATININE 1.19  CALCIUM 8.5*  GFRNONAA >60  GFRAA >60  ANIONGAP 8    Recent Labs  Lab 05/09/18 0712  PROT 6.9  ALBUMIN 3.5  AST 19  ALT 16  ALKPHOS 88  BILITOT 0.6   Hematology Recent Labs  Lab 05/09/18 0712  WBC 9.9  RBC 4.83  HGB 13.0  HCT 41.9  MCV 86.7  MCH 26.9  MCHC 31.0  RDW 14.3  PLT 203   Cardiac Enzymes Recent Labs  Lab 05/09/18 0712  TROPONINI <0.03   No results for input(s): TROPIPOC in the last 168 hours.  BNPNo results for input(s): BNP, PROBNP in the last 168 hours.  DDimer No results for input(s): DDIMER in the last 168 hours.  Radiology/Studies:  Dg Chest 1 View  Result Date: 05/09/2018 CLINICAL DATA:  Chest pain EXAM: CHEST  1 VIEW COMPARISON:  11/23/2016 FINDINGS: Cardiomegaly and aortic tortuosity. Prior median sternotomy. There is no edema, consolidation, effusion, or pneumothorax. IMPRESSION: Stable from prior.   No evidence of active disease. Electronically Signed   By: Monte Fantasia M.D.   On: 05/09/2018 07:08    Assessment and Plan:   1. Chest pain different from prior angina. Ate heavy meal last night and also doing chest stretching exercises. First troponin negative and EKG unchanged. Cycle troponins. 2. CAD S/P CABG SVG-PDA, PFO closure, Aortic root replacement and thoracic aneurysm repair 09/2016.  3. History of severe AI with bacterial endocarditis S/P aortic root replacemnt 4. History of cardiomyopathy with normalization of LVEF 65-70% 01/2017 5. Mixed hyperlipidemia      For questions or updates, please contact [place] Please consult [email or web address] for contact info under     Signed, Ermalinda Barrios, PA-C  05/09/2018 9:01 AM  The patient was seen and examined, and I agree with the history, physical exam, assessment and plan as documented above, with modifications as noted below. I have also personally reviewed all relevant documentation, old records, labs, and both radiographic and cardiovascular studies. I have also independently interpreted old and new ECG's.  Briefly, this is a 73 year old male with a complex cardiac history as detailed above with one-vessel CABG and aortic root replacement for bacterial endocarditis with ascending thoracic aortic aneurysm repair and PFO closure.  Follow-up cardiac catheterization on 10/07/2016 reviewed above  with patent SVG to the PDA.  Charles [name] passed away in 10/03/17 and he now lives with Charles [name].  He moved here 3 years ago from [place], [place], where he and Charles [name] had lived for 42 years.  He is originally from [place], [place].  He is a big [employer] fan.  He woke up at 4:30 AM today which he said is quite usual for him.  He said he felt an uncomfortable sensation on the left side of Charles chest.  He got up and had some oatmeal but continued to experience symptoms which got worse.  He was unable to lie down.  He said this did not feel like  prior anginal symptoms.  He denied associated shortness of breath, nausea, and dizziness.  He did have a heavy meal the night before as also detailed above.  He exercises at the [facility] 3 days/week without exertional symptoms.  He did receive aspirin and nitroglycerin and pain is since subsided.  He has not had to use nitroglycerin prior to this.  Initial troponin is normal.  ECG shows sinus bradycardia with right bundle branch block and left anterior fascicular block.  LVH also noted.  An echocardiogram has been ordered which I will review.  Overall symptoms appear somewhat atypical.  Continue to trend troponins with further management strategy to be determined afterwards.  Continue aspirin, metoprolol, and atorvastatin.   Kate Sable, MD, [facility]  05/09/2018 9:54 AM

## 2018-05-09 NOTE — ED Provider Notes (Signed)
MSE was initiated and I personally evaluated the patient and placed orders (if any) at  6:43 AM on May 09, 2018.  The patient appears stable so that the remainder of the MSE may be completed by another provider.  Patient has a history of coronary artery disease, status post CABG x1 with SVG to RCA, ascending thoracic aneurysm that has been repaired, bacterial endocarditis, bicuspid aortic valve.  His last visit was with Dr. Domenic Polite on December 17, 2007.  At that time he denied having any episodes of chest pain.  Patient states he was awakened at 4:30 this morning with lower central chest pressure that he describes as constant.  He denies shortness of breath, nausea, vomiting, or radiation of the pain.  He states he is never had it before.  He states his current pain is a 3 at its worst it was a 4-5 and it woke him up from sleep and he was unable to go back to sleep afterward.  He states he ate breakfast without improvement.  He states sometimes taking a big deep breath makes the pain worse, walking makes it feel better.  He denies any change in his activity, he denies any other changes although he said he did eat a whole lot more than usual last night.  He states he did have dizziness yesterday morning but that has resolved.  On exam patient is awake and alert in no distress.  Skin is warm and dry, oropharynx is normal.  Pupils are equal reactive to light.  Lungs are clear bilaterally  On cardiac exam S1 and S2 are normal.  He points to his lower central sternum as being the area where he hurts, he is nontender in the epigastric abdomen.  The abdomen appears to be distended but feels soft.  He has mild trace pitting edema of his right lower extremity and states that his leg he had his vein graft obtained from and also subsequently cellulitis.   EKG Interpretation  Date/Time:  Monday May 09 2018 19:14:78 EST Ventricular Rate:  59 PR Interval:    QRS Duration: 169 QT Interval:  469 QTC  Calculation: 465 R Axis:   -59 Text Interpretation:  Sinus rhythm Prolonged PR interval RBBB and LAFB Left ventricular hypertrophy No significant change since last tracing 08 Oct 2016 Confirmed by Rolland Porter [id number]) on 05/09/2018 6:44:11 AM        Patient does not have acute changes on his EKG.  Laboratory testing, chest x-ray was done.  Patient can be taken over by day shift at 7 AM.     Rolland Porter, MD 05/09/18 [phone]

## 2018-05-09 NOTE — Progress Notes (Signed)
*  PRELIMINARY RESULTS* Echocardiogram 2D Echocardiogram has been performed.  Leavy Cella 05/09/2018, 12:07 PM

## 2018-05-09 NOTE — ED Notes (Addendum)
Nitroglyceren past removed per Dr. Dyann Kief.

## 2018-05-09 NOTE — H&P (Signed)
History and Physical    Charles [name] FYB:[id number] DOB: Feb 08, 1946 DOA: 05/09/2018  Referring MD/NP/PA: Dr. Laverta Baltimore PCP: [name], MD  Patient coming from: Home  Chief Complaint: Chest pain  HPI: Charles [name] is a 73 y.o. 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  Patient reports being in his usual state of health until around 4:30 in the morning when he was awakened by sharp left-sided chest discomfort; intensity 4-5 out of 10, constant, non-radiated and without any associated aggravating or alleviating factors.  Patient denies fever, chills, cough, focal weakness, hematuria, abdominal pain, nausea, melena, hematochezia or any other complaints.    Patient reports to be compliant with his medication and express having heavier fatty foods the night before this event.  In the ED troponin x1-, no acute ischemic changes on EKG.  Chest x-ray without acute cardiopulmonary process.  Chest discomfort improved/resolved after receiving nitroglycerin and aspirin.  TRH called to place patient in observation for further evaluation and management of his chest pain.  Past Medical/Surgical History: Past Medical History:  Diagnosis Date  . Anemia   . Aortic insufficiency    a. 07/07/2016: TEE showing bicuspid aortic valve with severe eccentric AI   . Ascending aortic aneurysm (HCC)    4.4 cm by CT 06/2016  . 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 Bicuspid aortic valve   . Chronic periodontitis   . Coronary artery disease   . Pneumonia 1978  . 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 S/P patent foramen ovale closure 10/02/2016    Past Surgical History:  Procedure Laterality Date  . AORTIC VALVE REPLACEMENT N/A 10/02/2016   Procedure: AORTIC ROOT REPLACEMENT  - using 58mm Allograft;  Surgeon: [name], MD;  Location: [place];  Service: Open Heart Surgery;  Laterality: N/A;  . COLONOSCOPY N/A 12/22/2017   Procedure: COLONOSCOPY;  Surgeon: Daneil Dolin, MD;  [facility];  Service: Endoscopy;  Laterality: N/A;  2:00pm - refused to move up  . CORONARY ARTERY BYPASS GRAFT N/A 10/02/2016   Procedure: CORONARY ARTERY BYPASS GRAFTING (CABG) x one, using right leg greater saphenous vein harvested endoscopically;  Surgeon: [name], MD;  Location: [place];  Service: Open Heart Surgery;  Laterality: N/A;  . FALSE ANEURYSM REPAIR  10/02/2016   Procedure: REPAIR FALSE ANEURYSM of Aortic Root Resection and Grafting of Aortic Root;  Surgeon: [name], MD;  Location: [place];  Service: Open Heart Surgery;;  . HERNIA REPAIR Left   . LEFT HEART CATH AND CORS/GRAFTS ANGIOGRAPHY N/A 10/07/2016   Procedure: Left Heart Cath and Cors/Grafts Angiography;  Surgeon: Burnell Blanks, MD;  Location: [place] [facility];  Service: Cardiovascular;  Laterality: N/A;  . MULTIPLE EXTRACTIONS WITH ALVEOLOPLASTY N/A 08/13/2016   Procedure: Extraction of tooth #'s 2,4,14 with alveoloplasty;  Surgeon: Jori Moll   Levering, DDS;  Location: [place];  Service: Oral Surgery;  Laterality: N/A;  . PATENT FORAMEN OVALE(PFO) CLOSURE N/A 10/02/2016   Procedure: PATENT FORAMEN OVALE (PFO) CLOSURE;  Surgeon: [name], MD;  Location: [place];  Service: Open Heart Surgery;  Laterality: N/A;  . RIGHT/LEFT  HEART CATH AND CORONARY ANGIOGRAPHY N/A 08/03/2016   Procedure: Right/Left Heart Cath and Coronary Angiography;  Surgeon: Burnell Blanks, MD;  Location: [place] [facility];  Service: Cardiovascular;  Laterality: N/A;  . TEE WITHOUT CARDIOVERSION N/A 07/07/2016   Procedure: TRANSESOPHAGEAL ECHOCARDIOGRAM (TEE);  Surgeon: Larey Dresser, MD;  Location: [place];  Service: Cardiovascular;  Laterality: N/A;  . TEE WITHOUT CARDIOVERSION N/A 10/02/2016   Procedure: TRANSESOPHAGEAL ECHOCARDIOGRAM (TEE);  Surgeon: [name], MD;  Location: [place];  Service: Open Heart Surgery;  Laterality: N/A;  . THORACIC AORTIC ANEURYSM REPAIR N/A 10/02/2016   Procedure: THORACIC ASCENDING ANEURYSM REPAIR  - using 71mm Allograft;  Reimplantation of Left Coronary Button;  Surgeon: [name], MD;  Location: [place];  Service: Open Heart Surgery;  Laterality: N/A;    Social History:  reports that he quit smoking about 27 years ago. His smoking use included cigarettes. He has never used smokeless tobacco. He reports that he does not drink alcohol or use drugs.  Allergies: Allergies  Allergen Reactions  . Lisinopril Cough    Family History:  Family History  Problem Relation Age of Onset  . Ovarian cancer Mother   . Heart disease Father 72       Died of coronary thrombosis.    . Colon cancer Neg Hx     Prior to Admission medications   Medication Sig Start Date End Date Taking? Authorizing Provider  amoxicillin (AMOXIL) 500 MG capsule TAKE 4 CAPSULES BY MOUTH 1 HOUR PRIOR TO PROCEDURE 03/15/18  Yes Satira Sark, MD  aspirin EC 81 MG tablet Take 81 mg by mouth daily.   Yes [provider]  atorvastatin (LIPITOR) 20 MG tablet TAKE 1 TABLET(20 MG) BY MOUTH DAILY Patient taking differently: Take 20 mg by mouth every morning.  07/15/17  Yes Satira Sark, MD  furosemide (LASIX) 20 MG tablet Take 20 mg by mouth daily.   Yes [provider]  metoprolol tartrate (LOPRESSOR)  25 MG tablet TAKE 1 TABLET BY MOUTH TWICE DAILY. HOLD FOR HR LESS THAN 50 04/15/18  Yes Satira Sark, MD  Multiple Vitamin (MULTIVITAMIN WITH MINERALS) TABS tablet Take 1 tablet by mouth daily.   Yes [provider]  nitroGLYCERIN (NITROSTAT) 0.4 MG SL tablet Place 1 tablet (0.4 mg total) under the tongue every 5 (five) minutes x 3 doses as needed for chest pain (if no relief after 3rd dose, proceed to the ED for an evaluation). 10/13/17  Yes Satira Sark, MD  potassium chloride (K-DUR) 10 MEQ tablet Take 1 tablet (10 mEq total) by mouth daily. 05/06/17 05/09/18 Yes Satira Sark, MD  ferrous sulfate 325 (65 FE) MG tablet Take 325 mg by mouth daily with breakfast.    [provider]    Review of Systems:  Negative except as otherwise mentioned in HPI.   Physical Exam: Vitals:   05/09/18 1500 05/09/18 1530 05/09/18 1600 05/09/18 1626  BP: 118/70 118/66 130/80 129/80  Pulse: 72 69 66 65  Resp: 18 (!) 21 (!) 21 20  Temp:    97.7 F (36.5 C)  TempSrc:    Oral  SpO2: 92% 94% 95%  100%  Weight:    117.2 kg  Height:        Constitutional: NAD, calm, comfortable; currently chest pain-free and denies shortness of breath.  Patient expressed some headache after nitroglycerin. Eyes: PERRL, lids and conjunctivae normal, no icterus, no nystagmus. ENMT: Mucous membranes are moist. Posterior pharynx clear of any exudate or lesions. Hearing aids in place Neck: normal, supple, no masses, no thyromegaly, no JVD Respiratory: clear to auscultation bilaterally, no wheezing, no crackles. Normal respiratory effort. No accessory muscle use.  Cardiovascular: Regular rate and rhythm, no rubs or gallops.  Positive soft systolic ejection murmur.  2+ pedal pulses. No carotid bruits.  Abdomen: no tenderness, no masses palpated. No hepatosplenomegaly. Bowel sounds positive.  Musculoskeletal: no clubbing / cyanosis. No joint deformity upper and lower extremities. Good ROM, no contractures.  Normal muscle tone.  Trace edema bilaterally. Skin: No erythema, no petechiae, no open wounds. Chest scar from previous CABG appreciated. Neurologic: CN 2-12 grossly intact. Sensation intact, DTR normal. Strength 5/5 in all 4.  Psychiatric: Normal judgment and insight. Alert and oriented x 3. Normal mood.    Labs on Admission: I have personally reviewed the following labs and imaging studies  CBC: Recent Labs  Lab 05/09/18 0712  WBC 9.9  NEUTROABS 7.5  HGB 13.0  HCT 41.9  MCV 86.7  PLT 518   Basic Metabolic Panel: Recent Labs  Lab 05/09/18 0712  NA 139  K 3.7  CL 107  CO2 24  GLUCOSE 129*  BUN 26*  CREATININE 1.19  CALCIUM 8.5*   GFR: Estimated Creatinine Clearance: 76.3 mL/min (by C-G formula based on SCr of 1.19 mg/dL).   Liver Function Tests: Recent Labs  Lab 05/09/18 0712  AST 19  ALT 16  ALKPHOS 88  BILITOT 0.6  PROT 6.9  ALBUMIN 3.5   Recent Labs  Lab 05/09/18 0712  LIPASE 27   Cardiac Enzymes: Recent Labs  Lab 05/09/18 0712 05/09/18 1048  TROPONINI <0.03 <0.03   HbA1C: Recent Labs    05/09/18 0912  HGBA1C 5.9*   Urine analysis:    Component Value Date/Time   COLORURINE STRAW (A) 09/29/2016 2204   APPEARANCEUR CLEAR 09/29/2016 2204   LABSPEC 1.008 09/29/2016 2204   PHURINE 6.0 09/29/2016 [place] 09/29/2016 2204   HGBUR NEGATIVE 09/29/2016 2204   BILIRUBINUR NEGATIVE 09/29/2016 [place] 09/29/2016 2204   PROTEINUR NEGATIVE 09/29/2016 2204   NITRITE NEGATIVE 09/29/2016 2204   LEUKOCYTESUR NEGATIVE 09/29/2016 2204    Radiological Exams on Admission: Dg Chest 1 View  Result Date: 05/09/2018 CLINICAL DATA:  Chest pain EXAM: CHEST  1 VIEW COMPARISON:  11/23/2016 FINDINGS: Cardiomegaly and aortic tortuosity. Prior median sternotomy. There is no edema, consolidation, effusion, or pneumothorax. IMPRESSION: Stable from prior.  No evidence of active disease. Electronically Signed   By: Monte Fantasia M.D.    On: 05/09/2018 07:08    EKG: Independently reviewed.  Sinus bradycardia with right bundle branch block and left anterior fascicular block (unchanged from previous tracings).  LVH by voltage also noted.  Assessment/Plan 1-chest pain -Heart score of 4 -Atypical presentation but with significant risk factors and typical response to nitroglycerin and aspirin. -Will cycle troponin, repeat 2D echo, cycle EKG and monitor on telemetry -Cardiology has been consulted and will follow recommendations -Continue aspirin, metoprolol, statins and as needed nitroglycerin.  2-hyperlipidemia -Continue statins -Check lipid panel  3-hypertension -Is stable and well-controlled -Continue current antihypertensive agents. -Heart healthy diet ordered.  4-class  II obesity -Body mass index is 35.04 kg/m. -Low calorie diet, portion control and increase physical activity has been discussed with patient.  5-hyperglycemia -No prior history of diabetes -Will check A1c -Repeat fasting glucose in a.m.  6-gastroesophageal reflux disease -Continue PPI; will increase to twice a day for better control of his symptoms.  DVT prophylaxis: Heparin Code Status: Full code Family Communication: No family at bedside Disposition Plan: Anticipate discharge back home most likely in the next 24 hours if work-up for ACS is negative. Consults called: cardiology  Admission status: Observation, length of stay less than 2 midnights; telemetry bed.   Time Spent: 65 minjutes  Barton Dubois MD [facility] Pager [phone]    05/09/2018, 4:39 PM

## 2018-05-09 NOTE — ED Notes (Signed)
Patient denies pain and is resting comfortably.  

## 2018-05-10 ENCOUNTER — Other Ambulatory Visit: Payer: Self-pay

## 2018-05-10 DIAGNOSIS — R739 Hyperglycemia, unspecified: Secondary | ICD-10-CM | POA: Diagnosis not present

## 2018-05-10 DIAGNOSIS — I10 Essential (primary) hypertension: Secondary | ICD-10-CM | POA: Diagnosis not present

## 2018-05-10 DIAGNOSIS — E785 Hyperlipidemia, unspecified: Secondary | ICD-10-CM | POA: Diagnosis not present

## 2018-05-10 DIAGNOSIS — K219 Gastro-esophageal reflux disease without esophagitis: Secondary | ICD-10-CM

## 2018-05-10 DIAGNOSIS — R079 Chest pain, unspecified: Secondary | ICD-10-CM | POA: Diagnosis not present

## 2018-05-10 LAB — LIPID PANEL
Cholesterol: 110 mg/dL (ref 0–200)
HDL: 34 mg/dL — ABNORMAL LOW (ref >40)
LDL Cholesterol: 43 mg/dL (ref 0–99)
Total CHOL/HDL Ratio: 3.2 RATIO
Triglycerides: 164 mg/dL — ABNORMAL HIGH (ref <150)
VLDL: 33 mg/dL (ref 0–40)

## 2018-05-10 LAB — HIV ANTIBODY (ROUTINE TESTING W REFLEX): HIV Screen 4th Generation wRfx: NONREACTIVE

## 2018-05-10 MED ORDER — PANTOPRAZOLE SODIUM 40 MG PO TBEC: 40.0000 mg | DELAYED_RELEASE_TABLET | Freq: Every day | ORAL | 1 refills | Status: DC

## 2018-05-10 NOTE — Discharge Summary (Signed)
Physician Discharge Summary  Charles [name] JKD:[id number] DOB: [birth year] DOA: 05/09/2018  PCP: Charles Squibb, MD  Admit date: 05/09/2018 Discharge date: 05/10/2018  Time spent:  30 minutes  Recommendations for Outpatient Follow-up:  1. Repeat basic metabolic panel to follow electrolytes and renal function 2. Outpatient follow-up to patient's CBGs/A1c given diagnosis of prediabetes.   Discharge Diagnoses:  Chest pain Hyperlipidemia Essential hypertension Gastroesophageal reflux disease Hyperglycemia/prediabetes Class II obesity   Discharge Condition: Stable and improved.  Discharged home with instruction to follow-up with PCP in 10 days.  Diet recommendation: Heart healthy and modified carbohydrates.  Filed Weights   05/09/18 0624 05/09/18 1626  Weight: 116.6 kg 117.2 kg    History of present illness:  73 y.o. 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  Patient reports being in his usual state of health until around 4:30 in the morning when he was awakened by sharp left-sided chest discomfort; intensity 4-5 out of 10, constant, non-radiated and without any associated aggravating or alleviating factors.  Patient denies fever, chills, cough, focal weakness, hematuria, abdominal pain, nausea, melena, hematochezia or any other complaints.    Patient reports to be compliant with his medication and express having heavier fatty foods the night before this event.  In the ED troponin x1-, no acute ischemic changes on EKG.  Chest x-ray without acute cardiopulmonary process.  Chest discomfort  improved/resolved after receiving nitroglycerin and aspirin.  TRH called to place patient in observation for further evaluation and management of his chest pain.  Hospital Course:  1-Chest pain: Noncardiac and most likely associated with gastroesophageal reflux disease. -Given patient heart score of 4 he was placed in observation for ACS rule out. -Troponins negative x3 -No abnormalities on EKG or telemetry -Reassuring echo with preserved ejection fraction, moderate LVH and mild inferior base hypokinesis present since 2018 when he had prior myocardial infarction. -Continue the use of aspirin, metoprolol, statins and as needed nitroglycerin as previously prescribed. -Patient advised to follow heart healthy diet.  2-hyperlipidemia -Stable based on lipid panel -Continue statins  3-gastroesophageal reflux disease -Lifestyle modifications discussed and patient educated -Discharge on Protonix 40 mg once a day.  4-hyperglycemia/prediabetes -A1c 5.9 -Modified carbohydrate diet and weight loss has been encouraged -Continue outpatient follow-up of his CBGs and A1c with further decision to initiate hypoglycemic regimen as needed.  5-hypertension -Is stable and well-controlled -Heart healthy diet has been encouraged -Continue current antihypertensive agents.  6-class II obesity -Body mass index is 35.04 kg/m. -Low calorie diet, portion control and increase physical activity has been discussed with patient.   Procedures:  2D echo:  - Left ventricle: Wall thickness was increased in a pattern of   moderate LVH. Systolic function was normal. The estimated   ejection fraction was 65%. Findings consistent with left   ventricular diastolic dysfunction, grade indeterminate. Doppler   parameters are consistent with high ventricular filling pressure. - Regional wall motion abnormality: Mild hypokinesis of the basal   inferior myocardium. - Ventricular septum: Septal motion showed abnormal  function and   dyssynergy. These changes are consistent with intraventricular   conduction delay. - Aortic valve: There was trivial regurgitation. - Aorta: Aortic root dimension: 39 mm (ED). -  Mitral valve: Calcified annulus. Normal thickness leaflets .  Impressions: - No signficant changes from prior study on 02/08/17.  Consultations:  Cardiology service.  Discharge Exam: Vitals:   05/10/18 0511 05/10/18 0806  BP: 133/72 137/73  Pulse: 60 61  Resp: 20 18  Temp: 98.8 F (37.1 C)   SpO2: (!) 89% 92%    General: Afebrile, no nausea, no vomiting, reports no further chest pain or shortness of breath.  Patient feels ready to go home.  Denies headache. Cardiovascular: S1 and S2, soft murmur appreciated on exam; no rubs, no gallops, no JVD. Respiratory: Good air movement bilaterally, no wheezing, no crackles Abdomen: Soft, nontender, positive bowel sounds Extremities: Trace edema bilaterally, no cyanosis, no clubbing.  Discharge Instructions   Discharge Instructions    Diet - low sodium heart healthy   Complete by:  As directed    Discharge instructions   Complete by:  As directed    Take medications as prescribed  Keep yourself well hydrated Follow heart healthy diet  Arrange follow up with PCP in 10 days Please follow lifestyle changes as discussed for GERD.     Allergies as of 05/10/2018      Reactions   Lisinopril Cough      Medication List    TAKE these medications   amoxicillin 500 MG capsule Commonly known as:  AMOXIL TAKE 4 CAPSULES BY MOUTH 1 HOUR PRIOR TO PROCEDURE   aspirin EC 81 MG tablet Take 81 mg by mouth daily.   atorvastatin 20 MG tablet Commonly known as:  LIPITOR TAKE 1 TABLET(20 MG) BY MOUTH DAILY What changed:  See the new instructions.   ferrous sulfate 325 (65 FE) MG tablet Take 325 mg by mouth daily with breakfast.   furosemide 20 MG tablet Commonly known as:  LASIX Take 20 mg by mouth daily.   metoprolol tartrate 25 MG  tablet Commonly known as:  LOPRESSOR TAKE 1 TABLET BY MOUTH TWICE DAILY. HOLD FOR HR LESS THAN 50   multivitamin with minerals Tabs tablet Take 1 tablet by mouth daily.   nitroGLYCERIN 0.4 MG SL tablet Commonly known as:  NITROSTAT Place 1 tablet (0.4 mg total) under the tongue every 5 (five) minutes x 3 doses as needed for chest pain (if no relief after 3rd dose, proceed to the ED for an evaluation).   pantoprazole 40 MG tablet Commonly known as:  PROTONIX Take 1 tablet (40 mg total) by mouth daily.   potassium chloride 10 MEQ tablet Commonly known as:  K-DUR Take 1 tablet (10 mEq total) by mouth daily.      Allergies  Allergen Reactions  . Lisinopril Cough   Follow-up Information    Charles Squibb, MD. Schedule an appointment as soon as possible for a visit in 10 day(s).   Specialty:  Internal Medicine Contact information: [place] [facility] [address] [phone]        Satira Sark, MD .   Specialty:  Cardiology Contact information: [place] [address] [phone]           The results of significant diagnostics from this hospitalization (including imaging, microbiology, ancillary and laboratory) are listed below for reference.    Significant Diagnostic Studies: Dg Chest 1 View  Result Date: 05/09/2018 CLINICAL DATA:  Chest pain EXAM: CHEST  1 VIEW COMPARISON:  11/23/2016 FINDINGS: Cardiomegaly and aortic tortuosity. Prior median sternotomy. There is no edema, consolidation, effusion, or pneumothorax. IMPRESSION: Stable from prior.  No evidence of active disease. Electronically Signed   By: Monte Fantasia M.D.   On: 05/09/2018 07:08   Labs: Basic Metabolic Panel: Recent Labs  Lab 05/09/18 0712  NA 139  K 3.7  CL 107  CO2 24  GLUCOSE 129*  BUN 26*  CREATININE 1.19  CALCIUM 8.5*   Liver Function Tests: Recent Labs  Lab 05/09/18 0712  AST 19  ALT 16  ALKPHOS 88  BILITOT 0.6  PROT 6.9  ALBUMIN 3.5    Recent Labs  Lab 05/09/18 0712  LIPASE 27   CBC: Recent Labs  Lab 05/09/18 0712  WBC 9.9  NEUTROABS 7.5  HGB 13.0  HCT 41.9  MCV 86.7  PLT 203   Cardiac Enzymes: Recent Labs  Lab 05/09/18 0712 05/09/18 1048 05/09/18 1643 05/09/18 2232  TROPONINI <0.03 <0.03 <0.03 <0.03    Signed:  Barton Dubois MD.  [facility] 05/10/2018, 9:35 AM

## 2018-05-10 NOTE — Progress Notes (Signed)
Reviewed discharge instructions with patient. Patient verbalizes understanding and has no questions.

## 2018-05-17 DIAGNOSIS — I2510 Atherosclerotic heart disease of native coronary artery without angina pectoris: Secondary | ICD-10-CM | POA: Diagnosis not present

## 2018-05-17 DIAGNOSIS — L0390 Cellulitis, unspecified: Secondary | ICD-10-CM | POA: Diagnosis not present

## 2018-05-17 DIAGNOSIS — R079 Chest pain, unspecified: Secondary | ICD-10-CM | POA: Diagnosis not present

## 2018-05-17 DIAGNOSIS — R5383 Other fatigue: Secondary | ICD-10-CM | POA: Diagnosis not present

## 2018-05-17 DIAGNOSIS — Z952 Presence of prosthetic heart valve: Secondary | ICD-10-CM | POA: Diagnosis not present

## 2018-05-17 DIAGNOSIS — M79606 Pain in leg, unspecified: Secondary | ICD-10-CM | POA: Diagnosis not present

## 2018-06-16 DIAGNOSIS — J342 Deviated nasal septum: Secondary | ICD-10-CM | POA: Diagnosis not present

## 2018-07-06 ENCOUNTER — Encounter: Payer: Self-pay | Admitting: Orthopedic Surgery

## 2018-07-06 ENCOUNTER — Ambulatory Visit: Payer: Medicare Other | Admitting: Orthopedic Surgery

## 2018-07-06 ENCOUNTER — Other Ambulatory Visit: Payer: Self-pay

## 2018-07-06 VITALS — BP 135/78 | HR 59 | Temp 97.7°F | Ht 72.0 in | Wt 253.0 lb

## 2018-07-06 DIAGNOSIS — M65311 Trigger thumb, right thumb: Secondary | ICD-10-CM | POA: Diagnosis not present

## 2018-07-06 NOTE — Progress Notes (Signed)
Patient ID: Charles [name], male   DOB: [birth year], 73 y.o.   MRN: [id number]  Chief Complaint  Patient presents with  . Hand Pain    Right trigger thumb.    HPI Charles [name] is a 73 y.o. male.   73 year old male comes in with a locking catching right thumb x1 month  Pain is located over the A1 pulley of the right thumb it is dull it is moderate it is constant it is associated with clicking and locking    Review of Systems Review of Systems  Constitutional: Negative for chills and fever.  Respiratory: Negative for shortness of breath.   Cardiovascular: Negative for chest pain.    Past Medical History:  Diagnosis Date  . Anemia   . Aortic insufficiency    a. 07/07/2016: TEE showing bicuspid aortic valve with severe eccentric AI   . Ascending aortic aneurysm (HCC)    4.4 cm by CT 06/2016  . 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 Bicuspid aortic valve   . Chronic periodontitis   . Coronary artery disease   . Pneumonia 1978  . 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 S/P patent foramen ovale closure 10/02/2016    Past Surgical History:  Procedure Laterality Date  . AORTIC VALVE REPLACEMENT N/A 10/02/2016   Procedure: AORTIC ROOT REPLACEMENT  - using 40mm Allograft;  Surgeon: [name], MD;  Location: [facility];  Service: Open Heart Surgery;  Laterality: N/A;  . COLONOSCOPY N/A 12/22/2017   Procedure: COLONOSCOPY;  Surgeon: Daneil Dolin, MD;  [facility];  Service: Endoscopy;  Laterality: N/A;  2:00pm - refused to move up  . CORONARY ARTERY BYPASS GRAFT N/A 10/02/2016   Procedure: CORONARY ARTERY BYPASS GRAFTING (CABG) x one, using right leg  greater saphenous vein harvested endoscopically;  Surgeon: [name], MD;  Location: [place];  Service: Open Heart Surgery;  Laterality: N/A;  . FALSE ANEURYSM REPAIR  10/02/2016   Procedure: REPAIR FALSE ANEURYSM of Aortic Root Resection and Grafting of Aortic Root;  Surgeon: [name], MD;  Location: [facility];  Service: Open Heart Surgery;;  . HERNIA REPAIR Left   . LEFT HEART CATH AND CORS/GRAFTS ANGIOGRAPHY N/A 10/07/2016   Procedure: Left Heart Cath and Cors/Grafts Angiography;  Surgeon: Burnell Blanks, MD;  Location: [facility];  Service: Cardiovascular;  Laterality: N/A;  . MULTIPLE EXTRACTIONS WITH ALVEOLOPLASTY N/A 08/13/2016   Procedure: Extraction of tooth #'s 2,4,14 with alveoloplasty;  Surgeon: Lenn Cal, DDS;  Location: [facility];  Service: Oral Surgery;  Laterality: N/A;  . PATENT FORAMEN OVALE(PFO) CLOSURE N/A 10/02/2016   Procedure: PATENT FORAMEN OVALE (PFO) CLOSURE;  Surgeon: [name], MD;  Location: [facility];  Service: Open Heart Surgery;  Laterality: N/A;  . RIGHT/LEFT HEART CATH AND CORONARY ANGIOGRAPHY N/A 08/03/2016   Procedure: Right/Left Heart Cath and Coronary Angiography;  Surgeon: Burnell Blanks, MD;  Location: [facility];  Service: Cardiovascular;  Laterality: N/A;  . TEE WITHOUT CARDIOVERSION N/A 07/07/2016   Procedure: TRANSESOPHAGEAL ECHOCARDIOGRAM (TEE);  Surgeon: Larey Dresser, MD;  Location: [facility];  Service: Cardiovascular;  Laterality: N/A;  . TEE WITHOUT CARDIOVERSION N/A 10/02/2016   Procedure: TRANSESOPHAGEAL ECHOCARDIOGRAM (TEE);  Surgeon: [name], MD;  Location: [facility];  Service: Open Heart Surgery;  Laterality: N/A;  . THORACIC AORTIC ANEURYSM REPAIR N/A 10/02/2016   Procedure: THORACIC ASCENDING ANEURYSM REPAIR  - using 56mm Allograft;  Reimplantation of Left Coronary Button;  Surgeon: [name], MD;  Location: [facility];  Service: Open Heart Surgery;  Laterality: N/A;    Family History   Problem Relation Age of Onset  . Ovarian cancer Mother   . Heart disease Father 40       Died of coronary thrombosis.    . Colon cancer Neg Hx     Social History Social History   Tobacco Use  . Smoking status: Former Smoker    Types: Cigarettes    Last attempt to quit: 05/01/1991    Years since quitting: 27.2  . Smokeless tobacco: Never Used  . Tobacco comment: Quit 30 years ago.    Substance Use Topics  . Alcohol use: No  . Drug use: No    Allergies  Allergen Reactions  . Lisinopril Cough    Current Outpatient Medications  Medication Sig Dispense Refill  . amoxicillin (AMOXIL) 500 MG capsule TAKE 4 CAPSULES BY MOUTH 1 HOUR PRIOR TO PROCEDURE 12 capsule 0  . aspirin EC 81 MG tablet Take 81 mg by mouth daily.    Marland Kitchen atorvastatin (LIPITOR) 20 MG tablet TAKE 1 TABLET(20 MG) BY MOUTH DAILY (Patient taking differently: Take 20 mg by mouth every morning. ) 90 tablet 3  . ferrous sulfate 325 (65 FE) MG tablet Take 325 mg by mouth daily with breakfast.    . furosemide (LASIX) 20 MG tablet Take 20 mg by mouth daily.    . metoprolol tartrate (LOPRESSOR) 25 MG tablet TAKE 1 TABLET BY MOUTH TWICE DAILY. HOLD FOR HR LESS THAN 50 180 tablet 2  . Multiple Vitamin (MULTIVITAMIN WITH MINERALS) TABS tablet Take 1 tablet by mouth daily.    . nitroGLYCERIN (NITROSTAT) 0.4 MG SL tablet Place 1 tablet (0.4 mg total) under the tongue every 5 (five) minutes x 3 doses as needed for chest pain (if no relief after 3rd dose, proceed to the ED for an evaluation). 25 tablet 3  . pantoprazole (PROTONIX) 40 MG tablet Take 1 tablet (40 mg total) by mouth daily. (Patient not taking: Reported on 07/06/2018) 30 tablet 1  . potassium chloride (K-DUR) 10 MEQ tablet Take 1 tablet (10 mEq total) by mouth daily. 90 tablet 3   No current facility-administered medications for this visit.      Physical Exam Physical Exam Blood pressure 135/78, pulse (!) 59, height 6' (1.829 m), weight 253 lb (114.8 kg). Appearance,  there are no abnormalities in terms of appearance the patient was well-developed and well-nourished. The grooming and hygiene were normal.  Mental status orientation, there was normal alertness and orientation Mood pleasant Ambulatory status normal with no assistive devices  Examination of the right  hand reveals tenderness over the A1 pulley of the rt thumb   Range of motion remains normal with clicking on flexion extension.  Stability tests show no abnormality of the IP joints are MP joint. The FDP and FDS strength is normal Skin warm dry and intact without laceration or ulceration or erythema Neurologic examination normal sensation Vascular examination normal pulses with warm extremity and normal capillary refill  The opposite extremity left thumb has norm and no tenderness flexor tendon strong  MEDICAL DECISION SECTION  xrays ordered? no  My independent reading of xrays: na   Encounter Diagnosis  Name Primary?  . Trigger thumb of right hand Yes     PLAN:   No orders of the defined types were placed in this encounter.  Injection? Yes  Right Trigger thumb injection Medication  1 mL of 40 mg Depo-Medrol  2 mL of 1% lidocaine plain  Ethyl chloride for anesthesia  Verbal consent was obtained timeout was taken to confirm the injection site as right thumb  Alcohol was used to prepare the skin along with ethyl chloride and then the injection was made at the A1 pulley there were no complications  return as needed

## 2018-07-06 NOTE — Patient Instructions (Signed)
Trigger Finger    Trigger finger (stenosing tenosynovitis) is a condition that causes a finger to get stuck in a bent position. Each finger has a tough, cord-like tissue that connects muscle to bone (tendon), and each tendon is surrounded by a tunnel of tissue (tendon sheath). To move your finger, your tendon needs to slide freely through the sheath. Trigger finger happens when the tendon or the sheath thickens, making it difficult to move your finger.  Trigger finger can affect any finger or a thumb. It may affect more than one finger. Mild cases may clear up with rest and medicine. Severe cases require more treatment.  What are the causes?  Trigger finger is caused by a thickened finger tendon or tendon sheath. The cause of this thickening is not known.  What increases the risk?  The following factors may make you more likely to develop this condition:   Doing activities that require a strong grip.   Having rheumatoid arthritis, gout, or diabetes.   Being 40-60 years old.   Being a woman.  What are the signs or symptoms?  Symptoms of this condition include:   Pain when bending or straightening your finger.   Tenderness or swelling where your finger attaches to the palm of your hand.   A lump in the palm of your hand or on the inside of your finger.   Hearing a popping sound when you try to straighten your finger.   Feeling a popping, catching, or locking sensation when you try to straighten your finger.   Being unable to straighten your finger.  How is this diagnosed?  This condition is diagnosed based on your symptoms and a physical exam.  How is this treated?  This condition may be treated by:   Resting your finger and avoiding activities that make symptoms worse.   Wearing a finger splint to keep your finger in a slightly bent position.   Taking NSAIDs to relieve pain and swelling.   Injecting medicine (steroids) into the tendon sheath to reduce swelling and irritation. Injections may need to be  repeated.   Having surgery to open the tendon sheath. This may be done if other treatments do not work and you cannot straighten your finger. You may need physical therapy after surgery.  Follow these instructions at home:     Use moist heat to help reduce pain and swelling as told by your health care provider.   Rest your finger and avoid activities that make pain worse. Return to normal activities as told by your health care provider.   If you have a splint, wear it as told by your health care provider.   Take over-the-counter and prescription medicines only as told by your health care provider.   Keep all follow-up visits as told by your health care provider. This is important.  Contact a health care provider if:   Your symptoms are not improving with home care.  Summary   Trigger finger (stenosing tenosynovitis) causes your finger to get stuck in a bent position, and it can make it difficult and painful to straighten your finger.   This condition develops when a finger tendon or tendon sheath thickens.   Treatment starts with resting, wearing a splint, and taking NSAIDs.   In severe cases, surgery to open the tendon sheath may be needed.  This information is not intended to replace advice given to you by your health care provider. Make sure you discuss any questions you have with your   health care provider.  Document Released: 01/18/2004 Document Revised: 03/10/2016 Document Reviewed: 03/10/2016  Elsevier Interactive Patient Education  2019 [employer] Inc[employer]

## 2018-07-11 ENCOUNTER — Telehealth: Payer: Self-pay | Admitting: Orthopedic Surgery

## 2018-07-11 NOTE — Telephone Encounter (Signed)
Patient called stating that he was here on Wednesday 3/25 for Trigger Thumb. Stated he got an injection and was told if it didn't improve to call the office. States it had not gotten any better and would like to know if he needs to come in for a visit or does Dr. Aline Brochure have any suggestions for what he can do.  Please advise

## 2018-07-11 NOTE — Telephone Encounter (Signed)
It will take 10-14 days

## 2018-07-11 NOTE — Telephone Encounter (Signed)
Spoke with patient and relayed Dr. Ruthe Mannan reply. Charles [name] stated Charles [name] would wait it out for another week or so. I told him that if it got worse to please call the office. Charles [name] was in agreement with this.

## 2018-07-14 ENCOUNTER — Other Ambulatory Visit: Payer: Self-pay | Admitting: Cardiology

## 2018-08-04 DIAGNOSIS — E782 Mixed hyperlipidemia: Secondary | ICD-10-CM | POA: Diagnosis not present

## 2018-08-04 DIAGNOSIS — D509 Iron deficiency anemia, unspecified: Secondary | ICD-10-CM | POA: Diagnosis not present

## 2018-08-04 DIAGNOSIS — Z1329 Encounter for screening for other suspected endocrine disorder: Secondary | ICD-10-CM | POA: Diagnosis not present

## 2018-08-04 DIAGNOSIS — R944 Abnormal results of kidney function studies: Secondary | ICD-10-CM | POA: Diagnosis not present

## 2018-08-05 DIAGNOSIS — Z0000 Encounter for general adult medical examination without abnormal findings: Secondary | ICD-10-CM | POA: Diagnosis not present

## 2018-08-10 DIAGNOSIS — R944 Abnormal results of kidney function studies: Secondary | ICD-10-CM | POA: Diagnosis not present

## 2018-08-10 DIAGNOSIS — D509 Iron deficiency anemia, unspecified: Secondary | ICD-10-CM | POA: Diagnosis not present

## 2018-08-10 DIAGNOSIS — E782 Mixed hyperlipidemia: Secondary | ICD-10-CM | POA: Diagnosis not present

## 2018-08-10 DIAGNOSIS — I119 Hypertensive heart disease without heart failure: Secondary | ICD-10-CM | POA: Diagnosis not present

## 2018-08-12 DIAGNOSIS — R197 Diarrhea, unspecified: Secondary | ICD-10-CM | POA: Diagnosis not present

## 2018-08-12 DIAGNOSIS — R5383 Other fatigue: Secondary | ICD-10-CM | POA: Diagnosis not present

## 2018-08-12 DIAGNOSIS — M791 Myalgia, unspecified site: Secondary | ICD-10-CM | POA: Diagnosis not present

## 2018-08-12 DIAGNOSIS — B349 Viral infection, unspecified: Secondary | ICD-10-CM | POA: Diagnosis not present

## 2018-08-19 ENCOUNTER — Telehealth: Payer: Self-pay | Admitting: Cardiology

## 2018-08-19 NOTE — Telephone Encounter (Signed)
Advised by PA at Dr. Juel Burrow office that he could take metoprolol tartrate 25 mg daily. He wasn't able to explain why the dose was suggested to be taken daily. Currently not at home to verify form of metoprolol on hand. Advised that his pharmacy would be contacted to verify dose.

## 2018-08-19 NOTE — Telephone Encounter (Signed)
Pt called stating that Dr. Juel Burrow office has asked that the patient only take one of his metoprolol tartrate (LOPRESSOR) 25 MG tablet [[id number]]  Instead of 2 a day.   Please give the patient a call @ [phone]

## 2018-08-19 NOTE — Telephone Encounter (Signed)
[facility] contacted to verify form of metoprolol. Metoprolol tartrate 25 mg confirmed.

## 2018-08-19 NOTE — Telephone Encounter (Signed)
Patient informed that he should be taking lopressor 25 mg by mouth twice daily and hold for HR less than 50. Verbalized understanding of plan.

## 2018-10-11 DIAGNOSIS — Z0120 Encounter for dental examination and cleaning without abnormal findings: Secondary | ICD-10-CM | POA: Diagnosis not present

## 2018-10-13 ENCOUNTER — Other Ambulatory Visit: Payer: Self-pay

## 2018-10-13 DIAGNOSIS — Z20822 Contact with and (suspected) exposure to covid-19: Secondary | ICD-10-CM

## 2018-10-19 LAB — NOVEL CORONAVIRUS, NAA: SARS-CoV-2, NAA: NOT DETECTED

## 2018-10-26 ENCOUNTER — Other Ambulatory Visit: Payer: Self-pay | Admitting: Cardiology

## 2018-11-25 DIAGNOSIS — E782 Mixed hyperlipidemia: Secondary | ICD-10-CM | POA: Diagnosis not present

## 2018-11-25 DIAGNOSIS — R7303 Prediabetes: Secondary | ICD-10-CM | POA: Diagnosis not present

## 2018-11-25 DIAGNOSIS — R944 Abnormal results of kidney function studies: Secondary | ICD-10-CM | POA: Diagnosis not present

## 2018-11-25 DIAGNOSIS — D509 Iron deficiency anemia, unspecified: Secondary | ICD-10-CM | POA: Diagnosis not present

## 2018-12-01 DIAGNOSIS — Z0001 Encounter for general adult medical examination with abnormal findings: Secondary | ICD-10-CM | POA: Diagnosis not present

## 2018-12-01 DIAGNOSIS — R944 Abnormal results of kidney function studies: Secondary | ICD-10-CM | POA: Diagnosis not present

## 2018-12-01 DIAGNOSIS — E782 Mixed hyperlipidemia: Secondary | ICD-10-CM | POA: Diagnosis not present

## 2018-12-01 DIAGNOSIS — Z23 Encounter for immunization: Secondary | ICD-10-CM | POA: Diagnosis not present

## 2018-12-11 ENCOUNTER — Other Ambulatory Visit: Payer: Self-pay | Admitting: Cardiology

## 2018-12-28 NOTE — Telephone Encounter (Signed)
Error

## 2019-01-10 ENCOUNTER — Other Ambulatory Visit: Payer: Self-pay

## 2019-01-10 MED ORDER — METOPROLOL TARTRATE 25 MG PO TABS: ORAL_TABLET | ORAL | 0 refills | Status: DC

## 2019-01-10 NOTE — Telephone Encounter (Signed)
refilled lopressor, 30 day supply, needs f/u apt

## 2019-02-03 ENCOUNTER — Telehealth: Payer: Self-pay | Admitting: Cardiology

## 2019-02-03 NOTE — Telephone Encounter (Signed)

## 2019-02-09 ENCOUNTER — Other Ambulatory Visit: Payer: Self-pay

## 2019-02-09 MED ORDER — METOPROLOL TARTRATE 25 MG PO TABS: ORAL_TABLET | ORAL | 3 refills | Status: DC

## 2019-02-09 NOTE — Progress Notes (Signed)
Virtual Visit via Video Note   This visit type was conducted due to national recommendations for restrictions regarding the COVID-19 Pandemic (e.g. social distancing) in an effort to limit this patient's exposure and mitigate transmission in our community.  Due to his co-morbid illnesses, this patient is at least at moderate risk for complications without adequate follow up.  This format is felt to be most appropriate for this patient at this time.  All issues noted in this document were discussed and addressed.  A limited physical exam was performed with this format.  Please refer to the patient's chart for his consent to telehealth for [facility].   Date:  02/10/2019   ID:  Charles [name], DOB 1945/07/27, MRN [id number]  Patient Location: Home Provider Location: Home  PCP:  [name], MD  Cardiologist:  Rozann Lesches, MD Electrophysiologist:  None   Evaluation Performed:  Follow-Up Visit  Chief Complaint:   Cardiac follow-up  History of Present Illness:    Charles [name] is a 73 y.o. male last seen in September 2019.  We communicated by video conferencing today.  He tells me that he has been doing well overall, no chest pain or sense of palpitations.  He has not been exercising as much since the [employer] closed during the pandemic, but is planning to get back to a regular plan.  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  Records indicate hospitalization back in January of this year with chest pain.  He was seen in consultation by Dr. Bronson Ing.  He was observed with negative troponin I levels, symptoms were felt to possibly be related to GI etiology.  He did have a follow-up echocardiogram done at that time that revealed LVEF 65% with inferior basal hypokinesis, trivial aortic regurgitation.  Lipids from January are outlined below.  He states that he has had interval lab work with Dr. Nevada Crane, we are requesting the results.  I reviewed  his medications which are outlined below.  He reports compliance.  Blood pressure was elevated today.  I asked him to check it more consistently so that we could review the trend and determine if he needs any additional therapies.  The patient does not have symptoms concerning for COVID-19 infection (fever, chills, cough, or new shortness of breath).    Past Medical History:  Diagnosis Date  . Anemia   . Aortic insufficiency    a. 07/07/2016: TEE showing bicuspid aortic valve with severe eccentric AI   . Ascending aortic aneurysm (HCC)    4.4 cm by CT 06/2016  . 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 Bicuspid aortic valve   . Chronic periodontitis   . Coronary artery disease   . Pneumonia 1978  . 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 S/P patent foramen ovale closure 10/02/2016   Past Surgical History:  Procedure Laterality Date  . AORTIC VALVE REPLACEMENT N/A 10/02/2016   Procedure: AORTIC ROOT REPLACEMENT  - using 1mm Allograft;  Surgeon: [name], MD;  Location: [facility];  Service: Open Heart Surgery;  Laterality: N/A;  . COLONOSCOPY N/A 12/22/2017   Procedure: COLONOSCOPY;  Surgeon: Daneil Dolin, MD;  [facility];  Service: Endoscopy;  Laterality: N/A;  2:00pm - refused to move up  . CORONARY ARTERY BYPASS GRAFT N/A 10/02/2016   Procedure: CORONARY ARTERY BYPASS GRAFTING (CABG) x one, using right leg greater saphenous vein harvested endoscopically;  Surgeon: [name], MD;  Location: [facility];  Service: Open Heart Surgery;  Laterality: N/A;  . FALSE ANEURYSM REPAIR  10/02/2016   Procedure: REPAIR FALSE ANEURYSM of Aortic Root Resection and Grafting of  Aortic Root;  Surgeon: [name], MD;  Location: [facility];  Service: Open Heart Surgery;;  . HERNIA REPAIR Left   . LEFT HEART CATH AND CORS/GRAFTS ANGIOGRAPHY N/A 10/07/2016   Procedure: Left Heart Cath and Cors/Grafts Angiography;  Surgeon: Burnell Blanks, MD;  Location: [facility];  Service: Cardiovascular;  Laterality: N/A;  . MULTIPLE EXTRACTIONS WITH ALVEOLOPLASTY N/A 08/13/2016   Procedure: Extraction of tooth #'s 2,4,14 with alveoloplasty;  Surgeon: Lenn Cal, DDS;  Location: [facility];  Service: Oral Surgery;  Laterality: N/A;  . PATENT FORAMEN OVALE(PFO) CLOSURE N/A 10/02/2016   Procedure: PATENT FORAMEN OVALE (PFO) CLOSURE;  Surgeon: [name], MD;  Location: [facility];  Service: Open Heart Surgery;  Laterality: N/A;  . RIGHT/LEFT HEART CATH AND CORONARY ANGIOGRAPHY N/A 08/03/2016   Procedure: Right/Left Heart Cath and Coronary Angiography;  Surgeon: Burnell Blanks, MD;  Location: [facility];  Service: Cardiovascular;  Laterality: N/A;  . TEE WITHOUT CARDIOVERSION N/A 07/07/2016   Procedure: TRANSESOPHAGEAL ECHOCARDIOGRAM (TEE);  Surgeon: Larey Dresser, MD;  Location: [facility];  Service: Cardiovascular;  Laterality: N/A;  . TEE WITHOUT CARDIOVERSION N/A 10/02/2016   Procedure: TRANSESOPHAGEAL ECHOCARDIOGRAM (TEE);  Surgeon: [name], MD;  Location: [facility];  Service: Open Heart Surgery;  Laterality: N/A;  . THORACIC AORTIC ANEURYSM REPAIR N/A 10/02/2016   Procedure: THORACIC ASCENDING ANEURYSM REPAIR  - using 98mm Allograft;  Reimplantation of Left Coronary Button;  Surgeon: [name], MD;  Location: [facility];  Service: Open Heart Surgery;  Laterality: N/A;     Current Meds  Medication Sig  . amoxicillin (AMOXIL) 500 MG capsule TAKE 4 CAPSULES BY MOUTH 1 HOUR PRIOR TO PROCEDURE  . aspirin EC 81 MG tablet Take 81 mg by mouth daily.  Marland Kitchen atorvastatin (LIPITOR) 20 MG tablet TAKE 1 TABLET BY MOUTH DAILY  . furosemide (LASIX) 20 MG tablet  TAKE 1 TABLET BY MOUTH DAILY  . metoprolol tartrate (LOPRESSOR) 25 MG tablet TAKE 1 TABLET BY MOUTH TWICE DAILY. HOLD FOR HR LESS THAN 50  . Multiple Vitamin (MULTIVITAMIN WITH MINERALS) TABS tablet Take 1 tablet by mouth daily.  . nitroGLYCERIN (NITROSTAT) 0.4 MG SL tablet Place 1 tablet (0.4 mg total) under the tongue every 5 (five) minutes x 3 doses as needed for chest pain (if no relief after 3rd dose, proceed to the ED for an evaluation).  . potassium chloride (K-DUR) 10 MEQ tablet TAKE 1 TABLET(10 MEQ) BY MOUTH DAILY     Allergies:   Lisinopril   Social History   Tobacco Use  . Smoking status: Former Smoker    Types: Cigarettes    Quit date: 05/01/1991    Years since quitting: 27.8  . Smokeless tobacco: Never Used  . Tobacco comment: Quit 30 years ago.    Substance Use Topics  . Alcohol use: No  . Drug use: No     Family Hx: The patient's family history includes Heart disease (  age of onset: 73) in his father; Ovarian cancer in his mother. There is no history of Colon cancer.  ROS:   Please see the history of present illness. All other systems reviewed and are negative.   Prior CV studies:   The following studies were reviewed today:  Echocardiogram 02/08/2017: Study Conclusions  - Left ventricle: Hypokinesis of the basal inferolateral wall. The cavity size was normal. Wall thickness was increased in a pattern of moderate LVH. Systolic function was vigorous. The estimated ejection fraction was in the range of 65% to 70%. - Aortic valve: There was mild regurgitation.  Cardiac catheterization 10/07/2016:  Prox Cx to Mid Cx lesion, 30 %stenosed.  Mid LAD lesion, 20 %stenosed.  Ost 2nd Diag lesion, 20 %stenosed.  Dist LAD lesion, 40 %stenosed.  Ost RCA lesion, 100 %stenosed.  SVG graft was visualized by angiography and is very large and anatomically normal.  1. 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  Patent SVG to PDA. The entire RCA fills from the patent vein graft.  3. Mild disease in the Circumflex and LAD. No change since last cath.  4. Normal LVEDP  Labs/Other Tests and Data Reviewed:    EKG:  An ECG dated 05/10/2018 was personally reviewed today and demonstrated:  Sinus rhythm with incomplete right bundle branch block and left anterior fascicular block.  Recent Labs: 05/09/2018: ALT 16; BUN 26; Creatinine, Ser 1.19; Hemoglobin 13.0; Platelets 203; Potassium 3.7; Sodium 139   Recent Lipid Panel Lab Results  Component Value Date/Time   CHOL 110 05/10/2018 04:09 AM   TRIG 164 (H) 05/10/2018 04:09 AM   HDL 34 (L) 05/10/2018 04:09 AM   CHOLHDL 3.2 05/10/2018 04:09 AM   LDLCALC 43 05/10/2018 04:09 AM    Wt Readings from Last 3 Encounters:  02/10/19 255 lb (115.7 kg)  07/06/18 253 lb (114.8 kg)  05/09/18 258 lb 6.1 oz (117.2 kg)     Objective:    Vital Signs:  BP (!) 170/91   Pulse 61   Ht 6' (1.829 m)   Wt 255 lb (115.7 kg)   BMI 34.58 kg/m    General: Patient appeared comfortable at rest seated in a chair. HEENT: Conjunctiva and lids normal.  Wearing glasses. Skin: Normal appearance of color. Lungs: Patient spoke in full sentences, not short of breath.  No audible wheezing or coughing. Musculoskeletal: No kyphosis. Neuropsychiatric: Gaze conjugate.  Speech pattern normal.  Patient observed to move upper extremities.  Affect appropriate.  ASSESSMENT & PLAN:    1.  CAD status post CABG, SVG to the PDA.  He does not report any active angina at this time.  Continue aspirin, statin, and beta-blocker.  I talked with him about trying to reestablish a regular exercise plan.  2.  History of severe aortic regurgitation and bacterial endocarditis status post aortic root replacement with allograft.  3.  Essential hypertension.  Blood pressure measurement elevated today.  I asked him to track blood pressure daily for 2 weeks and provide [place] with the measurements for review.   Would probably not further uptitrate his beta-blocker, we may end up adding either an ARB (had cough on lisinopril), or Norvasc if needed.  4.  Mixed hyperlipidemia, he continues on Lipitor.  LDL 43 in January.  5.  History of cardiomyopathy with normalization of LVEF.  COVID-19 Education: The signs and symptoms of COVID-19 were discussed with the patient and how to seek care for testing (follow  up with PCP or arrange E-visit).  The importance of social distancing was discussed today.  Time:   Today, I have spent 10 minutes with the patient with telehealth technology discussing the above problems.     Medication Adjustments/Labs and Tests Ordered: Current medicines are reviewed at length with the patient today.  Concerns regarding medicines are outlined above.   Tests Ordered: No orders of the defined types were placed in this encounter.   Medication Changes: No orders of the defined types were placed in this encounter.   Follow Up:  In Person 6 months in the [place] office.  Signed, Rozann Lesches, MD  02/10/2019 8:45 AM    [place]

## 2019-02-10 ENCOUNTER — Telehealth (INDEPENDENT_AMBULATORY_CARE_PROVIDER_SITE_OTHER): Payer: Medicare Other | Admitting: Cardiology

## 2019-02-10 ENCOUNTER — Encounter: Payer: Self-pay | Admitting: Cardiology

## 2019-02-10 VITALS — BP 170/91 | HR 61 | Ht 72.0 in | Wt 255.0 lb

## 2019-02-10 DIAGNOSIS — E782 Mixed hyperlipidemia: Secondary | ICD-10-CM

## 2019-02-10 DIAGNOSIS — I10 Essential (primary) hypertension: Secondary | ICD-10-CM | POA: Diagnosis not present

## 2019-02-10 DIAGNOSIS — Z8679 Personal history of other diseases of the circulatory system: Secondary | ICD-10-CM

## 2019-02-10 DIAGNOSIS — Z954 Presence of other heart-valve replacement: Secondary | ICD-10-CM

## 2019-02-10 DIAGNOSIS — I25119 Atherosclerotic heart disease of native coronary artery with unspecified angina pectoris: Secondary | ICD-10-CM

## 2019-02-10 NOTE — Patient Instructions (Signed)
Medication Instructions:  Your physician recommends that you continue on your current medications as directed. Please refer to the Current Medication list given to you today.  *If you need a refill on your cardiac medications before your next appointment, please call your pharmacy*  Lab Work: None today If you have labs (blood work) drawn today and your tests are completely normal, you will receive your results only by: Marland Kitchen MyChart Message (if you have MyChart) OR . A paper copy in the mail If you have any lab test that is abnormal or we need to change your treatment, we will call you to review the results.  Testing/Procedures: None today  Follow-Up: At [facility], you and your health needs are our priority.  As part of our continuing mission to provide you with exceptional heart care, we have created designated Provider Care Teams.  These Care Teams include your primary Cardiologist (physician) and Advanced Practice Providers (APPs -  Physician Assistants and Nurse Practitioners) who all work together to provide you with the care you need, when you need it.  Your next appointment:   6 months  The format for your next appointment:   In Person  Provider:   Rozann Lesches, MD  Other Instructions Keep daily BP log for a few weeks, then drop off at office for review.      Thank you for choosing [facility] !

## 2019-02-27 ENCOUNTER — Telehealth: Payer: Self-pay | Admitting: Cardiology

## 2019-02-27 NOTE — Telephone Encounter (Signed)
Please call - BP readings

## 2019-02-27 NOTE — Telephone Encounter (Signed)
Patient not at home now, will through [place] send his BP readings

## 2019-03-01 NOTE — Telephone Encounter (Signed)
I reviewed the provided data.  We did talk about trying to increase regular activity at his recent telehealth encounter which should help blood pressures overall, but I think that adding low-dose Cozaar 25 mg daily would also be reasonable.  Continue with remaining medications.  If he does elect to start the Cozaar, we should get a BMET in the next 10 days.

## 2019-04-03 DIAGNOSIS — Z23 Encounter for immunization: Secondary | ICD-10-CM | POA: Diagnosis not present

## 2019-04-08 DIAGNOSIS — H5203 Hypermetropia, bilateral: Secondary | ICD-10-CM | POA: Diagnosis not present

## 2019-04-10 DIAGNOSIS — H524 Presbyopia: Secondary | ICD-10-CM | POA: Diagnosis not present

## 2019-04-27 DIAGNOSIS — Z0120 Encounter for dental examination and cleaning without abnormal findings: Secondary | ICD-10-CM | POA: Diagnosis not present

## 2019-05-01 ENCOUNTER — Telehealth: Payer: Self-pay

## 2019-05-01 MED ORDER — NITROGLYCERIN 0.4 MG SL SUBL: 0.4000 mg | SUBLINGUAL_TABLET | SUBLINGUAL | 3 refills | Status: DC | PRN

## 2019-05-01 NOTE — Telephone Encounter (Signed)
Refilled  NTG per request 

## 2019-05-01 NOTE — Telephone Encounter (Signed)
-----   Message from Holley Dexter sent at 05/01/2019  2:47 PM EST ----- Regarding: nitro prescription Please call Pt [phone] [id number] refill Pt states [facility] [phone] need new prescription called in  Thanks renee

## 2019-05-05 ENCOUNTER — Ambulatory Visit: Payer: Medicare Other | Attending: Internal Medicine

## 2019-05-05 ENCOUNTER — Other Ambulatory Visit: Payer: Self-pay

## 2019-05-05 DIAGNOSIS — Z20822 Contact with and (suspected) exposure to covid-19: Secondary | ICD-10-CM | POA: Diagnosis not present

## 2019-05-06 LAB — NOVEL CORONAVIRUS, NAA: SARS-CoV-2, NAA: NOT DETECTED

## 2019-05-11 ENCOUNTER — Other Ambulatory Visit: Payer: Self-pay | Admitting: *Deleted

## 2019-05-11 MED ORDER — FUROSEMIDE 20 MG PO TABS: 20.0000 mg | ORAL_TABLET | Freq: Every day | ORAL | 3 refills | Status: DC

## 2019-05-22 ENCOUNTER — Ambulatory Visit: Payer: Medicare Other | Attending: Internal Medicine

## 2019-05-22 DIAGNOSIS — Z23 Encounter for immunization: Secondary | ICD-10-CM | POA: Insufficient documentation

## 2019-05-22 NOTE — Progress Notes (Signed)
   Covid-19 Vaccination Clinic  Name:  Charles [name]    MRN: [id number] DOB: 07/19/1945  05/22/2019  [name] was observed post Covid-19 immunization for 15 minutes without incidence. He was provided with Vaccine Information Sheet and instruction to access the V-Safe system.   [name] was instructed to call 911 with any severe reactions post vaccine: Marland Kitchen Difficulty breathing  . Swelling of your face and throat  . A fast heartbeat  . A bad rash all over your body  . Dizziness and weakness    Immunizations Administered    Name Date Dose VIS Date Route   Pfizer COVID-19 Vaccine 05/22/2019  5:12 PM 0.3 mL 03/24/2019 Intramuscular   Manufacturer: [place]   Lot: [id number]   [place]: [id number]

## 2019-06-12 DIAGNOSIS — K5289 Other specified noninfective gastroenteritis and colitis: Secondary | ICD-10-CM | POA: Diagnosis not present

## 2019-06-12 DIAGNOSIS — B349 Viral infection, unspecified: Secondary | ICD-10-CM | POA: Diagnosis not present

## 2019-06-12 DIAGNOSIS — R7303 Prediabetes: Secondary | ICD-10-CM | POA: Diagnosis not present

## 2019-06-12 DIAGNOSIS — L0390 Cellulitis, unspecified: Secondary | ICD-10-CM | POA: Diagnosis not present

## 2019-06-16 ENCOUNTER — Ambulatory Visit: Payer: Medicare Other | Attending: Internal Medicine

## 2019-06-16 DIAGNOSIS — Z23 Encounter for immunization: Secondary | ICD-10-CM | POA: Insufficient documentation

## 2019-06-16 NOTE — Progress Notes (Signed)
   Covid-19 Vaccination Clinic  Name:  Charles [name]    MRN: [id number] DOB: 1945/06/17  06/16/2019  [name] was observed post Covid-19 immunization for 15 minutes without incident. He was provided with Vaccine Information Sheet and instruction to access the V-Safe system.   [name] was instructed to call 911 with any severe reactions post vaccine: Marland Kitchen Difficulty breathing  . Swelling of face and throat  . A fast heartbeat  . A bad rash all over body  . Dizziness and weakness   Immunizations Administered    Name Date Dose VIS Date Route   Pfizer COVID-19 Vaccine 06/16/2019  3:49 PM 0.3 mL 03/24/2019 Intramuscular   Manufacturer: [place]   Lot: [id number]   [place]: [id number]

## 2019-06-21 DIAGNOSIS — R944 Abnormal results of kidney function studies: Secondary | ICD-10-CM | POA: Diagnosis not present

## 2019-06-21 DIAGNOSIS — I2510 Atherosclerotic heart disease of native coronary artery without angina pectoris: Secondary | ICD-10-CM | POA: Diagnosis not present

## 2019-06-21 DIAGNOSIS — E782 Mixed hyperlipidemia: Secondary | ICD-10-CM | POA: Diagnosis not present

## 2019-06-21 DIAGNOSIS — D509 Iron deficiency anemia, unspecified: Secondary | ICD-10-CM | POA: Diagnosis not present

## 2019-07-05 ENCOUNTER — Other Ambulatory Visit: Payer: Self-pay | Admitting: Cardiology

## 2019-07-20 ENCOUNTER — Encounter: Payer: Self-pay | Admitting: Cardiology

## 2019-07-20 ENCOUNTER — Other Ambulatory Visit: Payer: Self-pay

## 2019-07-20 ENCOUNTER — Ambulatory Visit (INDEPENDENT_AMBULATORY_CARE_PROVIDER_SITE_OTHER): Payer: Medicare Other | Admitting: Cardiology

## 2019-07-20 VITALS — BP 136/82 | HR 70 | Ht 72.0 in | Wt 262.0 lb

## 2019-07-20 DIAGNOSIS — E782 Mixed hyperlipidemia: Secondary | ICD-10-CM | POA: Diagnosis not present

## 2019-07-20 DIAGNOSIS — I10 Essential (primary) hypertension: Secondary | ICD-10-CM

## 2019-07-20 DIAGNOSIS — I25119 Atherosclerotic heart disease of native coronary artery with unspecified angina pectoris: Secondary | ICD-10-CM

## 2019-07-20 DIAGNOSIS — Z954 Presence of other heart-valve replacement: Secondary | ICD-10-CM

## 2019-07-20 MED ORDER — ATORVASTATIN CALCIUM 40 MG PO TABS: 40.0000 mg | ORAL_TABLET | Freq: Every day | ORAL | 3 refills | Status: DC

## 2019-07-20 MED ORDER — POTASSIUM CHLORIDE ER 10 MEQ PO TBCR: EXTENDED_RELEASE_TABLET | ORAL | 3 refills | Status: DC

## 2019-07-20 NOTE — Patient Instructions (Signed)
Medication Instructions:  Your physician has recommended you make the following change in your medication:  Increase Lipitor to 40 mg Daily   *If you need a refill on your cardiac medications before your next appointment, please call your pharmacy*   Lab Work: NONE   If you have labs (blood work) drawn today and your tests are completely normal, you will receive your results only by: Marland Kitchen MyChart Message (if you have MyChart) OR . A paper copy in the mail If you have any lab test that is abnormal or we need to change your treatment, we will call you to review the results.   Testing/Procedures: NONE    Follow-Up: At [facility], you and your health needs are our priority.  As part of our continuing mission to provide you with exceptional heart care, we have created designated Provider Care Teams.  These Care Teams include your primary Cardiologist (physician) and Advanced Practice Providers (APPs -  Physician Assistants and Nurse Practitioners) who all work together to provide you with the care you need, when you need it.  We recommend signing up for the patient portal called "MyChart".  Sign up information is provided on this After Visit Summary.  MyChart is used to connect with patients for Virtual Visits (Telemedicine).  Patients are able to view lab/test results, encounter notes, upcoming appointments, etc.  Non-urgent messages can be sent to your provider as well.   To learn more about what you can do with MyChart, go to [email or web address].    Your next appointment:   6 month(s)  The format for your next appointment:   In Person  Provider:   Rozann Lesches, MD   Other Instructions Thank you for choosing [facility]!

## 2019-07-20 NOTE — Progress Notes (Signed)
Cardiology Office Note  Date: 07/20/2019   ID: Charles [name], DOB 19-May-1945, MRN [id number]  PCP:  [name], MD  Cardiologist:  Rozann Lesches, MD Electrophysiologist:  None   Chief Complaint  Patient presents with  . Cardiac follow-up    History of Present Illness: Charles [name] is a 74 y.o. male last assessed via telehealth encounter in October 2020.  He presents for a routine visit.  States that he has been doing very well.  Exercising 2 days a week at the [facility], active at his home otherwise.  He also enjoys [employer] for [employer].  Reviewed his recent lab work from March as outlined below.  I did talk with him about increasing his Lipitor for more aggressive LDL control, most recently 90.  He does not report any angina symptoms or nitroglycerin use.  The remainder of his medications were reviewed and outlined below.  Past Medical History:  Diagnosis Date  . Anemia   . Aortic insufficiency    a. 07/07/2016: TEE showing bicuspid aortic valve with severe eccentric AI   . Ascending aortic aneurysm (HCC)    4.4 cm by CT 06/2016  . 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 Bicuspid aortic valve   . Chronic periodontitis   . Coronary artery disease   . Pneumonia 1978  . 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 S/P patent foramen ovale closure 10/02/2016    Past Surgical History:  Procedure Laterality Date  . AORTIC VALVE REPLACEMENT N/A 10/02/2016   Procedure: AORTIC ROOT REPLACEMENT  - using 58mm Allograft;  Surgeon: [name], MD;  Location: [place];  Service: Open Heart Surgery;  Laterality: N/A;  . COLONOSCOPY N/A 12/22/2017   Procedure:  COLONOSCOPY;  Surgeon: Daneil Dolin, MD;  [facility];  Service: Endoscopy;  Laterality: N/A;  2:00pm - refused to move up  . CORONARY ARTERY BYPASS GRAFT N/A 10/02/2016   Procedure: CORONARY ARTERY BYPASS GRAFTING (CABG) x one, using right leg greater saphenous vein harvested endoscopically;  Surgeon: [name], MD;  Location: [place];  Service: Open Heart Surgery;  Laterality: N/A;  . FALSE ANEURYSM REPAIR  10/02/2016   Procedure: REPAIR FALSE ANEURYSM of Aortic Root Resection and Grafting of Aortic Root;  Surgeon: [name], MD;  Location: [place];  Service: Open Heart Surgery;;  . HERNIA REPAIR Left   . LEFT HEART CATH AND CORS/GRAFTS ANGIOGRAPHY N/A 10/07/2016   Procedure: Left Heart Cath and Cors/Grafts Angiography;  Surgeon: Burnell Blanks, MD;  Location: [place] [facility];  Service: Cardiovascular;  Laterality: N/A;  . MULTIPLE EXTRACTIONS WITH ALVEOLOPLASTY N/A 08/13/2016   Procedure: Extraction of tooth #'s 2,4,14 with alveoloplasty;  Surgeon: Lenn Cal, DDS;  Location: [place];  Service: Oral Surgery;  Laterality: N/A;  . PATENT FORAMEN OVALE(PFO) CLOSURE N/A 10/02/2016   Procedure: PATENT FORAMEN OVALE (PFO) CLOSURE;  Surgeon: [name], MD;  Location: [place];  Service: Open Heart Surgery;  Laterality: N/A;  . RIGHT/LEFT HEART CATH AND CORONARY ANGIOGRAPHY N/A 08/03/2016   Procedure: Right/Left Heart Cath and Coronary Angiography;  Surgeon: Burnell Blanks,  MD;  Location: [place] CV LAB;  Service: Cardiovascular;  Laterality: N/A;  . TEE WITHOUT CARDIOVERSION N/A 07/07/2016   Procedure: TRANSESOPHAGEAL ECHOCARDIOGRAM (TEE);  Surgeon: Larey Dresser, MD;  Location: [place];  Service: Cardiovascular;  Laterality: N/A;  . TEE WITHOUT CARDIOVERSION N/A 10/02/2016   Procedure: TRANSESOPHAGEAL ECHOCARDIOGRAM (TEE);  Surgeon: [name], MD;  Location: [place];  Service: Open Heart Surgery;  Laterality: N/A;  . THORACIC AORTIC ANEURYSM  REPAIR N/A 10/02/2016   Procedure: THORACIC ASCENDING ANEURYSM REPAIR  - using 29mm Allograft;  Reimplantation of Left Coronary Button;  Surgeon: [name], MD;  Location: [place];  Service: Open Heart Surgery;  Laterality: N/A;    Current Outpatient Medications  Medication Sig Dispense Refill  . amoxicillin (AMOXIL) 500 MG capsule TAKE 4 CAPSULES BY MOUTH 1 HOUR PRIOR TO PROCEDURE 12 capsule 0  . aspirin EC 81 MG tablet Take 81 mg by mouth daily.    . furosemide (LASIX) 20 MG tablet Take 1 tablet (20 mg total) by mouth daily. 90 tablet 3  . metoprolol tartrate (LOPRESSOR) 25 MG tablet TAKE 1 TABLET BY MOUTH TWICE DAILY. HOLD FOR HR LESS THAN 50 180 tablet 3  . Multiple Vitamin (MULTIVITAMIN WITH MINERALS) TABS tablet Take 1 tablet by mouth daily.    . nitroGLYCERIN (NITROSTAT) 0.4 MG SL tablet Place 1 tablet (0.4 mg total) under the tongue every 5 (five) minutes x 3 doses as needed for chest pain (if no relief after 3rd dose, proceed to the ED for an evaluation). 25 tablet 3  . potassium chloride (KLOR-CON) 10 MEQ tablet TAKE 1 TABLET(10 MEQ) BY MOUTH DAILY 90 tablet 3  . atorvastatin (LIPITOR) 40 MG tablet Take 1 tablet (40 mg total) by mouth daily. 90 tablet 3   No current facility-administered medications for this visit.   Allergies:  Lisinopril   ROS:  No palpitations or syncope.  Physical Exam: VS:  BP 136/82   Pulse 70   Ht 6' (1.829 m)   Wt 262 lb (118.8 kg)   SpO2 94%   BMI 35.53 kg/m , BMI Body mass index is 35.53 kg/m.  Wt Readings from Last 3 Encounters:  07/20/19 262 lb (118.8 kg)  02/10/19 255 lb (115.7 kg)  07/06/18 253 lb (114.8 kg)    General: Patient appears comfortable at rest. HEENT: Conjunctiva and lids normal, wearing a mask. Neck: Supple, no elevated JVP or carotid bruits, no thyromegaly. Lungs: Clear to auscultation, nonlabored breathing at rest. Cardiac: Regular rate and rhythm, no S3, soft systolic murmur, no pericardial rub. Abdomen: Soft, bowel  sounds present. Extremities: No pitting edema, distal pulses 2+.  ECG:  An ECG dated 05/10/2018 was personally reviewed today and demonstrated:  Sinus rhythm with incomplete right bundle branch block and left anterior fascicular block.  Recent Labwork:    Component Value Date/Time   CHOL 110 05/10/2018 0409   TRIG 164 (H) 05/10/2018 0409   HDL 34 (L) 05/10/2018 0409   CHOLHDL 3.2 05/10/2018 0409   VLDL 33 05/10/2018 0409   LDLCALC 43 05/10/2018 0409  March 2021: Hemoglobin 13.9, platelets 199, BUN 22, creatinine 1.1, potassium 4.4, AST 13, ALT 13, cholesterol 158, triglycerides 164, HDL 39, LDL 90, hemoglobin A1c 5.8%  Other Studies Reviewed Today:  Echocardiogram 02/08/2017: Study Conclusions  - Left ventricle: Hypokinesis of the basal inferolateral wall. The cavity size was normal. Wall thickness was increased in a pattern of moderate LVH. Systolic function was vigorous. The estimated ejection fraction  was in the range of 65% to 70%. - Aortic valve: There was mild regurgitation.  Cardiac catheterization 10/07/2016:  Prox Cx to Mid Cx lesion, 30 %stenosed.  Mid LAD lesion, 20 %stenosed.  Ost 2nd Diag lesion, 20 %stenosed.  Dist LAD lesion, 40 %stenosed.  Ost RCA lesion, 100 %stenosed.  SVG graft was visualized by angiography and is very large and anatomically normal.  1. 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 Patent SVG to PDA. The entire RCA fills from the patent vein graft.  3. Mild disease in the Circumflex and LAD. No change since last cath.  4. Normal LVEDP  Assessment and Plan:  1.  CAD status post CABG with SVG to PDA.  He is doing well without active angina symptoms on medical therapy and continues to exercise.  Continue aspirin, Lopressor, and statin.  2.  Mixed hyperlipidemia, recent LDL 90.  We will increase Lipitor to 40 mg daily with goal of getting his LDL under 70 if possible.  3.   Essential hypertension, blood pressure is better today compared that at last assessment.  Continue with observation on current regimen.  4.  History of severe aortic regurgitation and bacterial endocarditis status post aortic root replacement with allograft.  He has done well postoperatively, last echocardiogram showed only mild aortic regurgitation.  No significant change in cardiac exam.  Medication Adjustments/Labs and Tests Ordered: Current medicines are reviewed at length with the patient today.  Concerns regarding medicines are outlined above.   Tests Ordered: No orders of the defined types were placed in this encounter.   Medication Changes: Meds ordered this encounter  Medications  . atorvastatin (LIPITOR) 40 MG tablet    Sig: Take 1 tablet (40 mg total) by mouth daily.    Dispense:  90 tablet    Refill:  3  . potassium chloride (KLOR-CON) 10 MEQ tablet    Sig: TAKE 1 TABLET(10 MEQ) BY MOUTH DAILY    Dispense:  90 tablet    Refill:  3    Disposition:  Follow up 6 months in the [place] office.  Signed, Satira Sark, MD, [facility] 07/20/2019 2:13 PM    [facility] [address] [place], [place] [address] Phone: [phone]; Fax: [phone]

## 2019-07-26 ENCOUNTER — Other Ambulatory Visit: Payer: Self-pay | Admitting: Cardiology

## 2019-07-26 ENCOUNTER — Other Ambulatory Visit: Payer: Self-pay

## 2019-07-26 NOTE — Telephone Encounter (Signed)
Spoke with pt. He states that he is running out of potassium and needs a refill sent in. I informed him that his prescription was sent in on 07/20/19 and will need to contact [facility].

## 2019-07-26 NOTE — Telephone Encounter (Signed)
Pt states he need to speak to someone [id number] prescription  Please call [phone]   Thanks renee

## 2019-08-28 ENCOUNTER — Telehealth: Payer: Self-pay

## 2019-08-28 ENCOUNTER — Other Ambulatory Visit: Payer: Self-pay

## 2019-08-28 MED ORDER — ATORVASTATIN CALCIUM 40 MG PO TABS: 40.0000 mg | ORAL_TABLET | Freq: Every day | ORAL | 3 refills | Status: DC

## 2019-08-28 NOTE — Telephone Encounter (Signed)
Lm that yes his lipitor was increased to 40 mg at 07/20/19 office visit

## 2019-08-28 NOTE — Telephone Encounter (Signed)
Pt is questioning if atorvastatin was increased.   Please call [phone]   Thanks renee

## 2019-08-28 NOTE — Telephone Encounter (Signed)
Refilled lipitor 40 mg qd #90 with RF:3

## 2019-10-27 DIAGNOSIS — H903 Sensorineural hearing loss, bilateral: Secondary | ICD-10-CM | POA: Diagnosis not present

## 2019-11-13 ENCOUNTER — Other Ambulatory Visit: Payer: Self-pay | Admitting: Cardiology

## 2019-12-26 DIAGNOSIS — K5289 Other specified noninfective gastroenteritis and colitis: Secondary | ICD-10-CM | POA: Diagnosis not present

## 2019-12-26 DIAGNOSIS — M791 Myalgia, unspecified site: Secondary | ICD-10-CM | POA: Diagnosis not present

## 2019-12-26 DIAGNOSIS — Z952 Presence of prosthetic heart valve: Secondary | ICD-10-CM | POA: Diagnosis not present

## 2019-12-26 DIAGNOSIS — D509 Iron deficiency anemia, unspecified: Secondary | ICD-10-CM | POA: Diagnosis not present

## 2019-12-30 ENCOUNTER — Other Ambulatory Visit: Payer: Self-pay | Admitting: Cardiology

## 2020-01-17 DIAGNOSIS — R944 Abnormal results of kidney function studies: Secondary | ICD-10-CM | POA: Diagnosis not present

## 2020-01-17 DIAGNOSIS — Z0001 Encounter for general adult medical examination with abnormal findings: Secondary | ICD-10-CM | POA: Diagnosis not present

## 2020-01-17 DIAGNOSIS — I2510 Atherosclerotic heart disease of native coronary artery without angina pectoris: Secondary | ICD-10-CM | POA: Diagnosis not present

## 2020-01-17 DIAGNOSIS — E782 Mixed hyperlipidemia: Secondary | ICD-10-CM | POA: Diagnosis not present

## 2020-01-19 ENCOUNTER — Other Ambulatory Visit (HOSPITAL_COMMUNITY): Payer: Self-pay | Admitting: Radiology

## 2020-01-19 DIAGNOSIS — R062 Wheezing: Secondary | ICD-10-CM

## 2020-01-20 ENCOUNTER — Other Ambulatory Visit (INDEPENDENT_AMBULATORY_CARE_PROVIDER_SITE_OTHER): Payer: Self-pay | Admitting: Otolaryngology

## 2020-01-22 ENCOUNTER — Other Ambulatory Visit (HOSPITAL_COMMUNITY)
Admission: RE | Admit: 2020-01-22 | Discharge: 2020-01-22 | Disposition: A | Discharge status: Home / Self Care | Payer: Medicare Other | Source: Ambulatory Visit | Attending: Internal Medicine | Admitting: Internal Medicine

## 2020-01-22 ENCOUNTER — Other Ambulatory Visit: Payer: Self-pay

## 2020-01-22 DIAGNOSIS — Z01812 Encounter for preprocedural laboratory examination: Secondary | ICD-10-CM | POA: Diagnosis not present

## 2020-01-22 DIAGNOSIS — Z20822 Contact with and (suspected) exposure to covid-19: Secondary | ICD-10-CM | POA: Insufficient documentation

## 2020-01-22 LAB — SARS CORONAVIRUS 2 (TAT 6-24 HRS): SARS Coronavirus 2: NEGATIVE

## 2020-01-25 ENCOUNTER — Ambulatory Visit (HOSPITAL_COMMUNITY)
Admission: RE | Admit: 2020-01-25 | Discharge: 2020-01-25 | Disposition: A | Discharge status: Home / Self Care | Payer: Medicare Other | Source: Ambulatory Visit | Attending: Internal Medicine | Admitting: Internal Medicine

## 2020-01-25 ENCOUNTER — Other Ambulatory Visit: Payer: Self-pay

## 2020-01-25 DIAGNOSIS — R062 Wheezing: Secondary | ICD-10-CM | POA: Insufficient documentation

## 2020-01-25 LAB — PULMONARY FUNCTION TEST
DL/VA % pred: 93 %
DL/VA: 3.73 ml/min/mmHg/L
DLCO unc % pred: 72 %
DLCO unc: 19.6 ml/min/mmHg
FEF 25-75 Post: 2.58 L/sec
FEF 25-75 Pre: 1.27 L/sec
FEF2575-%Change-Post: 103 %
FEF2575-%Pred-Post: 100 %
FEF2575-%Pred-Pre: 49 %
FEV1-%Change-Post: 15 %
FEV1-%Pred-Post: 75 %
FEV1-%Pred-Pre: 65 %
FEV1-Post: 2.59 L
FEV1-Pre: 2.25 L
FEV1FVC-%Change-Post: 6 %
FEV1FVC-%Pred-Pre: 94 %
FEV6-%Change-Post: 8 %
FEV6-%Pred-Post: 78 %
FEV6-%Pred-Pre: 71 %
FEV6-Post: 3.47 L
FEV6-Pre: 3.18 L
FEV6FVC-%Change-Post: 0 %
FEV6FVC-%Pred-Post: 105 %
FEV6FVC-%Pred-Pre: 104 %
FVC-%Change-Post: 8 %
FVC-%Pred-Post: 74 %
FVC-%Pred-Pre: 68 %
FVC-Post: 3.5 L
FVC-Pre: 3.24 L
Post FEV1/FVC ratio: 74 %
Post FEV6/FVC ratio: 99 %
Pre FEV1/FVC ratio: 70 %
Pre FEV6/FVC Ratio: 98 %
RV % pred: 105 %
RV: 2.73 L
TLC % pred: 82 %
TLC: 6.13 L

## 2020-01-25 MED ORDER — ALBUTEROL SULFATE (2.5 MG/3ML) 0.083% IN NEBU
2.5000 mg | INHALATION_SOLUTION | Freq: Once | RESPIRATORY_TRACT | Status: AC
  Administered 2020-01-25: 2.5 mg via RESPIRATORY_TRACT

## 2020-01-30 ENCOUNTER — Encounter: Payer: Self-pay | Admitting: Cardiology

## 2020-01-30 ENCOUNTER — Ambulatory Visit (INDEPENDENT_AMBULATORY_CARE_PROVIDER_SITE_OTHER): Payer: Medicare Other | Admitting: Cardiology

## 2020-01-30 VITALS — BP 130/74 | HR 61 | Ht 72.0 in | Wt 276.0 lb

## 2020-01-30 DIAGNOSIS — I25119 Atherosclerotic heart disease of native coronary artery with unspecified angina pectoris: Secondary | ICD-10-CM

## 2020-01-30 DIAGNOSIS — E782 Mixed hyperlipidemia: Secondary | ICD-10-CM

## 2020-01-30 DIAGNOSIS — Z954 Presence of other heart-valve replacement: Secondary | ICD-10-CM | POA: Diagnosis not present

## 2020-01-30 NOTE — Progress Notes (Signed)
Cardiology Office Note  Date: 01/30/2020   ID: Charles [name], DOB 1945/11/26, MRN [id number]  PCP:  [name], MD  Cardiologist:  Rozann Lesches, MD Electrophysiologist:  None   Chief Complaint  Patient presents with  . Cardiac follow-up    History of Present Illness: Charles [name] is a 74 y.o. male last seen in April.  He presents for a routine visit.  Reports no obvious angina symptoms.  No increasing shortness of breath, but occasionally he does notice wheezing for which he uses an MDI.  He is still exercising at the gym, 2 to 3 days a week.  Does aerobic exercises and also uses some of the machines.  I reviewed his recent lab work from September as noted below.  He continues to follow with Dr. Nevada Crane.  I personally reviewed his ECG today which shows sinus rhythm with prolonged PR interval, right bundle branch block, and left anterior fascicular block.  He reports an occasional sense of brief palpitation and forceful heartbeat, likely PVCs.  No associated syncope or prolonged symptoms.  He remains on low-dose beta-blocker.  Echocardiogram from January of last year is noted below.  Past Medical History:  Diagnosis Date  . Anemia   . Aortic insufficiency    a. 07/07/2016: TEE showing bicuspid aortic valve with severe eccentric AI   . Ascending aortic aneurysm (HCC)    4.4 cm by CT 06/2016  . 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 Bicuspid aortic valve   . Chronic periodontitis   . Coronary artery disease   . Pneumonia 1978  . 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 S/P patent foramen ovale closure 10/02/2016    Past Surgical History:  Procedure  Laterality Date  . AORTIC VALVE REPLACEMENT N/A 10/02/2016   Procedure: AORTIC ROOT REPLACEMENT  - using 69mm Allograft;  Surgeon: [name], MD;  Location: [facility];  Service: Open Heart Surgery;  Laterality: N/A;  . COLONOSCOPY N/A 12/22/2017   Procedure: COLONOSCOPY;  Surgeon: Daneil Dolin, MD;  [facility];  Service: Endoscopy;  Laterality: N/A;  2:00pm - refused to move up  . CORONARY ARTERY BYPASS GRAFT N/A 10/02/2016   Procedure: CORONARY ARTERY BYPASS GRAFTING (CABG) x one, using right leg greater saphenous vein harvested endoscopically;  Surgeon: [name], MD;  Location: [facility];  Service: Open Heart Surgery;  Laterality: N/A;  . FALSE ANEURYSM REPAIR  10/02/2016   Procedure: REPAIR FALSE ANEURYSM of Aortic Root Resection and Grafting of Aortic Root;  Surgeon: [name], MD;  Location: [facility] [place];  Service: Open Heart Surgery;;  . HERNIA REPAIR Left   . LEFT HEART CATH AND CORS/GRAFTS ANGIOGRAPHY N/A 10/07/2016   Procedure: Left Heart Cath and Cors/Grafts Angiography;  Surgeon: Burnell Blanks, MD;  Location: [facility];  Service: Cardiovascular;  Laterality: N/A;  . MULTIPLE EXTRACTIONS WITH ALVEOLOPLASTY N/A 08/13/2016   Procedure: Extraction of tooth #'s 2,4,14 with alveoloplasty;  Surgeon: Lenn Cal, DDS;  Location: [facility];  Service: Oral Surgery;  Laterality: N/A;  . PATENT FORAMEN OVALE(PFO) CLOSURE N/A 10/02/2016   Procedure: PATENT FORAMEN OVALE (PFO)  CLOSURE;  Surgeon: [name], MD;  Location: [facility];  Service: Open Heart Surgery;  Laterality: N/A;  . RIGHT/LEFT HEART CATH AND CORONARY ANGIOGRAPHY N/A 08/03/2016   Procedure: Right/Left Heart Cath and Coronary Angiography;  Surgeon: Burnell Blanks, MD;  Location: [facility];  Service: Cardiovascular;  Laterality: N/A;  . TEE WITHOUT CARDIOVERSION N/A 07/07/2016   Procedure: TRANSESOPHAGEAL ECHOCARDIOGRAM (TEE);  Surgeon: Larey Dresser, MD;  Location: [facility];   Service: Cardiovascular;  Laterality: N/A;  . TEE WITHOUT CARDIOVERSION N/A 10/02/2016   Procedure: TRANSESOPHAGEAL ECHOCARDIOGRAM (TEE);  Surgeon: [name], MD;  Location: [facility];  Service: Open Heart Surgery;  Laterality: N/A;  . THORACIC AORTIC ANEURYSM REPAIR N/A 10/02/2016   Procedure: THORACIC ASCENDING ANEURYSM REPAIR  - using 24mm Allograft;  Reimplantation of Left Coronary Button;  Surgeon: [name], MD;  Location: [facility];  Service: Open Heart Surgery;  Laterality: N/A;    Current Outpatient Medications  Medication Sig Dispense Refill  . amoxicillin (AMOXIL) 500 MG capsule TAKE 4 CAPSULES BY MOUTH 1 HOUR PRIOR TO PROCEDURE 12 capsule 0  . aspirin EC 81 MG tablet Take 81 mg by mouth daily.    Marland Kitchen atorvastatin (LIPITOR) 40 MG tablet Take 1 tablet (40 mg total) by mouth daily. 90 tablet 3  . fluticasone (FLONASE) 50 MCG/ACT nasal spray PLACE 2 SPRAYS INTO EACH NOSTRIL EVERY NIGHT AT BEDTIME 48 g 6  . furosemide (LASIX) 20 MG tablet Take 1 tablet (20 mg total) by mouth daily. 90 tablet 3  . metoprolol tartrate (LOPRESSOR) 25 MG tablet TAKE 1 TABLET BY MOUTH TWICE DAILY HOLD FOR HR LESS THAN 50 180 tablet 3  . Multiple Vitamin (MULTIVITAMIN WITH MINERALS) TABS tablet Take 1 tablet by mouth daily.    . nitroGLYCERIN (NITROSTAT) 0.4 MG SL tablet Place 1 tablet (0.4 mg total) under the tongue every 5 (five) minutes x 3 doses as needed for chest pain (if no relief after 3rd dose, proceed to the ED for an evaluation). 25 tablet 3  . potassium chloride (KLOR-CON) 10 MEQ tablet TAKE 1 TABLET BY MOUTH DAILY 90 tablet 3   No current facility-administered medications for this visit.   Allergies:  Lisinopril   ROS: No syncope.  Physical Exam: VS:  BP 130/74   Pulse 61   Ht 6' (1.829 m)   Wt 276 lb (125.2 kg)   SpO2 92%   BMI 37.43 kg/m , BMI Body mass index is 37.43 kg/m.  Wt Readings from Last 3 Encounters:  01/30/20 276 lb (125.2 kg)  07/20/19 262 lb (118.8 kg)  02/10/19  255 lb (115.7 kg)    General: Patient appears comfortable at rest. HEENT: Conjunctiva and lids normal, wearing a mask. Neck: Supple, no elevated JVP or carotid bruits, no thyromegaly. Lungs: Clear to auscultation, nonlabored breathing at rest. Cardiac: Regular rate and rhythm, no S3, soft systolic murmur, no pericardial rub. Abdomen: Protuberant, bowel sounds present, no guarding or rebound. Extremities: No pitting edema, distal pulses 2+.  ECG:  An ECG dated 05/10/2018 was personally reviewed today and demonstrated:  Sinus bradycardia with prolonged PR interval, right bundle branch block, left anterior fascicular block, increased voltage.  Recent Labwork:  September 2021: Hemoglobin 13.4, platelets 201, BUN 23, creatinine 1.14, potassium 4.2, AST 13, ALT 15, cholesterol 151, triglycerides 137, HDL 40, LDL 87  Other Studies Reviewed Today:  Cardiac catheterization 10/07/2016:  Prox Cx to Mid Cx lesion, 30 %stenosed.  Mid LAD lesion, 20 %  stenosed.  Ost 2nd Diag lesion, 20 %stenosed.  Dist LAD lesion, 40 %stenosed.  Ost RCA lesion, 100 %stenosed.  SVG graft was visualized by angiography and is very large and anatomically normal.  1. 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 Patent SVG to PDA. The entire RCA fills from the patent vein graft.  3. Mild disease in the Circumflex and LAD. No change since last cath.  4. Normal LVEDP  Echocardiogram 05/09/2018: - Left ventricle: Wall thickness was increased in a pattern of  moderate LVH. Systolic function was normal. The estimated  ejection fraction was 65%. Findings consistent with left  ventricular diastolic dysfunction, grade indeterminate. Doppler  parameters are consistent with high ventricular filling pressure.  - Regional wall motion abnormality: Mild hypokinesis of the basal  inferior myocardium.  - Ventricular septum: Septal motion showed abnormal function and    dyssynergy. These changes are consistent with intraventricular  conduction delay.  - Aortic valve: There was trivial regurgitation.  - Aorta: Aortic root dimension: 39 mm (ED).  - Mitral valve: Calcified annulus. Normal thickness leaflets.  Assessment and Plan:  1.  Status post CABG with SVG to PDA.  No progressive angina symptoms reported with stable exercise plan.  I reviewed his ECG.  Continue aspirin, Lopressor, Lipitor, and as needed nitroglycerin.  2.  History of severe aortic regurgitation and bacterial endocarditis status post aortic root replacement with allograft.  He remains asymptomatic, echocardiogram from last year is noted above at which point he had only trivial aortic regurgitation.  No changes on examination, continue with observation for now.  3.  Mixed hyperlipidemia, he continues on Lipitor with follow-up by Dr. Nevada Crane, recent LDL 87.  4.  Essential hypertension by history.  Systolic is 340 today.  Medication Adjustments/Labs and Tests Ordered: Current medicines are reviewed at length with the patient today.  Concerns regarding medicines are outlined above.   Tests Ordered: Orders Placed This Encounter  Procedures  . EKG 12-Lead    Medication Changes: No orders of the defined types were placed in this encounter.   Disposition:  Follow up 6 months.  Signed, Satira Sark, MD, [facility] 01/30/2020 2:00 PM    [facility] [place], [place], [place] [address] Phone: [phone]; Fax: [phone]

## 2020-01-30 NOTE — Patient Instructions (Signed)

## 2020-02-22 DIAGNOSIS — I10 Essential (primary) hypertension: Secondary | ICD-10-CM | POA: Diagnosis not present

## 2020-02-22 DIAGNOSIS — E7849 Other hyperlipidemia: Secondary | ICD-10-CM | POA: Diagnosis not present

## 2020-03-10 ENCOUNTER — Other Ambulatory Visit: Payer: Self-pay | Admitting: Cardiology

## 2020-04-08 ENCOUNTER — Telehealth: Payer: Self-pay | Admitting: Cardiology

## 2020-04-08 NOTE — Telephone Encounter (Signed)
New message    Dr Diona Browner told him he could drink 1 glass of red wine a day, is a Rose wine the same as red wine?

## 2020-04-08 NOTE — Telephone Encounter (Signed)
Patient is going to speak with the retailers at his local wine store.

## 2020-04-12 DIAGNOSIS — E7849 Other hyperlipidemia: Secondary | ICD-10-CM | POA: Diagnosis not present

## 2020-04-12 DIAGNOSIS — I10 Essential (primary) hypertension: Secondary | ICD-10-CM | POA: Diagnosis not present

## 2020-06-06 DIAGNOSIS — D2239 Melanocytic nevi of other parts of face: Secondary | ICD-10-CM | POA: Diagnosis not present

## 2020-06-06 DIAGNOSIS — L821 Other seborrheic keratosis: Secondary | ICD-10-CM | POA: Diagnosis not present

## 2020-07-04 DIAGNOSIS — D2239 Melanocytic nevi of other parts of face: Secondary | ICD-10-CM | POA: Diagnosis not present

## 2020-07-10 DIAGNOSIS — I10 Essential (primary) hypertension: Secondary | ICD-10-CM | POA: Diagnosis not present

## 2020-07-10 DIAGNOSIS — E1165 Type 2 diabetes mellitus with hyperglycemia: Secondary | ICD-10-CM | POA: Diagnosis not present

## 2020-07-10 DIAGNOSIS — E782 Mixed hyperlipidemia: Secondary | ICD-10-CM | POA: Diagnosis not present

## 2020-08-19 ENCOUNTER — Other Ambulatory Visit: Payer: Self-pay | Admitting: Cardiology

## 2020-08-20 DIAGNOSIS — J309 Allergic rhinitis, unspecified: Secondary | ICD-10-CM | POA: Diagnosis not present

## 2020-08-20 DIAGNOSIS — I2510 Atherosclerotic heart disease of native coronary artery without angina pectoris: Secondary | ICD-10-CM | POA: Diagnosis not present

## 2020-08-20 DIAGNOSIS — E1165 Type 2 diabetes mellitus with hyperglycemia: Secondary | ICD-10-CM | POA: Diagnosis not present

## 2020-08-28 DIAGNOSIS — H5203 Hypermetropia, bilateral: Secondary | ICD-10-CM | POA: Diagnosis not present

## 2020-10-10 DIAGNOSIS — E1165 Type 2 diabetes mellitus with hyperglycemia: Secondary | ICD-10-CM | POA: Diagnosis not present

## 2020-10-10 DIAGNOSIS — J309 Allergic rhinitis, unspecified: Secondary | ICD-10-CM | POA: Diagnosis not present

## 2020-10-15 ENCOUNTER — Other Ambulatory Visit: Payer: Self-pay | Admitting: Cardiology

## 2020-10-16 ENCOUNTER — Other Ambulatory Visit: Payer: Self-pay | Admitting: Cardiology

## 2020-10-28 DIAGNOSIS — R972 Elevated prostate specific antigen [PSA]: Secondary | ICD-10-CM | POA: Diagnosis not present

## 2020-10-28 DIAGNOSIS — E1165 Type 2 diabetes mellitus with hyperglycemia: Secondary | ICD-10-CM | POA: Diagnosis not present

## 2020-10-28 DIAGNOSIS — E785 Hyperlipidemia, unspecified: Secondary | ICD-10-CM | POA: Diagnosis not present

## 2020-11-04 DIAGNOSIS — D509 Iron deficiency anemia, unspecified: Secondary | ICD-10-CM | POA: Diagnosis not present

## 2020-11-04 DIAGNOSIS — I10 Essential (primary) hypertension: Secondary | ICD-10-CM | POA: Diagnosis not present

## 2020-11-04 DIAGNOSIS — I2510 Atherosclerotic heart disease of native coronary artery without angina pectoris: Secondary | ICD-10-CM | POA: Diagnosis not present

## 2020-11-04 DIAGNOSIS — J449 Chronic obstructive pulmonary disease, unspecified: Secondary | ICD-10-CM | POA: Diagnosis not present

## 2020-11-10 ENCOUNTER — Other Ambulatory Visit: Payer: Self-pay | Admitting: Cardiology

## 2020-11-11 ENCOUNTER — Telehealth: Payer: Self-pay | Admitting: Cardiology

## 2020-11-11 MED ORDER — POTASSIUM CHLORIDE ER 10 MEQ PO TBCR: 10.0000 meq | EXTENDED_RELEASE_TABLET | Freq: Every day | ORAL | 3 refills | Status: DC

## 2020-11-11 NOTE — Telephone Encounter (Signed)
    1. Which medications need to be refilled? (please list name of each medication and dose if known)Potassium Chloride 10 MEQ  2. Which pharmacy/location (including street and city if local pharmacy) is medication to be sent to? [facility], [address] [place]   3. Do they need a 30 day or 90 day supply? 30  Patient has follow up appointment with Dr. Domenic Polite.

## 2020-11-11 NOTE — Telephone Encounter (Signed)
Refilled per request potassium 10 meq daily

## 2020-12-23 ENCOUNTER — Ambulatory Visit: Payer: Medicare Other | Admitting: Cardiology

## 2021-01-10 DIAGNOSIS — E1165 Type 2 diabetes mellitus with hyperglycemia: Secondary | ICD-10-CM | POA: Diagnosis not present

## 2021-01-10 DIAGNOSIS — I10 Essential (primary) hypertension: Secondary | ICD-10-CM | POA: Diagnosis not present

## 2021-01-15 ENCOUNTER — Telehealth: Payer: Self-pay | Admitting: Cardiology

## 2021-01-15 DIAGNOSIS — R0602 Shortness of breath: Secondary | ICD-10-CM

## 2021-01-15 NOTE — Telephone Encounter (Signed)
Pt notified and agrees with plan of care. Order placed for echo.

## 2021-01-15 NOTE — Telephone Encounter (Signed)
Patient was seen today for SOB  for 2 weeks at rest ,  his has been having problem with having his breathe being taking awayand this last for several seconds, he currently is being treated for COPD.   They did EKG and it was normal , no syncopal or dizziness .

## 2021-01-15 NOTE — Telephone Encounter (Signed)
Spoke with pt who states that he has had SOB for the last 2 weeks. Denies chest pain at this time. Current weight is 285lbs. Pt states that he is not being treated for COPD that he was told he was borderline COPD. No other complaints at this time. He has an appt on 03/05/21 and would like to know if this appt should be moved up?

## 2021-02-08 ENCOUNTER — Other Ambulatory Visit: Payer: Self-pay | Admitting: Cardiology

## 2021-02-10 NOTE — Progress Notes (Signed)
Cardiology Office Note    Date:  02/17/2021   ID:  Charles [name], DOB 11/28/1945, MRN [id number]   PCP:  [name], MD   [place]  Cardiologist:  Rozann Lesches, MD   Advanced Practice Provider:  No care team member to display Electrophysiologist:  None   [phone]   Chief Complaint  Patient presents with   Hypertension   Follow-up     History of Present Illness:  Charles [name] is a 75 y.o. 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  History of cardiomyopathy with normalization of LVEF, hyperlipidemia  Patient called in with shortness of breath 01/15/2021.  Dr. Domenic Polite ordered an echo.  He also has untreated COPD.  Echo 02/14/2021 normal LVEF 60 to 65% with mild diastolic dysfunction, aortic valve allograft functioning normally with only mild aortic regurgitation and mildly dilated aortic root.  Patient says he was sitting in his recliner and get short of breath for 5-10 seconds. Weight was over 280 lbs and he started dieting and exercising and lost 10 lbs and now feeling better. He watches sodium intake. Eats out twice a week. Denies any swelling, palpitations, chest pain.  Patient had PFTs last year that suggested COPD.  He does use an inhaler on occasion.  Blood pressure has been running high at home.  Past Medical History:  Diagnosis Date   Anemia    Aortic insufficiency    a. 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, and replacement of ascending thoracic aortic aneurysm   S/P CABG x 1 10/02/2016   SVG to RCA with EVH via right thigh   S/P patent foramen ovale closure 10/02/2016    Past Surgical History:  Procedure Laterality Date   AORTIC VALVE REPLACEMENT N/A 10/02/2016   Procedure: AORTIC ROOT REPLACEMENT  - using 55m Allograft;  Surgeon: ORexene Alberts MD;  Location: [facility]  Service: Open Heart Surgery;  Laterality: N/A;   COLONOSCOPY N/A 12/22/2017   Procedure: COLONOSCOPY;  Surgeon: RDaneil Dolin MD;  [facility];  Service: Endoscopy;  Laterality: N/A;  2:00pm - refused to move up   CGrafN/A 10/02/2016   Procedure: CORONARY ARTERY BYPASS GRAFTING (CABG) x one, using right leg greater saphenous vein harvested endoscopically;  Surgeon: ORexene Alberts MD;  Location: [facility]  Service: Open Heart Surgery;  Laterality: N/A;   FALSE ANEURYSM REPAIR  10/02/2016   Procedure: REPAIR FALSE ANEURYSM of Aortic Root Resection and Grafting of Aortic Root;  Surgeon: ORexene Alberts MD;  Location: [facility]  Service: Open Heart Surgery;;   HERNIA REPAIR Left    LEFT HEART CATH AND CORS/GRAFTS ANGIOGRAPHY N/A 10/07/2016   Procedure: Left Heart Cath and Cors/Grafts Angiography;  Surgeon: MAngelena Form  Annita Brod, MD;  Location: [place] [facility];  Service: Cardiovascular;  Laterality: N/A;   MULTIPLE EXTRACTIONS WITH ALVEOLOPLASTY N/A 08/13/2016   Procedure: Extraction of tooth #'s 2,4,14 with alveoloplasty;  Surgeon: Lenn Cal, DDS;  Location: [facility];  Service: Oral Surgery;  Laterality: N/A;   PATENT FORAMEN OVALE(PFO) CLOSURE N/A 10/02/2016   Procedure: PATENT FORAMEN OVALE (PFO) CLOSURE;  Surgeon: [name], MD;  Location: [facility];  Service: Open Heart Surgery;  Laterality: N/A;   RIGHT/LEFT HEART CATH AND CORONARY  ANGIOGRAPHY N/A 08/03/2016   Procedure: Right/Left Heart Cath and Coronary Angiography;  Surgeon: Burnell Blanks, MD;  Location: [place] [facility];  Service: Cardiovascular;  Laterality: N/A;   TEE WITHOUT CARDIOVERSION N/A 07/07/2016   Procedure: TRANSESOPHAGEAL ECHOCARDIOGRAM (TEE);  Surgeon: Larey Dresser, MD;  Location: [place];  Service: Cardiovascular;  Laterality: N/A;   TEE WITHOUT CARDIOVERSION N/A 10/02/2016   Procedure: TRANSESOPHAGEAL ECHOCARDIOGRAM (TEE);  Surgeon: [name], MD;  Location: [facility];  Service: Open Heart Surgery;  Laterality: N/A;   THORACIC AORTIC ANEURYSM REPAIR N/A 10/02/2016   Procedure: THORACIC ASCENDING ANEURYSM REPAIR  - using 42m Allograft;  Reimplantation of Left Coronary Button;  Surgeon: ORexene Alberts MD;  Location: [facility]  Service: Open Heart Surgery;  Laterality: N/A;    Current Medications: Current Meds  Medication Sig   amoxicillin (AMOXIL) 500 MG capsule TAKE 4 CAPSULES BY MOUTH 1 HOUR BEFORE PROCEDURE   aspirin EC 81 MG tablet Take 81 mg by mouth daily.   atorvastatin (LIPITOR) 40 MG tablet TAKE 1 TABLET(40 MG) BY MOUTH DAILY   fluticasone (FLONASE) 50 MCG/ACT nasal spray PLACE 2 SPRAYS INTO EACH NOSTRIL EVERY NIGHT AT BEDTIME   furosemide (LASIX) 20 MG tablet TAKE 1 TABLET(20 MG) BY MOUTH DAILY   losartan (COZAAR) 25 MG tablet Take 1 tablet (25 mg total) by mouth daily.   metoprolol tartrate (LOPRESSOR) 25 MG tablet TAKE 1 TABLET BY MOUTH TWICE DAILY HOLD FOR HR LESS THAN 50   Multiple Vitamin (MULTIVITAMIN WITH MINERALS) TABS tablet Take 1 tablet by mouth daily.   nitroGLYCERIN (NITROSTAT) 0.4 MG SL tablet Place 1 tablet (0.4 mg total) under the tongue every 5 (five) minutes x 3 doses as needed for chest pain (if no relief after 3rd dose, proceed to the ED for an evaluation).   potassium chloride (KLOR-CON) 10 MEQ tablet TAKE 1 TABLET(10 MEQ) BY MOUTH DAILY     Allergies:   Lisinopril   Social History   Socioeconomic  History   Marital status: Widowed    Spouse name: Not on file   Number of children: 3   Years of education: Not on file   Highest education level: Not on file  Occupational History   Not on file  Tobacco Use   Smoking status: Former    Types: Cigarettes    Quit date: 05/01/1991    Years since quitting: 29.8   Smokeless tobacco: Never   Tobacco comments:    Quit 30 years ago.    Vaping Use   Vaping Use: Never used  Substance and Sexual Activity   Alcohol use: Yes    Alcohol/week: 7.0 standard drinks    Types: 7 Glasses of wine per week   Drug use: No   Sexual activity: Not on file  Other Topics Concern   Not on file  Social History Narrative   Not on file   Social Determinants of Health   Financial Resource  Strain: Not on file  Food Insecurity: Not on file  Transportation Needs: Not on file  Physical Activity: Not on file  Stress: Not on file  Social Connections: Not on file     Family History:  The patient's  family history includes Heart disease (age of onset: 37) in his father; Ovarian cancer in his mother.   ROS:   Please see the history of present illness.    ROS All other systems reviewed and are negative.   PHYSICAL EXAM:   VS:  BP 136/90   Pulse 63   Ht 6' (1.829 m)   Wt 271 lb 12.8 oz (123.3 kg)   SpO2 92%   BMI 36.86 kg/m   Physical Exam  GEN: Obese, in no acute distress  Neck: no JVD, carotid bruits, or masses Cardiac:RRR; 1/6 diastolic murmur at the left sternal border Respiratory: Decreased breath sounds but clear to auscultation bilaterally, normal work of breathing GI: soft, nontender, nondistended, + BS Ext: +1 edema on the right lower extremity trace on the left Neuro:  Alert and Oriented x 3,  Psych: euthymic mood, full affect  Wt Readings from Last 3 Encounters:  02/17/21 271 lb 12.8 oz (123.3 kg)  01/30/20 276 lb (125.2 kg)  07/20/19 262 lb (118.8 kg)      Studies/Labs Reviewed:   EKG:  EKG is  ordered today.  The ekg ordered  today demonstrates normal sinus rhythm with first-degree AV block, RBBB, LAFB, LVH, no acute change  Recent Labs: No results found for requested labs within last 8760 hours.   Lipid Panel    Component Value Date/Time   CHOL 110 05/10/2018 0409   TRIG 164 (H) 05/10/2018 0409   HDL 34 (L) 05/10/2018 0409   CHOLHDL 3.2 05/10/2018 0409   VLDL 33 05/10/2018 0409   LDLCALC 43 05/10/2018 0409    Additional studies/ records that were reviewed today include:   2D echo 02/14/2021 IMPRESSIONS     1. Left ventricular ejection fraction, by estimation, is 60 to 65%. The  left ventricle has normal function. The left ventricle has no regional  wall motion abnormalities. There is moderate left ventricular hypertrophy.  Left ventricular diastolic  parameters are consistent with Grade I diastolic dysfunction (impaired  relaxation).   2. Right ventricular systolic function is normal. The right ventricular  size is normal. Tricuspid regurgitation signal is inadequate for assessing  PA pressure.   3. The mitral valve is grossly normal. Trivial mitral valve  regurgitation.   4. The aortic valve has been repaired/replaced. Aortic valve  regurgitation is mild. There is a 25 mm human allograft aortic root graft  valve present in the aortic position.   5. Aortic dilatation noted. There is mild dilatation of the aortic root,  measuring 42 mm.   6. The inferior vena cava is normal in size with greater than 50%  respiratory variability, suggesting right atrial pressure of 3 mmHg.   Comparison(s): Prior images unable to be directly viewed, comparison made  by report only. Aortic valve allograft functioning normally with mild  aortic regurgitation.  Cardiac catheterization 10/07/2016: Prox Cx to Mid Cx lesion, 30 %stenosed. Mid LAD lesion, 20 %stenosed. Ost 2nd Diag lesion, 20 %stenosed. Dist LAD lesion, 40 %stenosed. Ost RCA lesion, 100 %stenosed. SVG graft was visualized by angiography and is very  large and anatomically normal.   1. 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 Patent SVG to PDA. The entire  RCA fills from the patent vein graft.  3. Mild disease in the Circumflex and LAD. No change since last cath.  4. Normal LVEDP   Echocardiogram 05/09/2018: - Left ventricle: Wall thickness was increased in a pattern of    moderate LVH. Systolic function was normal. The estimated    ejection fraction was 65%. Findings consistent with left    ventricular diastolic dysfunction, grade indeterminate. Doppler    parameters are consistent with high ventricular filling pressure.  - Regional wall motion abnormality: Mild hypokinesis of the basal    inferior myocardium.  - Ventricular septum: Septal motion showed abnormal function and    dyssynergy. These changes are consistent with intraventricular    conduction delay.  - Aortic valve: There was trivial regurgitation.  - Aorta: Aortic root dimension: 39 mm (ED).  - Mitral valve: Calcified annulus. Normal thickness leaflets.   Risk Assessment/Calculations:         ASSESSMENT:    1. Coronary artery disease involving native coronary artery of native heart without angina pectoris   2. S/P aortic valve replacement with allograft   3. Mixed hyperlipidemia   4. Essential hypertension   5. Class 2 severe obesity due to excess calories with serious comorbidity and body mass index (BMI) of 36.0 to 36.9 in adult [facility])      PLAN:  In order of problems listed above:  Dyspnea at rest while in the recliner now improved with diet, exercise and weight loss.  2D echo 02/14/2021 normal LVEF mild diastolic dysfunction with stable aortic valve allograft mild AI.  Continue weight loss and exercise.  2 g sodium diet.  PFTs last year also suggested emphysema.  Essential hypertension blood pressures been running high.  Just had labs at Dr. Juel Burrow office.  We will request a copy.  Start losartan 25 mg  once daily.  Repeat be met in 2 weeks.  Follow-up blood pressure in office in 6 weeks.  CAD  Status post CABG with SVG to PDA.  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  2D echo 02/14/2021 stable valve with normal LVEF   Mixed hyperlipidemia, he continues on Lipitor with follow-up by Dr. Nevada Crane  Obesity exercise and weight loss essential for overall health       Shared Decision Making/Informed Consent        Medication Adjustments/Labs and Tests Ordered: Current medicines are reviewed at length with the patient today.  Concerns regarding medicines are outlined above.  Medication changes, Labs and Tests ordered today are listed in the Patient Instructions below. Patient Instructions  Medication Instructions:  Your physician has recommended you make the following change in your medication:   START Losartan 25 mg taking 1 daily   *If you need a refill on your cardiac medications before your next appointment, please call your pharmacy*   Lab Work: 2 WEEKS:  COME TO THE LAB [place]:  BMET  If you have labs (blood work) drawn today and your tests are completely normal, you will receive your results only by: [place] (if you have MyChart) OR A paper copy in the mail If you have any lab test that is abnormal or we need to change your treatment, we will call you to review the results.   Testing/Procedures: None ordered   Follow-Up: At [facility], you and your health needs are our priority.  As part of our continuing mission to provide  you with exceptional heart care, we have created designated Provider Care Teams.  These Care Teams include your primary Cardiologist (physician) and Advanced Practice Providers (APPs -  Physician Assistants and Nurse Practitioners) who all work together to provide you with the care you need, when you need it.  We recommend signing up for the  patient portal called "MyChart".  Sign up information is provided on this After Visit Summary.  MyChart is used to connect with patients for Virtual Visits (Telemedicine).  Patients are able to view lab/test results, encounter notes, upcoming appointments, etc.  Non-urgent messages can be sent to your provider as well.   To learn more about what you can do with MyChart, go to [email or web address].    Your next appointment:   6 WEEKS   The format for your next appointment:   In Person  Provider:   You may see Rozann Lesches, MD or one of the following Advanced Practice Providers on your designated Care Team:   Ermalinda Barrios, PA-C     Other Instructions    Signed, Ermalinda Barrios, PA-C  02/17/2021 1:07 PM    [facility] [address], [place], [place]  [address] Phone: [phone]; Fax: [phone]

## 2021-02-14 ENCOUNTER — Ambulatory Visit (HOSPITAL_COMMUNITY)
Admission: RE | Admit: 2021-02-14 | Discharge: 2021-02-14 | Disposition: A | Discharge status: Home / Self Care | Payer: Medicare Other | Source: Ambulatory Visit | Attending: Cardiology | Admitting: Cardiology

## 2021-02-14 ENCOUNTER — Other Ambulatory Visit: Payer: Self-pay

## 2021-02-14 DIAGNOSIS — R0602 Shortness of breath: Secondary | ICD-10-CM

## 2021-02-14 LAB — ECHOCARDIOGRAM COMPLETE
Area-P 1/2: 2.83 cm2
S' Lateral: 3.7 cm

## 2021-02-14 NOTE — Progress Notes (Signed)
*  PRELIMINARY RESULTS* Echocardiogram 2D Echocardiogram has been performed.  Samuel Germany 02/14/2021, 10:17 AM

## 2021-02-17 ENCOUNTER — Other Ambulatory Visit: Payer: Self-pay

## 2021-02-17 ENCOUNTER — Encounter: Payer: Self-pay | Admitting: Physician Assistant

## 2021-02-17 ENCOUNTER — Other Ambulatory Visit: Payer: Self-pay | Admitting: *Deleted

## 2021-02-17 ENCOUNTER — Telehealth: Payer: Self-pay | Admitting: *Deleted

## 2021-02-17 ENCOUNTER — Ambulatory Visit: Payer: Medicare Other | Admitting: Physician Assistant

## 2021-02-17 VITALS — BP 136/90 | HR 63 | Ht 72.0 in | Wt 271.8 lb

## 2021-02-17 DIAGNOSIS — Z6836 Body mass index (BMI) 36.0-36.9, adult: Secondary | ICD-10-CM

## 2021-02-17 DIAGNOSIS — E782 Mixed hyperlipidemia: Secondary | ICD-10-CM

## 2021-02-17 DIAGNOSIS — Z954 Presence of other heart-valve replacement: Secondary | ICD-10-CM

## 2021-02-17 DIAGNOSIS — I2510 Atherosclerotic heart disease of native coronary artery without angina pectoris: Secondary | ICD-10-CM | POA: Diagnosis not present

## 2021-02-17 DIAGNOSIS — E66812 Obesity, class 2: Secondary | ICD-10-CM

## 2021-02-17 DIAGNOSIS — I10 Essential (primary) hypertension: Secondary | ICD-10-CM | POA: Diagnosis not present

## 2021-02-17 MED ORDER — LOSARTAN POTASSIUM 25 MG PO TABS: 25.0000 mg | ORAL_TABLET | Freq: Every day | ORAL | 3 refills | Status: DC

## 2021-02-17 NOTE — Patient Instructions (Addendum)
Medication Instructions:  Your physician has recommended you make the following change in your medication:   START Losartan 25 mg taking 1 daily   *If you need a refill on your cardiac medications before your next appointment, please call your pharmacy*   Lab Work: 2 WEEKS:  COME TO THE LAB [place]:  BMET  If you have labs (blood work) drawn today and your tests are completely normal, you will receive your results only by: [place] (if you have MyChart) OR A paper copy in the mail If you have any lab test that is abnormal or we need to change your treatment, we will call you to review the results.   Testing/Procedures: None ordered   Follow-Up: At [facility], you and your health needs are our priority.  As part of our continuing mission to provide you with exceptional heart care, we have created designated Provider Care Teams.  These Care Teams include your primary Cardiologist (physician) and Advanced Practice Providers (APPs -  Physician Assistants and Nurse Practitioners) who all work together to provide you with the care you need, when you need it.  We recommend signing up for the patient portal called "MyChart".  Sign up information is provided on this After Visit Summary.  MyChart is used to connect with patients for Virtual Visits (Telemedicine).  Patients are able to view lab/test results, encounter notes, upcoming appointments, etc.  Non-urgent messages can be sent to your provider as well.   To learn more about what you can do with MyChart, go to [email or web address].    Your next appointment:   6 WEEKS   The format for your next appointment:   In Person  Provider:   You may see Rozann Lesches, MD or one of the following Advanced Practice Providers on your designated Care Team:   Ermalinda Barrios, PA-C     Other Instructions

## 2021-02-17 NOTE — Telephone Encounter (Signed)
Pt's last CR, dated 10/21/2020, per Dr. Juel Burrow office was 1.11  They are faxing his labs over.

## 2021-03-03 ENCOUNTER — Telehealth: Payer: Self-pay | Admitting: Cardiology

## 2021-03-03 ENCOUNTER — Other Ambulatory Visit: Payer: Self-pay | Admitting: Cardiology

## 2021-03-03 NOTE — Telephone Encounter (Signed)
Refilled by e-scribe already today

## 2021-03-03 NOTE — Telephone Encounter (Signed)
*  STAT* If patient is at the pharmacy, call can be transferred to refill team.   1. Which medications need to be refilled? (please list name of each medication and dose if known) potassium chloride (KLOR-CON) 10 MEQ tablet  2. Which pharmacy/location (including street and city if local pharmacy) is medication to be sent to? [id number] - [place], [address]  3. Do they need a 30 day or 90 day supply? 90 day   Patient states he is out of medication and has been eating bananas.

## 2021-03-05 ENCOUNTER — Ambulatory Visit: Payer: Medicare Other | Admitting: Cardiology

## 2021-03-10 ENCOUNTER — Other Ambulatory Visit: Payer: Self-pay | Admitting: Cardiology

## 2021-03-12 DIAGNOSIS — I10 Essential (primary) hypertension: Secondary | ICD-10-CM | POA: Diagnosis not present

## 2021-03-12 DIAGNOSIS — E782 Mixed hyperlipidemia: Secondary | ICD-10-CM | POA: Diagnosis not present

## 2021-04-02 ENCOUNTER — Ambulatory Visit: Payer: Medicare Other | Admitting: Student

## 2021-04-03 NOTE — Progress Notes (Addendum)
Cardiology Office Note    Date:  04/04/2021   ID:  Charles [name], DOB 06-Mar-1946, MRN [id number]  PCP:  [name], MD  Cardiologist: Rozann Lesches, MD    Chief Complaint  Patient presents with   Follow-up    6 week visit    History of Present Illness:    Charles [name] is a 75 y.o. 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  He was last examined by Ermalinda Barrios, PA-C on 02/17/2021 and had called the office in 01/2021 reporting worsening dyspnea. He reported having started dieting and exercising more routinely and had lost 10 pounds with reported improvement in his dyspnea. Recent echocardiogram had shown a preserved EF of 60 to 65% with no regional wall motion abnormalities. He did have moderate LVH, Grade 1 DD, trivial MR and mild dilation of the aortic root measuring 42 mm. His BP was elevated and he was started on Losartan 25 mg daily.   In talking with the patient today, he reports overall feeling well from a cardiac perspective since his last office visit. He denies any recent chest pain or dyspnea on exertion. No recent orthopnea, PND or pitting edema. Has been monitoring his sodium intake very closely. He reports good compliance with Losartan and denies any noted side effects with this.  His blood pressure has overall been well controlled when checked at home but he has not checked this recently due to moving next month.  Past Medical History:  Diagnosis Date   Anemia    Aortic insufficiency    a. 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, and replacement of ascending thoracic aortic aneurysm   S/P CABG x 1 10/02/2016   SVG to RCA with EVH via right thigh   S/P patent foramen ovale closure 10/02/2016    Past Surgical History:  Procedure Laterality Date   AORTIC VALVE REPLACEMENT N/A 10/02/2016   Procedure: AORTIC ROOT REPLACEMENT  - using 88mm Allograft;  Surgeon: [name], MD;  Location: [place];  Service: Open Heart Surgery;  Laterality: N/A;   COLONOSCOPY N/A 12/22/2017   Procedure: COLONOSCOPY;  Surgeon: Daneil Dolin, MD;  [facility]: [facility];  Service: Endoscopy;  Laterality: N/A;  2:00pm - refused to move up   [place] N/A 10/02/2016   Procedure: CORONARY ARTERY BYPASS GRAFTING (CABG) x one, using right leg greater saphenous vein harvested endoscopically;  Surgeon: [name], MD;  Location: [place];  Service: Open Heart Surgery;  Laterality: N/A;   FALSE ANEURYSM REPAIR  10/02/2016   Procedure: REPAIR FALSE ANEURYSM of Aortic Root Resection and Grafting of Aortic Root;  Surgeon: [name], MD;  Location: [place];  Service: Open Heart Surgery;;   HERNIA REPAIR Left    LEFT HEART  CATH AND CORS/GRAFTS ANGIOGRAPHY N/A 10/07/2016   Procedure: Left Heart Cath and Cors/Grafts Angiography;  Surgeon: Burnell Blanks, MD;  Location: [place] [facility];  Service: Cardiovascular;  Laterality: N/A;   MULTIPLE EXTRACTIONS WITH ALVEOLOPLASTY N/A 08/13/2016   Procedure: Extraction of tooth #'s 2,4,14 with alveoloplasty;  Surgeon: Lenn Cal, DDS;  Location: [place];  Service: Oral Surgery;  Laterality: N/A;   PATENT FORAMEN OVALE(PFO) CLOSURE N/A 10/02/2016   Procedure: PATENT FORAMEN OVALE (PFO) CLOSURE;  Surgeon: [name], MD;  Location: [place];   Service: Open Heart Surgery;  Laterality: N/A;   RIGHT/LEFT HEART CATH AND CORONARY ANGIOGRAPHY N/A 08/03/2016   Procedure: Right/Left Heart Cath and Coronary Angiography;  Surgeon: Burnell Blanks, MD;  Location: [facility];  Service: Cardiovascular;  Laterality: N/A;   TEE WITHOUT CARDIOVERSION N/A 07/07/2016   Procedure: TRANSESOPHAGEAL ECHOCARDIOGRAM (TEE);  Surgeon: Larey Dresser, MD;  Location: [place];  Service: Cardiovascular;  Laterality: N/A;   TEE WITHOUT CARDIOVERSION N/A 10/02/2016   Procedure: TRANSESOPHAGEAL ECHOCARDIOGRAM (TEE);  Surgeon: [name], MD;  Location: [place];  Service: Open Heart Surgery;  Laterality: N/A;   THORACIC AORTIC ANEURYSM REPAIR N/A 10/02/2016   Procedure: THORACIC ASCENDING ANEURYSM REPAIR  - using 70mm Allograft;  Reimplantation of Left Coronary Button;  Surgeon: [name], MD;  Location: [place];  Service: Open Heart Surgery;  Laterality: N/A;    Current Medications: Outpatient Medications Prior to Visit  Medication Sig Dispense Refill   amoxicillin (AMOXIL) 500 MG capsule TAKE 4 CAPSULES BY MOUTH 1 HOUR BEFORE PROCEDURE 12 capsule 0   aspirin EC 81 MG tablet Take 81 mg by mouth daily.     atorvastatin (LIPITOR) 40 MG tablet TAKE 1 TABLET(40 MG) BY MOUTH DAILY 90 tablet 3   fluticasone (FLONASE) 50 MCG/ACT nasal spray PLACE 2 SPRAYS INTO EACH NOSTRIL EVERY NIGHT AT BEDTIME 48 g 6   furosemide (LASIX) 20 MG tablet TAKE 1 TABLET(20 MG) BY MOUTH DAILY 90 tablet 3   losartan (COZAAR) 25 MG tablet Take 1 tablet (25 mg total) by mouth daily. 90 tablet 3   metoprolol tartrate (LOPRESSOR) 25 MG tablet TAKE 1 TABLET BY MOUTH TWICE DAILY. HOLD FOR HEART RATE LESS THAN 50 180 tablet 3   Multiple Vitamin (MULTIVITAMIN WITH MINERALS) TABS tablet Take 1 tablet by mouth daily.     nitroGLYCERIN (NITROSTAT) 0.4 MG SL tablet Place 1 tablet (0.4 mg total) under the tongue every 5 (five) minutes x 3 doses as needed for chest pain (if no  relief after 3rd dose, proceed to the ED for an evaluation). 25 tablet 3   potassium chloride (KLOR-CON) 10 MEQ tablet TAKE 1 TABLET(10 MEQ) BY MOUTH DAILY 30 tablet 3   No facility-administered medications prior to visit.     Allergies:   Lisinopril   Social History   Socioeconomic History   Marital status: Widowed    Spouse name: Not on file   Number of children: 3   Years of education: Not on file   Highest education level: Not on file  Occupational History   Not on file  Tobacco Use   Smoking status: Former    Types: Cigarettes    Quit date: 05/01/1991    Years since quitting: 29.9   Smokeless tobacco: Never   Tobacco comments:    Quit 30 years ago.    Vaping Use   Vaping Use: Never used  Substance and Sexual Activity  Alcohol use: Yes    Alcohol/week: 7.0 standard drinks    Types: 7 Glasses of wine per week   Drug use: No   Sexual activity: Not on file  Other Topics Concern   Not on file  Social History Narrative   Not on file   Social Determinants of Health   Financial Resource Strain: Not on file  Food Insecurity: Not on file  Transportation Needs: Not on file  Physical Activity: Not on file  Stress: Not on file  Social Connections: Not on file     Family History:  The patient's family history includes Heart disease (age of onset: 92) in his father; Ovarian cancer in his mother.   Review of Systems:    Please see the history of present illness.     All other systems reviewed and are otherwise negative except as noted above.   Physical Exam:    VS:  BP 136/82    Pulse (!) 56    Ht 6' (1.829 m)    Wt 272 lb 3.2 oz (123.5 kg)    SpO2 96%    BMI 36.92 kg/m    General: Well developed, well nourished,male appearing in no acute distress. Head: Normocephalic, atraumatic. Neck: No carotid bruits. JVD not elevated.  Lungs: Respirations regular and unlabored, without wheezes or rales.  Heart: Regular rate and rhythm. No S3 or S4.  No murmur, no rubs, or  gallops appreciated. Abdomen: Appears non-distended. No obvious abdominal masses. Msk:  Strength and tone appear normal for age. No obvious joint deformities or effusions. Extremities: No clubbing or cyanosis. No pitting edema.  Distal pedal pulses are 2+ bilaterally. Neuro: Alert and oriented X 3. Moves all extremities spontaneously. No focal deficits noted. Psych:  Responds to questions appropriately with a normal affect. Skin: No rashes or lesions noted  Wt Readings from Last 3 Encounters:  04/04/21 272 lb 3.2 oz (123.5 kg)  02/17/21 271 lb 12.8 oz (123.3 kg)  01/30/20 276 lb (125.2 kg)     Studies/Labs Reviewed:   EKG:  EKG is not ordered today.   Recent Labs: No results found for requested labs within last 8760 hours.   Lipid Panel    Component Value Date/Time   CHOL 110 05/10/2018 0409   TRIG 164 (H) 05/10/2018 0409   HDL 34 (L) 05/10/2018 0409   CHOLHDL 3.2 05/10/2018 0409   VLDL 33 05/10/2018 0409   LDLCALC 43 05/10/2018 0409    Additional studies/ records that were reviewed today include:   LHC: 09/2016 Prox Cx to Mid Cx lesion, 30 %stenosed. Mid LAD lesion, 20 %stenosed. Ost 2nd Diag lesion, 20 %stenosed. Dist LAD lesion, 40 %stenosed. Ost RCA lesion, 100 %stenosed. SVG graft was visualized by angiography and is very large and anatomically normal.   1. 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 Patent SVG to PDA. The entire RCA fills from the patent vein graft.  3. Mild disease in the Circumflex and LAD. No change since last cath.  4. Normal LVEDP   Recommendations: No further ischemic workup  Echocardiogram: 02/2021 IMPRESSIONS     1. Left ventricular ejection fraction, by estimation, is 60 to 65%. The  left ventricle has normal function. The left ventricle has no regional  wall motion abnormalities. There is moderate left ventricular hypertrophy.  Left ventricular diastolic  parameters are consistent  with Grade I diastolic dysfunction (impaired  relaxation).   2. Right ventricular systolic function is  normal. The right ventricular  size is normal. Tricuspid regurgitation signal is inadequate for assessing  PA pressure.   3. The mitral valve is grossly normal. Trivial mitral valve  regurgitation.   4. The aortic valve has been repaired/replaced. Aortic valve  regurgitation is mild. There is a 25 mm human allograft aortic root graft  valve present in the aortic position.   5. Aortic dilatation noted. There is mild dilatation of the aortic root,  measuring 42 mm.   6. The inferior vena cava is normal in size with greater than 50%  respiratory variability, suggesting right atrial pressure of 3 mmHg.    Assessment:    1. S/P aortic valve replacement with allograft   2. Coronary artery disease involving native coronary artery of native heart without angina pectoris   3. Essential hypertension   4. Hyperlipidemia LDL goal <70      Plan:   In order of problems listed above:  1. Severe AI - He is s/p aortic root replacement and allograft and closure of PFO in 09/2016. Recent echocardiogram in 02/2021 showed his aortic valve was functioning normally with only mild AI and he did have mild dilation of the aortic root at 42 mm. Will continue to follow.   2. CAD - He is s/p SVG-RCA in 09/2016.  He denies any recent anginal symptoms. Remains on ASA 81 mg daily, Atorvastatin 40 mg daily and Lopressor 25 mg twice daily.  3. HTN - His blood pressure is at 136/82 during today's visit. I encouraged him to keep track of his readings at home and report back if this remains elevated. Will continue with his current regimen of Losartan 25 mg daily and Lopressor 25 mg twice daily. Will request most recent BMET from his PCP as he reports this was checked since starting Losartan. He did express interest in switching to Toprol-XL for once daily dosing but wishes to hold off on this for now.  4. HLD -  Followed by his PCP. His LDL was at 80 in 10/2020 but he reports having repeat labs within the past month. Will request a copy. He remains on Atorvastatin 40 mg daily.   Medication Adjustments/Labs and Tests Ordered: Current medicines are reviewed at length with the patient today.  Concerns regarding medicines are outlined above.  Medication changes, Labs and Tests ordered today are listed in the Patient Instructions below. Patient Instructions  Medication Instructions:  Your physician recommends that you continue on your current medications as directed. Please refer to the Current Medication list given to you today.   Labwork: None today   Testing/Procedures: None today  Follow-Up: 6 months  Any Other Special Instructions Will Be Listed Below (If Applicable).  If you need a refill on your cardiac medications before your next appointment, please call your pharmacy.    Signed, Erma Heritage, PA-C  04/04/2021 4:59 PM    [facility] [address] [place], [place] [address] Phone: [phone] Fax: [phone]

## 2021-04-04 ENCOUNTER — Ambulatory Visit: Payer: Medicare Other | Admitting: Student

## 2021-04-04 ENCOUNTER — Encounter: Payer: Self-pay | Admitting: Student

## 2021-04-04 ENCOUNTER — Other Ambulatory Visit: Payer: Self-pay

## 2021-04-04 VITALS — BP 136/82 | HR 56 | Ht 72.0 in | Wt 272.2 lb

## 2021-04-04 DIAGNOSIS — Z954 Presence of other heart-valve replacement: Secondary | ICD-10-CM

## 2021-04-04 DIAGNOSIS — E785 Hyperlipidemia, unspecified: Secondary | ICD-10-CM

## 2021-04-04 DIAGNOSIS — I10 Essential (primary) hypertension: Secondary | ICD-10-CM | POA: Diagnosis not present

## 2021-04-04 DIAGNOSIS — I2510 Atherosclerotic heart disease of native coronary artery without angina pectoris: Secondary | ICD-10-CM | POA: Diagnosis not present

## 2021-04-04 NOTE — Patient Instructions (Signed)
Medication Instructions:  Your physician recommends that you continue on your current medications as directed. Please refer to the Current Medication list given to you today.   Labwork: None today  Testing/Procedures: None today  Follow-Up: 6 months  Any Other Special Instructions Will Be Listed Below (If Applicable).  If you need a refill on your cardiac medications before your next appointment, please call your pharmacy.  

## 2021-05-06 DIAGNOSIS — R7303 Prediabetes: Secondary | ICD-10-CM | POA: Diagnosis not present

## 2021-05-06 DIAGNOSIS — E782 Mixed hyperlipidemia: Secondary | ICD-10-CM | POA: Diagnosis not present

## 2021-05-06 DIAGNOSIS — D509 Iron deficiency anemia, unspecified: Secondary | ICD-10-CM | POA: Diagnosis not present

## 2021-05-08 DIAGNOSIS — R944 Abnormal results of kidney function studies: Secondary | ICD-10-CM | POA: Diagnosis not present

## 2021-05-08 DIAGNOSIS — R7303 Prediabetes: Secondary | ICD-10-CM | POA: Diagnosis not present

## 2021-05-08 DIAGNOSIS — E782 Mixed hyperlipidemia: Secondary | ICD-10-CM | POA: Diagnosis not present

## 2021-05-08 DIAGNOSIS — D509 Iron deficiency anemia, unspecified: Secondary | ICD-10-CM | POA: Diagnosis not present

## 2021-05-29 ENCOUNTER — Other Ambulatory Visit: Payer: Self-pay | Admitting: Cardiology

## 2021-06-03 ENCOUNTER — Telehealth: Payer: Self-pay | Admitting: Cardiology

## 2021-06-03 MED ORDER — NITROGLYCERIN 0.4 MG SL SUBL: 0.4000 mg | SUBLINGUAL_TABLET | SUBLINGUAL | 3 refills | Status: DC | PRN

## 2021-06-03 NOTE — Telephone Encounter (Signed)
°*  STAT* If patient is at the pharmacy, call can be transferred to refill team.   1. Which medications need to be refilled? (please list name of each medication and dose if known) nitroGLYCERIN (NITROSTAT) 0.4 MG SL tablet  2. Which pharmacy/location (including street and city if local pharmacy) is medication to be sent to? [id number] - [place], [place] - [address] [place] [address]  3. Do they need a 30 day or 90 day supply? 30 day

## 2021-06-03 NOTE — Telephone Encounter (Signed)
Refill complete 

## 2021-06-16 ENCOUNTER — Telehealth: Payer: Self-pay | Admitting: Cardiology

## 2021-06-16 MED ORDER — POTASSIUM CHLORIDE ER 10 MEQ PO TBCR: EXTENDED_RELEASE_TABLET | ORAL | 3 refills | Status: DC

## 2021-06-16 NOTE — Telephone Encounter (Signed)
Complete

## 2021-06-16 NOTE — Telephone Encounter (Signed)
?*  STAT* If patient is at the pharmacy, call can be transferred to refill team. ? ? ?1. Which medications need to be refilled? (please list name of each medication and dose if known) potassium chloride (KLOR-CON) 10 MEQ tablet ? ?2. Which pharmacy/location (including street and city if local pharmacy) is medication to be sent to? [id number] - [place], [place] - [address]  ? ?3. Do they need a 30 day or 90 day supply? 90 day ? ?NEW PHARMACY  ?

## 2021-07-06 ENCOUNTER — Other Ambulatory Visit: Payer: Self-pay | Admitting: Cardiology

## 2021-07-07 ENCOUNTER — Telehealth: Payer: Self-pay | Admitting: Cardiology

## 2021-07-07 MED ORDER — METOPROLOL SUCCINATE ER 50 MG PO TB24: 50.0000 mg | ORAL_TABLET | Freq: Every day | ORAL | 3 refills | Status: DC

## 2021-07-07 NOTE — Telephone Encounter (Signed)
I spoke with patient and he agrees to switch from lopressor to toprol xl 50 mg qd. He wil monitor bp and update [place]. ?

## 2021-07-07 NOTE — Telephone Encounter (Signed)
Pt c/o medication issue: ? ?1. Name of Medication: metoprolol tartrate (LOPRESSOR) 25 MG tablet ? ?2. How are you currently taking this medication (dosage and times per day)? TAKE 1 TABLET BY MOUTH TWICE DAILY. HOLD FOR YOUR HEART RATE IS LESS THAN 50 ? ?3. Are you having a reaction (difficulty breathing--STAT)? No ? ?4. What is your medication issue? Pt wants to see if he can go from taking 1 tablet  twice daily to 1 tablet once daily. ?

## 2021-07-07 NOTE — Telephone Encounter (Signed)
? ?  We can switch from Lopressor '25mg'$  BID to Toprol-XL '50mg'$  daily. Would encourage him to check BP once on Toprol-XL and if this remains above goal, we can further titrate Losartan.  ? ?Signed, ?Erma Heritage, PA-C ?07/07/2021, 2:12 PM ? ?

## 2021-07-07 NOTE — Telephone Encounter (Signed)
Patient states he forgets to take his pm dose of lopressor 2-3 times a week and wants to go on toprol xl. ? ? ?He also reports his systolic bp has been in the 140's with HR 60's.He was not at home so he could not give me any more bp readings.I explained that if he is missing lopressor doses it could account for elevated bp readings.He feels his losartan dose should increase. ? ? ? ? ?I will forward to B.Strader, PA-C for dispo. ?

## 2021-07-11 ENCOUNTER — Encounter: Payer: Self-pay | Admitting: Cardiology

## 2021-07-11 ENCOUNTER — Other Ambulatory Visit: Payer: Self-pay

## 2021-07-11 ENCOUNTER — Telehealth: Payer: Self-pay | Admitting: Cardiology

## 2021-07-11 DIAGNOSIS — I10 Essential (primary) hypertension: Secondary | ICD-10-CM

## 2021-07-11 MED ORDER — LOSARTAN POTASSIUM 50 MG PO TABS: 50.0000 mg | ORAL_TABLET | Freq: Every day | ORAL | 3 refills | Status: DC

## 2021-07-11 NOTE — Telephone Encounter (Signed)
Patient wanted to make sure that his MyChart message got to the Nurse. He sent some BP readings and wanted to make sure they were received  ?

## 2021-07-11 NOTE — Telephone Encounter (Signed)
BP readings were routed to provider.  ?

## 2021-07-14 ENCOUNTER — Telehealth: Payer: Self-pay | Admitting: Cardiology

## 2021-07-14 NOTE — Telephone Encounter (Signed)
Spoke with Pt who states that his BP has been elevated. Current BP is 178/101, HR 62. Pt states that his Losartan was increased to 50 mg last week. Pt states that has been taking 25 mg BID. Encouraged pt to take the total 50 mg in the morning and check BP 2 hours after. Pt agrees with the plan. Please advise.  ?

## 2021-07-14 NOTE — Telephone Encounter (Signed)
Pt c/o BP issue: STAT if pt c/o blurred vision, one-sided weakness or slurred speech ? ?1. What are your last 5 BP readings? Patient did not provide them. He said they were sent through [facility]  ? ?2. Are you having any other symptoms (ex. Dizziness, headache, blurred vision, passed out)? no ? ?3. What is your BP issue? Patient is concerned because his BP is still elevated. He wants to talk to the RN  ?

## 2021-07-14 NOTE — Telephone Encounter (Signed)
Pt will monitor BP's this week and update [place] on Friday with results.  ?

## 2021-07-14 NOTE — Telephone Encounter (Signed)
Patient called again to check on status.  ?

## 2021-07-28 ENCOUNTER — Encounter: Payer: Self-pay | Admitting: Cardiology

## 2021-07-29 ENCOUNTER — Other Ambulatory Visit: Payer: Self-pay

## 2021-07-29 DIAGNOSIS — I10 Essential (primary) hypertension: Secondary | ICD-10-CM

## 2021-07-30 ENCOUNTER — Other Ambulatory Visit (HOSPITAL_COMMUNITY)
Admission: RE | Admit: 2021-07-30 | Discharge: 2021-07-30 | Disposition: A | Discharge status: Home / Self Care | Payer: Medicare Other | Source: Ambulatory Visit | Attending: Cardiology | Admitting: Cardiology

## 2021-07-30 DIAGNOSIS — I10 Essential (primary) hypertension: Secondary | ICD-10-CM | POA: Diagnosis not present

## 2021-07-30 LAB — BASIC METABOLIC PANEL
Anion gap: 6 (ref 5–15)
BUN: 26 mg/dL — ABNORMAL HIGH (ref 8–23)
CO2: 26 mmol/L (ref 22–32)
Calcium: 8.6 mg/dL — ABNORMAL LOW (ref 8.9–10.3)
Chloride: 108 mmol/L (ref 98–111)
Creatinine, Ser: 1.11 mg/dL (ref 0.61–1.24)
GFR, Estimated: >60 mL/min (ref >60)
Glucose, Bld: 145 mg/dL — ABNORMAL HIGH (ref 70–99)
Potassium: 3.8 mmol/L (ref 3.5–5.1)
Sodium: 140 mmol/L (ref 135–145)

## 2021-08-01 ENCOUNTER — Telehealth: Payer: Self-pay | Admitting: Cardiology

## 2021-08-01 MED ORDER — LOSARTAN POTASSIUM 100 MG PO TABS: 100.0000 mg | ORAL_TABLET | Freq: Every day | ORAL | 3 refills | Status: DC

## 2021-08-01 NOTE — Telephone Encounter (Signed)
New rx e-scribed to [facility]. I sent patient a MyChart message with lab results and instructions from [place] ?

## 2021-08-01 NOTE — Telephone Encounter (Signed)
° °  Pt is calling to get lab result °

## 2021-08-01 NOTE — Telephone Encounter (Signed)
Satira Sark, MD  ?07/30/2021  1:06 PM EDT Back to Top  ?  ?Results reviewed.  Renal function and potassium are stable.  Suggest increasing Cozaar to 100 mg daily.  ? ?

## 2021-08-08 ENCOUNTER — Telehealth: Payer: Self-pay | Admitting: Cardiology

## 2021-08-08 ENCOUNTER — Encounter: Payer: Self-pay | Admitting: Cardiology

## 2021-08-08 NOTE — Telephone Encounter (Signed)
Dr. Domenic Polite had suggested patient be seen earlier because of high BP. Called patient to offer him an appt on a day Dr. Domenic Polite is covering [facility]. Patient stated to me that he thinks he made a mistake. He was prescribed  '100mg'$  of Cozaar. Patient has been taken '50mg'$  for a week now, he wants to wait a week and, see what his reading are while taken the '100mg'$  like he was prescribed. He will send in updated readings next Friday 08/15/2021.  ? ?

## 2021-08-12 ENCOUNTER — Telehealth: Payer: Self-pay | Admitting: Cardiology

## 2021-08-12 MED ORDER — NITROGLYCERIN 0.4 MG SL SUBL: 0.4000 mg | SUBLINGUAL_TABLET | SUBLINGUAL | 3 refills | Status: AC | PRN

## 2021-08-12 NOTE — Telephone Encounter (Signed)
Pt states that earlier today he started having chest pain. It lasted 15-20 mins. Denies N/V sweating , SOB. The pain was located on the right side for the first episode and in the center with the second episode.Denies that the pain moved anywhere. States that he did not take nitro for this pain b/c he did not have any. Pt reports that BP today was 145/70 Hr of 58 and on yesterday BP was 130//62 HR 64. Pt encouraged to be seen in ER for reoccurring chest pain. New Rx sent into for Nitro. Please advise.     ?

## 2021-08-12 NOTE — Telephone Encounter (Signed)
Pt notified of Dr. Myles Gip note and the need to be seen for new onset reoccurring chest pain.  ?

## 2021-08-12 NOTE — Telephone Encounter (Signed)
Pt c/o of Chest Pain: STAT if CP now or developed within 24 hours ? ?1. Are you having CP right now? No  ? ?2. Are you experiencing any other symptoms (ex. SOB, nausea, vomiting, sweating)? no ?3. How long have you been experiencing CP? Started today ? ?4. Is your CP continuous or coming and going? Came and went ? ?5. Have you taken Nitroglycerin? No. Patient does not have any  ??  ?

## 2021-08-13 NOTE — Telephone Encounter (Signed)
Pt accepted appt with C. Kathlen Mody, PA-C on 5/5 @ 8:25 am. Pt aware of address and time.  ?

## 2021-08-15 ENCOUNTER — Ambulatory Visit: Payer: Medicare Other | Admitting: Medical

## 2021-08-15 ENCOUNTER — Encounter: Payer: Self-pay | Admitting: Medical

## 2021-08-15 VITALS — BP 154/86 | HR 55 | Ht 72.0 in | Wt 267.0 lb

## 2021-08-15 DIAGNOSIS — I2510 Atherosclerotic heart disease of native coronary artery without angina pectoris: Secondary | ICD-10-CM

## 2021-08-15 DIAGNOSIS — Z954 Presence of other heart-valve replacement: Secondary | ICD-10-CM

## 2021-08-15 DIAGNOSIS — R079 Chest pain, unspecified: Secondary | ICD-10-CM | POA: Diagnosis not present

## 2021-08-15 DIAGNOSIS — I10 Essential (primary) hypertension: Secondary | ICD-10-CM

## 2021-08-15 DIAGNOSIS — E782 Mixed hyperlipidemia: Secondary | ICD-10-CM

## 2021-08-15 DIAGNOSIS — Z6836 Body mass index (BMI) 36.0-36.9, adult: Secondary | ICD-10-CM

## 2021-08-15 MED ORDER — ISOSORBIDE MONONITRATE ER 30 MG PO TB24: 30.0000 mg | ORAL_TABLET | Freq: Every day | ORAL | 5 refills | Status: DC

## 2021-08-15 NOTE — Progress Notes (Signed)
?Cardiology Office Note:   ? ?Date:  08/15/2021  ? ?IDCHAIM [name], DOB Feb 18, 1946, MRN [id number] ? ?PCP:  [name], MD  ?[facility] Cardiologist:  Rozann Lesches, MD  ?[facility] Electrophysiologist:  None  ? ?Referring MD: [name], MD  ? ?Chief Complaint: Chest pain ? ?History of Present Illness:   ? ?Charles [name] is a 76 y.o. male with a hx of with past medical history of severe aortic regurgitation and bacterial endocarditis (s/p aortic root replacement and allograft and closure of PFO in 09/2016), CAD (s/p SVG-RCA in 09/2016), HTN and HLD who presents to the office today for 6-week follow-up. ?  ?He was examined by Ermalinda Barrios, PA-C on 02/17/2021 and had called the office in 01/2021 reporting worsening dyspnea. He reported having started dieting and exercising more routinely and had lost 10 pounds with reported improvement in his dyspnea. echocardiogram showed preserved EF of 60 to 65% with no regional wall motion abnormalities. He did have moderate LVH, Grade 1 DD, trivial MR and mild dilation of the aortic root measuring 42 mm. His BP was elevated and he was started on Losartan 25 mg daily.  ? ?Last seen 03/2021 and was overall feeling well from a cardiac perspective. No changes were made.  ? ?Today, the patient reports an episode of sharp chest pain. It occurred  a few days ago at rest. It was right sided and radiated into the middle of the chest. He had no associated symptoms. Did not check his vitals at the time. Pain lasted for 2-3 minutes. He did not take NTG. He denies recurrent chest pain. He notes persistently elevated blood pressures. Log shows SBP 130-180s/70-80s. He is on Toprol and Losartan. NO recent LLE, orthopnea, pnd or palpitations.  ?  ? ?Past Medical History:  ?Diagnosis Date  ? Anemia   ? Aortic insufficiency   ? a. 07/07/2016: TEE showing bicuspid aortic valve with severe eccentric AI   ? Ascending aortic aneurysm ([place])   ? 4.4 cm by CT 06/2016  ? 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 Bicuspid aortic valve   ? Chronic periodontitis   ? Coronary artery disease   ? Pneumonia 1978  ? 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 S/P CABG x 1 10/02/2016  ? SVG to RCA with EVH via right thigh  ? S/P patent foramen ovale closure 10/02/2016  ? ? ?Past Surgical History:  ?Procedure Laterality Date  ? AORTIC VALVE REPLACEMENT N/A 10/02/2016  ? Procedure: AORTIC ROOT REPLACEMENT  - using 14m Allograft;  Surgeon: ORexene Alberts MD;  [place]  Service: Open Heart Surgery;  Laterality: N/A;  ? COLONOSCOPY N/A 12/22/2017  ? Procedure: COLONOSCOPY;  Surgeon: RDaneil Dolin MD;  [facility];  Service: Endoscopy;  Laterality: N/A;  2:00pm - refused to move up  ? CORONARY ARTERY BYPASS GRAFT N/A 10/02/2016  ? Procedure: CORONARY ARTERY BYPASS GRAFTING (CABG) x one, using right leg greater saphenous vein harvested endoscopically;  Surgeon: ORexene Alberts MD;  [place]: [place]  Service: Open Heart Surgery;  Laterality: N/A;  ? FALSE ANEURYSM REPAIR  10/02/2016  ? Procedure: REPAIR FALSE ANEURYSM of Aortic Root Resection and Grafting of Aortic Root;  Surgeon:  [name], MD;  Location: [facility] OR;  Service: Open Heart Surgery;;  ? HERNIA REPAIR Left   ? LEFT HEART CATH AND CORS/GRAFTS ANGIOGRAPHY N/A 10/07/2016  ? Procedure: Left Heart Cath and Cors/Grafts Angiography;  Surgeon: Burnell Blanks, MD;  Location: [place] CV LAB;  Service: Cardiovascular;  Laterality: N/A;  ? MULTIPLE EXTRACTIONS WITH ALVEOLOPLASTY N/A 08/13/2016  ? Procedure: Extraction of tooth #'s 2,4,14 with alveoloplasty;  Surgeon: Lenn Cal, DDS;  Location: [place];  Service: Oral Surgery;  Laterality: N/A;  ? PATENT FORAMEN OVALE(PFO)  CLOSURE N/A 10/02/2016  ? Procedure: PATENT FORAMEN OVALE (PFO) CLOSURE;  Surgeon: [name], MD;  Location: [place];  Service: Open Heart Surgery;  Laterality: N/A;  ? RIGHT/LEFT HEART CATH AND CORONARY ANGIOGRAPHY N/A 08/03/2016  ? Procedure: Right/Left Heart Cath and Coronary Angiography;  Surgeon: Burnell Blanks, MD;  Location: [place] CV LAB;  Service: Cardiovascular;  Laterality: N/A;  ? TEE WITHOUT CARDIOVERSION N/A 07/07/2016  ? Procedure: TRANSESOPHAGEAL ECHOCARDIOGRAM (TEE);  Surgeon: Larey Dresser, MD;  Location: [place];  Service: Cardiovascular;  Laterality: N/A;  ? TEE WITHOUT CARDIOVERSION N/A 10/02/2016  ? Procedure: TRANSESOPHAGEAL ECHOCARDIOGRAM (TEE);  Surgeon: [name], MD;  Location: [place];  Service: Open Heart Surgery;  Laterality: N/A;  ? THORACIC AORTIC ANEURYSM REPAIR N/A 10/02/2016  ? Procedure: THORACIC ASCENDING ANEURYSM REPAIR  - using 54m Allograft;  Reimplantation of Left Coronary Button;  Surgeon: ORexene Alberts MD;  [place]  Service: Open Heart Surgery;  Laterality: N/A;  ? ? ?Current Medications: ?Current Meds  ?Medication Sig  ? amoxicillin (AMOXIL) 500 MG capsule TAKE 4 CAPSULES BY MOUTH 1 HOUR BEFORE PROCEDURE  ? aspirin EC 81 MG tablet Take 81 mg by mouth daily.  ? atorvastatin (LIPITOR) 40 MG tablet TAKE 1 TABLET(40 MG) BY MOUTH DAILY  ? fluticasone (FLONASE) 50 MCG/ACT nasal spray PLACE 2 SPRAYS INTO EACH NOSTRIL EVERY NIGHT AT BEDTIME  ? furosemide (LASIX) 20 MG tablet TAKE 1 TABLET(20 MG) BY MOUTH DAILY  ? isosorbide mononitrate (IMDUR) 30 MG 24 hr tablet Take 1 tablet (30 mg total) by mouth daily.  ? losartan (COZAAR) 100 MG tablet Take 1 tablet (100 mg total) by mouth daily.  ? metoprolol succinate (TOPROL-XL) 50 MG 24 hr tablet Take 1 tablet (50 mg total) by mouth daily. Take with or immediately following a meal.  ? Multiple Vitamin (MULTIVITAMIN WITH MINERALS) TABS tablet Take 1 tablet by mouth daily.  ? nitroGLYCERIN (NITROSTAT)  0.4 MG SL tablet Place 1 tablet (0.4 mg total) under the tongue every 5 (five) minutes x 3 doses as needed for chest pain (if no relief after 3rd dose, proceed to the ED for an evaluation).  ? potassium chloride (KLOR-CON) 10 MEQ tablet TAKE 1 TABLET(10 MEQ) BY MOUTH DAILY  ?  ? ?Allergies:   Lisinopril  ? ?Social History  ? ?Socioeconomic History  ? Marital status: Widowed  ?  Spouse name: Not on file  ? Number of children: 3  ? Years of education: Not on file  ? Highest education level: Not on file  ?Occupational History  ? Not on file  ?Tobacco Use  ? Smoking status: Former  ?  Types: Cigarettes  ?  Quit date: 05/01/1991  ?  Years since quitting: 30.3  ? Smokeless tobacco: Never  ? Tobacco comments:  ?  Quit 30 years ago.    ?Vaping Use  ? Vaping Use: Never used  ?Substance  and Sexual Activity  ? Alcohol use: Yes  ?  Alcohol/week: 7.0 standard drinks  ?  Types: 7 Glasses of wine per week  ?  Comment: 1 glass of wine per day  ? Drug use: No  ? Sexual activity: Not on file  ?Other Topics Concern  ? Not on file  ?Social History Narrative  ? Not on file  ? ?Social Determinants of Health  ? ?Financial Resource Strain: Not on file  ?Food Insecurity: Not on file  ?Transportation Needs: Not on file  ?Physical Activity: Not on file  ?Stress: Not on file  ?Social Connections: Not on file  ?  ? ?Family History: ?The patient's family history includes Heart disease (age of onset: 68) in his father; Ovarian cancer in his mother. There is no history of Colon cancer. ? ?ROS:   ?Please see the history of present illness.    ? All other systems reviewed and are negative. ? ?EKGs/Labs/Other Studies Reviewed:   ? ?The following studies were reviewed today: ? ?LHC: 09/2016 ?Prox Cx to Mid Cx lesion, 30 %stenosed. ?Mid LAD lesion, 20 %stenosed. ?Ost 2nd Diag lesion, 20 %stenosed. ?Dist LAD lesion, 40 %stenosed. ?Ost RCA lesion, 100 %stenosed. ?SVG graft was visualized by angiography and is very large and anatomically normal. ?  ?1.  Chronic occlusion of the native RCA (vessel not re-implanted onto the aortic root graft) ?2. Patent SVG to PDA. The entire RCA fills from the patent vein graft.  ?3. Mild disease in the Circumflex and LAD. No cha

## 2021-08-15 NOTE — Patient Instructions (Signed)
Medication Instructions:  ?Your physician has recommended you make the following change in your medication:  ? ?START Imdur (isosorbide) 30 mg daily. An Rx has been sent to your pharmacy. ? ?*If you need a refill on your cardiac medications before your next appointment, please call your pharmacy* ? ? ?Lab Work: ?None ordered ?If you have labs (blood work) drawn today and your tests are completely normal, you will receive your results only by: ?MyChart Message (if you have MyChart) OR ?A paper copy in the mail ?If you have any lab test that is abnormal or we need to change your treatment, we will call you to review the results. ? ? ?Testing/Procedures: ?None ordered ? ? ?Follow-Up: ?At [facility], you and your health needs are our priority.  As part of our continuing mission to provide you with exceptional heart care, we have created designated Provider Care Teams.  These Care Teams include your primary Cardiologist (physician) and Advanced Practice Providers (APPs -  Physician Assistants and Nurse Practitioners) who all work together to provide you with the care you need, when you need it. ? ?We recommend signing up for the patient portal called "MyChart".  Sign up information is provided on this After Visit Summary.  MyChart is used to connect with patients for Virtual Visits (Telemedicine).  Patients are able to view lab/test results, encounter notes, upcoming appointments, etc.  Non-urgent messages can be sent to your provider as well.   ?To learn more about what you can do with MyChart, go to [email or web address].   ? ?Your next appointment:   ?2 week(s) ? ?The format for your next appointment:   ?In Person ? ?Provider:   ?You may see Rozann Lesches, MD or one of the following Advanced Practice Providers on your designated Care Team:   ?Bernerd Pho, PA-C  ?Ermalinda Barrios, PA-C { ? ? ? ?Other Instructions ?N/A ? ?Important Information About Sugar ? ? ? ? ?. ? ?

## 2021-08-17 DIAGNOSIS — I2510 Atherosclerotic heart disease of native coronary artery without angina pectoris: Secondary | ICD-10-CM

## 2021-08-18 MED ORDER — ISOSORBIDE MONONITRATE ER 30 MG PO TB24: 15.0000 mg | ORAL_TABLET | Freq: Every day | ORAL | Status: DC

## 2021-09-01 ENCOUNTER — Ambulatory Visit: Payer: Medicare Other | Admitting: Cardiology

## 2021-09-02 ENCOUNTER — Ambulatory Visit: Payer: Medicare Other | Admitting: Cardiology

## 2021-09-02 ENCOUNTER — Encounter: Payer: Self-pay | Admitting: Cardiology

## 2021-09-02 VITALS — BP 134/80 | HR 61 | Ht 72.0 in | Wt 271.0 lb

## 2021-09-02 DIAGNOSIS — Z953 Presence of xenogenic heart valve: Secondary | ICD-10-CM

## 2021-09-02 DIAGNOSIS — I10 Essential (primary) hypertension: Secondary | ICD-10-CM | POA: Diagnosis not present

## 2021-09-02 DIAGNOSIS — I25119 Atherosclerotic heart disease of native coronary artery with unspecified angina pectoris: Secondary | ICD-10-CM | POA: Diagnosis not present

## 2021-09-02 MED ORDER — SPIRONOLACTONE 25 MG PO TABS: 25.0000 mg | ORAL_TABLET | Freq: Every day | ORAL | 3 refills | Status: DC

## 2021-09-02 NOTE — Patient Instructions (Signed)
Medication Instructions:   Your physician has recommended you make the following change in your medication:   Stop Taking Lasix and Potassium  Start Aldactone 25 mg Daily   *If you need a refill on your cardiac medications before your next appointment, please call your pharmacy*   Lab Work: Your physician recommends that you return for lab work in: 2 [place] ( June 6)   If you have labs (blood work) drawn today and your tests are completely normal, you will receive your results only by: [employer] (if you have [place]) OR A paper copy in the mail If you have any lab test that is abnormal or we need to change your treatment, we will call you to review the results.   Testing/Procedures: NONE    Follow-Up: At [facility] you and your health needs are our priority.  As part of our continuing mission to provide you with exceptional heart care, we have created designated Provider Care Teams.  These Care Teams include your primary Cardiologist (physician) and Advanced Practice Providers (APPs -  Physician Assistants and Nurse Practitioners) who all work together to provide you with the care you need, when you need it.  We recommend signing up for the patient portal called "MyChart".  Sign up information is provided on this After Visit Summary.  MyChart is used to connect with patients for Virtual Visits (Telemedicine).  Patients are able to view lab/test results, encounter notes, upcoming appointments, etc.  Non-urgent messages can be sent to your provider as well.   To learn more about what you can do with MyChart, go to [email or web address].    Your next appointment:   6 week(s)  The format for your next appointment:   In Person  Provider:   You may see Rozann Lesches, MD or one of the following Advanced Practice Providers on your designated Care Team:   Bernerd Pho, PA-C  Ermalinda Barrios, PA-C     Other Instructions Thank you for choosing [facility]!    Important Information About Sugar

## 2021-09-02 NOTE — Addendum Note (Signed)
Addended by: Levonne Hubert on: 09/02/2021 01:51 PM   Modules accepted: Orders

## 2021-09-02 NOTE — Progress Notes (Signed)
Cardiology Office Note  Date: 09/02/2021   ID: Charles [name], DOB [birth year], MRN [id number]  PCP:  [name], MD  Cardiologist:  Rozann Lesches, MD Electrophysiologist:  None   Chief Complaint  Patient presents with   Cardiac follow-up    History of Present Illness: Charles [name] is a 76 y.o. male last seen on May 5 by Ms. Jorene Minors, I reviewed the note.  He is here for a follow-up visit.  He does not report any recurring chest discomfort, states that he had one episode that he mentioned to Ms. Jorene Minors, otherwise no major functional limitations.  Reports NYHA class II dyspnea.  No palpitations or syncope.  Imdur was added to his medications at last visit.  He had cut the dose back from 30 mg daily to 15 mg daily due to headaches.  Otherwise continues on Toprol-XL and Cozaar, I reviewed his home blood pressure checks which are still not optimal.  Today's blood pressure actually looks somewhat better.  We discussed taking him off Lasix and potassium supplement and replacing it with Aldactone.  He did have a follow-up echocardiogram in November 2022 as noted below.  Past Medical History:  Diagnosis Date   Anemia    Aortic insufficiency    a. 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   SVG to RCA with EVH via right thigh   S/P patent foramen ovale closure 10/02/2016    Past Surgical History:  Procedure Laterality Date   AORTIC VALVE  REPLACEMENT N/A 10/02/2016   Procedure: AORTIC ROOT REPLACEMENT  - using 25m Allograft;  Surgeon: ORexene Alberts MD;  [place]  Service: Open Heart Surgery;  Laterality: N/A;   COLONOSCOPY N/A 12/22/2017   Procedure: COLONOSCOPY;  Surgeon: RDaneil Dolin MD;  [facility];  Service: Endoscopy;  Laterality: N/A;  2:00pm - refused to move up   [place]ateway[place]/A 10/02/2016   Procedure: CORONARY ARTERY BYPASS GRAFTING (CABG) x one, using right leg greater saphenous vein harvested endoscopically;  Surgeon: ORexene Alberts MD;  [place]  Service: Open Heart Surgery;  Laterality: N/A;   FALSE ANEURYSM REPAIR  10/02/2016   Procedure: REPAIR FALSE ANEURYSM of Aortic Root Resection and Grafting of Aortic Root;  Surgeon: ORexene Alberts MD;  [place]  Service: Open Heart Surgery;;   HERNIA REPAIR Left    LEFT HEART CATH AND CORS/GRAFTS ANGIOGRAPHY N/A 10/07/2016   Procedure: Left Heart Cath and Cors/Grafts Angiography;  Surgeon: MBurnell Blanks MD;  [place];  Service: Cardiovascular;  Laterality: N/A;   MULTIPLE EXTRACTIONS WITH ALVEOLOPLASTY N/A 08/13/2016   Procedure: Extraction of tooth #'s 2,4,14 with alveoloplasty;  Surgeon: RLenn Cal DDS;  [place]  Service: Oral Surgery;  Laterality: N/A;   PATENT FORAMEN OVALE(PFO) CLOSURE N/A 10/02/2016   Procedure: PATENT FORAMEN OVALE (  PFO) CLOSURE;  Surgeon: [name], MD;  Location: [place];  Service: Open Heart Surgery;  Laterality: N/A;   RIGHT/LEFT HEART CATH AND CORONARY ANGIOGRAPHY N/A 08/03/2016   Procedure: Right/Left Heart Cath and Coronary Angiography;  Surgeon: Burnell Blanks, MD;  Location: [place] [facility];  Service: Cardiovascular;  Laterality: N/A;   TEE WITHOUT CARDIOVERSION N/A 07/07/2016   Procedure: TRANSESOPHAGEAL ECHOCARDIOGRAM (TEE);  Surgeon: Larey Dresser, MD;  Location: [place];  Service: Cardiovascular;  Laterality: N/A;    TEE WITHOUT CARDIOVERSION N/A 10/02/2016   Procedure: TRANSESOPHAGEAL ECHOCARDIOGRAM (TEE);  Surgeon: [name], MD;  Location: [place];  Service: Open Heart Surgery;  Laterality: N/A;   THORACIC AORTIC ANEURYSM REPAIR N/A 10/02/2016   Procedure: THORACIC ASCENDING ANEURYSM REPAIR  - using 42m Allograft;  Reimplantation of Left Coronary Button;  Surgeon: ORexene Alberts MD;  [place]  Service: Open Heart Surgery;  Laterality: N/A;    Current Outpatient Medications  Medication Sig Dispense Refill   amoxicillin (AMOXIL) 500 MG capsule TAKE 4 CAPSULES BY MOUTH 1 HOUR BEFORE PROCEDURE 12 capsule 0   aspirin EC 81 MG tablet Take 81 mg by mouth daily.     atorvastatin (LIPITOR) 40 MG tablet TAKE 1 TABLET(40 MG) BY MOUTH DAILY 90 tablet 3   fluticasone (FLONASE) 50 MCG/ACT nasal spray PLACE 2 SPRAYS INTO EACH NOSTRIL EVERY NIGHT AT BEDTIME 48 g 6   furosemide (LASIX) 20 MG tablet TAKE 1 TABLET(20 MG) BY MOUTH DAILY 90 tablet 3   isosorbide mononitrate (IMDUR) 30 MG 24 hr tablet Take 0.5 tablets (15 mg total) by mouth daily.     losartan (COZAAR) 100 MG tablet Take 1 tablet (100 mg total) by mouth daily. 90 tablet 3   metoprolol succinate (TOPROL-XL) 50 MG 24 hr tablet Take 1 tablet (50 mg total) by mouth daily. Take with or immediately following a meal. 90 tablet 3   Multiple Vitamin (MULTIVITAMIN WITH MINERALS) TABS tablet Take 1 tablet by mouth daily.     nitroGLYCERIN (NITROSTAT) 0.4 MG SL tablet Place 1 tablet (0.4 mg total) under the tongue every 5 (five) minutes x 3 doses as needed for chest pain (if no relief after 3rd dose, proceed to the ED for an evaluation). 25 tablet 3   spironolactone (ALDACTONE) 25 MG tablet Take 1 tablet (25 mg total) by mouth daily. 90 tablet 3   No current facility-administered medications for this visit.   Allergies:  Lisinopril   ROS: No palpitations or syncope.  Physical Exam: VS:  BP 134/80   Pulse 61   Ht 6' (1.829 m)   Wt 271 lb (122.9  kg)   SpO2 93%   BMI 36.75 kg/m , BMI Body mass index is 36.75 kg/m.  Wt Readings from Last 3 Encounters:  09/02/21 271 lb (122.9 kg)  08/15/21 267 lb (121.1 kg)  04/04/21 272 lb 3.2 oz (123.5 kg)    General: Patient appears comfortable at rest. HEENT: Conjunctiva and lids normal, oropharynx clear. Neck: Supple, no elevated JVP or carotid bruits, no thyromegaly. Lungs: Clear to auscultation, nonlabored breathing at rest. Cardiac: Regular rate and rhythm, no S3, 2/6 systolic murmur, no pericardial rub. Extremities: No pitting edema.  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  Recent Labwork: 07/30/2021: BUN 26; Creatinine, Ser 1.11; Potassium 3.8; Sodium 140     Component Value Date/Time  CHOL 110 05/10/2018 0409   TRIG 164 (H) 05/10/2018 0409   HDL 34 (L) 05/10/2018 0409   CHOLHDL 3.2 05/10/2018 0409   VLDL 33 05/10/2018 0409   LDLCALC 43 05/10/2018 0409    Other Studies Reviewed Today:  Cardiac catheterization 10/07/2016: Prox Cx to Mid Cx lesion, 30 %stenosed. Mid LAD lesion, 20 %stenosed. Ost 2nd Diag lesion, 20 %stenosed. Dist LAD lesion, 40 %stenosed. Ost RCA lesion, 100 %stenosed. SVG graft was visualized by angiography and is very large and anatomically normal.   1. 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 Patent SVG to PDA. The entire RCA fills from the patent vein graft.  3. Mild disease in the Circumflex and LAD. No change since last cath.  4. Normal LVEDP  Echocardiogram 02/14/2021:  1. Left ventricular ejection fraction, by estimation, is 60 to 65%. The  left ventricle has normal function. The left ventricle has no regional  wall motion abnormalities. There is moderate left ventricular hypertrophy.  Left ventricular diastolic  parameters are consistent with Grade I diastolic dysfunction  (impaired  relaxation).   2. Right ventricular systolic function is normal. The right ventricular  size is normal. Tricuspid regurgitation signal is inadequate for assessing  PA pressure.   3. The mitral valve is grossly normal. Trivial mitral valve  regurgitation.   4. The aortic valve has been repaired/replaced. Aortic valve  regurgitation is mild. There is a 25 mm human allograft aortic root graft  valve present in the aortic position.   5. Aortic dilatation noted. There is mild dilatation of the aortic root,  measuring 42 mm.   6. The inferior vena cava is normal in size with greater than 50%  respiratory variability, suggesting right atrial pressure of 3 mmHg.   Assessment and Plan:  1.  Essential hypertension.  Plan to continue Toprol-XL 50 mg daily, Cozaar 100 mg daily, and take him off Lasix with potassium supplement while replacing with Aldactone 25 mg daily.  Check BMET in 2 weeks.  He will continue to track blood pressure trend at home.  2.  Single episode of chest pain, no recurrences.  He does have history of CAD status post CABG with SVG to PDA.  Cardiac catheterization in 2018 did show a chronically occluded RCA with patent SVG to PDA and mild left system disease.  Continue low-dose Imdur for now along with aspirin and Lipitor.  3.  History of severe aortic regurgitation and bacterial endocarditis status post aortic root replacement with allograft.  Follow-up echocardiogram in November 2022 showed normal AVR function with mild aortic regurgitation.  Medication Adjustments/Labs and Tests Ordered: Current medicines are reviewed at length with the patient today.  Concerns regarding medicines are outlined above.   Tests Ordered: Orders Placed This Encounter  Procedures   Basic metabolic panel    Medication Changes: Meds ordered this encounter  Medications   spironolactone (ALDACTONE) 25 MG tablet    Sig: Take 1 tablet (25 mg total) by mouth daily.    Dispense:  90  tablet    Refill:  3    Disposition:  Follow up  6 weeks.  Signed, Satira Sark, MD, [facility] 09/02/2021 1:49 PM    [place] [facility] [address] [place], [place] [address] Phone: [phone]; Fax: [phone]

## 2021-10-13 ENCOUNTER — Other Ambulatory Visit (HOSPITAL_COMMUNITY)
Admission: RE | Admit: 2021-10-13 | Discharge: 2021-10-13 | Disposition: A | Discharge status: Home / Self Care | Payer: Medicare Other | Source: Ambulatory Visit | Attending: Cardiology | Admitting: Cardiology

## 2021-10-13 ENCOUNTER — Telehealth: Payer: Self-pay | Admitting: Cardiology

## 2021-10-13 DIAGNOSIS — I10 Essential (primary) hypertension: Secondary | ICD-10-CM | POA: Diagnosis not present

## 2021-10-13 LAB — BASIC METABOLIC PANEL
Anion gap: 6 (ref 5–15)
BUN: 30 mg/dL — ABNORMAL HIGH (ref 8–23)
CO2: 26 mmol/L (ref 22–32)
Calcium: 8.5 mg/dL — ABNORMAL LOW (ref 8.9–10.3)
Chloride: 108 mmol/L (ref 98–111)
Creatinine, Ser: 1.24 mg/dL (ref 0.61–1.24)
GFR, Estimated: >60 mL/min (ref >60)
Glucose, Bld: 142 mg/dL — ABNORMAL HIGH (ref 70–99)
Potassium: 3.8 mmol/L (ref 3.5–5.1)
Sodium: 140 mmol/L (ref 135–145)

## 2021-10-13 NOTE — Telephone Encounter (Signed)
Patient calling to find out if he needs labs prior to his appointment 7/5.

## 2021-10-13 NOTE — Telephone Encounter (Signed)
Pt has access to MyChart- Communication via mychart.

## 2021-10-15 ENCOUNTER — Encounter: Payer: Self-pay | Admitting: Student

## 2021-10-15 ENCOUNTER — Ambulatory Visit: Payer: Medicare Other | Admitting: Student

## 2021-10-15 VITALS — BP 122/68 | HR 64 | Ht 72.0 in | Wt 265.0 lb

## 2021-10-15 DIAGNOSIS — Z954 Presence of other heart-valve replacement: Secondary | ICD-10-CM

## 2021-10-15 DIAGNOSIS — E785 Hyperlipidemia, unspecified: Secondary | ICD-10-CM | POA: Diagnosis not present

## 2021-10-15 DIAGNOSIS — I10 Essential (primary) hypertension: Secondary | ICD-10-CM | POA: Diagnosis not present

## 2021-10-15 DIAGNOSIS — I2510 Atherosclerotic heart disease of native coronary artery without angina pectoris: Secondary | ICD-10-CM

## 2021-10-15 NOTE — Patient Instructions (Signed)
Medication Instructions:  STOP Imdur   Labwork: None today  Testing/Procedures: None today  Follow-Up: 4-5 months Dr.McDowell  Any Other Special Instructions Will Be Listed Below (If Applicable).  If you need a refill on your cardiac medications before your next appointment, please call your pharmacy.

## 2021-10-15 NOTE — Progress Notes (Signed)
Cardiology Office Note    Date:  10/15/2021   ID:  Charles [name], DOB 1946-04-11, MRN [id number]  PCP:  [name], MD  Cardiologist: Rozann Lesches, MD    Chief Complaint  Patient presents with   Follow-up    6 week visit    History of Present Illness:    Charles [name] is a 76 y.o. male with past medical history of severe aortic regurgitation and bacterial endocarditis (s/p aortic root replacement and allograft and closure of PFO in 09/2016), CAD (s/p SVG-distal RCA in 09/2016), HTN and HLD who presents to the office today for 6-week follow-up.   He was examined by Tarri Glenn, PA in 08/2021 and reported episodes of sharp chest pain for the past several days which would last for a few minutes and spontaneously resolve. BP had been elevated at home and was at 154/86 during his office visit. He was continued on Toprol-XL and Losartan with Imdur '30mg'$  daily being added to his medication regimen and if he had recurrent chest pain, would plan for a NST. He did follow-up with Dr. Domenic Polite on 09/02/2021 and had reduced his Imdur from '30mg'$  daily to '15mg'$  daily due to headaches but denied any recurrent chest pain. Given elevated BP, Lasix was discontinued and he was started on Spironolactone '25mg'$  daily. He did have repeat labs on 10/13/2021 which showed Na+ 140 and K+ 3.8 and creatinine was at 1.24 (previously 1.1  -1.2).   In talking with the patient today, he reports overall feeling well since his last visit. He denies any recent chest pain or dyspnea on exertion. Reports occasional dyspnea when lying down at night to go to sleep but symptoms spontaneously resolve within a few seconds. Not having to sleep with additional pillows. He does experience intermittent lower extremity edema which is most notable along his right leg as this was his vein graft harvest site at the time of CABG. No acute changes in this or weight gain. He tries to limit his sodium intake. He is concerned about the number  of medications he is now taking and wishes to stop Imdur if possible given no recent pain.  Past Medical History:  Diagnosis Date   Anemia    Aortic insufficiency    a. 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   SVG to RCA with EVH via right thigh   S/P patent foramen ovale closure 10/02/2016    Past Surgical History:  Procedure Laterality Date   AORTIC VALVE REPLACEMENT N/A 10/02/2016   Procedure: AORTIC ROOT REPLACEMENT  - using 33m Allograft;  Surgeon: ORexene Alberts MD;  [facility]  Service: Open Heart Surgery;  Laterality: N/A;   COLONOSCOPY N/A 12/22/2017   Procedure: COLONOSCOPY;  Surgeon: RDaneil Dolin MD;  [facility];  Service: Endoscopy;  Laterality: N/A;  2:00pm - refused to move up   [place] N/A 10/02/2016   Procedure: CORONARY ARTERY BYPASS GRAFTING (CABG) x one, using right leg greater saphenous vein harvested endoscopically;  Surgeon: [name], MD;  Location: [place];  Service: Open Heart Surgery;  Laterality: N/A;   FALSE ANEURYSM REPAIR  10/02/2016   Procedure: REPAIR FALSE ANEURYSM of Aortic Root Resection and Grafting of Aortic Root;  Surgeon: [name], MD;  Location: [place];  Service: Open Heart Surgery;;   HERNIA REPAIR Left    LEFT HEART CATH AND CORS/GRAFTS ANGIOGRAPHY N/A 10/07/2016   Procedure: Left Heart Cath and Cors/Grafts Angiography;  Surgeon: Burnell Blanks, MD;   Location: [place] [facility];  Service: Cardiovascular;  Laterality: N/A;   MULTIPLE EXTRACTIONS WITH ALVEOLOPLASTY N/A 08/13/2016   Procedure: Extraction of tooth #'s 2,4,14 with alveoloplasty;  Surgeon: Lenn Cal, DDS;  Location: [place];  Service: Oral Surgery;  Laterality: N/A;   PATENT FORAMEN OVALE(PFO) CLOSURE N/A 10/02/2016   Procedure: PATENT FORAMEN OVALE (PFO) CLOSURE;  Surgeon: [name], MD;  Location: [place];  Service: Open Heart Surgery;  Laterality: N/A;   RIGHT/LEFT HEART CATH AND CORONARY ANGIOGRAPHY N/A 08/03/2016   Procedure: Right/Left Heart Cath and Coronary Angiography;  Surgeon: Burnell Blanks, MD;  Location: [place] [facility];  Service: Cardiovascular;  Laterality: N/A;   TEE WITHOUT CARDIOVERSION N/A 07/07/2016   Procedure: TRANSESOPHAGEAL ECHOCARDIOGRAM (TEE);  Surgeon: Larey Dresser, MD;  Location: [place];  Service: Cardiovascular;  Laterality: N/A;   TEE WITHOUT CARDIOVERSION N/A 10/02/2016   Procedure: TRANSESOPHAGEAL ECHOCARDIOGRAM (TEE);  Surgeon: [name], MD;  Location: [place];  Service: Open Heart Surgery;  Laterality: N/A;   THORACIC AORTIC ANEURYSM REPAIR N/A 10/02/2016   Procedure: THORACIC ASCENDING ANEURYSM REPAIR  - using 58m Allograft;  Reimplantation of Left Coronary Button;  Surgeon: ORexene Alberts MD;  [place]  Service: Open Heart Surgery;  Laterality: N/A;    Current Medications: Outpatient Medications Prior to Visit  Medication Sig Dispense Refill   amoxicillin (AMOXIL) 500 MG capsule TAKE 4 CAPSULES BY MOUTH 1 HOUR BEFORE PROCEDURE 12 capsule 0   aspirin EC 81 MG tablet Take 81 mg by mouth daily.     atorvastatin (LIPITOR) 40 MG tablet TAKE 1 TABLET(40 MG) BY MOUTH DAILY 90 tablet 3   fluticasone (FLONASE) 50 MCG/ACT nasal spray PLACE 2 SPRAYS INTO EACH NOSTRIL EVERY NIGHT AT BEDTIME 48 g 6   losartan (COZAAR) 100 MG tablet Take 1 tablet (100 mg total) by mouth daily. 90 tablet 3   metoprolol succinate  (TOPROL-XL) 50 MG 24 hr tablet Take 1 tablet (50 mg total) by mouth daily. Take with or immediately following a meal. 90 tablet 3   Multiple Vitamin (MULTIVITAMIN WITH MINERALS) TABS tablet Take 1 tablet by mouth daily.     nitroGLYCERIN (NITROSTAT) 0.4 MG SL tablet Place 1 tablet (0.4 mg total) under the tongue every 5 (five) minutes x 3 doses as needed for chest pain (if no relief after 3rd dose, proceed to the ED for an evaluation). 25 tablet 3   spironolactone (ALDACTONE) 25 MG tablet Take 1 tablet (25 mg total) by mouth daily. 90 tablet 3   isosorbide mononitrate (IMDUR) 30 MG 24 hr tablet Take 0.5 tablets (15 mg total) by mouth daily.     No facility-administered medications prior to visit.     Allergies:   Lisinopril   Social History   Socioeconomic History   Marital status: Widowed  Spouse name: Not on file   Number of children: 3   Years of education: Not on file   Highest education level: Not on file  Occupational History   Not on file  Tobacco Use   Smoking status: Former    Types: Cigarettes    Quit date: 05/01/1991    Years since quitting: 30.4   Smokeless tobacco: Never   Tobacco comments:    Quit 30 years ago.    Vaping Use   Vaping Use: Never used  Substance and Sexual Activity   Alcohol use: Yes    Alcohol/week: 7.0 standard drinks of alcohol    Types: 7 Glasses of wine per week    Comment: 1 glass of wine per day   Drug use: No   Sexual activity: Not on file  Other Topics Concern   Not on file  Social History Narrative   Not on file   Social Determinants of Health   Financial Resource Strain: Not on file  Food Insecurity: Not on file  Transportation Needs: Not on file  Physical Activity: Not on file  Stress: Not on file  Social Connections: Not on file     Family History:  The patient's family history includes Heart disease (age of onset: 43) in his father; Ovarian cancer in his mother.   Review of Systems:    Please see the history of  present illness.     All other systems reviewed and are otherwise negative except as noted above.   Physical Exam:    VS:  BP 122/68   Pulse 64   Ht 6' (1.829 m)   Wt 265 lb (120.2 kg)   SpO2 94%   BMI 35.94 kg/m    General: Pleasant male appearing in no acute distress. Head: Normocephalic, atraumatic. Neck: No carotid bruits. JVD not elevated.  Lungs: Respirations regular and unlabored, without wheezes or rales.  Heart: Regular rate and rhythm. No S3 or S4. 2/6 systolic murmur along RUSB.  Abdomen: Appears non-distended. No obvious abdominal masses. Msk:  Strength and tone appear normal for age. No obvious joint deformities or effusions. Extremities: No clubbing or cyanosis. Trace edema along RLE, no edema along LLE.  Distal pedal pulses are 2+ bilaterally. Neuro: Alert and oriented X 3. Moves all extremities spontaneously. No focal deficits noted. Psych:  Responds to questions appropriately with a normal affect. Skin: No rashes or lesions noted  Wt Readings from Last 3 Encounters:  10/15/21 265 lb (120.2 kg)  09/02/21 271 lb (122.9 kg)  08/15/21 267 lb (121.1 kg)     Studies/Labs Reviewed:   EKG:  EKG is not ordered today.    Recent Labs: 10/13/2021: BUN 30; Creatinine, Ser 1.24; Potassium 3.8; Sodium 140   Lipid Panel    Component Value Date/Time   CHOL 110 05/10/2018 0409   TRIG 164 (H) 05/10/2018 0409   HDL 34 (L) 05/10/2018 0409   CHOLHDL 3.2 05/10/2018 0409   VLDL 33 05/10/2018 0409   LDLCALC 43 05/10/2018 0409    Additional studies/ records that were reviewed today include:   LHC: 09/2016 Prox Cx to Mid Cx lesion, 30 %stenosed. Mid LAD lesion, 20 %stenosed. Ost 2nd Diag lesion, 20 %stenosed. Dist LAD lesion, 40 %stenosed. Ost RCA lesion, 100 %stenosed. SVG graft was visualized by angiography and is very large and anatomically normal.   1. 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 Patent SVG to PDA.  The entire RCA  fills from the patent vein graft.  3. Mild disease in the Circumflex and LAD. No change since last cath.  4. Normal LVEDP   Recommendations: No further ischemic workup  Echocardiogram: 02/2021 IMPRESSIONS     1. Left ventricular ejection fraction, by estimation, is 60 to 65%. The  left ventricle has normal function. The left ventricle has no regional  wall motion abnormalities. There is moderate left ventricular hypertrophy.  Left ventricular diastolic  parameters are consistent with Grade I diastolic dysfunction (impaired  relaxation).   2. Right ventricular systolic function is normal. The right ventricular  size is normal. Tricuspid regurgitation signal is inadequate for assessing  PA pressure.   3. The mitral valve is grossly normal. Trivial mitral valve  regurgitation.   4. The aortic valve has been repaired/replaced. Aortic valve  regurgitation is mild. There is a 25 mm human allograft aortic root graft  valve present in the aortic position.   5. Aortic dilatation noted. There is mild dilatation of the aortic root,  measuring 42 mm.   6. The inferior vena cava is normal in size with greater than 50%  respiratory variability, suggesting right atrial pressure of 3 mmHg.   Assessment:    1. Essential hypertension   2. S/P aortic valve replacement with allograft   3. Coronary artery disease involving native coronary artery of native heart without angina pectoris   4. Hyperlipidemia LDL goal <70      Plan:   In order of problems listed above:  1. HTN - His blood pressure has improved with recent medication adjustments over the past few months and BP is at 122/68 during today's visit. Continue current medical therapy with Losartan 100 mg daily, Toprol-XL 50 mg daily and Spironolactone 25 mg daily. Renal function was stable by recent labs. He wishes to stop Imdur if possible given no recent chest pain and I encouraged him to follow readings at home with this  adjustment but I doubt this will make a substantial difference in readings given he was only on 15 mg daily. If BP remains above goal over time, could consider transitioning Toprol-XL to Coreg.   2. History of Severe AI - He is s/p aortic root replacement and allograft and closure of PFO in 09/2016. Echocardiogram in 02/2021 showed normal valve function with only mild dilatation of the aortic root measuring 42 mm. Will continue to follow over time.  3. CAD - He is s/p SVG-distal RCA in 09/2016. He was previously having episodes of atypical chest pain lasting for seconds at a time and this was in the setting of elevated BP. He denies any recurrent pain since blood pressure has improved and wishes to stop Imdur. Encouraged him to follow BP readings at home with this adjustment. Continue medical therapy including ASA 81 mg daily, Atorvastatin 40 mg daily and Toprol-XL 50 mg daily.  4. HLD - Followed by his PCP. He remains on Atorvastatin 40 mg daily with goal LDL less than 70.    Medication Adjustments/Labs and Tests Ordered: Current medicines are reviewed at length with the patient today.  Concerns regarding medicines are outlined above.  Medication changes, Labs and Tests ordered today are listed in the Patient Instructions below. Patient Instructions  Medication Instructions:  STOP Imdur   Labwork: None today  Testing/Procedures: None today  Follow-Up: 4-5 months Dr.McDowell  Any Other Special Instructions Will Be Listed Below (If Applicable).  If you need a refill on your cardiac medications before your next appointment, please  call your pharmacy.    Signed, Erma Heritage, PA-C  10/15/2021 5:17 PM    [place] [address] [place], [place] [address] Phone: [phone] Fax: [phone]

## 2021-11-03 DIAGNOSIS — R7303 Prediabetes: Secondary | ICD-10-CM | POA: Diagnosis not present

## 2021-11-03 DIAGNOSIS — I10 Essential (primary) hypertension: Secondary | ICD-10-CM | POA: Diagnosis not present

## 2021-11-03 DIAGNOSIS — Z125 Encounter for screening for malignant neoplasm of prostate: Secondary | ICD-10-CM | POA: Diagnosis not present

## 2021-11-11 DIAGNOSIS — D509 Iron deficiency anemia, unspecified: Secondary | ICD-10-CM | POA: Diagnosis not present

## 2021-11-11 DIAGNOSIS — E782 Mixed hyperlipidemia: Secondary | ICD-10-CM | POA: Diagnosis not present

## 2021-11-11 DIAGNOSIS — R7303 Prediabetes: Secondary | ICD-10-CM | POA: Diagnosis not present

## 2021-11-11 DIAGNOSIS — R944 Abnormal results of kidney function studies: Secondary | ICD-10-CM | POA: Diagnosis not present

## 2022-02-11 DIAGNOSIS — H5203 Hypermetropia, bilateral: Secondary | ICD-10-CM | POA: Diagnosis not present

## 2022-02-25 ENCOUNTER — Ambulatory Visit: Payer: Medicare Other | Admitting: Cardiology

## 2022-02-26 NOTE — Progress Notes (Signed)
Cardiology Office Note  Date: 02/27/2022   ID: Charles [name], DOB [birth year], MRN [id number]  PCP:  [name], MD  Cardiologist:  Rozann Lesches, MD Electrophysiologist:  None   Chief Complaint  Patient presents with   Cardiac follow-up    History of Present Illness: Charles [name] is a 76 y.o. male last seen in July by Ms. Strader PA-C, I reviewed the note.  He is here for a routine visit.  Reports stable NYHA class II dyspnea, no angina.  Rare sense of palpitations, but nothing prolonged.  No dizziness or syncope.  I reviewed his medications which are noted below.  Blood pressure control is adequate today.  He reports no intolerances.  Echocardiogram from November of last year revealed LVEF 60 to 65% with mild diastolic dysfunction, stable AVR allograft with mild aortic regurgitation.  Aortic root dilated at 42 mm.  He continues to follow with Dr. Nevada Crane.  I reviewed his interval lab work as noted below.  Past Medical History:  Diagnosis Date   Anemia    Aortic insufficiency    a. 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   SVG to RCA with EVH via right thigh   S/P patent foramen ovale closure 10/02/2016    Past Surgical History:  Procedure Laterality Date   AORTIC VALVE REPLACEMENT N/A 10/02/2016   Procedure: AORTIC ROOT REPLACEMENT  - using 54m Allograft;  Surgeon: ORexene Alberts MD;  [place] [facility]  Service: Open Heart  Surgery;  Laterality: N/A;   COLONOSCOPY N/A 12/22/2017   Procedure: COLONOSCOPY;  Surgeon: RDaneil Dolin MD;  [facility];  Service: Endoscopy;  Laterality: N/A;  2:00pm - refused to move up   [place] 10/02[place]2016   Procedure: CORONARY ARTERY BYPASS GRAFTING (CABG) x one, using right leg greater saphenous vein harvested endoscopically;  Surgeon: ORexene Alberts MD;  [place] [facility]  Service: Open Heart Surgery;  Laterality: N/A;   FALSE ANEURYSM REPAIR  10/02/2016   Procedure: REPAIR FALSE ANEURYSM of Aortic Root Resection and Grafting of Aortic Root;  Surgeon: ORexene Alberts MD;  [place]  Service: Open Heart Surgery;;   HERNIA REPAIR Left    LEFT HEART CATH AND CORS/GRAFTS ANGIOGRAPHY N/A 10/07/2016   Procedure: Left Heart Cath and Cors/Grafts Angiography;  Surgeon: MBurnell Blanks MD;  [facility];  Service: Cardiovascular;  Laterality: N/A;   MULTIPLE EXTRACTIONS WITH ALVEOLOPLASTY N/A 08/13/2016   Procedure: Extraction of tooth #'s 2,4,14 with alveoloplasty;  Surgeon: RLenn Cal DDS;  [place] [facility]  Service: Oral Surgery;  Laterality: N/A;   PATENT FORAMEN OVALE(PFO) CLOSURE N/A 10/02/2016   Procedure: PATENT FORAMEN OVALE (PFO) CLOSURE;  Surgeon: ORexene Alberts MD;  [place]  Service: Open Heart Surgery;  Laterality: N/A;   RIGHT/LEFT HEART CATH AND CORONARY ANGIOGRAPHY N/A 08/03/2016  Procedure: Right/Left Heart Cath and Coronary Angiography;  Surgeon: Burnell Blanks, MD;  Location: [facility] [facility];  Service: Cardiovascular;  Laterality: N/A;   TEE WITHOUT CARDIOVERSION N/A 07/07/2016   Procedure: TRANSESOPHAGEAL ECHOCARDIOGRAM (TEE);  Surgeon: Larey Dresser, MD;  Location: [place];  Service: Cardiovascular;  Laterality: N/A;   TEE WITHOUT CARDIOVERSION N/A 10/02/2016   Procedure: TRANSESOPHAGEAL ECHOCARDIOGRAM (TEE);  Surgeon: [name], MD;  Location: [place];  Service: Open  Heart Surgery;  Laterality: N/A;   THORACIC AORTIC ANEURYSM REPAIR N/A 10/02/2016   Procedure: THORACIC ASCENDING ANEURYSM REPAIR  - using 110m Allograft;  Reimplantation of Left Coronary Button;  Surgeon: ORexene Alberts MD;  [place] [facility]  Service: Open Heart Surgery;  Laterality: N/A;    Current Outpatient Medications  Medication Sig Dispense Refill   amoxicillin (AMOXIL) 500 MG capsule TAKE 4 CAPSULES BY MOUTH 1 HOUR BEFORE PROCEDURE 12 capsule 0   aspirin EC 81 MG tablet Take 81 mg by mouth daily.     atorvastatin (LIPITOR) 40 MG tablet TAKE 1 TABLET(40 MG) BY MOUTH DAILY 90 tablet 3   fluticasone (FLONASE) 50 MCG/ACT nasal spray PLACE 2 SPRAYS INTO EACH NOSTRIL EVERY NIGHT AT BEDTIME 48 g 6   losartan (COZAAR) 100 MG tablet Take 1 tablet (100 mg total) by mouth daily. 90 tablet 3   Multiple Vitamin (MULTIVITAMIN WITH MINERALS) TABS tablet Take 1 tablet by mouth daily.     nitroGLYCERIN (NITROSTAT) 0.4 MG SL tablet Place 1 tablet (0.4 mg total) under the tongue every 5 (five) minutes x 3 doses as needed for chest pain (if no relief after 3rd dose, proceed to the ED for an evaluation). 25 tablet 3   spironolactone (ALDACTONE) 25 MG tablet Take 1 tablet (25 mg total) by mouth daily. 90 tablet 3   metoprolol succinate (TOPROL-XL) 50 MG 24 hr tablet Take 1 tablet (50 mg total) by mouth daily. Take with or immediately following a meal. 90 tablet 3   No current facility-administered medications for this visit.   Allergies:  Lisinopril   ROS: No syncope.  Physical Exam: VS:  BP 130/86 (BP Location: Left Arm, Patient Position: Sitting, Cuff Size: Large)   Pulse 60   Ht 6' (1.829 m)   Wt 270 lb (122.5 kg)   BMI 36.62 kg/m , BMI Body mass index is 36.62 kg/m.  Wt Readings from Last 3 Encounters:  02/27/22 270 lb (122.5 kg)  10/15/21 265 lb (120.2 kg)  09/02/21 271 lb (122.9 kg)    General: Patient appears comfortable at rest. HEENT: Conjunctiva and lids normal. Neck: Supple, no  elevated JVP or carotid bruits. Lungs: Clear to auscultation, nonlabored breathing at rest. Cardiac: Regular rate and rhythm, no S3, 2/6 systolic murmur. Abdomen: Soft, nontender, bowel sounds present. Extremities: No pitting edema.  ECG:  An ECG dated 08/15/2021 was personally reviewed today and demonstrated:  Sinus bradycardia with right bundle branch block and left anterior fascicular block.  Recent Labwork: 10/13/2021: BUN 30; Creatinine, Ser 1.24; Potassium 3.8; Sodium 140     Component Value Date/Time   CHOL 110 05/10/2018 0409   TRIG 164 (H) 05/10/2018 0409   HDL 34 (L) 05/10/2018 0409   CHOLHDL 3.2 05/10/2018 0409   VLDL 33 05/10/2018 0409   LDLCALC 43 05/10/2018 0409  July 2022: Cholesterol 153, glycerides 202, HDL 39, LDL 80  Other Studies Reviewed Today:  Echocardiogram 02/14/2021:  1. Left ventricular ejection fraction, by estimation, is 60 to 65%. The  left ventricle has normal function. The left ventricle has no regional  wall motion abnormalities. There is moderate left ventricular hypertrophy.  Left ventricular diastolic  parameters are consistent with Grade I diastolic dysfunction (impaired  relaxation).   2. Right ventricular systolic function is normal. The right ventricular  size is normal. Tricuspid regurgitation signal is inadequate for assessing  PA pressure.   3. The mitral valve is grossly normal. Trivial mitral valve  regurgitation.   4. The aortic valve has been repaired/replaced. Aortic valve  regurgitation is mild. There is a 25 mm human allograft aortic root graft  valve present in the aortic position.   5. Aortic dilatation noted. There is mild dilatation of the aortic root,  measuring 42 mm.   6. The inferior vena cava is normal in size with greater than 50%  respiratory variability, suggesting right atrial pressure of 3 mmHg.   Assessment and Plan:  1.  History of severe aortic regurgitation and bacterial endocarditis status post aortic root  replacement with allograft.  Clinically stable at this point, NYHA class II dyspnea.  No fevers or chills.  He does have mild aortic root dilatation at 42 mm by echocardiogram last year, stable be updated for surveillance.  2.  CAD status post CABG with SVG to PDA.  No active angina at this time he is on aspirin, Lipitor, Toprol-XL, and has as needed nitroglycerin available.  3.  Essential hypertension, currently on losartan and Aldactone.  Blood pressure control adequate today.  Medication Adjustments/Labs and Tests Ordered: Current medicines are reviewed at length with the patient today.  Concerns regarding medicines are outlined above.   Tests Ordered: No orders of the defined types were placed in this encounter.   Medication Changes: No orders of the defined types were placed in this encounter.   Disposition:  Follow up  6 months.  Signed, Satira Sark, MD, [facility] 02/27/2022 9:38 AM    [facility], [place], [place] [address] Phone: [phone]; Fax: [phone]

## 2022-02-27 ENCOUNTER — Encounter: Payer: Self-pay | Admitting: Cardiology

## 2022-02-27 ENCOUNTER — Ambulatory Visit: Payer: Medicare Other | Attending: Cardiology | Admitting: Cardiology

## 2022-02-27 VITALS — BP 130/86 | HR 60 | Ht 72.0 in | Wt 270.0 lb

## 2022-02-27 DIAGNOSIS — Z953 Presence of xenogenic heart valve: Secondary | ICD-10-CM

## 2022-02-27 DIAGNOSIS — I10 Essential (primary) hypertension: Secondary | ICD-10-CM

## 2022-02-27 DIAGNOSIS — I25119 Atherosclerotic heart disease of native coronary artery with unspecified angina pectoris: Secondary | ICD-10-CM | POA: Diagnosis not present

## 2022-02-27 NOTE — Patient Instructions (Addendum)
Medication Instructions:  Continue all current medications.  Labwork: none  Testing/Procedures: Your physician has requested that you have an echocardiogram. Echocardiography is a painless test that uses sound waves to create images of your heart. It provides your doctor with information about the size and shape of your heart and how well your heart's chambers and valves are working. This procedure takes approximately one hour. There are no restrictions for this procedure. Please do NOT wear cologne, perfume, aftershave, or lotions (deodorant is allowed). Please arrive 15 minutes prior to your appointment time. Office will contact with results via phone, letter or mychart.     Follow-Up: 6 months   Any Other Special Instructions Will Be Listed Below (If Applicable).   If you need a refill on your cardiac medications before your next appointment, please call your pharmacy.

## 2022-03-02 ENCOUNTER — Other Ambulatory Visit: Payer: Self-pay | Admitting: Cardiology

## 2022-03-02 DIAGNOSIS — I359 Nonrheumatic aortic valve disorder, unspecified: Secondary | ICD-10-CM

## 2022-03-02 DIAGNOSIS — I25119 Atherosclerotic heart disease of native coronary artery with unspecified angina pectoris: Secondary | ICD-10-CM

## 2022-03-02 DIAGNOSIS — I10 Essential (primary) hypertension: Secondary | ICD-10-CM

## 2022-03-02 DIAGNOSIS — Z953 Presence of xenogenic heart valve: Secondary | ICD-10-CM

## 2022-03-03 ENCOUNTER — Ambulatory Visit: Payer: Medicare Other | Attending: Cardiology

## 2022-03-03 DIAGNOSIS — I359 Nonrheumatic aortic valve disorder, unspecified: Secondary | ICD-10-CM

## 2022-03-03 LAB — ECHOCARDIOGRAM COMPLETE
AR max vel: 1.9 cm2
AV Peak grad: 13 mmHg
Ao pk vel: 1.81 m/s
Area-P 1/2: 2.33 cm2
Calc EF: 62 %
MV M vel: 3.43 m/s
MV Peak grad: 47.1 mmHg
P 1/2 time: 1510 msec
S' Lateral: 3.7 cm
Single Plane A2C EF: 50.5 %
Single Plane A4C EF: 70.9 %

## 2022-03-09 ENCOUNTER — Encounter: Payer: Self-pay | Admitting: Cardiology

## 2022-03-20 ENCOUNTER — Other Ambulatory Visit: Payer: Self-pay | Admitting: Student

## 2022-04-14 ENCOUNTER — Telehealth: Payer: Self-pay | Admitting: Cardiology

## 2022-04-14 DIAGNOSIS — K080 Exfoliation of teeth due to systemic causes: Secondary | ICD-10-CM | POA: Diagnosis not present

## 2022-04-14 MED ORDER — AMOXICILLIN 500 MG PO CAPS: ORAL_CAPSULE | ORAL | 0 refills | Status: AC

## 2022-04-14 NOTE — Telephone Encounter (Signed)
*  STAT* If patient is at the pharmacy, call can be transferred to refill team.   1. Which medications need to be refilled? (please list name of each medication and dose if known) amoxicillin (AMOXIL) 500 MG capsule   2. Which pharmacy/location (including street and city if local pharmacy) is medication to be sent to? [facility] [place], [place] - [address] Phone: [phone]  Fax: [phone]    3. Do they need a 30 day or 90 day supply?  12 day

## 2022-04-14 NOTE — Telephone Encounter (Signed)
Patient had teeth cleaned today and used the rest of his amoxicillin .   I will refill to [facility] in [place]

## 2022-05-08 DIAGNOSIS — I10 Essential (primary) hypertension: Secondary | ICD-10-CM | POA: Diagnosis not present

## 2022-05-08 DIAGNOSIS — R7303 Prediabetes: Secondary | ICD-10-CM | POA: Diagnosis not present

## 2022-05-08 DIAGNOSIS — Z125 Encounter for screening for malignant neoplasm of prostate: Secondary | ICD-10-CM | POA: Diagnosis not present

## 2022-05-11 ENCOUNTER — Other Ambulatory Visit: Payer: Self-pay | Admitting: Cardiology

## 2022-05-14 DIAGNOSIS — R7303 Prediabetes: Secondary | ICD-10-CM | POA: Diagnosis not present

## 2022-05-14 DIAGNOSIS — R944 Abnormal results of kidney function studies: Secondary | ICD-10-CM | POA: Diagnosis not present

## 2022-05-14 DIAGNOSIS — E782 Mixed hyperlipidemia: Secondary | ICD-10-CM | POA: Diagnosis not present

## 2022-05-14 DIAGNOSIS — D509 Iron deficiency anemia, unspecified: Secondary | ICD-10-CM | POA: Diagnosis not present

## 2022-05-14 DIAGNOSIS — I10 Essential (primary) hypertension: Secondary | ICD-10-CM | POA: Diagnosis not present

## 2022-05-14 DIAGNOSIS — I2510 Atherosclerotic heart disease of native coronary artery without angina pectoris: Secondary | ICD-10-CM | POA: Diagnosis not present

## 2022-05-14 DIAGNOSIS — Z0001 Encounter for general adult medical examination with abnormal findings: Secondary | ICD-10-CM | POA: Diagnosis not present

## 2022-05-25 DIAGNOSIS — R42 Dizziness and giddiness: Secondary | ICD-10-CM | POA: Diagnosis not present

## 2022-08-05 ENCOUNTER — Other Ambulatory Visit: Payer: Self-pay | Admitting: Cardiology

## 2022-08-27 ENCOUNTER — Other Ambulatory Visit (HOSPITAL_COMMUNITY): Payer: Self-pay | Admitting: Family Medicine

## 2022-08-27 DIAGNOSIS — R42 Dizziness and giddiness: Secondary | ICD-10-CM | POA: Diagnosis not present

## 2022-09-05 ENCOUNTER — Other Ambulatory Visit: Payer: Self-pay | Admitting: Cardiology

## 2022-09-09 ENCOUNTER — Ambulatory Visit (HOSPITAL_COMMUNITY)
Admission: RE | Admit: 2022-09-09 | Discharge: 2022-09-09 | Disposition: A | Discharge status: Home / Self Care | Payer: Medicare Other | Source: Ambulatory Visit | Attending: Family Medicine | Admitting: Family Medicine

## 2022-09-09 DIAGNOSIS — R42 Dizziness and giddiness: Secondary | ICD-10-CM | POA: Diagnosis not present

## 2022-09-20 NOTE — Progress Notes (Unsigned)
    Cardiology Office Note  Date: 09/20/2022   ID: HILLERY BHALLA, DOB 07-29-1945, MRN [id number]  History of Present Illness: Charles [name] is a 77 y.o. male last seen in November 2023.  Physical Exam: VS:  There were no vitals taken for this visit., BMI There is no height or weight on file to calculate BMI.  Wt Readings from Last 3 Encounters:  02/27/22 270 lb (122.5 kg)  10/15/21 265 lb (120.2 kg)  09/02/21 271 lb (122.9 kg)    General: Patient appears comfortable at rest. HEENT: Conjunctiva and lids normal, oropharynx clear with moist mucosa. Neck: Supple, no elevated JVP or carotid bruits, no thyromegaly. Lungs: Clear to auscultation, nonlabored breathing at rest. Cardiac: Regular rate and rhythm, no S3 or significant systolic murmur, no pericardial rub. Abdomen: Soft, nontender, no hepatomegaly, bowel sounds present, no guarding or rebound. Extremities: No pitting edema, distal pulses 2+. Skin: Warm and dry. Musculoskeletal: No kyphosis. Neuropsychiatric: Alert and oriented x3, affect grossly appropriate.  ECG:  An ECG dated 08/15/2021 was personally reviewed today and demonstrated:  Sinus rhythm with increased voltage, incomplete right bundle branch block, left anterior fascicular block.  Labwork: 10/13/2021: BUN 30; Creatinine, Ser 1.24; Potassium 3.8; Sodium 140     Component Value Date/Time   CHOL 110 05/10/2018 0409   TRIG 164 (H) 05/10/2018 0409   HDL 34 (L) 05/10/2018 0409   CHOLHDL 3.2 05/10/2018 0409   VLDL 33 05/10/2018 0409   LDLCALC 43 05/10/2018 0409   Other Studies Reviewed Today:  Echocardiogram 03/03/2022:  1. Left ventricular ejection fraction, by estimation, is 70 to 75%. The  left ventricle has hyperdynamic function. The left ventricle has no  regional wall motion abnormalities. Left ventricular diastolic parameters  are consistent with Grade I diastolic  dysfunction (impaired relaxation). The average left ventricular global  longitudinal  strain is -22.6 %. The global longitudinal strain is normal.   2. Right ventricular systolic function was not well visualized. The right  ventricular size is not well visualized. Tricuspid regurgitation signal is  inadequate for assessing PA pressure.   3. The mitral valve was not well visualized. Trivial mitral valve  regurgitation. No evidence of mitral stenosis.   4. The aortic valve was not well visualized. Aortic valve regurgitation  is trivial. No aortic stenosis is present. There is a 25 mm human  allograft aortic root graft valve present in the aortic position.   5. The inferior vena cava is normal in size with greater than 50%  respiratory variability, suggesting right atrial pressure of 3 mmHg.   Assessment and Plan:  1.  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  Also concurrently underwent CABG with SVG to PDA and repair of PFO.  Follow-up echocardiogram in November 2023 revealed LVEF 70 to 75%, stable aortic allograft with trivial aortic regurgitation and peak AV gradient 13 mmHg.  2.  Essential hypertension.  Disposition:  Follow up {follow up:15908}  Signed, Jonelle Sidle, M.D., F.A.C.C. [facility]

## 2022-09-21 ENCOUNTER — Encounter: Payer: Self-pay | Admitting: Cardiology

## 2022-09-21 ENCOUNTER — Ambulatory Visit: Payer: Medicare Other | Attending: Cardiology | Admitting: Cardiology

## 2022-09-21 VITALS — BP 130/80 | HR 60 | Ht 72.0 in | Wt 272.2 lb

## 2022-09-21 DIAGNOSIS — I25119 Atherosclerotic heart disease of native coronary artery with unspecified angina pectoris: Secondary | ICD-10-CM | POA: Diagnosis not present

## 2022-09-21 DIAGNOSIS — Z954 Presence of other heart-valve replacement: Secondary | ICD-10-CM

## 2022-09-21 NOTE — Patient Instructions (Addendum)

## 2022-10-12 DIAGNOSIS — D6869 Other thrombophilia: Secondary | ICD-10-CM | POA: Diagnosis not present

## 2022-10-14 DIAGNOSIS — K080 Exfoliation of teeth due to systemic causes: Secondary | ICD-10-CM | POA: Diagnosis not present

## 2022-10-20 DIAGNOSIS — K080 Exfoliation of teeth due to systemic causes: Secondary | ICD-10-CM | POA: Diagnosis not present

## 2022-10-30 NOTE — Therapy (Signed)
OUTPATIENT PHYSICAL THERAPY VESTIBULAR EVALUATION     Patient Name: Charles [name] MRN: [id number] DOB:October 27, 1945, 77 y.o., male Today's Date: 11/05/2022  END OF SESSION:  PT End of Session - 11/05/22 0904     Visit Number 1    Number of Visits 1    Authorization Type [employer] no vl; no auth;    PT Start Time 0905    PT Stop Time 0945    PT Time Calculation (min) 40 min    Activity Tolerance Patient tolerated treatment well    Behavior During Therapy [facility] for tasks assessed/performed             Past Medical History:  Diagnosis Date   Anemia    Aortic insufficiency    a. 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   SVG to RCA with EVH via right thigh   S/P patent foramen ovale closure 10/02/2016   Past Surgical History:  Procedure Laterality Date   AORTIC VALVE REPLACEMENT N/A 10/02/2016   Procedure: AORTIC ROOT REPLACEMENT  - using 25mm Allograft;  Surgeon: Purcell Nails, MD;  Location: [facility] OR;  Service: Open Heart Surgery;  Laterality: N/A;   COLONOSCOPY N/A 12/22/2017   Procedure: COLONOSCOPY;  Surgeon: Corbin Ade, MD;  Location: [facility] ENDO SUITE;  Service: Endoscopy;  Laterality: N/A;  2:00pm - refused to move up   CORONARY ARTERY BYPASS GRAFT N/A 10/02/2016   Procedure: CORONARY ARTERY BYPASS GRAFTING (CABG) x one, using right leg greater saphenous vein harvested endoscopically;  Surgeon: Purcell Nails, MD;  Location: [facility] OR;  Service:  Open Heart Surgery;  Laterality: N/A;   FALSE ANEURYSM REPAIR  10/02/2016   Procedure: REPAIR FALSE ANEURYSM of Aortic Root Resection and Grafting of Aortic Root;  Surgeon: Purcell Nails, MD;  Location: [facility] OR;  Service: Open Heart Surgery;;   HERNIA REPAIR Left    LEFT HEART CATH AND CORS/GRAFTS ANGIOGRAPHY N/A 10/07/2016   Procedure: Left Heart Cath and Cors/Grafts Angiography;  Surgeon: Kathleene Hazel, MD;  Location: [facility] INVASIVE CV LAB;  Service: Cardiovascular;  Laterality: N/A;   MULTIPLE EXTRACTIONS WITH ALVEOLOPLASTY N/A 08/13/2016   Procedure: Extraction of tooth #'s 2,4,14 with alveoloplasty;  Surgeon: Charlynne Pander, DDS;  Location: [facility] OR;  Service: Oral Surgery;  Laterality: N/A;   PATENT FORAMEN OVALE(PFO) CLOSURE N/A 10/02/2016   Procedure: PATENT FORAMEN OVALE (PFO) CLOSURE;  Surgeon: Purcell Nails, MD;  Location: [facility] OR;  Service: Open Heart Surgery;  Laterality: N/A;   RIGHT/LEFT HEART CATH AND CORONARY ANGIOGRAPHY N/A 08/03/2016   Procedure: Right/Left Heart Cath and Coronary Angiography;  Surgeon: Kathleene Hazel, MD;  Location: [facility] INVASIVE CV LAB;  Service: Cardiovascular;  Laterality: N/A;   TEE WITHOUT CARDIOVERSION N/A 07/07/2016   Procedure: TRANSESOPHAGEAL ECHOCARDIOGRAM (TEE);  Surgeon: Laurey Morale, MD;  Location: [facility] ENDOSCOPY;  Service: Cardiovascular;  Laterality: N/A;   TEE WITHOUT CARDIOVERSION N/A  10/02/2016   Procedure: TRANSESOPHAGEAL ECHOCARDIOGRAM (TEE);  Surgeon: Purcell Nails, MD;  Location: [facility] OR;  Service: Open Heart Surgery;  Laterality: N/A;   THORACIC AORTIC ANEURYSM REPAIR N/A 10/02/2016   Procedure: THORACIC ASCENDING ANEURYSM REPAIR  - using 25mm Allograft;  Reimplantation of Left Coronary Button;  Surgeon: Purcell Nails, MD;  Location[facility] OR;  Service: Open Heart Surgery;  Laterality: N/A;   Patient Active Problem List   Diagnosis Date Noted   Hyperlipidemia    Essential hypertension    Gastroesophageal reflux disease     Hyperglycemia    Chest pain 05/09/2018   Cellulitis 10/26/2017   CHF (congestive heart failure) (HCC)    S/P AVR    Acute blood loss anemia    Tachypnea    Ventricular tachycardia (HCC)    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   S/P CABG x 1 10/02/2016   S/P patent foramen ovale closure 10/02/2016   S/P aortic valve allograft 10/02/2016   Insomnia 09/27/2016   Acute diastolic CHF (congestive heart failure), NYHA class 4 (HCC) 09/25/2016   CHF (congestive heart failure), NYHA class II (HCC) 09/24/2016   Aortic valve disorder    Coronary artery disease involving native heart without angina pectoris    Second degree AV block, Mobitz type I 07/08/2016   Weight loss    Bacterial endocarditis    Protein-calorie malnutrition, severe 07/03/2016   Anemia 07/02/2016   H/O cardiac murmur 07/02/2016   Ascending aortic aneurysm (HCC) 07/02/2016   NSTEMI (non-ST elevated myocardial infarction) (HCC) 07/02/2016   Bicuspid aortic valve 07/02/2016   Aortic insufficiency 07/02/2016    PCP: Benita Stabile, MD REFERRING PROVIDER: Benita Stabile, MD  REFERRING DIAG: R42 (ICD-10-CM) - Dizziness and giddiness  THERAPY DIAG:  Dizziness and giddiness  Balance problem  ONSET DATE: 6 weeks since last episode  Rationale for Evaluation and Treatment: Rehabilitation  SUBJECTIVE:   SUBJECTIVE STATEMENT: Patient reports it may be more of a balance issue than a vertigo issue; "I am fine as long as I'm sitting down.  When It would happen I would feel off balance; no spinning"; symptoms kind of came and went; have not had any issue in 6 weeks Pt accompanied by: self  PERTINENT HISTORY: cardiac issues see above  PAIN:  Are you having pain? No  PRECAUTIONS: None     WEIGHT BEARING RESTRICTIONS: No  FALLS: Has patient fallen in last 6 months? No   PLOF: Independent  PATIENT GOALS: deal with balance issue  OBJECTIVE:   DIAGNOSTIC FINDINGS:   CLINICAL DATA:   Dizziness   EXAM: CT HEAD WITHOUT CONTRAST   TECHNIQUE: Contiguous axial images were obtained from the base of the skull through the vertex without intravenous contrast.   RADIATION DOSE REDUCTION: This exam was performed according to the departmental dose-optimization program which includes automated exposure control, adjustment of the mA and/or kV according to patient size and/or use of iterative reconstruction technique.   COMPARISON:  Head CT 07/01/2016   FINDINGS: Brain: No evidence of acute infarction, hemorrhage, hydrocephalus, extra-axial collection or mass lesion/mass effect.   Vascular: No hyperdense vessel or unexpected calcification.   Skull: Normal. Negative for fracture or focal lesion.   Sinuses/Orbits: No acute finding.   Other: None.   IMPRESSION: No acute intracranial abnormality.      COGNITION: Overall cognitive status: Within functional limits for tasks assessed   SENSATION: WFL   POSTURE:  rounded shoulders and forward head  Cervical  ROM:    Active AROM (deg) eval  Flexion full  Extension Limited by 60%   Right lateral flexion   Left lateral flexion   Right rotation 42  Left rotation 38  (Blank rows = not tested)  STRENGTH: UE MMT's grossly wfl  LOWER EXTREMITY MMT:   MMT Right eval Left eval  Hip flexion    Hip abduction    Hip adduction    Hip internal rotation    Hip external rotation    Knee flexion    Knee extension    Ankle dorsiflexion    Ankle plantarflexion    Ankle inversion    Ankle eversion    (Blank rows = not tested)  BED MOBILITY:  Sit to supine Complete Independence Supine to sit Min A  TRANSFERS: Assistive device utilized: None  Sit to stand: Complete Independence Stand to sit: Complete Independence Chair to chair: Complete Independence Floor:  not tested     PATIENT SURVEYS:  DHI 44 (moderate handicap)  VESTIBULAR ASSESSMENT:  GENERAL OBSERVATION: glasses; bifocals; hearing  aids   SYMPTOM BEHAVIOR:  Subjective history: see above  Non-Vestibular symptoms: tinnitus  Type of dizziness: Imbalance (Disequilibrium)  Frequency: now is rare; last episode about 6 weeks ago  Duration: 15-30 min  Aggravating factors: Induced by position change: activity and Induced by motion: activity in general  Relieving factors: no known relieving factors  Progression of symptoms: better  OCULOMOTOR EXAM:  Ocular Alignment: normal  Ocular ROM: No Limitations  Spontaneous Nystagmus: absent  Gaze-Induced Nystagmus: absent  Smooth Pursuits: intact  Saccades: intact  Convergence/Divergence: about 6 inches away from end of nose       POSITIONAL TESTING: Right Dix-Hallpike: no nystagmus Left Dix-Hallpike: no nystagmus Right Roll Test: no nystagmus Left Roll Test: no nystagmus  MOTION SENSITIVITY:  Motion Sensitivity Quotient Intensity: 0 = none, 1 = Lightheaded, 2 = Mild, 3 = Moderate, 4 = Severe, 5 = Vomiting  Intensity  1. Sitting to supine 0  2. Supine to L side   3. Supine to R side   4. Supine to sitting   5. L Hallpike-Dix 0  6. Up from L  0  7. R Hallpike-Dix 0  8. Up from R  0  9. Sitting, head tipped to L knee 0  10. Head up from L knee 0  11. Sitting, head tipped to R knee 0  12. Head up from R knee 0  13. Sitting head turns x5 0  14.Sitting head nods x5 0  15. In stance, 180 turn to L    16. In stance, 180 turn to R     OTHOSTATICS: not done  FUNCTIONAL GAIT: SLS right side 3"; left 4"   VESTIBULAR TREATMENT:                                                                                                   DATE: 11/05/22 physical therapy evaluation and HEP instruction   PATIENT EDUCATION: Education details: Patient educated on exam findings, POC, scope of PT, HEP. Person educated: Patient Education method: Explanation, Demonstration, and  Handouts Education comprehension: verbalized understanding, returned demonstration, verbal cues required,  and tactile cues required  HOME EXERCISE PROGRAM: Access Code: [email or web address] URL: [email or web address] Date: 11/05/2022 Prepared by: [facility] - Rehab  Exercises - Standing Single Leg Stance with Counter Support  - 2 x daily - 7 x weekly - 1 sets - 5 reps - 10 sed hold - Standing Tandem Balance with Counter Support  - 2 x daily - 7 x weekly - 1 sets - 5 reps - 10 sec hold - Heel Raises with Counter Support  - 2 x daily - 7 x weekly - 1 sets - 10 reps - Standing Hip Abduction with Counter Support  - 2 x daily - 7 x weekly - 1 sets - 10 reps - Seated Cervical Extension AROM  - 2 x daily - 7 x weekly - 1 sets - 10 reps - Seated Scapular Retraction  - 2 x daily - 7 x weekly - 1 sets - 10 reps GOALS: Goals reviewed with patient? No  SHORT TERM GOALS: Target date: 11/05/2022  patient will be independent with initial HEP  Baseline: Goal status: MET    ASSESSMENT:  CLINICAL IMPRESSION: Patient is a 77 y.o. male who was seen today for physical therapy evaluation and treatment for R42 (ICD-10-CM) - Dizziness and giddiness. Patient demonstrates  balance deficits and cervical extension limitations which are negatively impacting patient ability to perform ADLs and functional mobility tasks. Patient will benefit from skilled physical therapy services to address these deficits to improve level of function with ADLs, functional mobility tasks, and reduce risk for falls.  Patient will be a one time visit only for HEP.     OBJECTIVE IMPAIRMENTS: decreased activity tolerance, decreased balance, decreased ROM, hypomobility, and impaired perceived functional ability.   ACTIVITY LIMITATIONS: standing and locomotion level  PARTICIPATION LIMITATIONS: shopping, community activity, and yard work  [facility] POTENTIAL: Good  CLINICAL DECISION MAKING: Evolving/moderate complexity  EVALUATION COMPLEXITY: Moderate   PLAN:  PT FREQUENCY: 1x/week  PT DURATION: 1 week  PLANNED INTERVENTIONS:  Therapeutic exercises, Therapeutic activity, Neuromuscular re-education, Balance training, Gait training, Patient/Family education, Joint manipulation, Joint mobilization, Stair training, Orthotic/Fit training, DME instructions, Aquatic Therapy, Dry Needling, Electrical stimulation, Spinal manipulation, Spinal mobilization, Cryotherapy, Moist heat, Compression bandaging, scar mobilization, Splintting, Taping, Traction, Ultrasound, Ionotophoresis 4mg /ml Dexamethasone, and Manual therapy   PLAN FOR NEXT SESSION: one time visit only for HEP   9:58 AM, 11/05/22 Elek Holderness Small Kemyah Buser MPT [facility] [place] [phone] Ph:[phone]

## 2022-11-05 ENCOUNTER — Other Ambulatory Visit: Payer: Self-pay

## 2022-11-05 ENCOUNTER — Ambulatory Visit (HOSPITAL_COMMUNITY): Payer: Medicare Other | Attending: Internal Medicine

## 2022-11-05 DIAGNOSIS — R2689 Other abnormalities of gait and mobility: Secondary | ICD-10-CM | POA: Diagnosis not present

## 2022-11-05 DIAGNOSIS — R42 Dizziness and giddiness: Secondary | ICD-10-CM | POA: Diagnosis not present

## 2022-11-06 DIAGNOSIS — R7303 Prediabetes: Secondary | ICD-10-CM | POA: Diagnosis not present

## 2022-11-06 DIAGNOSIS — I10 Essential (primary) hypertension: Secondary | ICD-10-CM | POA: Diagnosis not present

## 2022-11-06 DIAGNOSIS — Z125 Encounter for screening for malignant neoplasm of prostate: Secondary | ICD-10-CM | POA: Diagnosis not present

## 2022-11-12 DIAGNOSIS — J449 Chronic obstructive pulmonary disease, unspecified: Secondary | ICD-10-CM | POA: Diagnosis not present

## 2022-11-12 DIAGNOSIS — E782 Mixed hyperlipidemia: Secondary | ICD-10-CM | POA: Diagnosis not present

## 2022-11-12 DIAGNOSIS — R7303 Prediabetes: Secondary | ICD-10-CM | POA: Diagnosis not present

## 2022-11-12 DIAGNOSIS — D509 Iron deficiency anemia, unspecified: Secondary | ICD-10-CM | POA: Diagnosis not present

## 2022-11-12 DIAGNOSIS — J302 Other seasonal allergic rhinitis: Secondary | ICD-10-CM | POA: Diagnosis not present

## 2022-11-12 DIAGNOSIS — R944 Abnormal results of kidney function studies: Secondary | ICD-10-CM | POA: Diagnosis not present

## 2022-11-12 DIAGNOSIS — I2510 Atherosclerotic heart disease of native coronary artery without angina pectoris: Secondary | ICD-10-CM | POA: Diagnosis not present

## 2022-11-12 DIAGNOSIS — I10 Essential (primary) hypertension: Secondary | ICD-10-CM | POA: Diagnosis not present

## 2022-11-16 ENCOUNTER — Ambulatory Visit: Payer: Medicare Other | Admitting: Neurology

## 2022-11-16 ENCOUNTER — Encounter: Payer: Self-pay | Admitting: Neurology

## 2022-11-16 VITALS — Ht 72.0 in | Wt 266.0 lb

## 2022-11-16 DIAGNOSIS — R42 Dizziness and giddiness: Secondary | ICD-10-CM

## 2022-11-16 NOTE — Patient Instructions (Signed)
Increase fluid intake  Continue current medications  Continue to follow up with PCP  Return as needed

## 2022-11-16 NOTE — Progress Notes (Signed)
[facility]  PATIENT: Charles [name] DOB: [birth year]  REQUESTING CLINICIAN: Lupita Raider, NP HISTORY FROM: Patient  REASON FOR VISIT: Dizziness    HISTORICAL  CHIEF COMPLAINT:  Chief Complaint  Patient presents with   New Patient (Initial Visit)    Rm13, alone Dizziness: struggled w/it approximately 2 months but no longer has spells of dizziness    HISTORY OF PRESENT ILLNESS:  This is 77 year old gentleman past medical history hypertension, coronary artery disease who is presenting with complaint of dizziness.  Patient reports a couple months ago, he was having episodes of dizziness, described as feeling unsteady when standing, denies any room spinning sensation.  He denies any nausea, no vomiting associated with the dizziness, no other complaints such as headaches, trouble walking.  He reports increasing his fluid intake and it seems like the dizziness improved.  The last time he experienced an episode was about a month and a half ago.  Since then he has been doing fine.  Currently denies any complaints prior to    OTHER MEDICAL CONDITIONS: Hypertension, CAD   REVIEW OF SYSTEMS: Full 14 system review of systems performed and negative with exception of: As noted in the HPI   ALLERGIES: Allergies  Allergen Reactions   Lisinopril Cough    HOME MEDICATIONS: Outpatient Medications Prior to Visit  Medication Sig Dispense Refill   amoxicillin (AMOXIL) 500 MG capsule TAKE 4 CAPSULES BY MOUTH 1 HOUR BEFORE PROCEDURE 12 capsule 0   aspirin EC 81 MG tablet Take 81 mg by mouth daily.     atorvastatin (LIPITOR) 40 MG tablet TAKE 1 TABLET(40 MG) BY MOUTH DAILY 90 tablet 3   fluticasone (FLONASE) 50 MCG/ACT nasal spray PLACE 2 SPRAYS INTO EACH NOSTRIL EVERY NIGHT AT BEDTIME 48 g 6   losartan (COZAAR) 100 MG tablet TAKE 1 TABLET(100 MG) BY MOUTH DAILY 90 tablet 3   metoprolol succinate (TOPROL-XL) 50 MG 24 hr tablet TAKE 1 TABLET(50 MG) BY MOUTH DAILY WITH OR  IMMEDIATELY FOLLOWING A MEAL 90 tablet 3   Multiple Vitamin (MULTIVITAMIN WITH MINERALS) TABS tablet Take 1 tablet by mouth daily.     nitroGLYCERIN (NITROSTAT) 0.4 MG SL tablet Place 1 tablet (0.4 mg total) under the tongue every 5 (five) minutes x 3 doses as needed for chest pain (if no relief after 3rd dose, proceed to the ED for an evaluation). 25 tablet 3   spironolactone (ALDACTONE) 25 MG tablet TAKE 1 TABLET(25 MG) BY MOUTH DAILY 90 tablet 3   levocetirizine (XYZAL) 5 MG tablet Take 5 mg by mouth daily.     No facility-administered medications prior to visit.    PAST MEDICAL HISTORY: Past Medical History:  Diagnosis Date   Anemia    Aortic insufficiency    a. 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   SVG to RCA with EVH via right thigh   S/P patent foramen ovale closure 10/02/2016  PAST SURGICAL HISTORY: Past Surgical History:  Procedure Laterality Date   AORTIC VALVE REPLACEMENT N/A 10/02/2016   Procedure: AORTIC ROOT REPLACEMENT  - using 25mm Allograft;  Surgeon: Purcell Nails, MD;  Location: [facility] OR;  Service: Open Heart Surgery;  Laterality: N/A;   COLONOSCOPY N/A 12/22/2017   Procedure: COLONOSCOPY;  Surgeon: Corbin Ade, MD;  Location: [facility] ENDO SUITE;  Service: Endoscopy;  Laterality: N/A;  2:00pm - refused to move up   CORONARY ARTERY BYPASS GRAFT N/A 10/02/2016   Procedure: CORONARY ARTERY BYPASS GRAFTING (CABG) x one, using right leg  greater saphenous vein harvested endoscopically;  Surgeon: Purcell Nails, MD;  Location: [facility] OR;  Service: Open Heart Surgery;  Laterality: N/A;   FALSE ANEURYSM REPAIR  10/02/2016   Procedure: REPAIR FALSE ANEURYSM of Aortic Root Resection and Grafting of Aortic Root;  Surgeon: Purcell Nails, MD;  Location: [facility] OR;  Service: Open Heart Surgery;;   HERNIA REPAIR Left    LEFT HEART CATH AND CORS/GRAFTS ANGIOGRAPHY N/A 10/07/2016   Procedure: Left Heart Cath and Cors/Grafts Angiography;  Surgeon: Kathleene Hazel, MD;  Location: [facility] INVASIVE CV LAB;  Service: Cardiovascular;  Laterality: N/A;   MULTIPLE EXTRACTIONS WITH ALVEOLOPLASTY N/A 08/13/2016   Procedure: Extraction of tooth #'s 2,4,14 with alveoloplasty;  Surgeon: Charlynne Pander, DDS;  Location: [facility] OR;  Service: Oral Surgery;  Laterality: N/A;   PATENT FORAMEN OVALE(PFO) CLOSURE N/A 10/02/2016   Procedure: PATENT FORAMEN OVALE (PFO) CLOSURE;  Surgeon: Purcell Nails, MD;  Location: [facility] OR;  Service: Open Heart Surgery;  Laterality: N/A;   RIGHT/LEFT HEART CATH AND CORONARY ANGIOGRAPHY N/A 08/03/2016   Procedure: Right/Left Heart Cath and Coronary Angiography;  Surgeon: Kathleene Hazel, MD;  Location: [facility] INVASIVE CV LAB;  Service: Cardiovascular;  Laterality: N/A;   TEE WITHOUT CARDIOVERSION N/A 07/07/2016   Procedure: TRANSESOPHAGEAL ECHOCARDIOGRAM (TEE);  Surgeon: Laurey Morale, MD;  Location: [facility] ENDOSCOPY;  Service: Cardiovascular;  Laterality: N/A;   TEE WITHOUT CARDIOVERSION N/A 10/02/2016   Procedure: TRANSESOPHAGEAL ECHOCARDIOGRAM (TEE);  Surgeon: Purcell Nails, MD;  Location: [facility] OR;  Service: Open Heart Surgery;  Laterality: N/A;   THORACIC AORTIC ANEURYSM REPAIR N/A 10/02/2016   Procedure: THORACIC ASCENDING ANEURYSM REPAIR  - using 25mm Allograft;  Reimplantation of Left Coronary Button;  Surgeon: Purcell Nails, MD;  Location: [facility] OR;  Service: Open Heart Surgery;  Laterality: N/A;    FAMILY HISTORY: Family  History  Problem Relation Age of Onset   Ovarian cancer Mother    Heart disease Father 45       Died of coronary thrombosis.     Colon cancer Neg Hx     SOCIAL HISTORY: Social History   Socioeconomic History   Marital status: Widowed    Spouse name: Not on file   Number of children: 3   Years of education: Not on file   Highest education level: Bachelor's degree (e.g., BA, AB, BS)  Occupational History   Not on file  Tobacco Use   Smoking status: Former    Current packs/day: 0.00    Types: Cigarettes    Quit date: 05/01/1991    Years since quitting: 31.5   Smokeless tobacco: Never   Tobacco comments:    Quit 30 years ago.    Vaping Use   Vaping status: Never Used  Substance and Sexual Activity   Alcohol use: Not Currently    Alcohol/week: 7.0 standard drinks of alcohol    Types: 7  Glasses of wine per week    Comment: 1 glass of wine per day   Drug use: No   Sexual activity: Not Currently  Other Topics Concern   Not on file  Social History Narrative   Not on file   Social Determinants of Health   Financial Resource Strain: Not on file  Food Insecurity: Not on file  Transportation Needs: Not on file  Physical Activity: Not on file  Stress: Not on file  Social Connections: Not on file  Intimate Partner Violence: Not on file    PHYSICAL EXAM  GENERAL EXAM/CONSTITUTIONAL: Vitals:  Vitals:   11/16/22 0825  Weight: 266 lb (120.7 kg)  Height: 6' (1.829 m)   Body mass index is 36.08 kg/m. Wt Readings from Last 3 Encounters:  11/16/22 266 lb (120.7 kg)  09/21/22 272 lb 3.2 oz (123.5 kg)  02/27/22 270 lb (122.5 kg)   Patient is in no distress; well developed, nourished and groomed; neck is supple  MUSCULOSKELETAL: Gait, strength, tone, movements noted in Neurologic exam below  NEUROLOGIC: MENTAL STATUS:      No data to display         awake, alert, oriented to person, place and time recent and remote memory intact normal attention and  concentration language fluent, comprehension intact, naming intact fund of knowledge appropriate  CRANIAL NERVE:  2nd, 3rd, 4th, 6th - Visual fields full to confrontation, extraocular muscles intact, no nystagmus 5th - facial sensation symmetric 7th - facial strength symmetric 8th - hearing intact 9th - palate elevates symmetrically, uvula midline 11th - shoulder shrug symmetric 12th - tongue protrusion midline  MOTOR:  normal bulk and tone, full strength in the BUE, BLE  SENSORY:  normal and symmetric to light touch  COORDINATION:  finger-nose-finger, fine finger movements normal  REFLEXES:  deep tendon reflexes present and symmetric  GAIT/STATION:  normal   DIAGNOSTIC DATA (LABS, IMAGING, TESTING) - I reviewed patient records, labs, notes, testing and imaging myself where available.  Lab Results  Component Value Date   WBC 9.9 05/09/2018   HGB 13.0 05/09/2018   HCT 41.9 05/09/2018   MCV 86.7 05/09/2018   PLT 203 05/09/2018      Component Value Date/Time   NA 140 10/13/2021 1411   K 3.8 10/13/2021 1411   CL 108 10/13/2021 1411   CO2 26 10/13/2021 1411   GLUCOSE 142 (H) 10/13/2021 1411   BUN 30 (H) 10/13/2021 1411   CREATININE 1.24 10/13/2021 1411   CALCIUM 8.5 (L) 10/13/2021 1411   PROT 6.9 05/09/2018 0712   ALBUMIN 3.5 05/09/2018 0712   AST 19 05/09/2018 0712   ALT 16 05/09/2018 0712   ALKPHOS 88 05/09/2018 0712   BILITOT 0.6 05/09/2018 0712   GFRNONAA >60 10/13/2021 1411   GFRAA >60 05/09/2018 0712   Lab Results  Component Value Date   CHOL 110 05/10/2018   HDL 34 (L) 05/10/2018   LDLCALC 43 05/10/2018   TRIG 164 (H) 05/10/2018   CHOLHDL 3.2 05/10/2018   Lab Results  Component Value Date   HGBA1C 5.9 (H) 05/09/2018   Lab Results  Component Value Date   VITAMINB12 636 07/04/2016   Lab Results  Component Value Date   TSH 1.300 07/01/2016    Head CT 09/09/2022 No acute intracranial abnormality     ASSESSMENT AND PLAN  77 y.o. year  old male with history of hypertension, CAD who is presenting with complaint of dizziness, currently resolved.  Last episode was a month  and a half ago.  Dizziness described as feeling unsteady when walking at that time.  Since he is doing better, no need for medication, his most recent head CT in May was normal, did not show any intracranial abnormality.  Normal orthostatic vitals. Advised patient to continue increasing his fluid intake, exercise at least 20 minutes 5 times a day and continue to follow with PCP.  Return if worse or any other concerns.  He voiced understanding.   1. Dizziness      Patient Instructions  Increase fluid intake  Continue current medications  Continue to follow up with PCP  Return as needed   No orders of the defined types were placed in this encounter.   No orders of the defined types were placed in this encounter.   Return if symptoms worsen or fail to improve.    Windell Norfolk, MD 11/16/2022, 9:02 AM  [facility] [address], [address] [place], [place] [address] [phone]

## 2022-12-22 DIAGNOSIS — H5203 Hypermetropia, bilateral: Secondary | ICD-10-CM | POA: Diagnosis not present

## 2022-12-23 DIAGNOSIS — L682 Localized hypertrichosis: Secondary | ICD-10-CM | POA: Diagnosis not present

## 2022-12-23 DIAGNOSIS — Z1330 Encounter for screening examination for mental health and behavioral disorders, unspecified: Secondary | ICD-10-CM | POA: Diagnosis not present

## 2022-12-28 ENCOUNTER — Other Ambulatory Visit: Payer: Self-pay | Admitting: Cardiology

## 2022-12-29 ENCOUNTER — Telehealth: Payer: Self-pay | Admitting: Cardiology

## 2022-12-29 MED ORDER — METOPROLOL SUCCINATE ER 50 MG PO TB24: 50.0000 mg | ORAL_TABLET | Freq: Every day | ORAL | 0 refills | Status: DC

## 2022-12-29 NOTE — Telephone Encounter (Signed)
*  STAT* If patient is at the pharmacy, call can be transferred to refill team.   1. Which medications need to be refilled? (please list name of each medication and dose if known) metoprolol succinate (TOPROL-XL) 50 MG 24 hr tablet  2. Which pharmacy/location (including street and city if local pharmacy) is medication to be sent to? [id number] - [place], [place] - [address]   3. Do they need a 30 day or 90 day supply?  90 day supply

## 2022-12-29 NOTE — Telephone Encounter (Signed)
filled

## 2023-04-02 ENCOUNTER — Encounter: Payer: Self-pay | Admitting: Cardiology

## 2023-04-02 ENCOUNTER — Ambulatory Visit: Payer: Medicare Other | Attending: Cardiology | Admitting: Cardiology

## 2023-04-02 VITALS — BP 132/86 | HR 59 | Ht 73.0 in | Wt 273.8 lb

## 2023-04-02 DIAGNOSIS — I10 Essential (primary) hypertension: Secondary | ICD-10-CM | POA: Diagnosis not present

## 2023-04-02 DIAGNOSIS — R0602 Shortness of breath: Secondary | ICD-10-CM | POA: Diagnosis not present

## 2023-04-02 DIAGNOSIS — Z954 Presence of other heart-valve replacement: Secondary | ICD-10-CM

## 2023-04-02 DIAGNOSIS — I25119 Atherosclerotic heart disease of native coronary artery with unspecified angina pectoris: Secondary | ICD-10-CM | POA: Diagnosis not present

## 2023-04-02 DIAGNOSIS — E785 Hyperlipidemia, unspecified: Secondary | ICD-10-CM

## 2023-04-02 DIAGNOSIS — Z79899 Other long term (current) drug therapy: Secondary | ICD-10-CM

## 2023-04-02 NOTE — Progress Notes (Signed)
    Cardiology Office Note  Date: 04/02/2023   ID: Charles [name], DOB [birth year], MRN [id number]  History of Present Illness: Charles [name] is a 77 y.o. male last seen in June.  He is here for a follow-up visit.  He states that he has had intermittent shortness of breath, not specifically exertional, also some cough and mucus production.  No fevers or chills.  No orthopnea or PND.  States that he feels like he suddenly takes a deep breath almost involuntarily.  No wheezing.  No reported worsening leg edema, although he does have peripheral edema evident on examination today.  I reviewed his medications.  Current cardiac regimen includes aspirin, Lipitor, Cozaar, Toprol-XL, Aldactone, and as needed nitroglycerin.  He states that he is mostly consistent although may miss a day each week.  No nitroglycerin use.  His last echocardiogram was in November 2023.  He has not had any recent lab work per PCP.  Physical Exam: VS:  BP 132/86 (BP Location: Left Arm, Cuff Size: Normal)   Pulse (!) 59   Ht 6\' 1"  (1.854 m)   Wt 273 lb 12.8 oz (124.2 kg)   SpO2 96%   BMI 36.12 kg/m , BMI Body mass index is 36.12 kg/m.  Wt Readings from Last 3 Encounters:  04/02/23 273 lb 12.8 oz (124.2 kg)  11/16/22 266 lb (120.7 kg)  09/21/22 272 lb 3.2 oz (123.5 kg)    General: Patient appears comfortable at rest. HEENT: Conjunctiva and lids normal. Neck: Supple, no elevated JVP or carotid bruits. Lungs: Scattered rhonchi and dry crackles mid to lower lung zones. Cardiac: Regular rate and rhythm, no S3, 2/6 systolic murmur, no diastolic murmur. Extremities: 1-2+ leg edema and venous stasis..  ECG:  An ECG dated 09/21/2022 was personally reviewed today and demonstrated:  Sinus rhythm with prolonged PR interval, right bundle branch block, left anterior fascicular block.  Labwork:  No interval lab work for review today.  Other Studies Reviewed Today:  No interval cardiac testing for review  today.  Assessment and Plan:  1.  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  Also concurrently underwent CABG with SVG to PDA and repair of PFO.  Follow-up echocardiogram in November 2023 revealed LVEF 70 to 75%, stable aortic allograft with trivial aortic regurgitation and peak AV gradient 13 mmHg.  Plan to update echocardiogram in light of his shortness of breath, no significant change in cardiac murmur on examination however.  He does not describe any angina.  For now continue aspirin and Lipitor.  Update FLP.  2.  Shortness of breath with intermittent cough and mucus production.  Lung examination abnormal in comparison to his baseline.  He does not describe any fevers or chills.  He is not hypoxic on room air.  Plan to start with PA and lateral chest x-ray.  Echocardiogram also being updated as discussed above.   2.  Primary hypertension.  Currently on Cozaar, Aldactone, and Toprol-XL.  Check BMET.  Disposition:  Follow up  3 months.  Signed, Jonelle Sidle, M.D., F.A.C.C. [facility] at [facility]

## 2023-04-02 NOTE — Patient Instructions (Addendum)
Medication Instructions:  Your physician recommends that you continue on your current medications as directed. Please refer to the Current Medication list given to you today.  Labwork: Your physician recommends that you return for a FASTING lipid profile & BMET. Please do not eat or drink for at least 8 hours when you have this done. You may take your medications that morning with a sip of water. [employer] ([address] [place])  Testing/Procedures: Your physician has requested that you have an echocardiogram. Echocardiography is a painless test that uses sound waves to create images of your heart. It provides your doctor with information about the size and shape of your heart and how well your heart's chambers and valves are working. This procedure takes approximately one hour. There are no restrictions for this procedure. Please do NOT wear cologne, perfume, aftershave, or lotions (deodorant is allowed). Please arrive 15 minutes prior to your appointment time.  Please note: We ask at that you not bring children with you during ultrasound (echo/ vascular) testing. Due to room size and safety concerns, children are not allowed in the ultrasound rooms during exams. Our front office staff cannot provide observation of children in our lobby area while testing is being conducted. An adult accompanying a patient to their appointment will only be allowed in the ultrasound room at the discretion of the ultrasound technician under special circumstances. We apologize for any inconvenience. A chest x-ray takes a picture of the organs and structures inside the chest, including the heart, lungs, and blood vessels. This test can show several things, including, whether the heart is enlarges; whether fluid is building up in the lungs; and whether pacemaker / defibrillator leads are still in place.  Follow-Up: Your physician recommends that you schedule a follow-up appointment in: 3 months  Any Other Special  Instructions Will Be Listed Below (If Applicable).  If you need a refill on your cardiac medications before your next appointment, please call your pharmacy.

## 2023-04-08 ENCOUNTER — Other Ambulatory Visit: Payer: Medicare Other

## 2023-04-22 ENCOUNTER — Ambulatory Visit (HOSPITAL_COMMUNITY)
Admission: RE | Admit: 2023-04-22 | Discharge: 2023-04-22 | Disposition: A | Discharge status: Home / Self Care | Payer: Medicare Other | Source: Ambulatory Visit | Attending: Cardiology | Admitting: Cardiology

## 2023-04-22 DIAGNOSIS — R0602 Shortness of breath: Secondary | ICD-10-CM

## 2023-04-22 LAB — ECHOCARDIOGRAM COMPLETE
AR max vel: 2.07 cm2
AV Area VTI: 2.26 cm2
AV Area mean vel: 2.07 cm2
AV Mean grad: 3.8 mm[Hg]
AV Peak grad: 7.8 mm[Hg]
Ao pk vel: 1.39 m/s
Area-P 1/2: 3.03 cm2
S' Lateral: 3.5 cm

## 2023-04-22 NOTE — Progress Notes (Signed)
*  PRELIMINARY RESULTS* Echocardiogram 2D Echocardiogram has been performed.  Charles [name] 04/22/2023, 11:24 AM

## 2023-05-07 ENCOUNTER — Other Ambulatory Visit (HOSPITAL_COMMUNITY)
Admission: RE | Admit: 2023-05-07 | Discharge: 2023-05-07 | Disposition: A | Discharge status: Home / Self Care | Payer: Medicare Other | Source: Ambulatory Visit | Attending: Cardiology | Admitting: Cardiology

## 2023-05-07 ENCOUNTER — Telehealth: Payer: Self-pay | Admitting: *Deleted

## 2023-05-07 ENCOUNTER — Ambulatory Visit (HOSPITAL_COMMUNITY)
Admission: RE | Admit: 2023-05-07 | Discharge: 2023-05-07 | Disposition: A | Discharge status: Home / Self Care | Payer: Medicare Other | Source: Ambulatory Visit | Attending: Cardiology | Admitting: Cardiology

## 2023-05-07 DIAGNOSIS — Z951 Presence of aortocoronary bypass graft: Secondary | ICD-10-CM | POA: Insufficient documentation

## 2023-05-07 DIAGNOSIS — E785 Hyperlipidemia, unspecified: Secondary | ICD-10-CM | POA: Diagnosis not present

## 2023-05-07 DIAGNOSIS — R0602 Shortness of breath: Secondary | ICD-10-CM | POA: Diagnosis not present

## 2023-05-07 DIAGNOSIS — Z79899 Other long term (current) drug therapy: Secondary | ICD-10-CM | POA: Insufficient documentation

## 2023-05-07 LAB — BASIC METABOLIC PANEL
Anion gap: 9 (ref 5–15)
BUN: 26 mg/dL — ABNORMAL HIGH (ref 8–23)
CO2: 24 mmol/L (ref 22–32)
Calcium: 8.9 mg/dL (ref 8.9–10.3)
Chloride: 107 mmol/L (ref 98–111)
Creatinine, Ser: 1.04 mg/dL (ref 0.61–1.24)
GFR, Estimated: >60 mL/min (ref >60)
Glucose, Bld: 104 mg/dL — ABNORMAL HIGH (ref 70–99)
Potassium: 4 mmol/L (ref 3.5–5.1)
Sodium: 140 mmol/L (ref 135–145)

## 2023-05-07 LAB — LIPID PANEL
Cholesterol: 141 mg/dL (ref 0–200)
HDL: 38 mg/dL — ABNORMAL LOW (ref >40)
LDL Cholesterol: 79 mg/dL (ref 0–99)
Total CHOL/HDL Ratio: 3.7 {ratio}
Triglycerides: 119 mg/dL (ref <150)
VLDL: 24 mg/dL (ref 0–40)

## 2023-05-07 NOTE — Telephone Encounter (Signed)
-----   Message from Nona Dell sent at 05/07/2023  2:27 PM EST ----- Results reviewed.  Lipid panel shows LDL 79, HDL 38.  Check on compliance with Lipitor 40 mg daily.  If he has been consistent, suggest increasing to 80 mg daily to get more optimal lipid control.

## 2023-05-07 NOTE — Telephone Encounter (Signed)
Informed and confirmed that he is compliant with taking atorvastatin 40 mg daily. Reports on an average, he misses one pill per week. Will forward to provider to verify if dose should be increased based on missing one dose per week.

## 2023-05-10 MED ORDER — ATORVASTATIN CALCIUM 80 MG PO TABS: 80.0000 mg | ORAL_TABLET | Freq: Every day | ORAL | 3 refills | Status: AC

## 2023-05-10 NOTE — Telephone Encounter (Signed)
Patient informed and verbalized understanding of plan.

## 2023-05-17 DIAGNOSIS — D509 Iron deficiency anemia, unspecified: Secondary | ICD-10-CM | POA: Diagnosis not present

## 2023-05-17 DIAGNOSIS — R7303 Prediabetes: Secondary | ICD-10-CM | POA: Diagnosis not present

## 2023-05-17 DIAGNOSIS — E782 Mixed hyperlipidemia: Secondary | ICD-10-CM | POA: Diagnosis not present

## 2023-05-17 DIAGNOSIS — I10 Essential (primary) hypertension: Secondary | ICD-10-CM | POA: Diagnosis not present

## 2023-05-17 DIAGNOSIS — I2510 Atherosclerotic heart disease of native coronary artery without angina pectoris: Secondary | ICD-10-CM | POA: Diagnosis not present

## 2023-05-17 DIAGNOSIS — R944 Abnormal results of kidney function studies: Secondary | ICD-10-CM | POA: Diagnosis not present

## 2023-05-22 DIAGNOSIS — G4730 Sleep apnea, unspecified: Secondary | ICD-10-CM | POA: Diagnosis not present

## 2023-06-08 ENCOUNTER — Other Ambulatory Visit: Payer: Self-pay | Admitting: Cardiology

## 2023-07-06 ENCOUNTER — Other Ambulatory Visit: Payer: Self-pay | Admitting: *Deleted

## 2023-07-06 MED ORDER — METOPROLOL SUCCINATE ER 50 MG PO TB24: 50.0000 mg | ORAL_TABLET | Freq: Every day | ORAL | 1 refills | Status: DC

## 2023-07-12 ENCOUNTER — Ambulatory Visit: Payer: Medicare Other | Admitting: Cardiology

## 2023-07-14 DIAGNOSIS — G4700 Insomnia, unspecified: Secondary | ICD-10-CM | POA: Diagnosis not present

## 2023-07-14 DIAGNOSIS — J449 Chronic obstructive pulmonary disease, unspecified: Secondary | ICD-10-CM | POA: Diagnosis not present

## 2023-07-14 DIAGNOSIS — R059 Cough, unspecified: Secondary | ICD-10-CM | POA: Diagnosis not present

## 2023-07-14 DIAGNOSIS — J302 Other seasonal allergic rhinitis: Secondary | ICD-10-CM | POA: Diagnosis not present

## 2023-08-19 ENCOUNTER — Ambulatory Visit: Attending: Cardiology | Admitting: Cardiology

## 2023-08-19 ENCOUNTER — Encounter: Payer: Self-pay | Admitting: Cardiology

## 2023-08-19 VITALS — BP 144/76 | HR 68 | Ht 72.0 in | Wt 274.2 lb

## 2023-08-19 DIAGNOSIS — I25119 Atherosclerotic heart disease of native coronary artery with unspecified angina pectoris: Secondary | ICD-10-CM

## 2023-08-19 DIAGNOSIS — Z954 Presence of other heart-valve replacement: Secondary | ICD-10-CM

## 2023-08-19 DIAGNOSIS — I10 Essential (primary) hypertension: Secondary | ICD-10-CM | POA: Diagnosis not present

## 2023-08-19 NOTE — Progress Notes (Signed)
 Cardiology Office Note  Date: 08/19/2023   ID: DAMAREON WOODLIFF, DOB 21-Jul-1945, MRN [id number]  History of Present Illness: Charles [name] is a 78 y.o. male last seen in December 2024.  He is here for a routine visit.  States that he is doing reasonably well, stable NYHA class II dyspnea with resolution of prior respiratory symptoms, no exertional chest pain, no palpitations or syncope.  He does not report any orthopnea or PND, no increasing leg swelling.  We went over his medications.  No change in present regimen or interval nitroglycerin  use.  He continues to follow with Dr. Del Favia, I reviewed his interval lab work.  I reviewed his ECG today which shows sinus rhythm with prolonged PR interval, right bundle branch block, and left anterior fascicular block.  Physical Exam: VS:  BP (!) 144/76   Pulse 68   Ht 6' (1.829 m)   Wt 274 lb 3.2 oz (124.4 kg)   SpO2 94%   BMI 37.19 kg/m , BMI Body mass index is 37.19 kg/m.  Wt Readings from Last 3 Encounters:  08/19/23 274 lb 3.2 oz (124.4 kg)  04/02/23 273 lb 12.8 oz (124.2 kg)  11/16/22 266 lb (120.7 kg)    General: Patient appears comfortable at rest. HEENT: Conjunctiva and lids normal. Neck: Supple, no elevated JVP or carotid bruits. Lungs: Clear to auscultation, nonlabored breathing at rest. Cardiac: Regular rate and rhythm, no S3, 2/6 systolic murmur, no pericardial rub. Extremities: 1+ lower leg edema.  ECG:  An ECG dated 09/21/2022 was personally reviewed today and demonstrated:  Sinus rhythm with prolonged PR interval, right bundle branch block, left anterior fascicular block.  Labwork: 05/07/2023: BUN 26; Creatinine, Ser 1.04; Potassium 4.0; Sodium 140     Component Value Date/Time   CHOL 141 05/07/2023 0916   TRIG 119 05/07/2023 0916   HDL 38 (L) 05/07/2023 0916   CHOLHDL 3.7 05/07/2023 0916   VLDL 24 05/07/2023 0916   LDLCALC 79 05/07/2023 0916   Other Studies Reviewed Today:  Echocardiogram 04/22/2023:  1. Left  ventricular ejection fraction, by estimation, is 60 to 65%. The  left ventricle has normal function. The left ventricle has no regional  wall motion abnormalities. There is moderate left ventricular hypertrophy.  Left ventricular diastolic  parameters are consistent with Grade I diastolic dysfunction (impaired  relaxation).   2. Right ventricular systolic function is normal. The right ventricular  size is normal. Tricuspid regurgitation signal is inadequate for assessing  PA pressure.   3. The mitral valve is normal in structure. No evidence of mitral valve  regurgitation. No evidence of mitral stenosis.   4. The aortic valve was not well visualized. Aortic valve regurgitation  is not visualized. No aortic stenosis is present.   5. The inferior vena cava is normal in size with greater than 50%  respiratory variability, suggesting right atrial pressure of 3 mmHg.   Assessment and Plan:  1.  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  Follow-up echocardiogram in January revealed LVEF 60 to 65% with no significant aortic regurgitation and a mean AV gradient 3.8 mmHg.  Continue aspirin  81 mg daily.  2.  CAD status post CABG with SVG to PDA and repair of PFO in  2018.  No active angina.  Continue aspirin  81 mg daily, Lipitor  80 mg daily, and as needed nitroglycerin .  Would be candidate for SGLT2 inhibitor, we discussed this today.   3.  Primary hypertension.  I have asked him to check blood pressure periodically at home.  Currently on Cozaar  100 mg daily, Toprol -XL 50 mg daily, and Aldactone  25 mg daily.  Keep follow-up with Dr. Del Favia.  Disposition:  Follow up 6 months.  Signed, Gerard Knight, M.D.,  F.A.C.C. [facility] at [facility]

## 2023-08-19 NOTE — Patient Instructions (Signed)
 Medication Instructions:  Your physician recommends that you continue on your current medications as directed. Please refer to the Current Medication list given to you today.   Labwork: None today  Testing/Procedures: None today  Follow-Up: 6 months  Any Other Special Instructions Will Be Listed Below (If Applicable).  If you need a refill on your cardiac medications before your next appointment, please call your pharmacy.

## 2023-09-16 DIAGNOSIS — H2589 Other age-related cataract: Secondary | ICD-10-CM | POA: Diagnosis not present

## 2023-09-16 DIAGNOSIS — H25813 Combined forms of age-related cataract, bilateral: Secondary | ICD-10-CM | POA: Diagnosis not present

## 2023-09-29 DIAGNOSIS — I10 Essential (primary) hypertension: Secondary | ICD-10-CM | POA: Diagnosis not present

## 2023-09-29 DIAGNOSIS — E782 Mixed hyperlipidemia: Secondary | ICD-10-CM | POA: Diagnosis not present

## 2023-09-29 DIAGNOSIS — D509 Iron deficiency anemia, unspecified: Secondary | ICD-10-CM | POA: Diagnosis not present

## 2023-09-29 DIAGNOSIS — R944 Abnormal results of kidney function studies: Secondary | ICD-10-CM | POA: Diagnosis not present

## 2023-09-29 DIAGNOSIS — R7303 Prediabetes: Secondary | ICD-10-CM | POA: Diagnosis not present

## 2023-10-06 DIAGNOSIS — I10 Essential (primary) hypertension: Secondary | ICD-10-CM | POA: Diagnosis not present

## 2023-10-06 DIAGNOSIS — R7303 Prediabetes: Secondary | ICD-10-CM | POA: Diagnosis not present

## 2023-10-06 DIAGNOSIS — E782 Mixed hyperlipidemia: Secondary | ICD-10-CM | POA: Diagnosis not present

## 2023-10-06 DIAGNOSIS — Z0001 Encounter for general adult medical examination with abnormal findings: Secondary | ICD-10-CM | POA: Diagnosis not present

## 2023-10-06 DIAGNOSIS — R944 Abnormal results of kidney function studies: Secondary | ICD-10-CM | POA: Diagnosis not present

## 2023-10-06 DIAGNOSIS — D509 Iron deficiency anemia, unspecified: Secondary | ICD-10-CM | POA: Diagnosis not present

## 2023-10-11 ENCOUNTER — Other Ambulatory Visit: Payer: Self-pay | Admitting: Cardiology

## 2023-10-11 DIAGNOSIS — H2513 Age-related nuclear cataract, bilateral: Secondary | ICD-10-CM | POA: Diagnosis not present

## 2023-10-11 DIAGNOSIS — H35372 Puckering of macula, left eye: Secondary | ICD-10-CM | POA: Diagnosis not present

## 2023-10-11 DIAGNOSIS — I10 Essential (primary) hypertension: Secondary | ICD-10-CM | POA: Diagnosis not present

## 2023-10-11 DIAGNOSIS — H2511 Age-related nuclear cataract, right eye: Secondary | ICD-10-CM | POA: Diagnosis not present

## 2023-10-11 DIAGNOSIS — H25043 Posterior subcapsular polar age-related cataract, bilateral: Secondary | ICD-10-CM | POA: Diagnosis not present

## 2023-12-08 ENCOUNTER — Other Ambulatory Visit: Payer: Self-pay | Admitting: Cardiology

## 2023-12-30 DIAGNOSIS — H2511 Age-related nuclear cataract, right eye: Secondary | ICD-10-CM | POA: Diagnosis not present

## 2023-12-31 DIAGNOSIS — H25042 Posterior subcapsular polar age-related cataract, left eye: Secondary | ICD-10-CM | POA: Diagnosis not present

## 2023-12-31 DIAGNOSIS — H2512 Age-related nuclear cataract, left eye: Secondary | ICD-10-CM | POA: Diagnosis not present

## 2023-12-31 DIAGNOSIS — H25012 Cortical age-related cataract, left eye: Secondary | ICD-10-CM | POA: Diagnosis not present

## 2024-01-13 DIAGNOSIS — H25012 Cortical age-related cataract, left eye: Secondary | ICD-10-CM | POA: Diagnosis not present

## 2024-01-13 DIAGNOSIS — H2512 Age-related nuclear cataract, left eye: Secondary | ICD-10-CM | POA: Diagnosis not present

## 2024-01-13 DIAGNOSIS — H25042 Posterior subcapsular polar age-related cataract, left eye: Secondary | ICD-10-CM | POA: Diagnosis not present

## 2024-01-29 ENCOUNTER — Other Ambulatory Visit: Payer: Self-pay | Admitting: Cardiology

## 2024-02-01 DIAGNOSIS — R7303 Prediabetes: Secondary | ICD-10-CM | POA: Diagnosis not present

## 2024-02-01 DIAGNOSIS — E782 Mixed hyperlipidemia: Secondary | ICD-10-CM | POA: Diagnosis not present

## 2024-02-01 DIAGNOSIS — R944 Abnormal results of kidney function studies: Secondary | ICD-10-CM | POA: Diagnosis not present

## 2024-02-01 DIAGNOSIS — D509 Iron deficiency anemia, unspecified: Secondary | ICD-10-CM | POA: Diagnosis not present

## 2024-02-09 DIAGNOSIS — E782 Mixed hyperlipidemia: Secondary | ICD-10-CM | POA: Diagnosis not present

## 2024-02-09 DIAGNOSIS — R7303 Prediabetes: Secondary | ICD-10-CM | POA: Diagnosis not present

## 2024-02-09 DIAGNOSIS — D509 Iron deficiency anemia, unspecified: Secondary | ICD-10-CM | POA: Diagnosis not present

## 2024-02-09 DIAGNOSIS — R944 Abnormal results of kidney function studies: Secondary | ICD-10-CM | POA: Diagnosis not present

## 2024-04-17 NOTE — Progress Notes (Signed)
 " Cardiology Office Note:  .   Date:  04/18/2024  ID:  Charles [name], DOB 04/02/1946, MRN [id number] PCP: Shona Norleen PEDLAR, MD  [facility] Providers Cardiologist:  Jayson Sierras, MD {  History of Present Illness: .   Charles [name] is a 79 y.o. male  with PMHx of severe aortic regurgitation and bacterial endocarditis, CAD, HTN, HLD who reports to [facility] office for 6 month follow up.   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  ECHO 04/2023: no significant aortic regurgitation and a mean AV gradient 3.8 mmHg  CAD  s/p CABG with SVG to PDA and repair of PFO in 2018.  ECHO 04/2023: EF 60-65%, moderate LVH, G1DD  Last seen in heartcare 08/2023 by Dr. Sierras for follow up. Reported stable dyspnea, but otherwise doing well. Discussed considering SGTL2i but not started. Continued on ASA 81 mg daily, Lipitor  80 mg daily, NTG prn, Losartan  100 mg daily, Toprol  XL 50 mg daily, Spironolactone  25 mg daily.    Today, the patient reports recurrent dizziness beginning in the morning upon rising and persisting throughout the day. He notes similar symptoms in the past that were evaluated and attributed to balance issues, which improved after balance rehabilitation; however, symptoms have now returned. Although he denies lower extremity swelling, 1+ bilateral pitting edema is noted on exam.  He denies chest pain, shortness of breath, palpitations, syncope, presyncope, orthopnea, PND, significant weight changes, acute bleeding, or claudication. He reports medication compliance.  He remains physically active, attending the [employer] 4-5 times per week for approximately one hour of  cardio and weight training without limitation. He denies tobacco, alcohol, or illicit drug use and reports no recent hospitalizations or emergency department visits.  ROS: 10 point review of system has been reviewed and considered negative except ones been listed in the HPI.   Studies Reviewed: SABRA    Cath 2018 Prox Cx to Mid Cx lesion, 30 %stenosed. Mid LAD lesion, 20 %stenosed. Ost 2nd Diag lesion, 20 %stenosed. Dist LAD lesion, 40 %stenosed. Ost RCA lesion, 100 %stenosed. SVG graft was visualized by angiography and is very large and anatomically normal.   1. 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 Patent SVG to PDA. The entire RCA fills from the patent vein graft.  3. Mild disease in the Circumflex and LAD. No change since last cath.  4. Normal LVEDP   Recommendations: No further ischemic workup  Echocardiogram 04/22/2023:  1. Left ventricular ejection fraction, by estimation, is 60 to 65%. The  left ventricle has normal function. The left ventricle has no regional  wall motion abnormalities. There is moderate left ventricular hypertrophy.  Left ventricular diastolic  parameters are consistent with Grade I diastolic dysfunction (impaired  relaxation).   2. Right ventricular systolic function is normal. The right ventricular  size is normal. Tricuspid regurgitation signal is inadequate for assessing  PA pressure.   3. The mitral valve is normal in structure. No evidence of mitral valve  regurgitation. No evidence of mitral stenosis.   4. The aortic valve was not well visualized. Aortic valve regurgitation  is not visualized. No aortic stenosis is present.   5. The inferior  vena cava is normal in size with greater than 50%  respiratory variability, suggesting right atrial pressure of 3 mmHg.   CV Studies: Cardiac studies reviewed are outlined and summarized above. Otherwise please see EMR for full report.   Physical Exam:   VS:   BP 108/70   Pulse 87   Ht 6' (1.829 m)   Wt 279 lb 9.6 oz (126.8 kg)   SpO2 91%   BMI 37.92 kg/m    Wt Readings from Last 3 Encounters:  04/18/24 279 lb 9.6 oz (126.8 kg)  08/19/23 274 lb 3.2 oz (124.4 kg)  04/02/23 273 lb 12.8 oz (124.2 kg)    GEN: Well nourished, well developed in no acute distress while sitting in chair.  NECK: No JVD; No carotid bruits CARDIAC: RRR, no murmurs, rubs, gallops RESPIRATORY:  Clear to auscultation without rales, wheezing or rhonchi  ABDOMEN: Soft, non-tender, non-distended EXTREMITIES:  1+ pitting edema in bilateral legs; No deformity   ASSESSMENT AND PLAN: .    LE edema  Denies any LE edema, SOB, orthopnea.  However exam revealed 1+ bilateral pitting edema.  Order BNP, BMP, CBC. (He will return this week to complete in order to complete FLP at the same time).  If BNP significantly elevated then patient would prefer to adjust medications that he is already on.  Would recommend increasing spironolactone  and decreasing losartan . If no improvement then would consider adding Lasix . Also if BNP elevated then would order Echo.  Encouraged leg elevation, compression stocking and low salt diet.   S/P aortic valve replacement with allograft Continue ASA 81 mg.   Coronary artery disease involving native coronary artery of native heart with angina pectoris Hyperlipidemia LDL goal <70 Denies angina symptoms. No need for further ischemic evaluation at this time.  LDL at goal as below. Order FLP.  Continue ASA 81 mg daily, Lipitor  80 mg daily, NTG prn, Losartan  100 mg daily, Toprol  XL 50 mg daily Lipid Panel     Component Value Date/Time   CHOL 141 05/07/2023 0916   TRIG 119 05/07/2023 0916   HDL 38 (L) 05/07/2023 0916   CHOLHDL 3.7 05/07/2023 0916   VLDL 24 05/07/2023 0916   LDLCALC 79 05/07/2023 0916     Primary hypertension BP this OV well controlled today: 108/70 Order BMP.  Continue on Losartan  100 mg daily, Toprol  XL 50 mg daily,  Spironolactone  25 mg daily.   Encourage physical activity for 150 minutes per week and heart healthy low sodium diet. Discussed limiting sodium intake to < 2 grams daily.        Latest Ref Rng & Units 05/07/2023    9:16 AM 10/13/2021    2:11 PM 07/30/2021   12:19 PM  BMP  Glucose 70 - 99 mg/dL 895  857  854   BUN 8 - 23 mg/dL 26  30  26    Creatinine 0.61 - 1.24 mg/dL 8.95  8.75  8.88   Sodium 135 - 145 mmol/L 140  140  140   Potassium 3.5 - 5.1 mmol/L 4.0  3.8  3.8   Chloride 98 - 111 mmol/L 107  108  108   CO2 22 - 32 mmol/L 24  26  26    Calcium  8.9 - 10.3 mg/dL 8.9  8.5  8.6     Dizziness Recurrent dizziness beginning on standing in the morning and persisting throughout the day; similar prior episodes attributed to balance dysfunction that improved with balance rehabilitation, now recurrent. Negative Orthostatic vitals in  office today.  Encouraged to increase daily water  intake, slow position changes. Encouraged to follow-up with PCP to discuss referral for balance rehabilitation.     Dispo: Follow up in 6 months as requested by patient unless indicated sooner with Dr. Debera or any APP.   Signed, Lorette CINDERELLA Kapur, PA-C  "

## 2024-04-18 ENCOUNTER — Encounter: Payer: Self-pay | Admitting: Physician Assistant

## 2024-04-18 ENCOUNTER — Ambulatory Visit: Attending: Physician Assistant | Admitting: Physician Assistant

## 2024-04-18 VITALS — BP 108/70 | HR 87 | Ht 72.0 in | Wt 279.6 lb

## 2024-04-18 DIAGNOSIS — Z954 Presence of other heart-valve replacement: Secondary | ICD-10-CM | POA: Diagnosis not present

## 2024-04-18 DIAGNOSIS — I25119 Atherosclerotic heart disease of native coronary artery with unspecified angina pectoris: Secondary | ICD-10-CM

## 2024-04-18 DIAGNOSIS — E785 Hyperlipidemia, unspecified: Secondary | ICD-10-CM

## 2024-04-18 DIAGNOSIS — R609 Edema, unspecified: Secondary | ICD-10-CM | POA: Diagnosis not present

## 2024-04-18 DIAGNOSIS — Z79899 Other long term (current) drug therapy: Secondary | ICD-10-CM | POA: Diagnosis not present

## 2024-04-18 DIAGNOSIS — I10 Essential (primary) hypertension: Secondary | ICD-10-CM | POA: Diagnosis not present

## 2024-04-18 NOTE — Patient Instructions (Signed)
 Medication Instructions:  Your physician recommends that you continue on your current medications as directed. Please refer to the Current Medication list given to you today.  *If you need a refill on your cardiac medications before your next appointment, please call your pharmacy*  Lab Work: Your physician recommends that you return for lab work in: Fasting   Please have this done at [facility] (hours-Monday through Friday from 8:00 am to 4:00 pm except 11:30 am to 12:10 pm)  If you have labs (blood work) drawn today and your tests are completely normal, you will receive your results only by: MyChart Message (if you have MyChart) OR A paper copy in the mail If you have any lab test that is abnormal or we need to change your treatment, we will call you to review the results.  Testing/Procedures: NONE   Follow-Up: At [facility], you and your health needs are our priority.  As part of our continuing mission to provide you with exceptional heart care, our providers are all part of one team.  This team includes your primary Cardiologist (physician) and Advanced Practice Providers or APPs (Physician Assistants and Nurse Practitioners) who all work together to provide you with the care you need, when you need it.  Your next appointment:   6 month(s)  Provider:   You may see Jayson Sierras, MD or one of the following Advanced Practice Providers on your designated Care Team:   Laymon Qua, PA-C  [place], [place] Olivia [place]avy, [place]     We recommend signing up for the patient portal called MyChart.  Sign up information is provided on this After Visit Summary.  MyChart is used to connect with patients for Virtual Visits (Telemedicine).  Patients are able to view lab/test results, encounter notes, upcoming appointments, etc.  Non-urgent messages can be sent to your provider as well.   To learn more about what you can do with MyChart, go to [email or web address].    Other Instructions Thank you for choosing [facility]!

## 2024-04-19 ENCOUNTER — Other Ambulatory Visit: Payer: Self-pay

## 2024-04-19 ENCOUNTER — Ambulatory Visit: Payer: Self-pay | Admitting: Physician Assistant

## 2024-04-19 ENCOUNTER — Other Ambulatory Visit (HOSPITAL_COMMUNITY)
Admission: RE | Admit: 2024-04-19 | Discharge: 2024-04-19 | Disposition: A | Discharge status: Home / Self Care | Source: Ambulatory Visit | Attending: Physician Assistant | Admitting: Physician Assistant

## 2024-04-19 DIAGNOSIS — E785 Hyperlipidemia, unspecified: Secondary | ICD-10-CM | POA: Diagnosis present

## 2024-04-19 DIAGNOSIS — R600 Localized edema: Secondary | ICD-10-CM | POA: Diagnosis not present

## 2024-04-19 DIAGNOSIS — Z954 Presence of other heart-valve replacement: Secondary | ICD-10-CM | POA: Insufficient documentation

## 2024-04-19 DIAGNOSIS — R609 Edema, unspecified: Secondary | ICD-10-CM | POA: Insufficient documentation

## 2024-04-19 DIAGNOSIS — Z79899 Other long term (current) drug therapy: Secondary | ICD-10-CM | POA: Insufficient documentation

## 2024-04-19 DIAGNOSIS — I10 Essential (primary) hypertension: Secondary | ICD-10-CM | POA: Diagnosis not present

## 2024-04-19 LAB — LIPID PANEL
Cholesterol: 134 mg/dL (ref 0–200)
HDL: 42 mg/dL
LDL Cholesterol: 73 mg/dL (ref 0–99)
Total CHOL/HDL Ratio: 3.2 ratio
Triglycerides: 95 mg/dL
VLDL: 19 mg/dL (ref 0–40)

## 2024-04-19 LAB — CBC
HCT: 41.9 % (ref 39.0–52.0)
Hemoglobin: 13.2 g/dL (ref 13.0–17.0)
MCH: 27.2 pg (ref 26.0–34.0)
MCHC: 31.5 g/dL (ref 30.0–36.0)
MCV: 86.2 fL (ref 80.0–100.0)
Platelets: 201 K/uL (ref 150–400)
RBC: 4.86 MIL/uL (ref 4.22–5.81)
RDW: 14.4 % (ref 11.5–15.5)
WBC: 7.3 K/uL (ref 4.0–10.5)
nRBC: 0 % (ref 0.0–0.2)

## 2024-04-19 LAB — BASIC METABOLIC PANEL WITH GFR
Anion gap: 14 (ref 5–15)
BUN: 22 mg/dL (ref 8–23)
CO2: 21 mmol/L — ABNORMAL LOW (ref 22–32)
Calcium: 8.7 mg/dL — ABNORMAL LOW (ref 8.9–10.3)
Chloride: 107 mmol/L (ref 98–111)
Creatinine, Ser: 1.02 mg/dL (ref 0.61–1.24)
GFR, Estimated: >60 mL/min
Glucose, Bld: 100 mg/dL — ABNORMAL HIGH (ref 70–99)
Potassium: 3.9 mmol/L (ref 3.5–5.1)
Sodium: 141 mmol/L (ref 135–145)

## 2024-04-19 LAB — PRO BRAIN NATRIURETIC PEPTIDE: Pro Brain Natriuretic Peptide: 304 pg/mL — ABNORMAL HIGH

## 2024-04-19 NOTE — Telephone Encounter (Signed)
 Pt following up for results. Please advise.

## 2024-04-25 ENCOUNTER — Encounter: Payer: Self-pay | Admitting: *Deleted

## 2024-05-18 ENCOUNTER — Ambulatory Visit (HOSPITAL_COMMUNITY): Admission: RE | Admit: 2024-05-18 | Discharge: 2024-05-18 | Attending: Internal Medicine | Admitting: Internal Medicine

## 2024-05-18 DIAGNOSIS — R600 Localized edema: Secondary | ICD-10-CM

## 2024-05-18 LAB — ECHOCARDIOGRAM COMPLETE
AR max vel: 2.07 cm2
AV Area VTI: 2.5 cm2
AV Area mean vel: 2.1 cm2
AV Mean grad: 8 mmHg
AV Peak grad: 15.2 mmHg
Ao pk vel: 1.95 m/s
Area-P 1/2: 2.76 cm2
Calc EF: 55 %
S' Lateral: 3.2 cm
Single Plane A2C EF: 53.8 %
Single Plane A4C EF: 58.3 %

## 2024-05-18 NOTE — Progress Notes (Signed)
*  PRELIMINARY RESULTS* Echocardiogram 2D Echocardiogram has been performed.  Charles [name] 05/18/2024, 12:03 PM
